# Patient Record
Sex: Female | Born: 1940 | Race: White | Hispanic: No | Marital: Married | Smoking: Former smoker
Health system: Southern US, Community
[De-identification: ages and names within clinical notes are randomized; demographics above are authoritative.]

## PROBLEM LIST (undated history)

## (undated) DIAGNOSIS — A770 Spotted fever due to Rickettsia rickettsii: Secondary | ICD-10-CM

## (undated) DIAGNOSIS — R609 Edema, unspecified: Secondary | ICD-10-CM

## (undated) DIAGNOSIS — M8580 Other specified disorders of bone density and structure, unspecified site: Secondary | ICD-10-CM

## (undated) DIAGNOSIS — L570 Actinic keratosis: Secondary | ICD-10-CM

## (undated) DIAGNOSIS — F419 Anxiety disorder, unspecified: Secondary | ICD-10-CM

## (undated) DIAGNOSIS — M1990 Unspecified osteoarthritis, unspecified site: Secondary | ICD-10-CM

## (undated) DIAGNOSIS — R062 Wheezing: Secondary | ICD-10-CM

## (undated) DIAGNOSIS — Z972 Presence of dental prosthetic device (complete) (partial): Secondary | ICD-10-CM

## (undated) DIAGNOSIS — E559 Vitamin D deficiency, unspecified: Secondary | ICD-10-CM

## (undated) DIAGNOSIS — K219 Gastro-esophageal reflux disease without esophagitis: Secondary | ICD-10-CM

## (undated) DIAGNOSIS — C801 Malignant (primary) neoplasm, unspecified: Secondary | ICD-10-CM

## (undated) DIAGNOSIS — R011 Cardiac murmur, unspecified: Secondary | ICD-10-CM

## (undated) DIAGNOSIS — E876 Hypokalemia: Secondary | ICD-10-CM

## (undated) DIAGNOSIS — F32A Depression, unspecified: Secondary | ICD-10-CM

## (undated) DIAGNOSIS — T7840XA Allergy, unspecified, initial encounter: Secondary | ICD-10-CM

## (undated) DIAGNOSIS — N182 Chronic kidney disease, stage 2 (mild): Secondary | ICD-10-CM

## (undated) DIAGNOSIS — F329 Major depressive disorder, single episode, unspecified: Secondary | ICD-10-CM

## (undated) DIAGNOSIS — R809 Proteinuria, unspecified: Secondary | ICD-10-CM

## (undated) DIAGNOSIS — E785 Hyperlipidemia, unspecified: Secondary | ICD-10-CM

## (undated) DIAGNOSIS — I10 Essential (primary) hypertension: Secondary | ICD-10-CM

## (undated) DIAGNOSIS — R32 Unspecified urinary incontinence: Secondary | ICD-10-CM

## (undated) HISTORY — DX: Proteinuria, unspecified: R80.9

## (undated) HISTORY — DX: Anxiety disorder, unspecified: F41.9

## (undated) HISTORY — DX: Unspecified urinary incontinence: R32

## (undated) HISTORY — DX: Major depressive disorder, single episode, unspecified: F32.9

## (undated) HISTORY — DX: Unspecified osteoarthritis, unspecified site: M19.90

## (undated) HISTORY — DX: Spotted fever due to Rickettsia rickettsii: A77.0

## (undated) HISTORY — DX: Essential (primary) hypertension: I10

## (undated) HISTORY — PX: BLADDER SURGERY: SHX569

## (undated) HISTORY — DX: Vitamin D deficiency, unspecified: E55.9

## (undated) HISTORY — DX: Depression, unspecified: F32.A

## (undated) HISTORY — DX: Allergy, unspecified, initial encounter: T78.40XA

## (undated) HISTORY — DX: Chronic kidney disease, stage 2 (mild): N18.2

## (undated) HISTORY — DX: Hypokalemia: E87.6

## (undated) HISTORY — PX: COLONOSCOPY: SHX174

## (undated) HISTORY — DX: Actinic keratosis: L57.0

## (undated) HISTORY — DX: Hyperlipidemia, unspecified: E78.5

## (undated) HISTORY — DX: Other specified disorders of bone density and structure, unspecified site: M85.80

## (undated) HISTORY — PX: BUNIONECTOMY: SHX129

---

## 2002-08-22 HISTORY — PX: TOTAL ABDOMINAL HYSTERECTOMY: SHX209

## 2002-08-22 HISTORY — PX: ABDOMINAL HYSTERECTOMY: SHX81

## 2003-11-05 ENCOUNTER — Other Ambulatory Visit: Payer: Self-pay

## 2009-10-20 DIAGNOSIS — Z85828 Personal history of other malignant neoplasm of skin: Secondary | ICD-10-CM

## 2009-10-20 HISTORY — DX: Personal history of other malignant neoplasm of skin: Z85.828

## 2010-11-09 ENCOUNTER — Ambulatory Visit: Payer: Self-pay | Admitting: Family Medicine

## 2010-12-02 ENCOUNTER — Ambulatory Visit: Payer: Self-pay | Admitting: Nephrology

## 2011-10-06 DIAGNOSIS — F411 Generalized anxiety disorder: Secondary | ICD-10-CM | POA: Diagnosis not present

## 2011-10-06 DIAGNOSIS — F329 Major depressive disorder, single episode, unspecified: Secondary | ICD-10-CM | POA: Diagnosis not present

## 2011-10-06 DIAGNOSIS — I10 Essential (primary) hypertension: Secondary | ICD-10-CM | POA: Diagnosis not present

## 2011-12-13 DIAGNOSIS — E7800 Pure hypercholesterolemia, unspecified: Secondary | ICD-10-CM | POA: Diagnosis not present

## 2011-12-13 DIAGNOSIS — E119 Type 2 diabetes mellitus without complications: Secondary | ICD-10-CM | POA: Diagnosis not present

## 2011-12-13 DIAGNOSIS — I10 Essential (primary) hypertension: Secondary | ICD-10-CM | POA: Diagnosis not present

## 2011-12-13 DIAGNOSIS — F329 Major depressive disorder, single episode, unspecified: Secondary | ICD-10-CM | POA: Diagnosis not present

## 2011-12-13 DIAGNOSIS — Z0000 Encounter for general adult medical examination without abnormal findings: Secondary | ICD-10-CM | POA: Diagnosis not present

## 2011-12-13 DIAGNOSIS — J309 Allergic rhinitis, unspecified: Secondary | ICD-10-CM | POA: Diagnosis not present

## 2011-12-13 DIAGNOSIS — Z1331 Encounter for screening for depression: Secondary | ICD-10-CM | POA: Diagnosis not present

## 2011-12-16 DIAGNOSIS — M7750 Other enthesopathy of unspecified foot: Secondary | ICD-10-CM | POA: Diagnosis not present

## 2011-12-26 DIAGNOSIS — Z79899 Other long term (current) drug therapy: Secondary | ICD-10-CM | POA: Diagnosis not present

## 2011-12-26 DIAGNOSIS — Z1231 Encounter for screening mammogram for malignant neoplasm of breast: Secondary | ICD-10-CM | POA: Diagnosis not present

## 2011-12-27 DIAGNOSIS — I10 Essential (primary) hypertension: Secondary | ICD-10-CM | POA: Diagnosis not present

## 2012-01-30 DIAGNOSIS — E785 Hyperlipidemia, unspecified: Secondary | ICD-10-CM | POA: Diagnosis not present

## 2012-01-30 DIAGNOSIS — I10 Essential (primary) hypertension: Secondary | ICD-10-CM | POA: Diagnosis not present

## 2012-02-13 DIAGNOSIS — M899 Disorder of bone, unspecified: Secondary | ICD-10-CM | POA: Diagnosis not present

## 2012-02-13 DIAGNOSIS — M999 Biomechanical lesion, unspecified: Secondary | ICD-10-CM | POA: Diagnosis not present

## 2012-02-13 DIAGNOSIS — M949 Disorder of cartilage, unspecified: Secondary | ICD-10-CM | POA: Diagnosis not present

## 2012-02-20 DIAGNOSIS — L259 Unspecified contact dermatitis, unspecified cause: Secondary | ICD-10-CM | POA: Diagnosis not present

## 2012-02-20 DIAGNOSIS — E876 Hypokalemia: Secondary | ICD-10-CM | POA: Diagnosis not present

## 2012-02-21 DIAGNOSIS — H2510 Age-related nuclear cataract, unspecified eye: Secondary | ICD-10-CM | POA: Diagnosis not present

## 2012-04-13 DIAGNOSIS — M7750 Other enthesopathy of unspecified foot: Secondary | ICD-10-CM | POA: Diagnosis not present

## 2012-06-26 DIAGNOSIS — M7750 Other enthesopathy of unspecified foot: Secondary | ICD-10-CM | POA: Diagnosis not present

## 2012-06-28 DIAGNOSIS — R011 Cardiac murmur, unspecified: Secondary | ICD-10-CM | POA: Diagnosis not present

## 2012-07-04 DIAGNOSIS — J029 Acute pharyngitis, unspecified: Secondary | ICD-10-CM | POA: Diagnosis not present

## 2012-07-11 DIAGNOSIS — Z23 Encounter for immunization: Secondary | ICD-10-CM | POA: Diagnosis not present

## 2012-08-31 DIAGNOSIS — M7750 Other enthesopathy of unspecified foot: Secondary | ICD-10-CM | POA: Diagnosis not present

## 2012-10-15 DIAGNOSIS — E785 Hyperlipidemia, unspecified: Secondary | ICD-10-CM | POA: Diagnosis not present

## 2012-10-16 ENCOUNTER — Ambulatory Visit: Payer: Self-pay | Admitting: Family Medicine

## 2012-10-16 DIAGNOSIS — M23302 Other meniscus derangements, unspecified lateral meniscus, unspecified knee: Secondary | ICD-10-CM | POA: Diagnosis not present

## 2012-10-16 DIAGNOSIS — M25569 Pain in unspecified knee: Secondary | ICD-10-CM | POA: Diagnosis not present

## 2012-10-16 DIAGNOSIS — M25669 Stiffness of unspecified knee, not elsewhere classified: Secondary | ICD-10-CM | POA: Diagnosis not present

## 2012-10-16 DIAGNOSIS — M1710 Unilateral primary osteoarthritis, unspecified knee: Secondary | ICD-10-CM | POA: Diagnosis not present

## 2012-10-16 DIAGNOSIS — IMO0002 Reserved for concepts with insufficient information to code with codable children: Secondary | ICD-10-CM | POA: Diagnosis not present

## 2012-11-08 DIAGNOSIS — M1710 Unilateral primary osteoarthritis, unspecified knee: Secondary | ICD-10-CM | POA: Diagnosis not present

## 2012-12-11 DIAGNOSIS — B370 Candidal stomatitis: Secondary | ICD-10-CM | POA: Diagnosis not present

## 2012-12-11 DIAGNOSIS — K140 Glossitis: Secondary | ICD-10-CM | POA: Diagnosis not present

## 2012-12-21 DIAGNOSIS — M779 Enthesopathy, unspecified: Secondary | ICD-10-CM | POA: Diagnosis not present

## 2012-12-21 DIAGNOSIS — M2040 Other hammer toe(s) (acquired), unspecified foot: Secondary | ICD-10-CM | POA: Diagnosis not present

## 2013-01-15 DIAGNOSIS — E785 Hyperlipidemia, unspecified: Secondary | ICD-10-CM | POA: Diagnosis not present

## 2013-01-15 DIAGNOSIS — R5381 Other malaise: Secondary | ICD-10-CM | POA: Diagnosis not present

## 2013-01-15 DIAGNOSIS — Z0000 Encounter for general adult medical examination without abnormal findings: Secondary | ICD-10-CM | POA: Diagnosis not present

## 2013-01-15 DIAGNOSIS — S60949A Unspecified superficial injury of unspecified finger, initial encounter: Secondary | ICD-10-CM | POA: Diagnosis not present

## 2013-01-15 DIAGNOSIS — R5383 Other fatigue: Secondary | ICD-10-CM | POA: Diagnosis not present

## 2013-01-15 DIAGNOSIS — I10 Essential (primary) hypertension: Secondary | ICD-10-CM | POA: Diagnosis not present

## 2013-02-05 DIAGNOSIS — M659 Synovitis and tenosynovitis, unspecified: Secondary | ICD-10-CM | POA: Diagnosis not present

## 2013-02-05 DIAGNOSIS — R609 Edema, unspecified: Secondary | ICD-10-CM | POA: Diagnosis not present

## 2013-02-05 DIAGNOSIS — M79609 Pain in unspecified limb: Secondary | ICD-10-CM | POA: Diagnosis not present

## 2013-04-02 DIAGNOSIS — M1710 Unilateral primary osteoarthritis, unspecified knee: Secondary | ICD-10-CM | POA: Diagnosis not present

## 2013-04-02 DIAGNOSIS — IMO0002 Reserved for concepts with insufficient information to code with codable children: Secondary | ICD-10-CM | POA: Diagnosis not present

## 2013-04-02 DIAGNOSIS — M79609 Pain in unspecified limb: Secondary | ICD-10-CM | POA: Diagnosis not present

## 2013-05-24 DIAGNOSIS — IMO0002 Reserved for concepts with insufficient information to code with codable children: Secondary | ICD-10-CM | POA: Diagnosis not present

## 2013-05-24 DIAGNOSIS — M79609 Pain in unspecified limb: Secondary | ICD-10-CM | POA: Diagnosis not present

## 2013-05-24 DIAGNOSIS — M1710 Unilateral primary osteoarthritis, unspecified knee: Secondary | ICD-10-CM | POA: Diagnosis not present

## 2013-08-12 DIAGNOSIS — J111 Influenza due to unidentified influenza virus with other respiratory manifestations: Secondary | ICD-10-CM | POA: Diagnosis not present

## 2013-08-12 DIAGNOSIS — J40 Bronchitis, not specified as acute or chronic: Secondary | ICD-10-CM | POA: Diagnosis not present

## 2013-08-12 DIAGNOSIS — J209 Acute bronchitis, unspecified: Secondary | ICD-10-CM | POA: Diagnosis not present

## 2013-09-03 DIAGNOSIS — J209 Acute bronchitis, unspecified: Secondary | ICD-10-CM | POA: Diagnosis not present

## 2013-09-03 DIAGNOSIS — J019 Acute sinusitis, unspecified: Secondary | ICD-10-CM | POA: Diagnosis not present

## 2013-09-10 DIAGNOSIS — N8111 Cystocele, midline: Secondary | ICD-10-CM | POA: Diagnosis not present

## 2013-09-10 DIAGNOSIS — R300 Dysuria: Secondary | ICD-10-CM | POA: Diagnosis not present

## 2013-09-10 DIAGNOSIS — R32 Unspecified urinary incontinence: Secondary | ICD-10-CM | POA: Diagnosis not present

## 2013-09-10 DIAGNOSIS — R634 Abnormal weight loss: Secondary | ICD-10-CM | POA: Diagnosis not present

## 2013-09-13 DIAGNOSIS — R634 Abnormal weight loss: Secondary | ICD-10-CM | POA: Diagnosis not present

## 2013-09-13 DIAGNOSIS — K644 Residual hemorrhoidal skin tags: Secondary | ICD-10-CM | POA: Diagnosis not present

## 2013-09-13 DIAGNOSIS — K5909 Other constipation: Secondary | ICD-10-CM | POA: Diagnosis not present

## 2013-09-17 ENCOUNTER — Ambulatory Visit: Payer: Self-pay | Admitting: Family Medicine

## 2013-09-17 DIAGNOSIS — IMO0002 Reserved for concepts with insufficient information to code with codable children: Secondary | ICD-10-CM | POA: Diagnosis not present

## 2013-09-17 DIAGNOSIS — M1710 Unilateral primary osteoarthritis, unspecified knee: Secondary | ICD-10-CM | POA: Diagnosis not present

## 2013-09-17 DIAGNOSIS — R932 Abnormal findings on diagnostic imaging of liver and biliary tract: Secondary | ICD-10-CM | POA: Diagnosis not present

## 2013-09-17 DIAGNOSIS — R5383 Other fatigue: Secondary | ICD-10-CM | POA: Diagnosis not present

## 2013-09-17 DIAGNOSIS — Z20828 Contact with and (suspected) exposure to other viral communicable diseases: Secondary | ICD-10-CM | POA: Diagnosis not present

## 2013-09-17 DIAGNOSIS — R634 Abnormal weight loss: Secondary | ICD-10-CM | POA: Diagnosis not present

## 2013-09-17 DIAGNOSIS — R5381 Other malaise: Secondary | ICD-10-CM | POA: Diagnosis not present

## 2013-09-18 DIAGNOSIS — N393 Stress incontinence (female) (male): Secondary | ICD-10-CM | POA: Diagnosis not present

## 2013-09-18 DIAGNOSIS — N8111 Cystocele, midline: Secondary | ICD-10-CM | POA: Diagnosis not present

## 2013-09-19 ENCOUNTER — Ambulatory Visit: Payer: Self-pay | Admitting: Family Medicine

## 2013-09-19 DIAGNOSIS — K7689 Other specified diseases of liver: Secondary | ICD-10-CM | POA: Diagnosis not present

## 2013-09-19 DIAGNOSIS — R634 Abnormal weight loss: Secondary | ICD-10-CM | POA: Diagnosis not present

## 2013-09-26 DIAGNOSIS — N816 Rectocele: Secondary | ICD-10-CM | POA: Diagnosis not present

## 2013-09-26 DIAGNOSIS — M79609 Pain in unspecified limb: Secondary | ICD-10-CM | POA: Diagnosis not present

## 2013-09-26 DIAGNOSIS — M659 Synovitis and tenosynovitis, unspecified: Secondary | ICD-10-CM | POA: Diagnosis not present

## 2013-09-26 DIAGNOSIS — N8110 Cystocele, unspecified: Secondary | ICD-10-CM | POA: Diagnosis not present

## 2013-11-12 DIAGNOSIS — E785 Hyperlipidemia, unspecified: Secondary | ICD-10-CM | POA: Diagnosis not present

## 2013-11-12 DIAGNOSIS — R634 Abnormal weight loss: Secondary | ICD-10-CM | POA: Diagnosis not present

## 2013-11-12 DIAGNOSIS — I10 Essential (primary) hypertension: Secondary | ICD-10-CM | POA: Diagnosis not present

## 2013-11-28 DIAGNOSIS — H2510 Age-related nuclear cataract, unspecified eye: Secondary | ICD-10-CM | POA: Diagnosis not present

## 2013-12-18 DIAGNOSIS — I10 Essential (primary) hypertension: Secondary | ICD-10-CM | POA: Diagnosis not present

## 2013-12-18 DIAGNOSIS — E785 Hyperlipidemia, unspecified: Secondary | ICD-10-CM | POA: Diagnosis not present

## 2013-12-26 DIAGNOSIS — M2010 Hallux valgus (acquired), unspecified foot: Secondary | ICD-10-CM | POA: Diagnosis not present

## 2013-12-26 DIAGNOSIS — M21619 Bunion of unspecified foot: Secondary | ICD-10-CM | POA: Diagnosis not present

## 2014-01-21 DIAGNOSIS — M21619 Bunion of unspecified foot: Secondary | ICD-10-CM | POA: Diagnosis not present

## 2014-01-21 DIAGNOSIS — M2010 Hallux valgus (acquired), unspecified foot: Secondary | ICD-10-CM | POA: Diagnosis not present

## 2014-01-27 DIAGNOSIS — N182 Chronic kidney disease, stage 2 (mild): Secondary | ICD-10-CM | POA: Diagnosis not present

## 2014-01-27 DIAGNOSIS — Z823 Family history of stroke: Secondary | ICD-10-CM | POA: Diagnosis not present

## 2014-01-27 DIAGNOSIS — M2010 Hallux valgus (acquired), unspecified foot: Secondary | ICD-10-CM | POA: Diagnosis not present

## 2014-01-27 DIAGNOSIS — T148XXA Other injury of unspecified body region, initial encounter: Secondary | ICD-10-CM | POA: Diagnosis not present

## 2014-01-29 ENCOUNTER — Ambulatory Visit: Payer: Self-pay | Admitting: Podiatry

## 2014-01-29 DIAGNOSIS — M2010 Hallux valgus (acquired), unspecified foot: Secondary | ICD-10-CM | POA: Diagnosis not present

## 2014-01-29 DIAGNOSIS — M21619 Bunion of unspecified foot: Secondary | ICD-10-CM | POA: Diagnosis not present

## 2014-01-29 DIAGNOSIS — G8918 Other acute postprocedural pain: Secondary | ICD-10-CM | POA: Diagnosis not present

## 2014-01-29 DIAGNOSIS — Q666 Other congenital valgus deformities of feet: Secondary | ICD-10-CM | POA: Diagnosis not present

## 2014-01-29 DIAGNOSIS — M1990 Unspecified osteoarthritis, unspecified site: Secondary | ICD-10-CM | POA: Diagnosis not present

## 2014-01-29 DIAGNOSIS — I10 Essential (primary) hypertension: Secondary | ICD-10-CM | POA: Diagnosis not present

## 2014-01-29 DIAGNOSIS — Z79899 Other long term (current) drug therapy: Secondary | ICD-10-CM | POA: Diagnosis not present

## 2014-01-29 DIAGNOSIS — Z791 Long term (current) use of non-steroidal anti-inflammatories (NSAID): Secondary | ICD-10-CM | POA: Diagnosis not present

## 2014-02-04 DIAGNOSIS — M79609 Pain in unspecified limb: Secondary | ICD-10-CM | POA: Diagnosis not present

## 2014-02-20 DIAGNOSIS — M79609 Pain in unspecified limb: Secondary | ICD-10-CM | POA: Diagnosis not present

## 2014-03-13 DIAGNOSIS — R609 Edema, unspecified: Secondary | ICD-10-CM | POA: Diagnosis not present

## 2014-03-13 DIAGNOSIS — L259 Unspecified contact dermatitis, unspecified cause: Secondary | ICD-10-CM | POA: Diagnosis not present

## 2014-03-13 DIAGNOSIS — M79609 Pain in unspecified limb: Secondary | ICD-10-CM | POA: Diagnosis not present

## 2014-03-13 DIAGNOSIS — S92309A Fracture of unspecified metatarsal bone(s), unspecified foot, initial encounter for closed fracture: Secondary | ICD-10-CM | POA: Diagnosis not present

## 2014-04-22 ENCOUNTER — Ambulatory Visit: Payer: Self-pay | Admitting: Podiatry

## 2014-04-24 DIAGNOSIS — M899 Disorder of bone, unspecified: Secondary | ICD-10-CM | POA: Diagnosis not present

## 2014-04-24 DIAGNOSIS — N951 Menopausal and female climacteric states: Secondary | ICD-10-CM | POA: Diagnosis not present

## 2014-04-24 DIAGNOSIS — Z13820 Encounter for screening for osteoporosis: Secondary | ICD-10-CM | POA: Diagnosis not present

## 2014-04-24 DIAGNOSIS — M949 Disorder of cartilage, unspecified: Secondary | ICD-10-CM | POA: Diagnosis not present

## 2014-04-24 DIAGNOSIS — E2839 Other primary ovarian failure: Secondary | ICD-10-CM | POA: Diagnosis not present

## 2014-04-25 ENCOUNTER — Ambulatory Visit (INDEPENDENT_AMBULATORY_CARE_PROVIDER_SITE_OTHER): Payer: Medicare Other

## 2014-04-25 ENCOUNTER — Other Ambulatory Visit: Payer: Self-pay | Admitting: *Deleted

## 2014-04-25 ENCOUNTER — Encounter: Payer: Self-pay | Admitting: Podiatry

## 2014-04-25 ENCOUNTER — Ambulatory Visit (INDEPENDENT_AMBULATORY_CARE_PROVIDER_SITE_OTHER): Payer: Medicare Other | Admitting: Podiatry

## 2014-04-25 VITALS — BP 133/79 | HR 79 | Resp 16 | Ht 61.0 in | Wt 140.0 lb

## 2014-04-25 DIAGNOSIS — M21619 Bunion of unspecified foot: Secondary | ICD-10-CM

## 2014-04-25 DIAGNOSIS — R609 Edema, unspecified: Secondary | ICD-10-CM | POA: Diagnosis not present

## 2014-04-25 DIAGNOSIS — S92309A Fracture of unspecified metatarsal bone(s), unspecified foot, initial encounter for closed fracture: Secondary | ICD-10-CM

## 2014-04-25 DIAGNOSIS — M21612 Bunion of left foot: Secondary | ICD-10-CM

## 2014-04-25 NOTE — Progress Notes (Signed)
   Subjective:    Patient ID: Cynthia Vaughan, female    DOB: October 23, 1940, 73 y.o.   MRN: [id number]  HPI Comments: Had bunion surgery about 3 months ago with Dr Vickki Muff and it dont look so well now   Foot Pain      Review of Systems     Objective:   Physical Exam        Assessment & Plan:

## 2014-04-28 NOTE — Progress Notes (Signed)
Subjective:     Patient ID: Cynthia Vaughan, female   DOB: February 16, 1941, 73 y.o.   MRN: [id number]  HPI patient presents with pain in the left foot that has been chronic in nature and has been going on since she had foot surgery 3 months ago by another physician. States that it makes it very hard for her to walk and she feels like she's walking on a broken down   Review of Systems     Objective:   Physical Exam Neurovascular status is unchanged with quite a bit of forefoot edema +2 pitting in nature of the left forefoot and ankle with severe discomfort in the lateral foot and indications of surgery on the first and fifth metatarsal    Assessment:     Probable fracture of the fourth metatarsal left with healing still occurring on the lateral foot    Plan:     H&P and x-rays reviewed. At this time due to the severity of the fracture fourth metatarsal immobilization is necessary and air fracture walker was dispensed. Due to the significant pitting edema I did apply an Haematologist Ace wrap with instructions on wearing it for 4 days and less it bothers her and to reappoint for me to recheck again in 3 weeks earlier if any issues should occur

## 2014-05-16 DIAGNOSIS — J069 Acute upper respiratory infection, unspecified: Secondary | ICD-10-CM | POA: Diagnosis not present

## 2014-05-23 ENCOUNTER — Ambulatory Visit (INDEPENDENT_AMBULATORY_CARE_PROVIDER_SITE_OTHER): Payer: Medicare Other

## 2014-05-23 ENCOUNTER — Encounter: Payer: Self-pay | Admitting: Podiatry

## 2014-05-23 ENCOUNTER — Ambulatory Visit (INDEPENDENT_AMBULATORY_CARE_PROVIDER_SITE_OTHER): Payer: Medicare Other | Admitting: Podiatry

## 2014-05-23 DIAGNOSIS — S92302D Fracture of unspecified metatarsal bone(s), left foot, subsequent encounter for fracture with routine healing: Secondary | ICD-10-CM | POA: Diagnosis not present

## 2014-05-25 NOTE — Progress Notes (Signed)
Subjective:     Patient ID: Cynthia Vaughan, female   DOB: 04-26-1941, 73 y.o.   MRN: [id number]  HPI patient states that my pain has reduced quite nicely on my left and I have still been wearing my boot most of the time   Review of Systems     Objective:   Physical Exam Neurovascular status intact with continued discomfort left fourth metatarsal shaft but improved from previous visit    Assessment:     Improved stress fracture condition left fourth metatarsal    Plan:     Discussed that this was a severe fracture and will take probably another 8-12 weeks to completely heal but I want her to use partial immobilization and don't do any activities that put excessive force on her forefoot

## 2014-06-08 ENCOUNTER — Emergency Department: Payer: Self-pay | Admitting: Emergency Medicine

## 2014-06-08 DIAGNOSIS — F419 Anxiety disorder, unspecified: Secondary | ICD-10-CM | POA: Diagnosis not present

## 2014-06-08 DIAGNOSIS — R209 Unspecified disturbances of skin sensation: Secondary | ICD-10-CM | POA: Diagnosis not present

## 2014-06-08 DIAGNOSIS — R52 Pain, unspecified: Secondary | ICD-10-CM | POA: Diagnosis not present

## 2014-06-08 DIAGNOSIS — R9431 Abnormal electrocardiogram [ECG] [EKG]: Secondary | ICD-10-CM | POA: Diagnosis not present

## 2014-06-08 DIAGNOSIS — R51 Headache: Secondary | ICD-10-CM | POA: Diagnosis not present

## 2014-06-08 LAB — URINALYSIS, COMPLETE
Bacteria: NONE SEEN
Bilirubin,UR: NEGATIVE
Blood: NEGATIVE
Glucose,UR: NEGATIVE mg/dL (ref 0–75)
KETONE: NEGATIVE
Leukocyte Esterase: NEGATIVE
NITRITE: NEGATIVE
PH: 7 (ref 4.5–8.0)
Protein: NEGATIVE
Specific Gravity: 1.005 (ref 1.003–1.030)

## 2014-06-08 LAB — COMPREHENSIVE METABOLIC PANEL
ALBUMIN: 3.2 g/dL — AB (ref 3.4–5.0)
ALK PHOS: 75 U/L
Anion Gap: 9 (ref 7–16)
BUN: 10 mg/dL (ref 7–18)
Bilirubin,Total: 0.3 mg/dL (ref 0.2–1.0)
CALCIUM: 8.2 mg/dL — AB (ref 8.5–10.1)
CO2: 26 mmol/L (ref 21–32)
Chloride: 107 mmol/L (ref 98–107)
Creatinine: 0.73 mg/dL (ref 0.60–1.30)
EGFR (Non-African Amer.): >60
Glucose: 84 mg/dL (ref 65–99)
Osmolality: 281 (ref 275–301)
POTASSIUM: 3.6 mmol/L (ref 3.5–5.1)
SGOT(AST): 31 U/L (ref 15–37)
SGPT (ALT): 38 U/L
SODIUM: 142 mmol/L (ref 136–145)
TOTAL PROTEIN: 6.8 g/dL (ref 6.4–8.2)

## 2014-06-08 LAB — CBC
HCT: 41 % (ref 35.0–47.0)
HGB: 13.8 g/dL (ref 12.0–16.0)
MCH: 32 pg (ref 26.0–34.0)
MCHC: 33.7 g/dL (ref 32.0–36.0)
MCV: 95 fL (ref 80–100)
Platelet: 193 10*3/uL (ref 150–440)
RBC: 4.33 10*6/uL (ref 3.80–5.20)
RDW: 12.9 % (ref 11.5–14.5)
WBC: 7.3 10*3/uL (ref 3.6–11.0)

## 2014-06-08 LAB — TROPONIN I

## 2014-06-11 DIAGNOSIS — M79672 Pain in left foot: Secondary | ICD-10-CM | POA: Diagnosis not present

## 2014-06-11 DIAGNOSIS — Z23 Encounter for immunization: Secondary | ICD-10-CM | POA: Diagnosis not present

## 2014-06-11 DIAGNOSIS — F411 Generalized anxiety disorder: Secondary | ICD-10-CM | POA: Diagnosis not present

## 2014-09-29 DIAGNOSIS — E785 Hyperlipidemia, unspecified: Secondary | ICD-10-CM | POA: Diagnosis not present

## 2014-09-29 DIAGNOSIS — N182 Chronic kidney disease, stage 2 (mild): Secondary | ICD-10-CM | POA: Diagnosis not present

## 2014-09-29 DIAGNOSIS — E559 Vitamin D deficiency, unspecified: Secondary | ICD-10-CM | POA: Diagnosis not present

## 2014-09-29 DIAGNOSIS — I129 Hypertensive chronic kidney disease with stage 1 through stage 4 chronic kidney disease, or unspecified chronic kidney disease: Secondary | ICD-10-CM | POA: Diagnosis not present

## 2014-09-29 DIAGNOSIS — G629 Polyneuropathy, unspecified: Secondary | ICD-10-CM | POA: Diagnosis not present

## 2014-09-29 DIAGNOSIS — M1990 Unspecified osteoarthritis, unspecified site: Secondary | ICD-10-CM | POA: Diagnosis not present

## 2014-09-29 DIAGNOSIS — E538 Deficiency of other specified B group vitamins: Secondary | ICD-10-CM | POA: Diagnosis not present

## 2014-10-21 DIAGNOSIS — R202 Paresthesia of skin: Secondary | ICD-10-CM | POA: Insufficient documentation

## 2014-11-13 DIAGNOSIS — J029 Acute pharyngitis, unspecified: Secondary | ICD-10-CM | POA: Diagnosis not present

## 2014-11-13 DIAGNOSIS — J069 Acute upper respiratory infection, unspecified: Secondary | ICD-10-CM | POA: Diagnosis not present

## 2014-12-29 DIAGNOSIS — I10 Essential (primary) hypertension: Secondary | ICD-10-CM | POA: Diagnosis not present

## 2014-12-29 DIAGNOSIS — E559 Vitamin D deficiency, unspecified: Secondary | ICD-10-CM | POA: Diagnosis not present

## 2014-12-29 DIAGNOSIS — E041 Nontoxic single thyroid nodule: Secondary | ICD-10-CM | POA: Diagnosis not present

## 2014-12-29 DIAGNOSIS — J020 Streptococcal pharyngitis: Secondary | ICD-10-CM | POA: Diagnosis not present

## 2014-12-29 DIAGNOSIS — N182 Chronic kidney disease, stage 2 (mild): Secondary | ICD-10-CM | POA: Diagnosis not present

## 2014-12-29 DIAGNOSIS — R801 Persistent proteinuria, unspecified: Secondary | ICD-10-CM | POA: Diagnosis not present

## 2014-12-29 DIAGNOSIS — R946 Abnormal results of thyroid function studies: Secondary | ICD-10-CM | POA: Diagnosis not present

## 2014-12-30 ENCOUNTER — Other Ambulatory Visit: Payer: Self-pay | Admitting: Family Medicine

## 2014-12-30 DIAGNOSIS — R7989 Other specified abnormal findings of blood chemistry: Secondary | ICD-10-CM

## 2014-12-30 DIAGNOSIS — R1310 Dysphagia, unspecified: Secondary | ICD-10-CM

## 2015-01-01 ENCOUNTER — Ambulatory Visit: Payer: PRIVATE HEALTH INSURANCE

## 2015-01-02 ENCOUNTER — Other Ambulatory Visit: Payer: Self-pay | Admitting: Family Medicine

## 2015-01-02 ENCOUNTER — Ambulatory Visit
Admission: RE | Admit: 2015-01-02 | Discharge: 2015-01-02 | Disposition: A | Discharge status: Home / Self Care | Payer: Medicare Other | Source: Ambulatory Visit | Attending: Family Medicine | Admitting: Family Medicine

## 2015-01-02 ENCOUNTER — Ambulatory Visit: Payer: Medicare Other

## 2015-01-02 DIAGNOSIS — R600 Localized edema: Secondary | ICD-10-CM | POA: Diagnosis not present

## 2015-01-02 DIAGNOSIS — I509 Heart failure, unspecified: Secondary | ICD-10-CM | POA: Diagnosis not present

## 2015-01-02 DIAGNOSIS — R0602 Shortness of breath: Secondary | ICD-10-CM

## 2015-01-06 ENCOUNTER — Other Ambulatory Visit: Payer: Self-pay | Admitting: Family Medicine

## 2015-01-06 DIAGNOSIS — R1310 Dysphagia, unspecified: Secondary | ICD-10-CM

## 2015-01-06 DIAGNOSIS — R7989 Other specified abnormal findings of blood chemistry: Secondary | ICD-10-CM

## 2015-01-06 DIAGNOSIS — R221 Localized swelling, mass and lump, neck: Secondary | ICD-10-CM

## 2015-01-07 ENCOUNTER — Inpatient Hospital Stay: Admission: RE | Admit: 2015-01-07 | Payer: PRIVATE HEALTH INSURANCE | Source: Ambulatory Visit

## 2015-01-07 ENCOUNTER — Ambulatory Visit: Admission: RE | Admit: 2015-01-07 | Payer: PRIVATE HEALTH INSURANCE | Source: Ambulatory Visit

## 2015-01-20 DIAGNOSIS — E785 Hyperlipidemia, unspecified: Secondary | ICD-10-CM | POA: Diagnosis not present

## 2015-01-20 DIAGNOSIS — M7989 Other specified soft tissue disorders: Secondary | ICD-10-CM | POA: Diagnosis not present

## 2015-01-20 DIAGNOSIS — I10 Essential (primary) hypertension: Secondary | ICD-10-CM | POA: Diagnosis not present

## 2015-01-20 DIAGNOSIS — M79609 Pain in unspecified limb: Secondary | ICD-10-CM | POA: Diagnosis not present

## 2015-01-22 ENCOUNTER — Other Ambulatory Visit: Payer: Self-pay

## 2015-01-22 MED ORDER — ATORVASTATIN CALCIUM 40 MG PO TABS: 40.0000 mg | ORAL_TABLET | Freq: Every day | ORAL | Status: DC

## 2015-01-22 NOTE — Telephone Encounter (Signed)
Last SGPT was 19 on Sep 29, 2014

## 2015-02-19 ENCOUNTER — Other Ambulatory Visit: Payer: Self-pay | Admitting: Family Medicine

## 2015-02-19 MED ORDER — LOSARTAN POTASSIUM 50 MG PO TABS: 50.0000 mg | ORAL_TABLET | Freq: Every day | ORAL | Status: DC

## 2015-02-19 NOTE — Telephone Encounter (Signed)
E-Fax came through for refill: Rx: losartan (COZAAR) 25 MG tablet Rx copy on basket

## 2015-02-19 NOTE — Telephone Encounter (Signed)
Routing to provider  

## 2015-03-03 DIAGNOSIS — F329 Major depressive disorder, single episode, unspecified: Secondary | ICD-10-CM | POA: Insufficient documentation

## 2015-03-03 DIAGNOSIS — E559 Vitamin D deficiency, unspecified: Secondary | ICD-10-CM | POA: Insufficient documentation

## 2015-03-03 DIAGNOSIS — I129 Hypertensive chronic kidney disease with stage 1 through stage 4 chronic kidney disease, or unspecified chronic kidney disease: Secondary | ICD-10-CM | POA: Insufficient documentation

## 2015-03-03 DIAGNOSIS — E785 Hyperlipidemia, unspecified: Secondary | ICD-10-CM | POA: Insufficient documentation

## 2015-03-03 DIAGNOSIS — N182 Chronic kidney disease, stage 2 (mild): Secondary | ICD-10-CM | POA: Insufficient documentation

## 2015-03-03 DIAGNOSIS — R32 Unspecified urinary incontinence: Secondary | ICD-10-CM | POA: Insufficient documentation

## 2015-03-03 DIAGNOSIS — F32A Depression, unspecified: Secondary | ICD-10-CM | POA: Insufficient documentation

## 2015-03-03 DIAGNOSIS — F419 Anxiety disorder, unspecified: Secondary | ICD-10-CM | POA: Insufficient documentation

## 2015-03-03 DIAGNOSIS — M1990 Unspecified osteoarthritis, unspecified site: Secondary | ICD-10-CM | POA: Insufficient documentation

## 2015-03-03 DIAGNOSIS — M8580 Other specified disorders of bone density and structure, unspecified site: Secondary | ICD-10-CM | POA: Insufficient documentation

## 2015-03-03 DIAGNOSIS — R809 Proteinuria, unspecified: Secondary | ICD-10-CM | POA: Insufficient documentation

## 2015-03-03 DIAGNOSIS — T7840XA Allergy, unspecified, initial encounter: Secondary | ICD-10-CM | POA: Insufficient documentation

## 2015-03-03 DIAGNOSIS — E876 Hypokalemia: Secondary | ICD-10-CM | POA: Insufficient documentation

## 2015-03-04 IMAGING — CR DG CHEST 2V
1 series · 2 of 2 positions shown · non-contrast
Comparison: None.

CLINICAL DATA: Weight loss.

EXAM:
CHEST  2 VIEW

[Series 1: pa · 0.17mm/px · 2 of 2 slices shown]
[im 1/2]
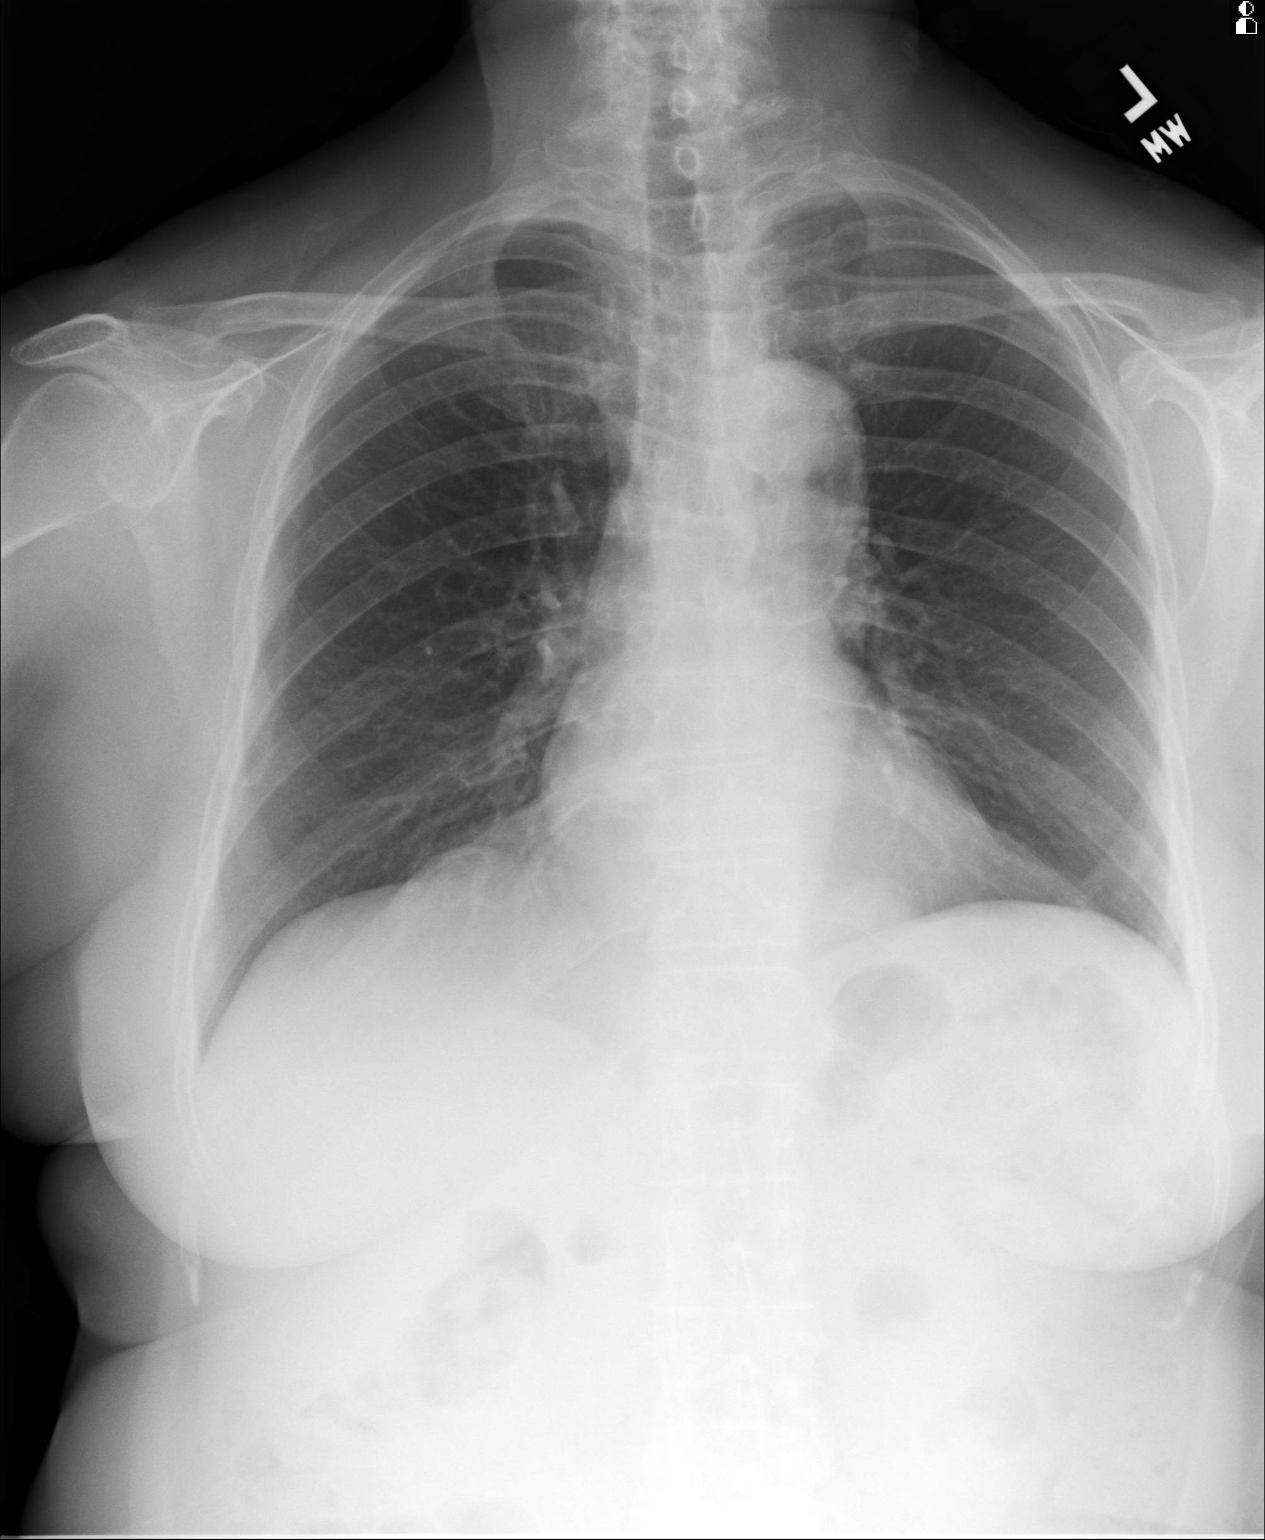
[im 2/2]
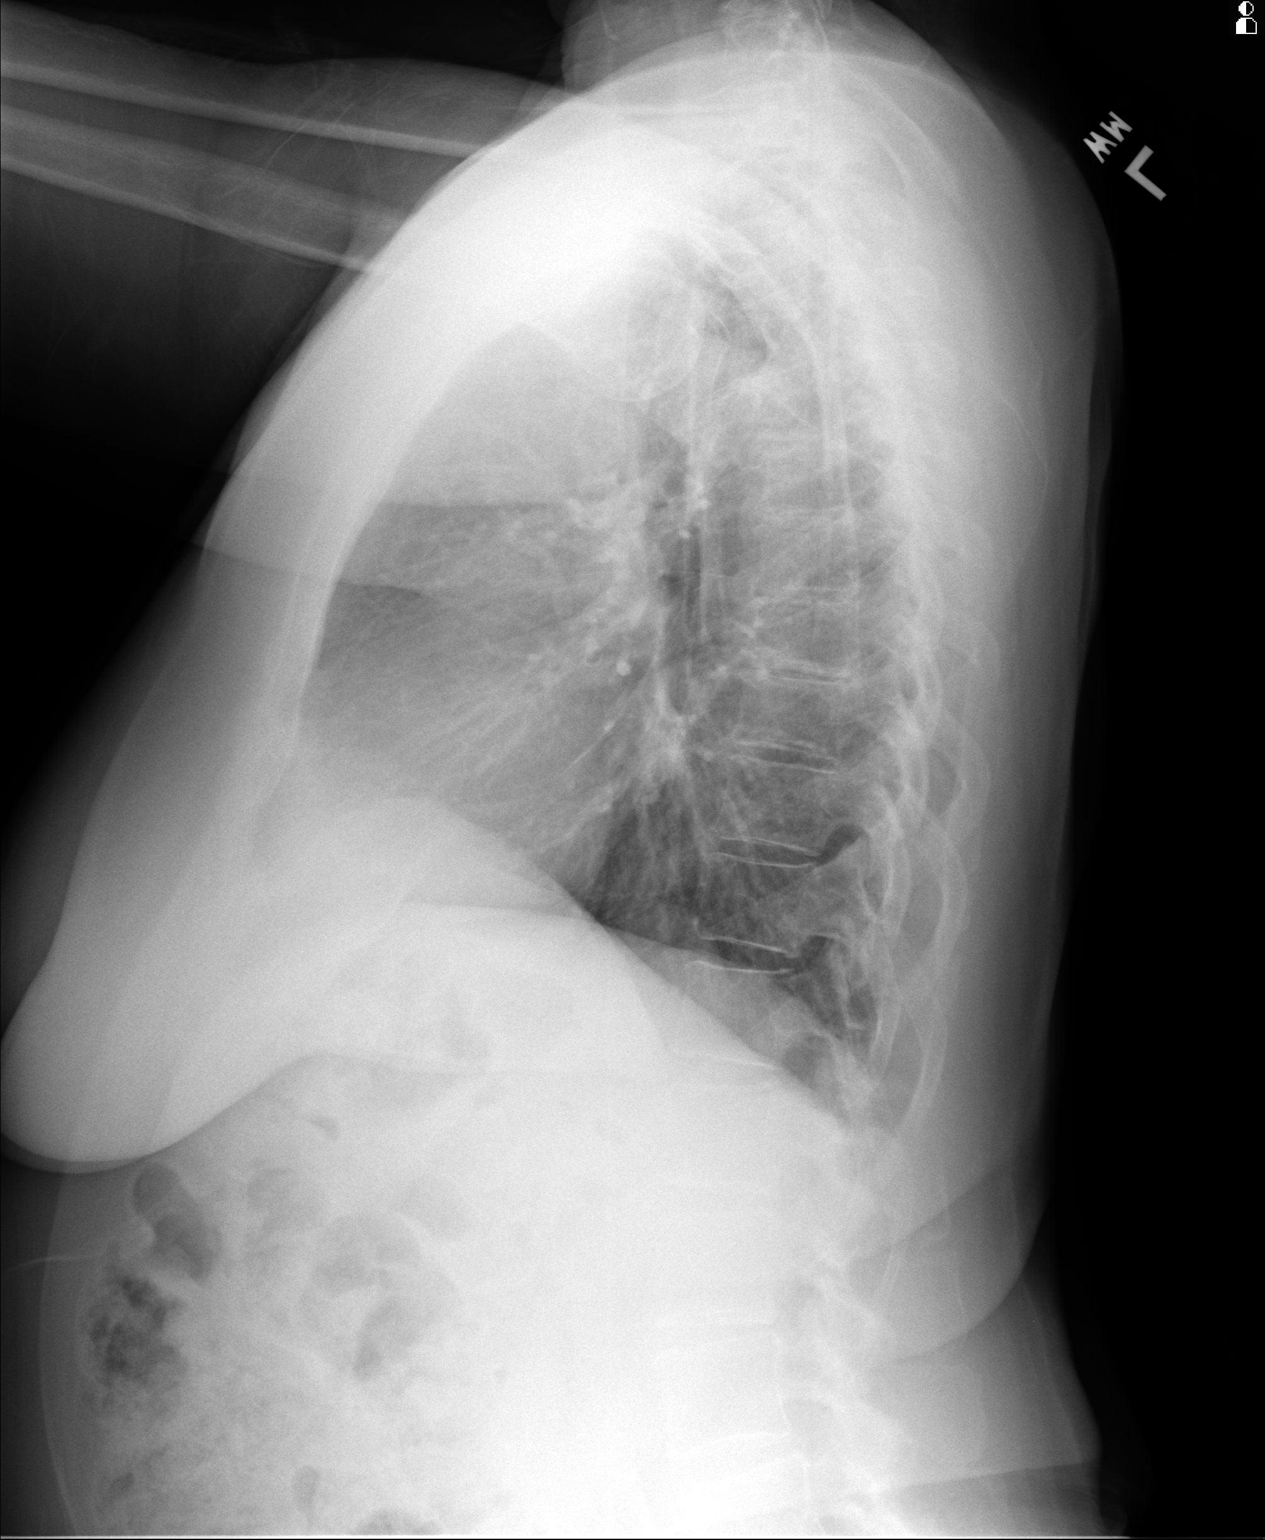

[2 of 2 positions shown; findings below may reference images not displayed]

FINDINGS: Mediastinum and hilar structures are normal. The lungs are clear. No
pleural effusion or pneumothorax. No acute osseous abnormality.
IMPRESSION: No active cardiopulmonary disease.

## 2015-03-05 ENCOUNTER — Ambulatory Visit: Payer: Self-pay | Admitting: Family Medicine

## 2015-04-12 ENCOUNTER — Other Ambulatory Visit: Payer: Self-pay | Admitting: Family Medicine

## 2015-04-23 ENCOUNTER — Ambulatory Visit (INDEPENDENT_AMBULATORY_CARE_PROVIDER_SITE_OTHER): Payer: Medicare Other | Admitting: Family Medicine

## 2015-04-23 ENCOUNTER — Encounter: Payer: Self-pay | Admitting: Family Medicine

## 2015-04-23 VITALS — BP 144/84 | HR 94 | Temp 97.9°F | Ht 59.0 in | Wt 151.0 lb

## 2015-04-23 DIAGNOSIS — Z5181 Encounter for therapeutic drug level monitoring: Secondary | ICD-10-CM | POA: Diagnosis not present

## 2015-04-23 DIAGNOSIS — R809 Proteinuria, unspecified: Secondary | ICD-10-CM

## 2015-04-23 DIAGNOSIS — E559 Vitamin D deficiency, unspecified: Secondary | ICD-10-CM | POA: Diagnosis not present

## 2015-04-23 DIAGNOSIS — J312 Chronic pharyngitis: Secondary | ICD-10-CM | POA: Insufficient documentation

## 2015-04-23 DIAGNOSIS — E785 Hyperlipidemia, unspecified: Secondary | ICD-10-CM | POA: Diagnosis not present

## 2015-04-23 DIAGNOSIS — F419 Anxiety disorder, unspecified: Secondary | ICD-10-CM | POA: Diagnosis not present

## 2015-04-23 DIAGNOSIS — R062 Wheezing: Secondary | ICD-10-CM | POA: Insufficient documentation

## 2015-04-23 DIAGNOSIS — M79672 Pain in left foot: Secondary | ICD-10-CM | POA: Insufficient documentation

## 2015-04-23 MED ORDER — BUSPIRONE HCL 15 MG PO TABS: 15.0000 mg | ORAL_TABLET | Freq: Two times a day (BID) | ORAL | Status: DC

## 2015-04-23 MED ORDER — MAGIC MOUTHWASH: 10.0000 mL | Freq: Four times a day (QID) | ORAL | Status: DC | PRN

## 2015-04-23 MED ORDER — OMEPRAZOLE 20 MG PO CPDR: 20.0000 mg | DELAYED_RELEASE_CAPSULE | Freq: Two times a day (BID) | ORAL | Status: DC

## 2015-04-23 NOTE — Assessment & Plan Note (Signed)
Recheck urine today  

## 2015-04-23 NOTE — Progress Notes (Signed)
BP 144/84 mmHg  Pulse 94  Temp(Src) 97.9 F (36.6 C)  Ht 4\' 11"  (1.499 m)  Wt 151 lb (68.493 kg)  BMI 30.48 kg/m2  SpO2 94%   Subjective:    Patient ID: Cynthia Vaughan, female    DOB: 01-26-1941, 74 y.o.   MRN: [id number]  HPI: Cynthia Vaughan is a 74 y.o. female  Chief Complaint  Patient presents with  . Sore Throat  . OTHER    "nerves"   She says the sore throat is still there, off and on, for a few weeks now; had strep throat not long ago; does not think it came back; some better; no runny nose; she can cough up a little bit of junk she says; at night, she is wheezing; wakes herself up at night sometimes; some labored breathing; does not get enough air sometimes she says; has an inhaler; she would like some cough syrup, it's tastes like orange; [employer] mouthwash; hurts to swallow; she used to smoke, quit about 25 or more years ago; smoked 2 packs a week for about 10 years; no swollen glands in the neck; she does have some reflux, taking omeprazole; no smokers in the house; her last dose of Advair was Monday;   She feels real nervous; she has stopped the hot flash pills; she went several months ago to the ER for anxiety; they gave her some medicine, but she decided to not take them; she does take something for her anxiety  She says her foot is "horrible"; all of them look broke and stiff she says; she is not going back to the doctor who did the surgery; she had a bunion; the middle toe on the left foot is what is painful; her 2nd and 3rd toes don't line up; the "nerves are all messed up"; she has tingling in all five toes, all the way across; "they don't move hardly"  Relevant past medical, surgical, family and social history reviewed and updated as indicated. Interim medical history since our last visit reviewed. Allergies and medications reviewed and updated.  Review of Systems  Per HPI unless specifically indicated above     Objective:    BP 144/84 mmHg  Pulse 94   Temp(Src) 97.9 F (36.6 C)  Ht 4\' 11"  (1.499 m)  Wt 151 lb (68.493 kg)  BMI 30.48 kg/m2  SpO2 94%  Wt Readings from Last 3 Encounters:  04/23/15 151 lb (68.493 kg)  12/29/14 149 lb (67.586 kg)  04/25/14 140 lb (63.504 kg)    Physical Exam  Constitutional: She appears well-developed and well-nourished. No distress.  HENT:  Head: Normocephalic and atraumatic.  Right Ear: Hearing, tympanic membrane and external ear normal.  Left Ear: Hearing, tympanic membrane and external ear normal.  Nose: No mucosal edema or rhinorrhea. No epistaxis.  Mouth/Throat: Mucous membranes are not pale, not dry and not cyanotic. No oral lesions. No dental abscesses or dental caries. Posterior oropharyngeal erythema (mild) present.  Eyes: EOM are normal. No scleral icterus.  Neck: No thyromegaly present.  Cardiovascular: Normal rate, regular rhythm and normal heart sounds.   No murmur heard. Pulmonary/Chest: Effort normal and breath sounds normal. No respiratory distress. She has no wheezes.  Abdominal: Soft. Bowel sounds are normal. She exhibits no distension.  Musculoskeletal: Normal range of motion. She exhibits no edema.       Left foot: There is deformity (lateral deviation of middle toe left foot). There is no swelling.  Neurological: She is alert. She exhibits normal  muscle tone.  Skin: Skin is warm and dry. She is not diaphoretic. No pallor.  Psychiatric: She has a normal mood and affect. Her behavior is normal. Judgment and thought content normal.      Assessment & Plan:   Problem List Items Addressed This Visit      Respiratory   Chronic pharyngitis - Primary    Refer to ENT; discussed DDX; increase omeprazole to BID; she denies using Advair so thrush less likely; will use Magic mouthwash to see if this gives some relief; check CBC to r/o elevated white count      Relevant Orders   CBC with Differential/Platelet (Completed)   Ambulatory referral to ENT     Other   Anxiety    I am not  going to prescribe a benzo for this patient; I will increase the buspirone; continue the SSRI; counseling would be helpful; return for f/u in 4 weeks      Relevant Medications   busPIRone (BUSPAR) 15 MG tablet   Hyperlipidemia    Check lipids today; not truly fasting (sliced ham with mayo with wheat bread); limit saturated fats, limit eggs to no more than 3 per week; whole grains and fiber will help lower cholesterol      Relevant Orders   Lipid Panel w/o Chol/HDL Ratio (Completed)   Vitamin D deficiency disease    Last check in May showed this was back to normal; continue 1000 iu vit D3 daily      Microalbuminuria    Recheck urine today      Relevant Orders   Microalbumin / creatinine urine ratio (Completed)   Left foot pain   Relevant Orders   Ambulatory referral to Podiatry   Wheezing symptom    No evidence today for COPD or asthma, but symptoms suggest some bronchospasm; increase PPI      Relevant Orders   Spirometry with graph (Completed)   Spirometry with graph    Other Visit Diagnoses    Medication monitoring encounter        Relevant Orders    Comprehensive metabolic panel (Completed)       Follow up plan: Return in about 4 weeks (around 05/21/2015) for anxiety and sore throat.  An after-visit summary was printed and given to the patient at [place].  Please see the patient instructions which may contain other information and recommendations beyond what is mentioned above in the assessment and plan.  Orders Placed This Encounter  Procedures  . Comprehensive metabolic panel  . Lipid Panel w/o Chol/HDL Ratio  . Microalbumin / creatinine urine ratio  . CBC with Differential/Platelet  . Ambulatory referral to Podiatry  . Ambulatory referral to ENT  . Spirometry with graph  . Spirometry with graph   Meds ordered this encounter  Medications  . gabapentin (NEURONTIN) 100 MG capsule    Sig: Take 100 mg by mouth daily.  . Cholecalciferol (VITAMIN D-3 PO)     Sig: Take by mouth daily.  . busPIRone (BUSPAR) 15 MG tablet    Sig: Take 1 tablet (15 mg total) by mouth 2 (two) times daily.    Dispense:  60 tablet    Refill:  5    Pharmacist -- increasing dose from 10 mg to 15 mg BID  . omeprazole (PRILOSEC) 20 MG capsule    Sig: Take 1 capsule (20 mg total) by mouth 2 (two) times daily before a meal.    Dispense:  60 capsule    Refill:  1  .  magic mouthwash SOLN    Sig: Take 10 mLs by mouth 4 (four) times daily as needed for mouth pain. Swish for 20 seconds and then spit out    Dispense:  240 mL    Refill:  0

## 2015-04-23 NOTE — Assessment & Plan Note (Signed)
Refer to ENT; discussed DDX; increase omeprazole to BID; she denies using Advair so thrush less likely; will use Magic mouthwash to see if this gives some relief; check CBC to r/o elevated white count

## 2015-04-23 NOTE — Assessment & Plan Note (Signed)
Last check in May showed this was back to normal; continue 1000 iu vit D3 daily

## 2015-04-23 NOTE — Patient Instructions (Signed)
Increase your omeprazole to TWICE a day Increase your buspirone from 10 mg twice a day to 15 mg twice a day; I already sent a new prescription to your pharmacy for this We'll have you start the mouthwash We'll refer you back to the ear nose throat doctor Return in 4 weeks for follow-up of anxiety and your throat Limit saturated fats

## 2015-04-23 NOTE — Assessment & Plan Note (Signed)
No evidence today for COPD or asthma, but symptoms suggest some bronchospasm; increase PPI

## 2015-04-23 NOTE — Assessment & Plan Note (Signed)
Check lipids today; not truly fasting (sliced ham with mayo with wheat bread); limit saturated fats, limit eggs to no more than 3 per week; whole grains and fiber will help lower cholesterol

## 2015-04-24 LAB — CBC WITH DIFFERENTIAL/PLATELET
BASOS ABS: 0 10*3/uL (ref 0.0–0.2)
Basos: 0 %
EOS (ABSOLUTE): 0.5 10*3/uL — AB (ref 0.0–0.4)
Eos: 6 %
Hematocrit: 41.7 % (ref 34.0–46.6)
Hemoglobin: 13.9 g/dL (ref 11.1–15.9)
IMMATURE GRANULOCYTES: 0 %
Immature Grans (Abs): 0 10*3/uL (ref 0.0–0.1)
LYMPHS ABS: 2.3 10*3/uL (ref 0.7–3.1)
Lymphs: 29 %
MCH: 30.6 pg (ref 26.6–33.0)
MCHC: 33.3 g/dL (ref 31.5–35.7)
MCV: 92 fL (ref 79–97)
MONOS ABS: 0.7 10*3/uL (ref 0.1–0.9)
Monocytes: 9 %
NEUTROS PCT: 56 %
Neutrophils Absolute: 4.3 10*3/uL (ref 1.4–7.0)
PLATELETS: 216 10*3/uL (ref 150–379)
RBC: 4.54 x10E6/uL (ref 3.77–5.28)
RDW: 13 % (ref 12.3–15.4)
WBC: 7.9 10*3/uL (ref 3.4–10.8)

## 2015-04-24 LAB — LIPID PANEL W/O CHOL/HDL RATIO
CHOLESTEROL TOTAL: 212 mg/dL — AB (ref 100–199)
HDL: 69 mg/dL (ref >39)
LDL CALC: 109 mg/dL — AB (ref 0–99)
TRIGLYCERIDES: 170 mg/dL — AB (ref 0–149)
VLDL CHOLESTEROL CAL: 34 mg/dL (ref 5–40)

## 2015-04-24 LAB — COMPREHENSIVE METABOLIC PANEL WITH GFR
ALT: 28 IU/L (ref 0–32)
AST: 21 IU/L (ref 0–40)
Albumin/Globulin Ratio: 1.9 (ref 1.1–2.5)
Albumin: 4.5 g/dL (ref 3.5–4.8)
Alkaline Phosphatase: 62 IU/L (ref 39–117)
BUN/Creatinine Ratio: 21 (ref 11–26)
BUN: 16 mg/dL (ref 8–27)
Bilirubin Total: 0.3 mg/dL (ref 0.0–1.2)
CO2: 28 mmol/L (ref 18–29)
Calcium: 9.6 mg/dL (ref 8.7–10.3)
Chloride: 100 mmol/L (ref 97–108)
Creatinine, Ser: 0.78 mg/dL (ref 0.57–1.00)
GFR calc Af Amer: 87 mL/min/1.73
GFR calc non Af Amer: 76 mL/min/1.73
Globulin, Total: 2.4 g/dL (ref 1.5–4.5)
Glucose: 102 mg/dL — ABNORMAL HIGH (ref 65–99)
Potassium: 4.2 mmol/L (ref 3.5–5.2)
Sodium: 140 mmol/L (ref 134–144)
Total Protein: 6.9 g/dL (ref 6.0–8.5)

## 2015-04-24 LAB — MICROALBUMIN / CREATININE URINE RATIO
Creatinine, Urine: 90 mg/dL
MICROALB/CREAT RATIO: 137.1 mg/g{creat} — ABNORMAL HIGH (ref 0.0–30.0)
Microalbumin, Urine: 123.4 ug/mL

## 2015-04-24 NOTE — Assessment & Plan Note (Signed)
I am not going to prescribe a benzo for this patient; I will increase the buspirone; continue the SSRI; counseling would be helpful; return for f/u in 4 weeks

## 2015-04-24 NOTE — Assessment & Plan Note (Deleted)
I am not going to prescribe a benzo for this patient; I will increase the buspirone; continue the SSRI; counseling would be helpful; return for f/u in 4 weeks

## 2015-04-28 ENCOUNTER — Encounter: Payer: Self-pay | Admitting: Family Medicine

## 2015-04-29 ENCOUNTER — Ambulatory Visit: Payer: Medicare Other | Admitting: Podiatry

## 2015-04-30 ENCOUNTER — Telehealth: Payer: Self-pay | Admitting: Family Medicine

## 2015-04-30 MED ORDER — MAGIC MOUTHWASH: 10.0000 mL | Freq: Four times a day (QID) | ORAL | Status: DC | PRN

## 2015-04-30 NOTE — Telephone Encounter (Signed)
Forward to provider

## 2015-04-30 NOTE — Telephone Encounter (Signed)
Pt called in to get refill on cough syrup/mouth wash called in to [facility]

## 2015-05-04 ENCOUNTER — Ambulatory Visit (INDEPENDENT_AMBULATORY_CARE_PROVIDER_SITE_OTHER): Payer: Medicare Other | Admitting: Podiatry

## 2015-05-04 ENCOUNTER — Encounter: Payer: Self-pay | Admitting: Podiatry

## 2015-05-04 ENCOUNTER — Ambulatory Visit (INDEPENDENT_AMBULATORY_CARE_PROVIDER_SITE_OTHER): Payer: Medicare Other

## 2015-05-04 ENCOUNTER — Other Ambulatory Visit: Payer: Self-pay | Admitting: Podiatry

## 2015-05-04 VITALS — BP 145/78 | HR 68 | Resp 16

## 2015-05-04 DIAGNOSIS — M778 Other enthesopathies, not elsewhere classified: Secondary | ICD-10-CM

## 2015-05-04 DIAGNOSIS — M6789 Other specified disorders of synovium and tendon, multiple sites: Secondary | ICD-10-CM

## 2015-05-04 DIAGNOSIS — M7752 Other enthesopathy of left foot: Secondary | ICD-10-CM

## 2015-05-04 DIAGNOSIS — M779 Enthesopathy, unspecified: Secondary | ICD-10-CM

## 2015-05-04 DIAGNOSIS — M2012 Hallux valgus (acquired), left foot: Secondary | ICD-10-CM | POA: Diagnosis not present

## 2015-05-04 DIAGNOSIS — M79676 Pain in unspecified toe(s): Secondary | ICD-10-CM

## 2015-05-04 DIAGNOSIS — M76829 Posterior tibial tendinitis, unspecified leg: Secondary | ICD-10-CM

## 2015-05-04 DIAGNOSIS — M79674 Pain in right toe(s): Secondary | ICD-10-CM

## 2015-05-04 DIAGNOSIS — M19079 Primary osteoarthritis, unspecified ankle and foot: Secondary | ICD-10-CM | POA: Diagnosis not present

## 2015-05-04 DIAGNOSIS — Q6652 Congenital pes planus, left foot: Secondary | ICD-10-CM

## 2015-05-04 NOTE — Progress Notes (Signed)
She presents today with chief complaint of painful E stiff toes to the second third digit of the left foot. She states that her left foot appears to be laying down more flat than it has been in the past she's also concerned about pain to the right foot as well.  Objective: Vital signs are stable she is alert and oriented 3. Pulses are strongly palpable. Mild hypertension today is noted. Neurologic sensorium is intact for Sims was C monofilament. Deep tendon reflexes are intact. Muscle strength +5 over 5 dorsiflexion plantar flexors and inverters everters on physical musculatures intact. Orthopedic evaluation demonstrates all joints distally for range of motion without crepitation. She has mild to moderate osteoarthritic changes to the lesser digits bilaterally. She has pain on range of motion and on palpation third metatarsophalangeal joint of the left foot. Cutaneous evaluation demonstrates supple well-hydrated cutis no erythema edema cellulitis drainage or odor. Radiographs taken today demonstrate osteoarthritic changes of the lesser digits. And of the third metatarsophalangeal joint of the left foot. She has moderate to severe pes planus with posterior tibial tendon dysfunction left.  Assessment: Posterior tibial tendon dysfunction left. Capsulitis third metatarsophalangeal joint left. 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  Plan: Discussed etiology pathology conservative versus surgical therapies injected around the third metatarsophalangeal joint of the left foot. I also had her scan for set of orthotics with a metatarsal bar to offload her forefoot.  Roselind Messier DPM

## 2015-05-06 DIAGNOSIS — J029 Acute pharyngitis, unspecified: Secondary | ICD-10-CM | POA: Diagnosis not present

## 2015-05-06 DIAGNOSIS — R070 Pain in throat: Secondary | ICD-10-CM | POA: Diagnosis not present

## 2015-05-06 DIAGNOSIS — J301 Allergic rhinitis due to pollen: Secondary | ICD-10-CM | POA: Diagnosis not present

## 2015-05-06 DIAGNOSIS — J387 Other diseases of larynx: Secondary | ICD-10-CM | POA: Diagnosis not present

## 2015-05-10 ENCOUNTER — Other Ambulatory Visit: Payer: Self-pay | Admitting: Family Medicine

## 2015-05-14 ENCOUNTER — Other Ambulatory Visit: Payer: Self-pay | Admitting: Family Medicine

## 2015-05-14 NOTE — Telephone Encounter (Signed)
Routing to provider  

## 2015-05-14 NOTE — Telephone Encounter (Signed)
Last labs reviewed

## 2015-05-21 ENCOUNTER — Ambulatory Visit (INDEPENDENT_AMBULATORY_CARE_PROVIDER_SITE_OTHER): Payer: Medicare Other | Admitting: Family Medicine

## 2015-05-21 ENCOUNTER — Encounter: Payer: Self-pay | Admitting: Family Medicine

## 2015-05-21 VITALS — BP 146/84 | HR 75 | Temp 97.8°F | Wt 152.0 lb

## 2015-05-21 DIAGNOSIS — F419 Anxiety disorder, unspecified: Secondary | ICD-10-CM | POA: Diagnosis not present

## 2015-05-21 DIAGNOSIS — N182 Chronic kidney disease, stage 2 (mild): Secondary | ICD-10-CM | POA: Diagnosis not present

## 2015-05-21 DIAGNOSIS — J312 Chronic pharyngitis: Secondary | ICD-10-CM

## 2015-05-21 DIAGNOSIS — Z23 Encounter for immunization: Secondary | ICD-10-CM

## 2015-05-21 DIAGNOSIS — R809 Proteinuria, unspecified: Secondary | ICD-10-CM

## 2015-05-21 MED ORDER — BUSPIRONE HCL 15 MG PO TABS: ORAL_TABLET | ORAL | Status: DC

## 2015-05-21 NOTE — Progress Notes (Signed)
BP 146/84 mmHg  Pulse 75  Temp(Src) 97.8 F (36.6 C)  Wt 152 lb (68.947 kg)  SpO2 95%   Subjective:    Patient ID: Cynthia Vaughan, female    DOB: 09/18/1940, 74 y.o.   MRN: [id number]  HPI: Cynthia Vaughan is a 74 y.o. female  Chief Complaint  Patient presents with  . Depression    following up on Buspirone increase   She is here for f/u of anxiety and sore throat  Her last visit with me was on Sept 1st and we continued her SSRI and increased her buspirone; however, she is taking sertraline but doesn't think she is taking the buspirone; she has been on the sertraline for a few months; not able to lay down and go to sleep easily; only if really tired; appetite goes up when anxious; weight has gone up a little We talked a little about counseling last time; she does not want to see a counselor  She has misplaced her gabapentin; not sure it was even helping  She has been bothered with sore throat and she saw the ENT specialist; he said it was okay, no cancer, postnasal drip and reflux; using Prilosec twice a day; no particular triggers  She also saw the podiatrist since our last visit, Sept 12th; she has arthritis in her feet, lots of places; he is going to make inserts for her shoes; he did not change her meds  She is getting a flu shot today  Reviewed labs from Sept 1st which showed no anemia, normal Cr, normal SGPT  Relevant past medical, surgical, family and social history reviewed and updated as indicated. Interim medical history since our last visit reviewed. Allergies and medications reviewed and updated. Quit smoking at age 66  Review of Systems  Per HPI unless specifically indicated above     Objective:    BP 146/84 mmHg  Pulse 75  Temp(Src) 97.8 F (36.6 C)  Wt 152 lb (68.947 kg)  SpO2 95%  Wt Readings from Last 3 Encounters:  05/21/15 152 lb (68.947 kg)  04/23/15 151 lb (68.493 kg)  12/29/14 149 lb (67.586 kg)    Physical Exam  Constitutional: She  appears well-developed and well-nourished.  Eyes: EOM are normal.  Cardiovascular: Normal rate and regular rhythm.   Pulmonary/Chest: Effort normal and breath sounds normal.  Lymphadenopathy:    She has no cervical adenopathy.  Neurological: She is alert.  Skin: Skin is warm.  Psychiatric: She has a normal mood and affect. Her behavior is normal. Judgment and thought content normal.   Results for orders placed or performed in visit on 04/23/15  Comprehensive metabolic panel  Result Value Ref Range   Glucose 102 (H) 65 - 99 mg/dL   BUN 16 8 - 27 mg/dL   Creatinine, Ser 0.78 0.57 - 1.00 mg/dL   GFR calc non Af Amer 76 >59 mL/min/1.73   GFR calc Af Amer 87 >59 mL/min/1.73   BUN/Creatinine Ratio 21 11 - 26   Sodium 140 134 - 144 mmol/L   Potassium 4.2 3.5 - 5.2 mmol/L   Chloride 100 97 - 108 mmol/L   CO2 28 18 - 29 mmol/L   Calcium 9.6 8.7 - 10.3 mg/dL   Total Protein 6.9 6.0 - 8.5 g/dL   Albumin 4.5 3.5 - 4.8 g/dL   Globulin, Total 2.4 1.5 - 4.5 g/dL   Albumin/Globulin Ratio 1.9 1.1 - 2.5   Bilirubin Total 0.3 0.0 - 1.2 mg/dL   Alkaline  Phosphatase 62 39 - 117 IU/L   AST 21 0 - 40 IU/L   ALT 28 0 - 32 IU/L  Lipid Panel w/o Chol/HDL Ratio  Result Value Ref Range   Cholesterol, Total 212 (H) 100 - 199 mg/dL   Triglycerides 170 (H) 0 - 149 mg/dL   HDL 69 >39 mg/dL   VLDL Cholesterol Cal 34 5 - 40 mg/dL   LDL Calculated 109 (H) 0 - 99 mg/dL  Microalbumin / creatinine urine ratio  Result Value Ref Range   Creatinine, Urine 90.0 Not Estab. mg/dL   Microalbum.,U,Random 123.4 Not Estab. ug/mL   MICROALB/CREAT RATIO 137.1 (H) 0.0 - 30.0 mg/g creat  CBC with Differential/Platelet  Result Value Ref Range   WBC 7.9 3.4 - 10.8 x10E3/uL   RBC 4.54 3.77 - 5.28 x10E6/uL   Hemoglobin 13.9 11.1 - 15.9 g/dL   Hematocrit 41.7 34.0 - 46.6 %   MCV 92 79 - 97 fL   MCH 30.6 26.6 - 33.0 pg   MCHC 33.3 31.5 - 35.7 g/dL   RDW 13.0 12.3 - 15.4 %   Platelets 216 150 - 379 x10E3/uL    Neutrophils 56 %   Lymphs 29 %   Monocytes 9 %   Eos 6 %   Basos 0 %   Neutrophils Absolute 4.3 1.4 - 7.0 x10E3/uL   Lymphocytes Absolute 2.3 0.7 - 3.1 x10E3/uL   Monocytes Absolute 0.7 0.1 - 0.9 x10E3/uL   EOS (ABSOLUTE) 0.5 (H) 0.0 - 0.4 x10E3/uL   Basophils Absolute 0.0 0.0 - 0.2 x10E3/uL   Immature Granulocytes 0 %   Immature Grans (Abs) 0.0 0.0 - 0.1 x10E3/uL      Assessment & Plan:   Problem List Items Addressed This Visit      Respiratory   Chronic pharyngitis    Evaluated by ENT (appreciate their contribution); 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; continue recs per ENT        Genitourinary   CKD (chronic kidney disease) stage 2, GFR 60-89 ml/min    GFR 76; avoid NSAIDs; continue ARB        Other   Anxiety - Primary    Patient has not actually started the buspirone; will have her start that and then return for f/u in 6 weeks; continue SSRI; I am not going to prescribe a benzo for this patient; counseling suggested, she is not interested      Relevant Medications   busPIRone (BUSPAR) 15 MG tablet   Microalbuminuria    Persistent on last urine check; continue ARB       Other Visit Diagnoses    Encounter for immunization        flu vaccine given today       Follow up plan: Return in about 6 weeks (around 07/02/2015) for follow-up if needed.  Medications Discontinued During This Encounter  Medication Reason  . magic mouthwash SOLN Completed Course  . gabapentin (NEURONTIN) 100 MG capsule Ineffective  . busPIRone (BUSPAR) 15 MG tablet Reorder   Meds ordered this encounter  Medications  . busPIRone (BUSPAR) 15 MG tablet    Sig: One-half of a pill by mouth twice a day for one week, then one whole pill twice a day; for anxiety    Dispense:  53 tablet    Refill:  0    Pharmacist -- increasing dose from 10 mg to 15 mg BID   An after-visit summary was printed and given to the patient at [place].  Please see the patient instructions which may  contain other information and recommendations beyond what is mentioned above in the assessment and plan.

## 2015-05-21 NOTE — Patient Instructions (Signed)
Try some meditation or calming behaviors for your stress Start back on the buspirone Continue the sertraline Return in 6 weeks if needed for medication adjutsment; if you are feeling great, then just cancel your appointment and let my staff know you are doing well  You received the flu shot today; it should protect you against the flu virus over the coming months; it will take about two weeks for antibodies to develop; do try to stay away from hospitals, nursing homes, and daycares during peak flu season; taking 1000 mg of vitamin C daily during flu season may help you avoid getting sick    Steps to Elicit the Relaxation Response The following is the technique reprinted with permission from Dr. Billie Ruddy book The Relaxation Response pages 162-163 1. Sit quietly in a comfortable position. 2. Close your eyes. 3. Deeply relax all your muscles,  beginning at your feet and progressing up to your face.  Keep them relaxed. 4. Breathe through your nose.  Become aware of your breathing.  As you breathe out, say the word, "one"*,  silently to yourself. For example,  breathe in ... out, "one",- in .. out, "one", etc.  Breathe easily and naturally. 5. Continue for 10 to 20 minutes.  You may open your eyes to check the time, but do not use an alarm.  When you finish, sit quietly for several minutes,  at first with your eyes closed and later with your eyes opened.  Do not stand up for a few minutes. 6. Do not worry about whether you are successful  in achieving a deep level of relaxation.  Maintain a passive attitude and permit relaxation to occur at its own pace.  When distracting thoughts occur,  try to ignore them by not dwelling upon them  and return to repeating "one."  With practice, the response should come with little effort.  Practice the technique once or twice daily,  but not within two hours after any meal,  since the digestive processes seem to interfere with  the  elicitation of the Relaxation Response. * It is better to use a soothing, mellifluous sound, preferably with no meaning. or association, to avoid stimulation of unnecessary thoughts - a mantra.

## 2015-05-25 NOTE — Assessment & Plan Note (Signed)
Patient has not actually started the buspirone; will have her start that and then return for f/u in 6 weeks; continue SSRI; I am not going to prescribe a benzo for this patient; counseling suggested, she is not interested

## 2015-05-25 NOTE — Assessment & Plan Note (Signed)
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

## 2015-05-25 NOTE — Assessment & Plan Note (Signed)
GFR 76; avoid NSAIDs; continue ARB

## 2015-05-25 NOTE — Assessment & Plan Note (Signed)
Persistent on last urine check; continue ARB

## 2015-06-17 ENCOUNTER — Other Ambulatory Visit: Payer: Self-pay | Admitting: Family Medicine

## 2015-06-17 NOTE — Telephone Encounter (Signed)
Routing to provider  

## 2015-06-30 ENCOUNTER — Other Ambulatory Visit: Payer: Self-pay | Admitting: Family Medicine

## 2015-06-30 DIAGNOSIS — L578 Other skin changes due to chronic exposure to nonionizing radiation: Secondary | ICD-10-CM | POA: Diagnosis not present

## 2015-06-30 DIAGNOSIS — L859 Epidermal thickening, unspecified: Secondary | ICD-10-CM | POA: Diagnosis not present

## 2015-06-30 DIAGNOSIS — C44319 Basal cell carcinoma of skin of other parts of face: Secondary | ICD-10-CM | POA: Diagnosis not present

## 2015-06-30 DIAGNOSIS — D485 Neoplasm of uncertain behavior of skin: Secondary | ICD-10-CM | POA: Diagnosis not present

## 2015-07-01 ENCOUNTER — Encounter: Payer: Self-pay | Admitting: Podiatry

## 2015-07-01 ENCOUNTER — Ambulatory Visit (INDEPENDENT_AMBULATORY_CARE_PROVIDER_SITE_OTHER): Payer: Medicare Other | Admitting: Podiatry

## 2015-07-01 DIAGNOSIS — M7752 Other enthesopathy of left foot: Secondary | ICD-10-CM | POA: Diagnosis not present

## 2015-07-01 DIAGNOSIS — M76829 Posterior tibial tendinitis, unspecified leg: Secondary | ICD-10-CM

## 2015-07-01 DIAGNOSIS — M6789 Other specified disorders of synovium and tendon, multiple sites: Secondary | ICD-10-CM

## 2015-07-01 DIAGNOSIS — M779 Enthesopathy, unspecified: Secondary | ICD-10-CM

## 2015-07-01 DIAGNOSIS — M778 Other enthesopathies, not elsewhere classified: Secondary | ICD-10-CM

## 2015-07-01 NOTE — Progress Notes (Signed)
She presents today to pick up her orthotics. She states that her foot is doing just fine at this point. Very little pain.  Objective: Vital signs are stable she is alert and oriented 3. Orthotics were dispensed and she was given both oral and home going instructions for the care and use of these.  Assessment: Capsulitis fasciitis.  Plan: Follow up with me in 6 weeks for evaluation of the orthotics.

## 2015-07-01 NOTE — Patient Instructions (Signed)

## 2015-07-07 ENCOUNTER — Telehealth: Payer: Self-pay | Admitting: Family Medicine

## 2015-07-07 ENCOUNTER — Ambulatory Visit (INDEPENDENT_AMBULATORY_CARE_PROVIDER_SITE_OTHER): Payer: Medicare Other | Admitting: Family Medicine

## 2015-07-07 ENCOUNTER — Encounter: Payer: Self-pay | Admitting: Family Medicine

## 2015-07-07 ENCOUNTER — Ambulatory Visit
Admission: RE | Admit: 2015-07-07 | Discharge: 2015-07-07 | Disposition: A | Discharge status: Home / Self Care | Payer: Medicare Other | Source: Ambulatory Visit | Attending: Family Medicine | Admitting: Family Medicine

## 2015-07-07 ENCOUNTER — Telehealth: Payer: Self-pay

## 2015-07-07 VITALS — BP 137/82 | HR 76 | Temp 97.2°F | Wt 150.0 lb

## 2015-07-07 DIAGNOSIS — M8580 Other specified disorders of bone density and structure, unspecified site: Secondary | ICD-10-CM | POA: Diagnosis not present

## 2015-07-07 DIAGNOSIS — M19011 Primary osteoarthritis, right shoulder: Secondary | ICD-10-CM | POA: Insufficient documentation

## 2015-07-07 DIAGNOSIS — M25511 Pain in right shoulder: Secondary | ICD-10-CM

## 2015-07-07 DIAGNOSIS — I10 Essential (primary) hypertension: Secondary | ICD-10-CM | POA: Diagnosis not present

## 2015-07-07 DIAGNOSIS — M1812 Unilateral primary osteoarthritis of first carpometacarpal joint, left hand: Secondary | ICD-10-CM | POA: Diagnosis not present

## 2015-07-07 DIAGNOSIS — M25532 Pain in left wrist: Secondary | ICD-10-CM | POA: Insufficient documentation

## 2015-07-07 DIAGNOSIS — Z23 Encounter for immunization: Secondary | ICD-10-CM

## 2015-07-07 DIAGNOSIS — Z791 Long term (current) use of non-steroidal anti-inflammatories (NSAID): Secondary | ICD-10-CM | POA: Diagnosis not present

## 2015-07-07 DIAGNOSIS — F419 Anxiety disorder, unspecified: Secondary | ICD-10-CM

## 2015-07-07 MED ORDER — MELOXICAM 15 MG PO TABS: 15.0000 mg | ORAL_TABLET | Freq: Every day | ORAL | Status: DC

## 2015-07-07 MED ORDER — BUSPIRONE HCL 15 MG PO TABS: 15.0000 mg | ORAL_TABLET | Freq: Two times a day (BID) | ORAL | Status: DC

## 2015-07-07 NOTE — Assessment & Plan Note (Signed)
Hard bony swelling; I don't think this is a ganglion cyst, at least not typical in my experience; will get xrays; I don't suspect RA; will start NSAID, use tylenol and turmeric and refer to ortho

## 2015-07-07 NOTE — Assessment & Plan Note (Signed)
Controlled today; she is not 100% sure that she is taking her ARB; I wrote down in the AVS for her to check at home; I'd like her on both meds, call pharmacist or here if any questions; she might have slight increase in BP with the addition of the RX NSAID, goal systolic less than [id number]

## 2015-07-07 NOTE — Assessment & Plan Note (Addendum)
Starting NSAID Rx for shoulder and wrist pain; explained cautions, may cause stomach upset, gastritis; make sure to take food; take an hour or more AFTER the aspirin (we want aspirin to hit the platelet first and knock out cyclo-oxygenase); continue to take PPI while on this medicine; warned of reasons to stop and seek medical attention, such as abdominal pain, dark stools, worsening heartburn or reflux; she agrees

## 2015-07-07 NOTE — Patient Instructions (Addendum)
You received the pneumonia vaccine called Prevnar (PCV-13) and you will not need another booster for the rest of your life per the current ACIP guidelines Please get xrays of the left wrist and the right shoulder at [facility] the new pain medicine, and take it one hour or more after your morning aspirin; take it with food Continue your medicine for stomach acid; if you develop abdominal pain or dark stools or worsening heartburn or acid reflux, stop it and get medical attention Do not take any OTHER non-steroidals like ibuprofen or motrin or Advil or Aleve Do please take tylenol (acetaminophen) in addition to the new prescription for extra pain relief Okay to continue any topicals for pain relief as well Try turmeric as a natural anti-inflammatory (for pain and arthritis). It comes in capsules where you buy aspirin and fish oil, but also as a spice where you buy pepper and garlic powder. We'll refer you to the bone and joint doctor You should be on amlodipine and losartan for blood pressure; if you get home and find out you are missing one or both bottles, call your pharmacist or call us Start taking your buspirone twice a day every day to help with your anxiety

## 2015-07-07 NOTE — Telephone Encounter (Signed)
Pt would like results of her xray asap.

## 2015-07-07 NOTE — Assessment & Plan Note (Signed)
She is not interested in working with a Social worker; I encouraged her to consider counseling, and will be glad to provide list of counselors if she changes her mind; will have her continue the SSRI; instructed her to start taking her buspirone twice a day every day

## 2015-07-07 NOTE — Telephone Encounter (Signed)
I spoke with [place] to see if they have a record of her colonoscopy. They had no record of it. Patient just thinks it was in 2007.

## 2015-07-07 NOTE — Progress Notes (Signed)
BP 137/82 mmHg  Pulse 76  Temp(Src) 97.2 F (36.2 C)  Wt 150 lb (68.04 kg)  SpO2 96%   Subjective:    Patient ID: Cynthia Vaughan, female    DOB: February 05, 1941, 74 y.o.   MRN: [id number]  HPI: Cynthia Vaughan is a 74 y.o. female  Chief Complaint  Patient presents with  . Arm Pain    right arm x for a month, but now it's every day. Wonders if it is arthritis or pulled something.    She has pain in her left wrist; noticed swelling there, getting progressively bigger over the last two months; never red or hot; hurts with movement, worse along radial aspect; hand does not tingle; hand does not turn cold or blue; no old injuries, but did cut little finger many years ago; she has tried a compression bandage and it does help; lots of arthritis in the family; wirst pain gets up to a 7 or 8 out of 10; worse at night  Her right shoulder hurts real bad; husband says it might her rotator cuff; pretty bad for the last month; no old or recent injuries; right-handed; no recent repetitive actions but has to reach overhead a lot with her height shrinking; if she pulls the right shoulder back, it pops a lot of turns; it just hurts; she uses the heating pad in the evening; she has been taking topical rub, just temporary relief; has tried NSAID which also gave just temporary relief; not stomach upset; shoulder pops when she pulls it back sometimes; pain goes up to a 7 o 8 out of 10 at its worst  Meds reviewed: Taking atorvastatin for cholesterol; taking amlodipine for BP; not sure if taking losartan or not, then says she does think she is taking it; uses inhaler when needed  She is on the anxiety medicine now which works okay; does get nervous quite often; not seeing counselor and not interested in seeing one; nervous about a lot of things that happen; taking sertraline every day and buspirone once or twice a day  Relevant past medical, surgical, family and social history reviewed and updated as indicated.  Interim medical history since our last visit reviewed. Allergies and medications reviewed and updated.  Review of Systems Per HPI unless specifically indicated above  Health maintenance reviewed: colonoscopy around 2007, thinks it was in [place]; last mammogram was 2013; she refuses mammogram; "if I get cancer, something's got to kill you and I'm just not going to worry about it"  She has not had the PCV-13 vaccine     Objective:    BP 137/82 mmHg  Pulse 76  Temp(Src) 97.2 F (36.2 C)  Wt 150 lb (68.04 kg)  SpO2 96%  Wt Readings from Last 3 Encounters:  07/07/15 150 lb (68.04 kg)  05/21/15 152 lb (68.947 kg)  04/23/15 151 lb (68.493 kg)    Physical Exam  Constitutional: She appears well-developed and well-nourished. No distress.  Eyes: EOM are normal.  Cardiovascular: Normal rate.   Pulses:      Radial pulses are 2+ on the right side, and 2+ on the left side.  Pulmonary/Chest: Effort normal.  Musculoskeletal:       Right shoulder: She exhibits decreased range of motion (some mild limitation with abduction 170 degrees), tenderness (acromioclavicular joint) and pain (anterior aspect, medial to humeral head). She exhibits no swelling, no effusion, normal pulse and normal strength.       Left wrist: She exhibits decreased range  of motion, tenderness, bony tenderness (hard knot radial aspect, less tender over ulnar aspect), swelling (visible hard knot over the radial aspect) and deformity. She exhibits no effusion and no crepitus.  Neurological:  Grip strength 5/5 B  Skin: No rash (specifically, no visible ras over the right shoulder) noted. No pallor.  Psychiatric: She has a normal mood and affect. Her speech is normal. Judgment and thought content normal.   Results for orders placed or performed in visit on 04/23/15  Comprehensive metabolic panel  Result Value Ref Range   Glucose 102 (H) 65 - 99 mg/dL   BUN 16 8 - 27 mg/dL   Creatinine, Ser 0.78 0.57 - 1.00 mg/dL   GFR calc  non Af Amer 76 >59 mL/min/1.73   GFR calc Af Amer 87 >59 mL/min/1.73   BUN/Creatinine Ratio 21 11 - 26   Sodium 140 134 - 144 mmol/L   Potassium 4.2 3.5 - 5.2 mmol/L   Chloride 100 97 - 108 mmol/L   CO2 28 18 - 29 mmol/L   Calcium 9.6 8.7 - 10.3 mg/dL   Total Protein 6.9 6.0 - 8.5 g/dL   Albumin 4.5 3.5 - 4.8 g/dL   Globulin, Total 2.4 1.5 - 4.5 g/dL   Albumin/Globulin Ratio 1.9 1.1 - 2.5   Bilirubin Total 0.3 0.0 - 1.2 mg/dL   Alkaline Phosphatase 62 39 - 117 IU/L   AST 21 0 - 40 IU/L   ALT 28 0 - 32 IU/L  Lipid Panel w/o Chol/HDL Ratio  Result Value Ref Range   Cholesterol, Total 212 (H) 100 - 199 mg/dL   Triglycerides 170 (H) 0 - 149 mg/dL   HDL 69 >39 mg/dL   VLDL Cholesterol Cal 34 5 - 40 mg/dL   LDL Calculated 109 (H) 0 - 99 mg/dL  Microalbumin / creatinine urine ratio  Result Value Ref Range   Creatinine, Urine 90.0 Not Estab. mg/dL   Microalbum.,U,Random 123.4 Not Estab. ug/mL   MICROALB/CREAT RATIO 137.1 (H) 0.0 - 30.0 mg/g creat  CBC with Differential/Platelet  Result Value Ref Range   WBC 7.9 3.4 - 10.8 x10E3/uL   RBC 4.54 3.77 - 5.28 x10E6/uL   Hemoglobin 13.9 11.1 - 15.9 g/dL   Hematocrit 41.7 34.0 - 46.6 %   MCV 92 79 - 97 fL   MCH 30.6 26.6 - 33.0 pg   MCHC 33.3 31.5 - 35.7 g/dL   RDW 13.0 12.3 - 15.4 %   Platelets 216 150 - 379 x10E3/uL   Neutrophils 56 %   Lymphs 29 %   Monocytes 9 %   Eos 6 %   Basos 0 %   Neutrophils Absolute 4.3 1.4 - 7.0 x10E3/uL   Lymphocytes Absolute 2.3 0.7 - 3.1 x10E3/uL   Monocytes Absolute 0.7 0.1 - 0.9 x10E3/uL   EOS (ABSOLUTE) 0.5 (H) 0.0 - 0.4 x10E3/uL   Basophils Absolute 0.0 0.0 - 0.2 x10E3/uL   Immature Granulocytes 0 %   Immature Grans (Abs) 0.0 0.0 - 0.1 x10E3/uL      Assessment & Plan:   Problem List Items Addressed This Visit      Cardiovascular and Mediastinum   Hypertension    Controlled today; she is not 100% sure that she is taking her ARB; I wrote down in the AVS for her to check at home; I'd like  her on both meds, call pharmacist or here if any questions; she might have slight increase in BP with the addition of the RX NSAID, goal systolic  less than 150        Musculoskeletal and Integument   Osteopenia    Last DEXA reviewed, Sept 2015; next due Sept 2017; I explained that her spine reading was actually normal range        Other   Anxiety    She is not interested in working with a Social worker; I encouraged her to consider counseling, and will be glad to provide list of counselors if she changes her mind; will have her continue the SSRI; instructed her to start taking her buspirone twice a day every day      Relevant Medications   busPIRone (BUSPAR) 15 MG tablet   Wrist pain, left - Primary    Hard bony swelling; I don't think this is a ganglion cyst, at least not typical in my experience; will get xrays; I don't suspect RA; will start NSAID, use tylenol and turmeric and refer to ortho      Relevant Orders   DG Wrist Complete Left   Ambulatory referral to Orthopedic Surgery   Shoulder pain, right    With report of popping in teh shoulder joint with some activities; will get plain xrays and refer to orthopaedist; she believes she has some muscle atrophy anteriorly, so there may be localized nerve impingement; distally, neurovascularly intact; will use Rx meloxicam; explained she should NOT use any OTC NSAIDs with this; I do want her to use tylenol per package directions; also, use topicals prn and turmeric; see AVS      Relevant Orders   DG Shoulder Right   Ambulatory referral to Orthopedic Surgery   NSAID long-term use    Starting NSAID Rx for shoulder and wrist pain; explained cautions, may cause stomach upset, gastritis; make sure to take food; take an hour or more AFTER the aspirin (we want aspirin to hit the platelet first and knock out cyclo-oxygenase); continue to take PPI while on this medicine; warned of reasons to stop and seek medical attention, such as abdominal pain,  dark stools, worsening heartburn or reflux; she agrees       Other Visit Diagnoses    Need for pneumococcal vaccination        PCV-13 given today; she will not need another booster of this per ACIP guidelines    Relevant Orders    Pneumococcal conjugate vaccine 13-valent IM (Completed)        Follow up plan: Return if symptoms worsen or fail to improve.  Meds ordered this encounter  Medications  . busPIRone (BUSPAR) 15 MG tablet    Sig: Take 1 tablet (15 mg total) by mouth 2 (two) times daily.    Dispense:  60 tablet    Refill:  5    Pharmacist -- increasing dose from 10 mg to 15 mg BID  . meloxicam (MOBIC) 15 MG tablet    Sig: Take 1 tablet (15 mg total) by mouth daily. Take with food; NO OTHER NSAID; take one hour AFTER aspirin    Dispense:  30 tablet    Refill:  0   An after-visit summary was printed and given to the patient at [place].  Please see the patient instructions which may contain other information and recommendations beyond what is mentioned above in the assessment and plan.  Orders Placed This Encounter  Procedures  . DG Wrist Complete Left  . DG Shoulder Right  . Pneumococcal conjugate vaccine 13-valent IM  . Ambulatory referral to Orthopedic Surgery   Face-to-face time with patient was more  than 25 minutes, >50% time spent counseling and coordination of care

## 2015-07-07 NOTE — Assessment & Plan Note (Addendum)
Last DEXA reviewed, Sept 2015; next due Sept 2017; I explained that her spine reading was actually normal range

## 2015-07-07 NOTE — Assessment & Plan Note (Signed)
With report of popping in teh shoulder joint with some activities; will get plain xrays and refer to orthopaedist; she believes she has some muscle atrophy anteriorly, so there may be localized nerve impingement; distally, neurovascularly intact; will use Rx meloxicam; explained she should NOT use any OTC NSAIDs with this; I do want her to use tylenol per package directions; also, use topicals prn and turmeric; see AVS

## 2015-07-08 NOTE — Telephone Encounter (Signed)
Routing to provider  

## 2015-07-08 NOTE — Telephone Encounter (Signed)
I sent them to [place] in results note; documented elsewhere

## 2015-07-14 ENCOUNTER — Other Ambulatory Visit: Payer: Self-pay | Admitting: Family Medicine

## 2015-07-14 NOTE — Telephone Encounter (Signed)
Your patient.  Thanks 

## 2015-07-21 DIAGNOSIS — M654 Radial styloid tenosynovitis [de Quervain]: Secondary | ICD-10-CM | POA: Insufficient documentation

## 2015-07-21 DIAGNOSIS — M7521 Bicipital tendinitis, right shoulder: Secondary | ICD-10-CM | POA: Diagnosis not present

## 2015-07-22 ENCOUNTER — Telehealth: Payer: Self-pay

## 2015-07-22 NOTE — Telephone Encounter (Signed)
She would like to speak with you. She thinks some of her meds are working against each other and she's having more hot flashes.

## 2015-07-24 NOTE — Telephone Encounter (Signed)
I returned her call She says she is fine; her body just needs to get used to the medicine; some hot flashes Call me back next week if needed

## 2015-08-02 ENCOUNTER — Other Ambulatory Visit: Payer: Self-pay | Admitting: Family Medicine

## 2015-08-02 NOTE — Telephone Encounter (Signed)
Dr. Sabra Heck is now taking care of the problem for which this Rx (meloxicam) was prescribed Please ask her to contact his office for refill or different therapy

## 2015-08-03 NOTE — Telephone Encounter (Signed)
Patient notified

## 2015-08-14 ENCOUNTER — Other Ambulatory Visit: Payer: Self-pay | Admitting: Family Medicine

## 2015-08-31 ENCOUNTER — Ambulatory Visit: Payer: Medicare Other | Admitting: Podiatry

## 2015-09-16 ENCOUNTER — Other Ambulatory Visit: Payer: Self-pay | Admitting: Family Medicine

## 2015-09-28 ENCOUNTER — Ambulatory Visit: Payer: Medicare Other | Admitting: Podiatry

## 2015-09-30 ENCOUNTER — Telehealth: Payer: Self-pay | Admitting: Family Medicine

## 2015-09-30 NOTE — Telephone Encounter (Signed)
Sept 1, 2016 labs reviewed; she'll be due for visit for high cholesterol and HTN, as well as fasting labs on or after October 21, 2015 please Rxs approved

## 2015-10-01 NOTE — Telephone Encounter (Signed)
Patient made appt for 10/21/15

## 2015-10-09 DIAGNOSIS — H547 Unspecified visual loss: Secondary | ICD-10-CM | POA: Diagnosis not present

## 2015-10-09 DIAGNOSIS — H2513 Age-related nuclear cataract, bilateral: Secondary | ICD-10-CM | POA: Diagnosis not present

## 2015-10-10 ENCOUNTER — Other Ambulatory Visit
Admission: RE | Admit: 2015-10-10 | Discharge: 2015-10-10 | Disposition: A | Discharge status: Home / Self Care | Payer: Medicare Other | Source: Ambulatory Visit | Attending: Ophthalmology | Admitting: Ophthalmology

## 2015-10-10 DIAGNOSIS — H2513 Age-related nuclear cataract, bilateral: Secondary | ICD-10-CM | POA: Diagnosis not present

## 2015-10-10 DIAGNOSIS — H547 Unspecified visual loss: Secondary | ICD-10-CM | POA: Diagnosis not present

## 2015-10-10 LAB — SEDIMENTATION RATE: Sed Rate: 10 mm/hr (ref 0–30)

## 2015-10-10 LAB — C-REACTIVE PROTEIN: CRP: <0.5 mg/dL (ref <1.0)

## 2015-10-14 ENCOUNTER — Other Ambulatory Visit: Payer: Self-pay | Admitting: Family Medicine

## 2015-10-14 MED ORDER — RANITIDINE HCL 300 MG PO CAPS: 300.0000 mg | ORAL_CAPSULE | Freq: Every evening | ORAL | Status: DC

## 2015-10-14 NOTE — Telephone Encounter (Signed)
I called pt, explained I'd like to decrease the PPI to just once a day and add H2 blocker; call with any problems

## 2015-10-16 ENCOUNTER — Ambulatory Visit: Payer: Medicare Other | Admitting: Family Medicine

## 2015-10-21 ENCOUNTER — Ambulatory Visit: Payer: Medicare Other | Admitting: Family Medicine

## 2015-10-21 ENCOUNTER — Encounter: Payer: Self-pay | Admitting: Family Medicine

## 2015-10-29 ENCOUNTER — Other Ambulatory Visit: Payer: Self-pay | Admitting: Family Medicine

## 2015-11-04 ENCOUNTER — Encounter: Payer: Self-pay | Admitting: Family Medicine

## 2015-11-04 ENCOUNTER — Ambulatory Visit (INDEPENDENT_AMBULATORY_CARE_PROVIDER_SITE_OTHER): Payer: Medicare Other | Admitting: Family Medicine

## 2015-11-04 VITALS — BP 128/84 | HR 75 | Temp 97.7°F | Wt 150.0 lb

## 2015-11-04 DIAGNOSIS — E785 Hyperlipidemia, unspecified: Secondary | ICD-10-CM | POA: Diagnosis not present

## 2015-11-04 DIAGNOSIS — Z791 Long term (current) use of non-steroidal anti-inflammatories (NSAID): Secondary | ICD-10-CM | POA: Diagnosis not present

## 2015-11-04 DIAGNOSIS — F329 Major depressive disorder, single episode, unspecified: Secondary | ICD-10-CM | POA: Diagnosis not present

## 2015-11-04 DIAGNOSIS — R300 Dysuria: Secondary | ICD-10-CM | POA: Insufficient documentation

## 2015-11-04 DIAGNOSIS — E559 Vitamin D deficiency, unspecified: Secondary | ICD-10-CM | POA: Diagnosis not present

## 2015-11-04 DIAGNOSIS — M150 Primary generalized (osteo)arthritis: Secondary | ICD-10-CM | POA: Diagnosis not present

## 2015-11-04 DIAGNOSIS — I10 Essential (primary) hypertension: Secondary | ICD-10-CM | POA: Diagnosis not present

## 2015-11-04 DIAGNOSIS — F32A Depression, unspecified: Secondary | ICD-10-CM

## 2015-11-04 DIAGNOSIS — M25511 Pain in right shoulder: Secondary | ICD-10-CM

## 2015-11-04 DIAGNOSIS — Z5181 Encounter for therapeutic drug level monitoring: Secondary | ICD-10-CM

## 2015-11-04 DIAGNOSIS — N182 Chronic kidney disease, stage 2 (mild): Secondary | ICD-10-CM | POA: Diagnosis not present

## 2015-11-04 DIAGNOSIS — R809 Proteinuria, unspecified: Secondary | ICD-10-CM | POA: Diagnosis not present

## 2015-11-04 DIAGNOSIS — M159 Polyosteoarthritis, unspecified: Secondary | ICD-10-CM

## 2015-11-04 LAB — UA/M W/RFLX CULTURE, ROUTINE
BILIRUBIN UA: NEGATIVE
Glucose, UA: NEGATIVE
Ketones, UA: NEGATIVE
Leukocytes, UA: NEGATIVE
Nitrite, UA: NEGATIVE
PH UA: 7 (ref 5.0–7.5)
Specific Gravity, UA: 1.015 (ref 1.005–1.030)
UUROB: 0.2 mg/dL (ref 0.2–1.0)

## 2015-11-04 LAB — MICROSCOPIC EXAMINATION

## 2015-11-04 MED ORDER — BUSPIRONE HCL 15 MG PO TABS: 15.0000 mg | ORAL_TABLET | Freq: Two times a day (BID) | ORAL | Status: DC

## 2015-11-04 MED ORDER — SERTRALINE HCL 100 MG PO TABS: 100.0000 mg | ORAL_TABLET | Freq: Every day | ORAL | Status: DC

## 2015-11-04 MED ORDER — TRAMADOL HCL 50 MG PO TABS: 50.0000 mg | ORAL_TABLET | Freq: Four times a day (QID) | ORAL | Status: DC | PRN

## 2015-11-04 NOTE — Progress Notes (Signed)
BP 128/84 mmHg  Pulse 75  Temp(Src) 97.7 F (36.5 C)  Wt 150 lb (68.04 kg)  SpO2 97%   Subjective:    Patient ID: Cynthia Vaughan, female    DOB: 03/14/1941, 75 y.o.   MRN: [id number]  HPI: Cynthia Vaughan is a 75 y.o. female  Chief Complaint  Patient presents with  . Hypertension    follow up and labs  . Hyperlipidemia    follow up and labs  . Medication Refill    she needs a refill on flonase  . Shoulder Pain    right shoulder and arm pain, hurts constantly   She has had right shoulder pain for months now; does not hurt to use the hand; feels like it has jerked; mostly hurts to pull shoulder / arm across her chest; hurts at night when laying down; does a lot during the day; heat gives some heat; tylenol helps a little bit; having numbness and weakness in the right arm; her pain is a 7 or 8 out of 10; tolerates tramadol  High cholesterol; not watching diet, "I eat what I want"; tolerating statin without muscle aches or abd pain  Allergic rhinitis; needs refill of flonase; works well; she is allergic to pollen  High blood pressure; controlled today; not checking much away from doctor; not much salt  GERD/heartburn; it's better she says; no real triggers, but tries to limit high acid foods like orange juice  Mood; doing pretty good on the medicines, sertraline and buspar; no thoughts of self-harm  Relevant past medical, surgical, family and social history reviewed and updated as indicated. Interim medical history since our last visit reviewed. Allergies and medications reviewed and updated.  Review of Systems Some bladder urgency Per HPI unless specifically indicated above     Objective:    BP 128/84 mmHg  Pulse 75  Temp(Src) 97.7 F (36.5 C)  Wt 150 lb (68.04 kg)  SpO2 97%  Wt Readings from Last 3 Encounters:  11/04/15 150 lb (68.04 kg)  07/07/15 150 lb (68.04 kg)  05/21/15 152 lb (68.947 kg)    Physical Exam  Constitutional: She appears well-developed  and well-nourished. No distress.  HENT:  Head: Atraumatic.  Eyes: EOM are normal. No scleral icterus.  Neck: No JVD present.  Cardiovascular: Normal rate and regular rhythm.   Pulses:      Posterior tibial pulses are 1+ on the left side.  Pulmonary/Chest: Effort normal and breath sounds normal.  Abdominal: Soft. Bowel sounds are normal. She exhibits no distension. There is no tenderness. There is no guarding.  Musculoskeletal: She exhibits no edema (no pitting edema; wearing compression stockings).       Right shoulder: She exhibits decreased range of motion, tenderness and pain.       Left foot: There is deformity (2nd and 3rd toes splayed like a peace sign; flat-footed).  Neurological: She is alert.  Grip 5/5; deltoids 5/5  Skin: Skin is warm. No pallor.  Psychiatric: She has a normal mood and affect.    Results for orders placed or performed during the hospital encounter of 10/10/15  Sedimentation rate  Result Value Ref Range   Sed Rate 10 0 - 30 mm/hr  C-reactive protein  Result Value Ref Range   CRP <0.5 <1.0 mg/dL      Assessment & Plan:   Problem List Items Addressed This Visit      Cardiovascular and Mediastinum   Hypertension    Controlled today; limit salt,  try DASH guidelines        Musculoskeletal and Integument   Osteoarthritis    Try turmeric and may continue meloxicam      Relevant Medications   traMADol (ULTRAM) 50 MG tablet     Genitourinary   CKD (chronic kidney disease) stage 2, GFR 60-89 ml/min    Check GFR today; may have to stop meloxicam, will see; if need to stop NSAIDs, then will address pain with tramadol and tylenol and turmeric        Other   Depression    Continue medicines and refills provided      Relevant Medications   busPIRone (BUSPAR) 15 MG tablet   sertraline (ZOLOFT) 100 MG tablet   Hyperlipidemia    Limit saturated fats; continue statin; check lipids today      Relevant Orders   Lipid Panel w/o Chol/HDL Ratio    Vitamin D deficiency disease    Check vit D today and supplement if needed      Relevant Orders   VITAMIN D 25 Hydroxy (Vit-D Deficiency, Fractures)   Microalbuminuria    Try to get more protein from plants; check level today      Relevant Orders   Microalbumin / creatinine urine ratio   Shoulder pain, right - Primary    Will have her see orthopaedist; tramadol PRN for pain      NSAID long-term use    Check GFR and stop meloxicam if declining      Relevant Orders   CBC with Differential/Platelet   Dysuria    Check urine today      Relevant Orders   UA/M w/rflx Culture, Routine   Medication monitoring encounter   Relevant Orders   Comprehensive metabolic panel      Follow up plan: Return in about 6 months (around 05/06/2016) for multiple issues.   Orders Placed This Encounter  Procedures  . Microalbumin / creatinine urine ratio  . UA/M w/rflx Culture, Routine  . CBC with Differential/Platelet  . Lipid Panel w/o Chol/HDL Ratio  . Comprehensive metabolic panel  . VITAMIN D 25 Hydroxy (Vit-D Deficiency, Fractures)   An after-visit summary was printed and given to the patient at [place].  Please see the patient instructions which may contain other information and recommendations beyond what is mentioned above in the assessment and plan.  ABN on file for vit D

## 2015-11-04 NOTE — Assessment & Plan Note (Signed)
Limit saturated fats; continue statin; check lipids today 

## 2015-11-04 NOTE — Patient Instructions (Addendum)
We'll have you see the orthopaedist about your shoulder We'll contact you about your lab results when they are resulted If you have not heard anything from my staff in a week about any orders/referrals/studies from today, please contact us here to follow-up [phone]  Consider trying cranberry pills or drinking cranberry juice every day Limit caffeinated coffee and try more decaf instead of regular to help decrease bladder irritation Try to limit saturated fats in your diet (bologna, hot dogs, barbeque, cheeseburgers, hamburgers, steak, bacon, sausage, cheese, etc.) and get more fresh fruits, vegetables, and whole grains Stay well-hydrated; try to drink 64 ounces of water a day Try turmeric as a natural anti-inflammatory (for pain and arthritis). It comes in capsules where you buy aspirin and fish oil, but also as a spice where you buy pepper and garlic powder.

## 2015-11-04 NOTE — Assessment & Plan Note (Signed)
Try turmeric and may continue meloxicam

## 2015-11-04 NOTE — Assessment & Plan Note (Signed)
Continue medicines and refills provided

## 2015-11-04 NOTE — Assessment & Plan Note (Signed)
Controlled today; limit salt, try DASH guidelines

## 2015-11-04 NOTE — Assessment & Plan Note (Signed)
Try to get more protein from plants; check level today

## 2015-11-04 NOTE — Assessment & Plan Note (Signed)
Check urine today 

## 2015-11-04 NOTE — Assessment & Plan Note (Signed)
Check vit D today and supplement if needed

## 2015-11-04 NOTE — Assessment & Plan Note (Signed)
Will have her see orthopaedist; tramadol PRN for pain

## 2015-11-04 NOTE — Assessment & Plan Note (Signed)
Check GFR and stop meloxicam if declining

## 2015-11-04 NOTE — Assessment & Plan Note (Signed)
Check GFR today; may have to stop meloxicam, will see; if need to stop NSAIDs, then will address pain with tramadol and tylenol and turmeric

## 2015-11-05 LAB — COMPREHENSIVE METABOLIC PANEL
ALBUMIN: 4.5 g/dL (ref 3.5–4.8)
ALK PHOS: 72 IU/L (ref 39–117)
ALT: 23 IU/L (ref 0–32)
AST: 20 IU/L (ref 0–40)
Albumin/Globulin Ratio: 1.9 (ref 1.2–2.2)
BILIRUBIN TOTAL: 0.3 mg/dL (ref 0.0–1.2)
BUN / CREAT RATIO: 18 (ref 11–26)
BUN: 16 mg/dL (ref 8–27)
CHLORIDE: 100 mmol/L (ref 96–106)
CO2: 23 mmol/L (ref 18–29)
Calcium: 9.5 mg/dL (ref 8.7–10.3)
Creatinine, Ser: 0.87 mg/dL (ref 0.57–1.00)
GFR calc Af Amer: 76 mL/min/{1.73_m2} (ref >59)
GFR calc non Af Amer: 66 mL/min/{1.73_m2} (ref >59)
GLUCOSE: 108 mg/dL — AB (ref 65–99)
Globulin, Total: 2.4 g/dL (ref 1.5–4.5)
Potassium: 4 mmol/L (ref 3.5–5.2)
SODIUM: 139 mmol/L (ref 134–144)
Total Protein: 6.9 g/dL (ref 6.0–8.5)

## 2015-11-05 LAB — CBC WITH DIFFERENTIAL/PLATELET
BASOS ABS: 0 10*3/uL (ref 0.0–0.2)
Basos: 0 %
EOS (ABSOLUTE): 0.4 10*3/uL (ref 0.0–0.4)
Eos: 4 %
Hematocrit: 41.9 % (ref 34.0–46.6)
Hemoglobin: 14.5 g/dL (ref 11.1–15.9)
Immature Grans (Abs): 0 10*3/uL (ref 0.0–0.1)
Immature Granulocytes: 0 %
LYMPHS ABS: 2.4 10*3/uL (ref 0.7–3.1)
Lymphs: 30 %
MCH: 32.6 pg (ref 26.6–33.0)
MCHC: 34.6 g/dL (ref 31.5–35.7)
MCV: 94 fL (ref 79–97)
MONOS ABS: 0.6 10*3/uL (ref 0.1–0.9)
Monocytes: 8 %
NEUTROS ABS: 4.7 10*3/uL (ref 1.4–7.0)
Neutrophils: 58 %
PLATELETS: 235 10*3/uL (ref 150–379)
RBC: 4.45 x10E6/uL (ref 3.77–5.28)
RDW: 12.9 % (ref 12.3–15.4)
WBC: 8.1 10*3/uL (ref 3.4–10.8)

## 2015-11-05 LAB — MICROALBUMIN / CREATININE URINE RATIO
Creatinine, Urine: 58.7 mg/dL
MICROALB/CREAT RATIO: 193.7 mg/g{creat} — AB (ref 0.0–30.0)
MICROALBUM., U, RANDOM: 113.7 ug/mL

## 2015-11-05 LAB — LIPID PANEL W/O CHOL/HDL RATIO
CHOLESTEROL TOTAL: 206 mg/dL — AB (ref 100–199)
HDL: 78 mg/dL (ref >39)
LDL CALC: 111 mg/dL — AB (ref 0–99)
Triglycerides: 83 mg/dL (ref 0–149)
VLDL Cholesterol Cal: 17 mg/dL (ref 5–40)

## 2015-11-05 LAB — VITAMIN D 25 HYDROXY (VIT D DEFICIENCY, FRACTURES): VIT D 25 HYDROXY: 23.6 ng/mL — AB (ref 30.0–100.0)

## 2015-11-06 ENCOUNTER — Telehealth: Payer: Self-pay | Admitting: Family Medicine

## 2015-11-06 MED ORDER — FLUTICASONE PROPIONATE 50 MCG/ACT NA SUSP: 2.0000 | Freq: Every day | NASAL | Status: DC

## 2015-11-06 NOTE — Telephone Encounter (Signed)
I talked with patient about labs She does take some vitamin D but not sure how much; she checked and the bottle says 250 (she verified it was D and not B12) 2000 vit D3 recommended daily for the next 2-3 months, then back to what she's taking Limit animal meat, stay hydrated; explained a little protein in kidneys; she allergic to ACE-I but is on ARB

## 2015-11-12 DIAGNOSIS — H2513 Age-related nuclear cataract, bilateral: Secondary | ICD-10-CM | POA: Diagnosis not present

## 2015-11-17 ENCOUNTER — Encounter: Payer: Self-pay | Admitting: *Deleted

## 2015-11-22 NOTE — H&P (Signed)
See scanned note.

## 2015-11-23 ENCOUNTER — Ambulatory Visit: Payer: Medicare Other | Admitting: Anesthesiology

## 2015-11-23 ENCOUNTER — Ambulatory Visit
Admission: RE | Admit: 2015-11-23 | Discharge: 2015-11-23 | Disposition: A | Discharge status: Home / Self Care | Payer: Medicare Other | Source: Ambulatory Visit | Attending: Ophthalmology | Admitting: Ophthalmology

## 2015-11-23 ENCOUNTER — Encounter: Payer: Self-pay | Admitting: *Deleted

## 2015-11-23 ENCOUNTER — Encounter
Admission: RE | Disposition: A | Discharge status: Home / Self Care | Payer: Self-pay | Source: Ambulatory Visit | Attending: Ophthalmology

## 2015-11-23 DIAGNOSIS — F329 Major depressive disorder, single episode, unspecified: Secondary | ICD-10-CM | POA: Diagnosis not present

## 2015-11-23 DIAGNOSIS — E7800 Pure hypercholesterolemia, unspecified: Secondary | ICD-10-CM | POA: Insufficient documentation

## 2015-11-23 DIAGNOSIS — R011 Cardiac murmur, unspecified: Secondary | ICD-10-CM | POA: Diagnosis not present

## 2015-11-23 DIAGNOSIS — J40 Bronchitis, not specified as acute or chronic: Secondary | ICD-10-CM | POA: Insufficient documentation

## 2015-11-23 DIAGNOSIS — R062 Wheezing: Secondary | ICD-10-CM | POA: Diagnosis not present

## 2015-11-23 DIAGNOSIS — M1990 Unspecified osteoarthritis, unspecified site: Secondary | ICD-10-CM | POA: Insufficient documentation

## 2015-11-23 DIAGNOSIS — Z90710 Acquired absence of both cervix and uterus: Secondary | ICD-10-CM | POA: Diagnosis not present

## 2015-11-23 DIAGNOSIS — H2511 Age-related nuclear cataract, right eye: Secondary | ICD-10-CM | POA: Insufficient documentation

## 2015-11-23 DIAGNOSIS — Z87891 Personal history of nicotine dependence: Secondary | ICD-10-CM | POA: Diagnosis not present

## 2015-11-23 DIAGNOSIS — H2513 Age-related nuclear cataract, bilateral: Secondary | ICD-10-CM | POA: Diagnosis not present

## 2015-11-23 DIAGNOSIS — K219 Gastro-esophageal reflux disease without esophagitis: Secondary | ICD-10-CM | POA: Diagnosis not present

## 2015-11-23 DIAGNOSIS — F419 Anxiety disorder, unspecified: Secondary | ICD-10-CM | POA: Insufficient documentation

## 2015-11-23 DIAGNOSIS — Z85828 Personal history of other malignant neoplasm of skin: Secondary | ICD-10-CM | POA: Insufficient documentation

## 2015-11-23 DIAGNOSIS — M7989 Other specified soft tissue disorders: Secondary | ICD-10-CM | POA: Insufficient documentation

## 2015-11-23 DIAGNOSIS — I10 Essential (primary) hypertension: Secondary | ICD-10-CM | POA: Diagnosis not present

## 2015-11-23 HISTORY — DX: Edema, unspecified: R60.9

## 2015-11-23 HISTORY — PX: CATARACT EXTRACTION W/PHACO: SHX586

## 2015-11-23 HISTORY — DX: Malignant (primary) neoplasm, unspecified: C80.1

## 2015-11-23 HISTORY — DX: Gastro-esophageal reflux disease without esophagitis: K21.9

## 2015-11-23 HISTORY — DX: Cardiac murmur, unspecified: R01.1

## 2015-11-23 HISTORY — DX: Wheezing: R06.2

## 2015-11-23 IMAGING — CT CT HEAD WITHOUT CONTRAST
1 series · 16 of 28 positions shown, 20 images · non-contrast
Comparison: None.

CLINICAL DATA: Headache head pain, last night, initial evaluation,
still complaining of left-sided headache and feeling of tightness

EXAM:
CT HEAD WITHOUT CONTRAST
TECHNIQUE: Contiguous axial images were obtained from the base of the skull
through the vertex without intravenous contrast.

[Series 2: soft tissue · axial · 0.38mm/px · z∈[-170,-44]mm · 16 of 28 slices shown, 20 images]
[im 2/28  brain]
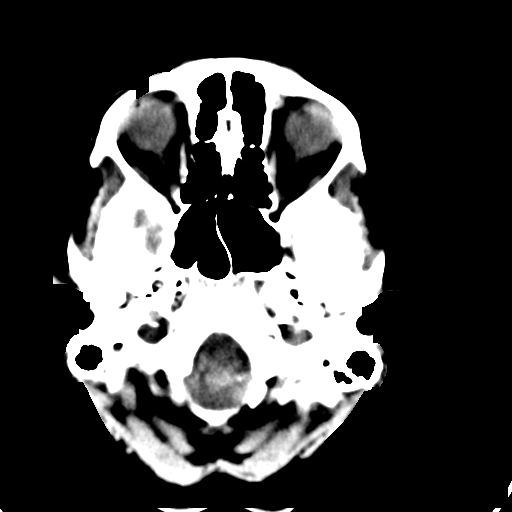
[im 2/28  bone]
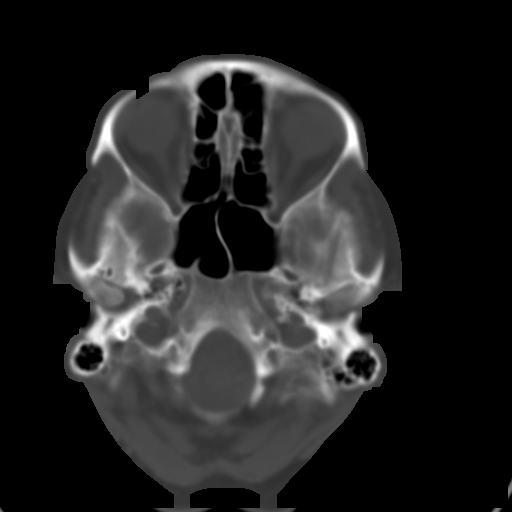
[im 4/28  brain]
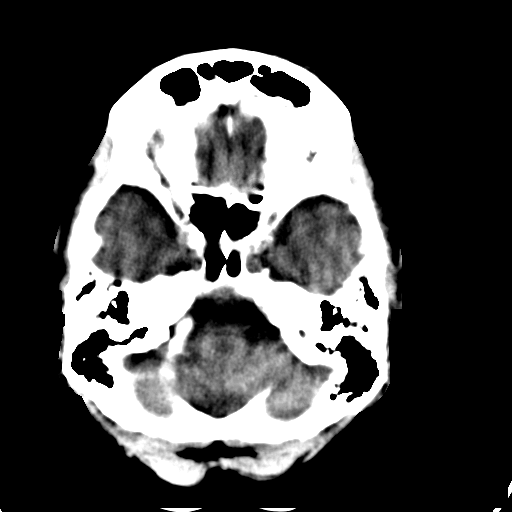
[im 6/28  brain]
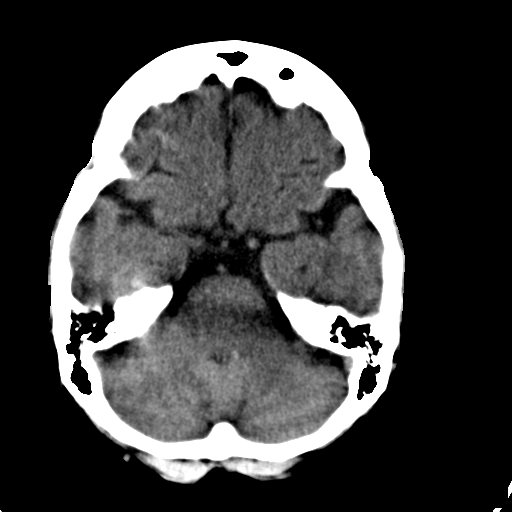
[im 7/28  brain]
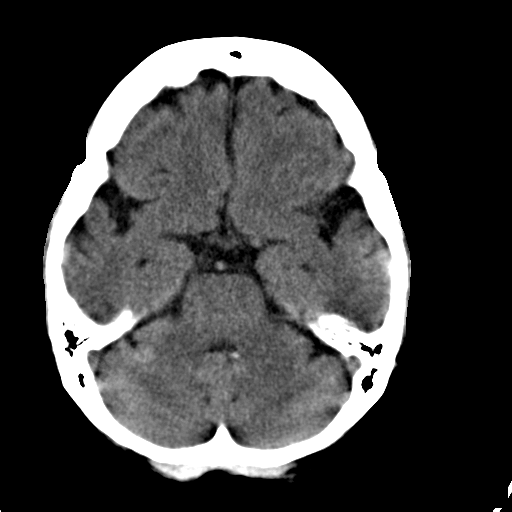
[im 9/28  brain]
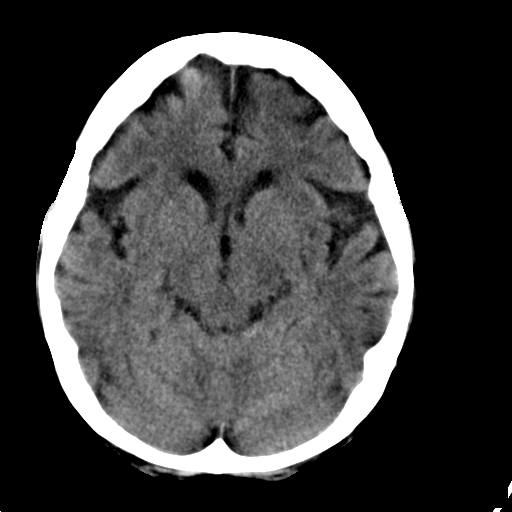
[im 9/28  bone]
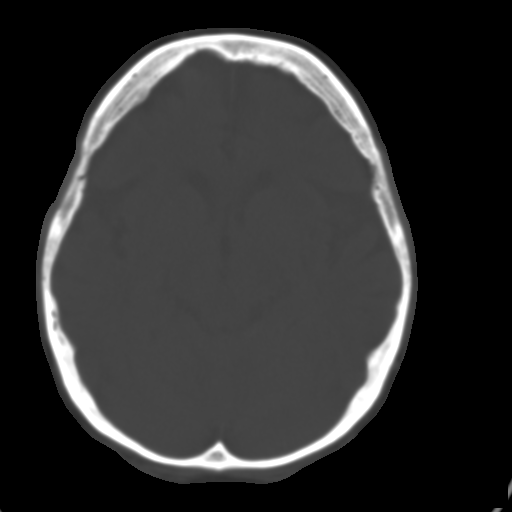
[im 10/28  brain]
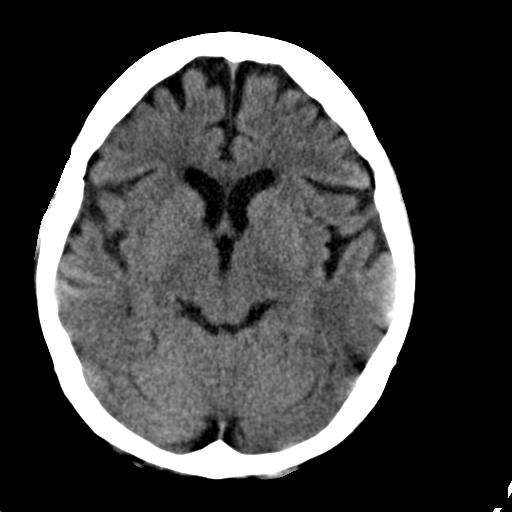
[im 12/28  brain]
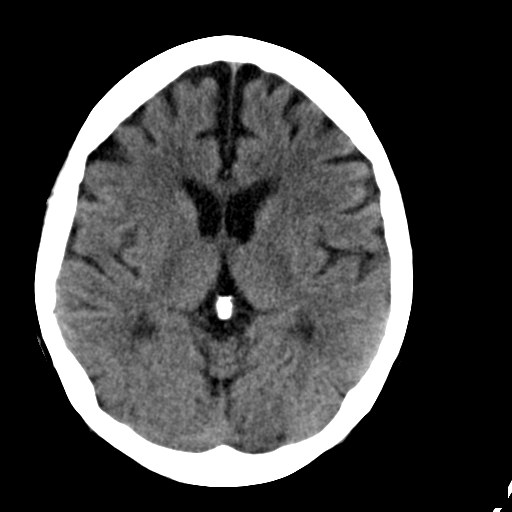
[im 14/28  brain]
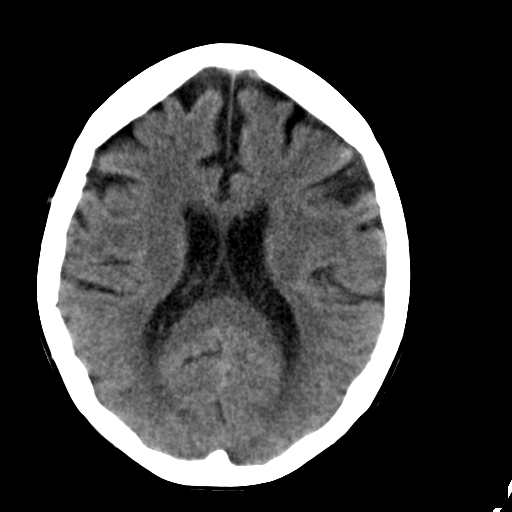
[im 15/28  brain]
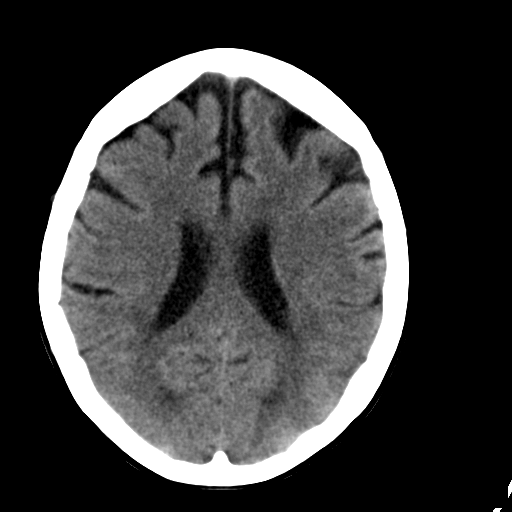
[im 15/28  bone]
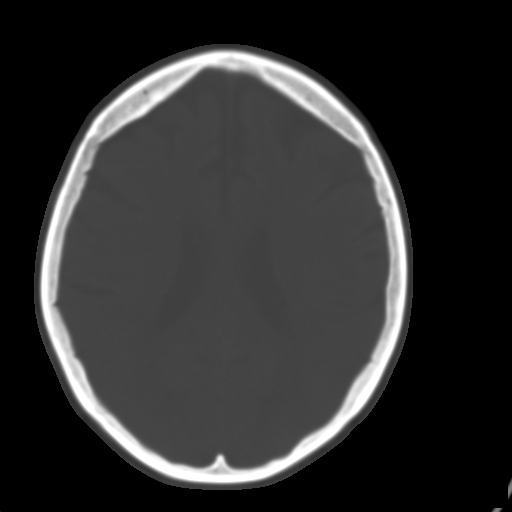
[im 17/28  brain]
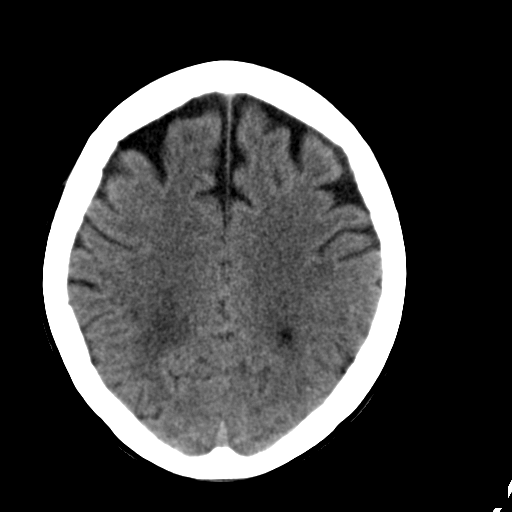
[im 19/28  brain]
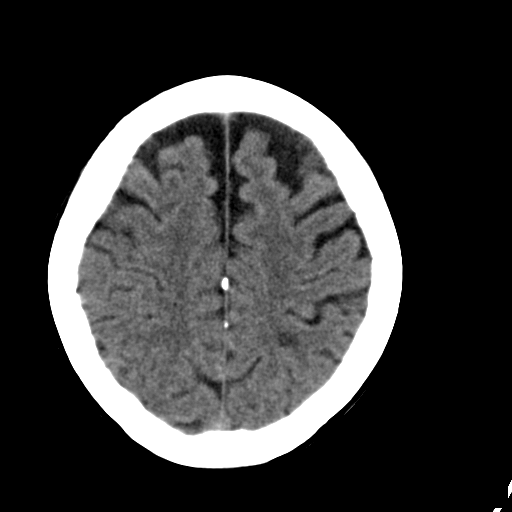
[im 20/28  brain]
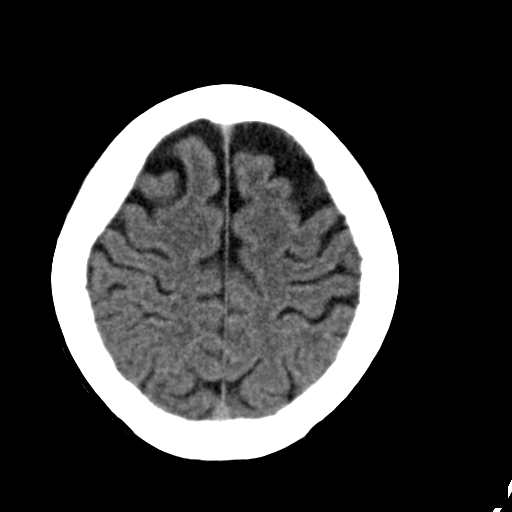
[im 22/28  brain]
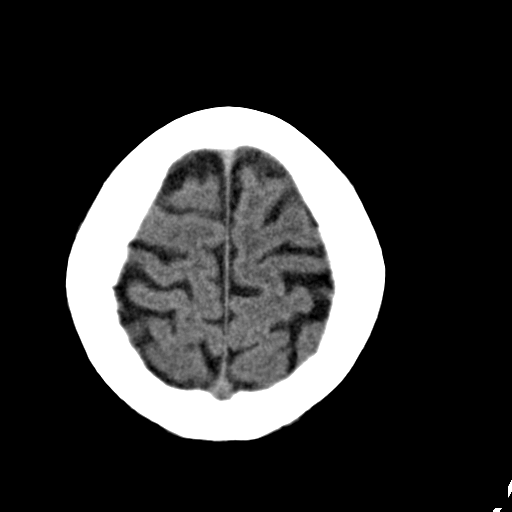
[im 22/28  bone]
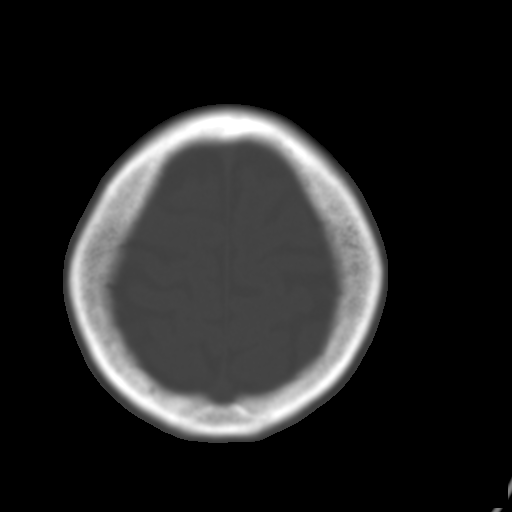
[im 23/28  brain]
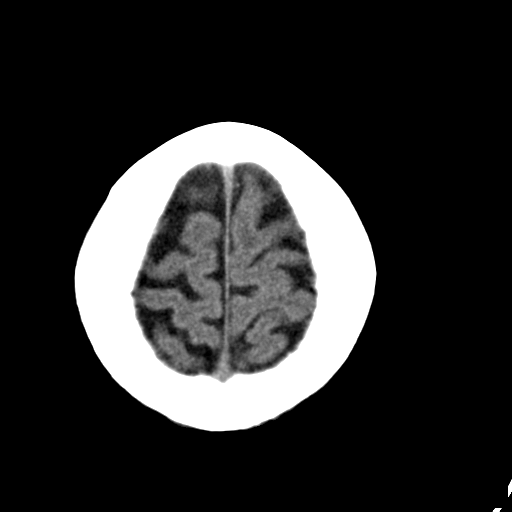
[im 25/28  brain]
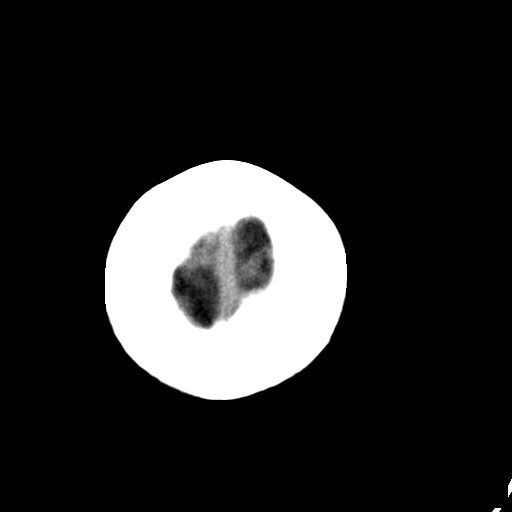
[im 27/28  brain]
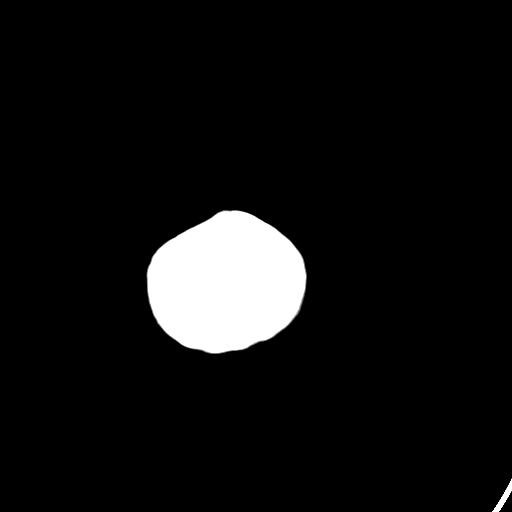

[16 of 28 positions shown; findings below may reference images not displayed]

FINDINGS: Moderate diffuse atrophy. Mild low attenuation in the deep white
matter. 1 cm oval focus of low attenuation equivalent to cerebral
spinal fluid density seen in the white matter of the posterior left
parietal lobe. No hemorrhage or extra-axial fluid. No hydrocephalus.
No vascular territory infarct. Calvarium is intact. No significant
inflammatory change in the visualized portions of the sinuses.
IMPRESSION: Age-related atrophy. 1 cm low-attenuation posterior parietal lobe on
the left possibly a chronic lacunar infarct. Based on attenuation in
well-defined appearance, it is likely not of acute significance.

## 2015-11-23 SURGERY — PHACOEMULSIFICATION, CATARACT, WITH IOL INSERTION
Anesthesia: Monitor Anesthesia Care | Laterality: Right

## 2015-11-23 MED ORDER — TETRACAINE HCL 0.5 % OP SOLN
OPHTHALMIC | Status: AC
  Filled 2015-11-23: qty 2

## 2015-11-23 MED ORDER — CEFUROXIME OPHTHALMIC INJECTION 1 MG/0.1 ML
INJECTION | OPHTHALMIC | Status: AC
  Filled 2015-11-23: qty 0.1

## 2015-11-23 MED ORDER — POVIDONE-IODINE 5 % OP SOLN
OPHTHALMIC | Status: AC
  Filled 2015-11-23: qty 30

## 2015-11-23 MED ORDER — MIDAZOLAM HCL 5 MG/5ML IJ SOLN
INTRAMUSCULAR | Status: DC | PRN
  Administered 2015-11-23: 1 mg via INTRAVENOUS

## 2015-11-23 MED ORDER — CARBACHOL 0.01 % IO SOLN
INTRAOCULAR | Status: DC | PRN
  Administered 2015-11-23: .5 mL via INTRAOCULAR

## 2015-11-23 MED ORDER — BUPIVACAINE HCL (PF) 0.75 % IJ SOLN
INTRAMUSCULAR | Status: AC
  Filled 2015-11-23: qty 10

## 2015-11-23 MED ORDER — LIDOCAINE HCL (PF) 4 % IJ SOLN
INTRAMUSCULAR | Status: AC
  Filled 2015-11-23: qty 5

## 2015-11-23 MED ORDER — TETRACAINE HCL 0.5 % OP SOLN
OPHTHALMIC | Status: DC | PRN
  Administered 2015-11-23: 2 [drp] via OPHTHALMIC

## 2015-11-23 MED ORDER — ALFENTANIL 500 MCG/ML IJ INJ
INJECTION | INTRAMUSCULAR | Status: DC | PRN
  Administered 2015-11-23: 500 ug via INTRAVENOUS

## 2015-11-23 MED ORDER — PHENYLEPHRINE HCL 10 % OP SOLN
1.0000 [drp] | OPHTHALMIC | Status: AC | PRN
  Administered 2015-11-23 (×4): 1 [drp] via OPHTHALMIC

## 2015-11-23 MED ORDER — NA CHONDROIT SULF-NA HYALURON 40-17 MG/ML IO SOLN
INTRAOCULAR | Status: DC | PRN
  Administered 2015-11-23: 1 mL via INTRAOCULAR

## 2015-11-23 MED ORDER — MOXIFLOXACIN HCL 0.5 % OP SOLN
OPHTHALMIC | Status: DC | PRN
  Administered 2015-11-23: 2 [drp] via OPHTHALMIC

## 2015-11-23 MED ORDER — HYALURONIDASE HUMAN 150 UNIT/ML IJ SOLN
INTRAMUSCULAR | Status: AC
  Filled 2015-11-23: qty 1

## 2015-11-23 MED ORDER — MOXIFLOXACIN HCL 0.5 % OP SOLN: 1.0000 [drp] | OPHTHALMIC | Status: AC | PRN

## 2015-11-23 MED ORDER — SODIUM CHLORIDE 0.9 % IV SOLN
INTRAVENOUS | Status: DC
Start: 2015-11-23 — End: 2015-11-23
  Administered 2015-11-23: 09:00:00 via INTRAVENOUS

## 2015-11-23 MED ORDER — EPINEPHRINE HCL 1 MG/ML IJ SOLN
INTRAMUSCULAR | Status: AC
  Filled 2015-11-23: qty 2

## 2015-11-23 MED ORDER — CYCLOPENTOLATE HCL 2 % OP SOLN
1.0000 [drp] | OPHTHALMIC | Status: AC | PRN
  Administered 2015-11-23 (×4): 1 [drp] via OPHTHALMIC

## 2015-11-23 MED ORDER — MOXIFLOXACIN HCL 0.5 % OP SOLN
1.0000 [drp] | OPHTHALMIC | Status: DC | PRN
  Administered 2015-11-23 (×3): 1 [drp] via OPHTHALMIC

## 2015-11-23 MED ORDER — LIDOCAINE HCL (PF) 4 % IJ SOLN
INTRAMUSCULAR | Status: DC | PRN
  Administered 2015-11-23: .5 mL via OPHTHALMIC

## 2015-11-23 MED ORDER — POVIDONE-IODINE 5 % OP SOLN
OPHTHALMIC | Status: DC | PRN
  Administered 2015-11-23: 1 via OPHTHALMIC

## 2015-11-23 MED ORDER — NA CHONDROIT SULF-NA HYALURON 40-17 MG/ML IO SOLN
INTRAOCULAR | Status: AC
  Filled 2015-11-23: qty 1

## 2015-11-23 MED ORDER — EPINEPHRINE HCL 1 MG/ML IJ SOLN
INTRAOCULAR | Status: DC | PRN
  Administered 2015-11-23: 250 mL via OPHTHALMIC

## 2015-11-23 MED ORDER — CEFUROXIME OPHTHALMIC INJECTION 1 MG/0.1 ML
INJECTION | OPHTHALMIC | Status: DC | PRN
Start: 2015-11-23 — End: 2015-11-23
  Administered 2015-11-23: .1 mL via INTRACAMERAL

## 2015-11-23 SURGICAL SUPPLY — 30 items
CANNULA ANT/CHMB 27GA (MISCELLANEOUS) ×3 IMPLANT
CORD BIP STRL DISP 12FT (MISCELLANEOUS) ×3 IMPLANT
CUP MEDICINE 2OZ PLAST GRAD ST (MISCELLANEOUS) ×3 IMPLANT
DRAPE XRAY CASSETTE 23X24 (DRAPES) ×3 IMPLANT
ERASER HMR WETFIELD 18G (MISCELLANEOUS) ×3 IMPLANT
GLOVE BIO SURGEON STRL SZ8 (GLOVE) ×3 IMPLANT
GLOVE SURG LX 6.5 MICRO (GLOVE) ×2
GLOVE SURG LX 8.0 MICRO (GLOVE) ×2
GLOVE SURG LX STRL 6.5 MICRO (GLOVE) ×1 IMPLANT
GLOVE SURG LX STRL 8.0 MICRO (GLOVE) ×1 IMPLANT
GOWN STRL REUS W/ TWL LRG LVL3 (GOWN DISPOSABLE) ×1 IMPLANT
GOWN STRL REUS W/ TWL XL LVL3 (GOWN DISPOSABLE) ×1 IMPLANT
GOWN STRL REUS W/TWL LRG LVL3 (GOWN DISPOSABLE) ×2
GOWN STRL REUS W/TWL XL LVL3 (GOWN DISPOSABLE) ×2
LENS IOL ACRSF IQ ULTRA 23.5 (Intraocular Lens) ×1 IMPLANT
LENS IOL ACRYSOF IQ 23.5 (Intraocular Lens) ×3 IMPLANT
PACK CATARACT (MISCELLANEOUS) ×3 IMPLANT
PACK CATARACT DINGLEDEIN LX (MISCELLANEOUS) ×3 IMPLANT
PACK EYE AFTER SURG (MISCELLANEOUS) ×3 IMPLANT
SHLD EYE VISITEC  UNIV (MISCELLANEOUS) ×3 IMPLANT
SOL BSS BAG (MISCELLANEOUS) ×3
SOL PREP PVP 2OZ (MISCELLANEOUS)
SOLUTION BSS BAG (MISCELLANEOUS) ×1 IMPLANT
SOLUTION PREP PVP 2OZ (MISCELLANEOUS) IMPLANT
SUT SILK 5-0 (SUTURE) ×3 IMPLANT
SYR 3ML LL SCALE MARK (SYRINGE) ×3 IMPLANT
SYR 5ML LL (SYRINGE) ×3 IMPLANT
SYR TB 1ML 27GX1/2 LL (SYRINGE) ×3 IMPLANT
WATER STERILE IRR 1000ML POUR (IV SOLUTION) ×3 IMPLANT
WIPE NON LINTING 3.25X3.25 (MISCELLANEOUS) ×3 IMPLANT

## 2015-11-23 NOTE — Discharge Instructions (Signed)
AMBULATORY SURGERY  DISCHARGE INSTRUCTIONS   1) The drugs that you were given will stay in your system until tomorrow so for the next 24 hours you should not:  A) Drive an automobile B) Make any legal decisions C) Drink any alcoholic beverage   2) You may resume regular meals tomorrow.  Today it is better to start with liquids and gradually work up to solid foods.  You may eat anything you prefer, but it is better to start with liquids, then soup and crackers, and gradually work up to solid foods.   3) Please notify your doctor immediately if you have any unusual bleeding, trouble breathing, redness and pain at the surgery site, drainage, fever, or pain not relieved by medication.    4) Additional Instructions:     Eye Surgery Discharge Instructions  Expect mild scratchy sensation or mild soreness. DO NOT RUB YOUR EYE!  The day of surgery:  Minimal physical activity, but bed rest is not required  No reading, computer work, or close hand work  No bending, lifting, or straining.  May watch TV  For 24 hours:  No driving, legal decisions, or alcoholic beverages  Safety precautions  Eat anything you prefer: It is better to start with liquids, then soup then solid foods.  _____ Eye patch should be worn until postoperative exam tomorrow.  ____ Solar shield eyeglasses should be worn for comfort in the sunlight/patch while sleeping  Resume all regular medications including aspirin or Coumadin if these were discontinued prior to surgery. You may shower, bathe, shave, or wash your hair. Tylenol may be taken for mild discomfort.  Call your doctor if you experience significant pain, nausea, or vomiting, fever > 101 or other signs of infection. [phone] or [phone] Specific instructions:  Follow-up Information    Follow up with DINGELDEIN,STEVEN, MD In 1 day.   Specialty:  Ophthalmology   Why:  April 4 at 11:00am   Contact information:   [address]   [place] [address] [phone]        Please contact your physician with any problems or Same Day Surgery at [phone], Monday through Friday 6 am to 4 pm, or  at [facility] number at [phone].

## 2015-11-23 NOTE — Interval H&P Note (Signed)
History and Physical Interval Note:  11/23/2015 10:26 AM  Cynthia Vaughan  has presented today for surgery, with the diagnosis of CATARACT  The various methods of treatment have been discussed with the patient and family. After consideration of risks, benefits and other options for treatment, the patient has consented to  Procedure(s): CATARACT EXTRACTION PHACO AND INTRAOCULAR LENS PLACEMENT ([place]) (Right) as a surgical intervention .  The patient's history has been reviewed, patient examined, no change in status, stable for surgery.  I have reviewed the patient's chart and labs.  Questions were answered to the patient's satisfaction.     [name]

## 2015-11-23 NOTE — Op Note (Signed)
Date of Surgery: 11/23/2015 Date of Dictation: 11/23/2015 11:08 AM Pre-operative Diagnosis:  Nuclear Sclerotic Cataract right Eye Post-operative Diagnosis: same Procedure performed: Extra-capsular Cataract Extraction (ECCE) with placement of a posterior chamber intraocular lens (IOL) right Eye IOL:  Implant Name Type Inv. Item Serial No. Manufacturer Lot No. LRB No. Used  LENS IOL ACRYSOF IQ 23.5 - [id number] Intraocular Lens LENS IOL ACRYSOF IQ 23.5 [id number] ALCON [id number] Right 1   Anesthesia: 2% Lidocaine and 4% Marcaine in a 50/50 mixture with 10 unites/ml of Hylenex given as a peribulbar Anesthesiologist: Anesthesiologist: Gunnar Bulla, MD CRNA: Silvana Newness, CRNA Complications: none Estimated Blood Loss: less than 1 ml  Description of procedure:  The patient was given anesthesia and sedation via intravenous access. The patient was then prepped and draped in the usual fashion. A 25-gauge needle was bent for initiating the capsulorhexis. A 5-0 silk suture was placed through the conjunctiva superior and inferiorly to serve as bridle sutures. Hemostasis was obtained at the superior limbus using an eraser cautery. A partial thickness groove was made at the anterior surgical limbus with a 64 Beaver blade and this was dissected anteriorly with an [employer]. The anterior chamber was entered at 10 o'clock with a 1.0 mm paracentesis knife and through the lamellar dissection with a 2.6 mm Alcon keratome. Epi-Shugarcaine 0.5 CC [9 cc BSS Plus (Alcon), 3 cc 4% preservative-free lidocaine (Hospira) and 4 cc 1:1000 preservative-free, bisulfite-free epinephrine] was injected into the anterior chamber via the paracentesis tract. Epi-Shugarcaine 0.5 CC [9 cc BSS Plus (Alcon), 3 cc 4% preservative-free lidocaine (Hospira) and 4 cc 1:1000 preservative-free, bisulfite-free epinephrine] was injected into the anterior chamber via the paracentesis tract. DiscoVisc was injected to replace the aqueous  and a continuous tear curvilinear capsulorhexis was performed using a bent 25-gauge needle.  Balance salt on a syringe was used to perform hydro-dissection and phacoemulsification was carried out using a divide and conquer technique. Procedure(s) with comments: CATARACT EXTRACTION PHACO AND INTRAOCULAR LENS PLACEMENT ([facility]) (Right) - [place]    1:30.9 AP%  26.9 CDE   42.29 flud casette lot # [id number]  exp05/31/2018. Irrigation/aspiration was used to remove the residual cortex and the capsular bag was inflated with DiscoVisc. The intraocular lens was inserted into the capsular bag using a pre-loaded UltraSert Delivery System. Irrigation/aspiration was used to remove the residual DiscoVisc. The wound was inflated with balanced salt and checked for leaks. None were found. Miostat was injected via the paracentesis track and 0.1 ml of cefuroxime containing 1 mg of drug  was injected via the paracentesis track. The wound was checked for leaks again and none were found.   The bridal sutures were removed and two drops of Vigamox were placed on the eye. An eye shield was placed to protect the eye and the patient was discharged to the recovery area in good condition.   Wilhelmine Krogstad MD

## 2015-11-23 NOTE — Anesthesia Postprocedure Evaluation (Signed)
Anesthesia Post Note  Patient: Cynthia Vaughan  Procedure(s) Performed: Procedure(s) (LRB): CATARACT EXTRACTION PHACO AND INTRAOCULAR LENS PLACEMENT ([facility]) (Right)  Patient location during evaluation: PACU Anesthesia Type: MAC Level of consciousness: awake and alert and oriented Pain management: satisfactory to patient Vital Signs Assessment: post-procedure vital signs reviewed and stable Respiratory status: spontaneous breathing and respiratory function stable Cardiovascular status: stable Anesthetic complications: no    Last Vitals:  Filed Vitals:   11/23/15 0906  BP: 144/89  Pulse: 65  Temp: 36.2 C    Last Pain:  Filed Vitals:   11/23/15 0907  PainSc: 4                  Tyshawna Alarid,  [place] [employer] [facility]

## 2015-11-23 NOTE — Transfer of Care (Signed)
Immediate Anesthesia Transfer of Care Note  Patient: Cynthia Vaughan  Procedure(s) Performed: Procedure(s) with comments: CATARACT EXTRACTION PHACO AND INTRAOCULAR LENS PLACEMENT ([facility]) (Right) - [place]    1:30.9 AP%  26.9 CDE   42.29 flud casette lot # [id number]  exp05/31/2018  Patient Location: PACU  Anesthesia Type:MAC  Level of Consciousness: awake, alert , oriented and patient cooperative  Airway & Oxygen Therapy: Patient Spontanous Breathing  Post-op Assessment: Report given to RN, Post -op Vital signs reviewed and stable and Patient moving all extremities X 4  Post vital signs: Reviewed and stable  Last Vitals:  Filed Vitals:   11/23/15 0906  BP: 144/89  Pulse: 65  Temp: 0000000 C    Complications: No apparent anesthesia complications

## 2015-11-23 NOTE — Anesthesia Preprocedure Evaluation (Signed)
Anesthesia Evaluation  Patient identified by MRN, date of birth, ID band Patient awake    Reviewed: Allergy & Precautions, NPO status , Patient's Chart, lab work & pertinent test results, reviewed documented beta blocker date and time   Airway Mallampati: II  TM Distance: >3 FB     Dental  (+) Chipped   Pulmonary former smoker,           Cardiovascular hypertension, Pt. on medications      Neuro/Psych PSYCHIATRIC DISORDERS Anxiety Depression    GI/Hepatic   Endo/Other    Renal/GU Renal InsufficiencyRenal disease     Musculoskeletal  (+) Arthritis ,   Abdominal   Peds  Hematology   Anesthesia Other Findings   Reproductive/Obstetrics                             Anesthesia Physical Anesthesia Plan  ASA: III  Anesthesia Plan: MAC   Post-op Pain Management:    Induction:   Airway Management Planned:   Additional Equipment:   Intra-op Plan:   Post-operative Plan:   Informed Consent: I have reviewed the patients History and Physical, chart, labs and discussed the procedure including the risks, benefits and alternatives for the proposed anesthesia with the patient or authorized representative who has indicated his/her understanding and acceptance.     Plan Discussed with: CRNA  Anesthesia Plan Comments:         Anesthesia Quick Evaluation

## 2015-11-28 ENCOUNTER — Other Ambulatory Visit: Payer: Self-pay | Admitting: Unknown Physician Specialty

## 2015-11-30 NOTE — Telephone Encounter (Signed)
Last Cr and K+ checked; Rx approved

## 2015-11-30 NOTE — Telephone Encounter (Signed)
Your patient.  Thanks 

## 2015-12-03 ENCOUNTER — Telehealth: Payer: Self-pay

## 2015-12-03 NOTE — Telephone Encounter (Signed)
Left message to call.

## 2015-12-03 NOTE — Telephone Encounter (Signed)
I see that Dr Sanda Klein referred her to Orthopedics?  I do need to see her.  Please let her know that for shoulders, x-rays rarely tell us any useful information.

## 2015-12-03 NOTE — Telephone Encounter (Signed)
She was seeing Dr. Sanda Klein for shoulder/arm pain. She is going to stay here and see you instead. She is still hurting a lot and  wants to know if you would be willing to order an xray or if you'd need to see her first.

## 2015-12-07 NOTE — Telephone Encounter (Signed)
Patient scheduled an appointment with Malachy Mood.

## 2015-12-08 ENCOUNTER — Ambulatory Visit (INDEPENDENT_AMBULATORY_CARE_PROVIDER_SITE_OTHER): Payer: Medicare Other | Admitting: Unknown Physician Specialty

## 2015-12-08 ENCOUNTER — Encounter: Payer: Self-pay | Admitting: Unknown Physician Specialty

## 2015-12-08 VITALS — BP 141/82 | HR 73 | Temp 97.9°F | Ht 59.0 in | Wt 149.4 lb

## 2015-12-08 DIAGNOSIS — M75101 Unspecified rotator cuff tear or rupture of right shoulder, not specified as traumatic: Secondary | ICD-10-CM | POA: Diagnosis not present

## 2015-12-08 DIAGNOSIS — M7541 Impingement syndrome of right shoulder: Secondary | ICD-10-CM

## 2015-12-08 NOTE — Progress Notes (Signed)
   BP 141/82 mmHg  Pulse 73  Temp(Src) 97.9 F (36.6 C)  Ht 4\' 11"  (1.499 m)  Wt 149 lb 6.4 oz (67.767 kg)  BMI 30.16 kg/m2  SpO2 97%  LMP  (LMP Unknown)   Subjective:    Patient ID: Cynthia Vaughan, female    DOB: [birth year], 75 y.o.   MRN: [id number]  HPI: Cynthia Vaughan is a 75 y.o. female  Chief Complaint  Patient presents with  . Arm Pain    pt states she has been having pain in her right arm for over a month now.   Right shoulder Right shoulder with increased pain noted for 6 months and worse in the last month.    Shoulder Pain  The pain is present in the right shoulder and right arm. There has been no history of extremity trauma. The problem occurs constantly. The problem has been gradually worsening. The quality of the pain is described as aching. The pain is severe. Associated symptoms include a limited range of motion. The symptoms are aggravated by activity and lying down. She has tried NSAIDS for the symptoms. The treatment provided no relief.    Relevant past medical, surgical, family and social history reviewed and updated as indicated. Interim medical history since our last visit reviewed. Allergies and medications reviewed and updated.  Review of Systems  Per HPI unless specifically indicated above     Objective:    BP 141/82 mmHg  Pulse 73  Temp(Src) 97.9 F (36.6 C)  Ht 4\' 11"  (1.499 m)  Wt 149 lb 6.4 oz (67.767 kg)  BMI 30.16 kg/m2  SpO2 97%  LMP  (LMP Unknown)  Wt Readings from Last 3 Encounters:  12/08/15 149 lb 6.4 oz (67.767 kg)  11/04/15 150 lb (68.04 kg)  07/07/15 150 lb (68.04 kg)    Physical Exam  Constitutional: She is oriented to person, place, and time. She appears well-developed and well-nourished. No distress.  HENT:  Head: Normocephalic and atraumatic.  Eyes: Conjunctivae and lids are normal. Right eye exhibits no discharge. Left eye exhibits no discharge. No scleral icterus.  Cardiovascular: Normal rate.   Pulmonary/Chest:  Effort normal.  Abdominal: Normal appearance. There is no splenomegaly or hepatomegaly.  Musculoskeletal:       Right shoulder: She exhibits decreased range of motion and tenderness. She exhibits no bony tenderness, no swelling, no effusion and no crepitus.  Positive impingment  Neurological: She is alert and oriented to person, place, and time.  Skin: Skin is intact. No rash noted. No pallor.  Psychiatric: She has a normal mood and affect. Her behavior is normal. Judgment and thought content normal.   STEROID INJECTION Procedure: Shoulder Intraarticular Steroid Injection Consent: DEL Risks, benefits, and alternative treatments discussed and all questions were answered.  Patient elected to proceed and verbal consent obtained.  Description: Area prepped and draped using   semi-sterile technique. Using a posterolateral approach a mixture of 4cc 0.5% marcaine & 1 cc Kenalog 40 injected in shoulder joint.  A bandage was then placed over the injection site. 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  Refer to PT.  Consider MRI          Follow up plan: Return for needs f/u for medications management.

## 2015-12-08 NOTE — Assessment & Plan Note (Signed)
Shoulder injection tolerated well.  Refer to PT.  Consider MRI

## 2015-12-21 ENCOUNTER — Encounter (INDEPENDENT_AMBULATORY_CARE_PROVIDER_SITE_OTHER): Payer: Medicare Other | Admitting: Podiatry

## 2015-12-21 NOTE — Progress Notes (Signed)
This encounter was created in error - please disregard.

## 2015-12-22 ENCOUNTER — Ambulatory Visit: Payer: Medicare Other | Admitting: Family Medicine

## 2015-12-29 ENCOUNTER — Ambulatory Visit: Payer: Medicare Other | Admitting: Family Medicine

## 2016-01-07 ENCOUNTER — Ambulatory Visit: Payer: Medicare Other | Admitting: Family Medicine

## 2016-01-07 DIAGNOSIS — M7550 Bursitis of unspecified shoulder: Secondary | ICD-10-CM | POA: Diagnosis not present

## 2016-01-15 ENCOUNTER — Ambulatory Visit (INDEPENDENT_AMBULATORY_CARE_PROVIDER_SITE_OTHER): Payer: Medicare Other | Admitting: Family Medicine

## 2016-01-15 ENCOUNTER — Encounter: Payer: Self-pay | Admitting: Family Medicine

## 2016-01-15 VITALS — BP 148/86 | HR 67 | Temp 98.5°F | Wt 146.0 lb

## 2016-01-15 DIAGNOSIS — M25511 Pain in right shoulder: Secondary | ICD-10-CM

## 2016-01-15 MED ORDER — NAPROXEN 500 MG PO TABS: 500.0000 mg | ORAL_TABLET | Freq: Two times a day (BID) | ORAL | Status: DC

## 2016-01-15 NOTE — Patient Instructions (Signed)
Try lidocaine 4% patches OTC Stop your ibuprofen and meloxicam and try the naproxen. You can take tylenol with it, but don't take any NSAIDs (advil, aleve, ibuprofen, motrin)  Impingement Syndrome, Rotator Cuff, Bursitis With Rehab Impingement syndrome is a condition that involves inflammation of the tendons of the rotator cuff and the subacromial bursa, that causes pain in the shoulder. The rotator cuff consists of four tendons and muscles that control much of the shoulder and upper arm function. The subacromial bursa is a fluid filled sac that helps reduce friction between the rotator cuff and one of the bones of the shoulder (acromion). Impingement syndrome is usually an overuse injury that causes swelling of the bursa (bursitis), swelling of the tendon (tendonitis), and/or a tear of the tendon (strain). Strains are classified into three categories. Grade 1 strains cause pain, but the tendon is not lengthened. Grade 2 strains include a lengthened ligament, due to the ligament being stretched or partially ruptured. With grade 2 strains there is still function, although the function may be decreased. Grade 3 strains include a complete tear of the tendon or muscle, and function is usually impaired. SYMPTOMS   Pain around the shoulder, often at the outer portion of the upper arm.  Pain that gets worse with shoulder function, especially when reaching overhead or lifting.  Sometimes, aching when not using the arm.  Pain that wakes you up at night.  Sometimes, tenderness, swelling, warmth, or redness over the affected area.  Loss of strength.  Limited motion of the shoulder, especially reaching behind the back (to the back pocket or to unhook bra) or across your body.  Crackling sound (crepitation) when moving the arm.  Biceps tendon pain and inflammation (in the front of the shoulder). Worse when bending the elbow or lifting. CAUSES  Impingement syndrome is often an overuse injury, in which  chronic (repetitive) motions cause the tendons or bursa to become inflamed. A strain occurs when a force is paced on the tendon or muscle that is greater than it can withstand. Common mechanisms of injury include: Stress from sudden increase in duration, frequency, or intensity of training.  Direct hit (trauma) to the shoulder.  Aging, erosion of the tendon with normal use.  Bony bump on shoulder (acromial spur). RISK INCREASES WITH:  Contact sports (football, wrestling, boxing).  Throwing sports (baseball, tennis, volleyball).  Weightlifting and bodybuilding.  Heavy labor.  Previous injury to the rotator cuff, including impingement.  Poor shoulder strength and flexibility.  Failure to warm up properly before activity.  Inadequate protective equipment.  Old age.  Bony bump on shoulder (acromial spur). PREVENTION   Warm up and stretch properly before activity.  Allow for adequate recovery between workouts.  Maintain physical fitness:  Strength, flexibility, and endurance.  Cardiovascular fitness.  Learn and use proper exercise technique. PROGNOSIS  If treated properly, impingement syndrome usually goes away within 6 weeks. Sometimes surgery is required.  RELATED COMPLICATIONS   Longer healing time if not properly treated, or if not given enough time to heal.  Recurring symptoms, that result in a chronic condition.  Shoulder stiffness, frozen shoulder, or loss of motion.  Rotator cuff tendon tear.  Recurring symptoms, especially if activity is resumed too soon, with overuse, with a direct blow, or when using poor technique. TREATMENT  Treatment first involves the use of ice and medicine, to reduce pain and inflammation. The use of strengthening and stretching exercises may help reduce pain with activity. These exercises may be performed at  home or with a therapist. If non-surgical treatment is unsuccessful after more than 6 months, surgery may be advised. After  surgery and rehabilitation, activity is usually possible in 3 months.  MEDICATION  If pain medicine is needed, nonsteroidal anti-inflammatory medicines (aspirin and ibuprofen), or other minor pain relievers (acetaminophen), are often advised.  Do not take pain medicine for 7 days before surgery.  Prescription pain relievers may be given, if your caregiver thinks they are needed. Use only as directed and only as much as you need.  Corticosteroid injections may be given by your caregiver. These injections should be reserved for the most serious cases, because they may only be given a certain number of times. HEAT AND COLD  Cold treatment (icing) should be applied for 10 to 15 minutes every 2 to 3 hours for inflammation and pain, and immediately after activity that aggravates your symptoms. Use ice packs or an ice massage.  Heat treatment may be used before performing stretching and strengthening activities prescribed by your caregiver, physical therapist, or athletic trainer. Use a heat pack or a warm water soak. SEEK MEDICAL CARE IF:   Symptoms get worse or do not improve in 4 to 6 weeks, despite treatment.  New, unexplained symptoms develop. (Drugs used in treatment may produce side effects.) EXERCISES  RANGE OF MOTION (ROM) AND STRETCHING EXERCISES - Impingement Syndrome (Rotator Cuff  Tendinitis, Bursitis) These exercises may help you when beginning to rehabilitate your injury. Your symptoms may go away with or without further involvement from your physician, physical therapist or athletic trainer. While completing these exercises, remember:   Restoring tissue flexibility helps normal motion to return to the joints. This allows healthier, less painful movement and activity.  An effective stretch should be held for at least 30 seconds.  A stretch should never be painful. You should only feel a gentle lengthening or release in the stretched tissue. STRETCH - Flexion, Standing  Stand  with good posture. With an underhand grip on your right / left hand, and an overhand grip on the opposite hand, grasp a broomstick or cane so that your hands are a little more than shoulder width apart.  Keeping your right / left elbow straight and shoulder muscles relaxed, push the stick with your opposite hand, to raise your right / left arm in front of your body and then overhead. Raise your arm until you feel a stretch in your right / left shoulder, but before you have increased shoulder pain.  Try to avoid shrugging your right / left shoulder as your arm rises, by keeping your shoulder blade tucked down and toward your mid-back spine. Hold for __________ seconds.  Slowly return to the starting position. Repeat __________ times. Complete this exercise __________ times per day. STRETCH - Abduction, Supine  Lie on your back. With an underhand grip on your right / left hand and an overhand grip on the opposite hand, grasp a broomstick or cane so that your hands are a little more than shoulder width apart.  Keeping your right / left elbow straight and your shoulder muscles relaxed, push the stick with your opposite hand, to raise your right / left arm out to the side of your body and then overhead. Raise your arm until you feel a stretch in your right / left shoulder, but before you have increased shoulder pain.  Try to avoid shrugging your right / left shoulder as your arm rises, by keeping your shoulder blade tucked down and toward your mid-back  spine. Hold for __________ seconds.  Slowly return to the starting position. Repeat __________ times. Complete this exercise __________ times per day. ROM - Flexion, Active-Assisted  Lie on your back. You may bend your knees for comfort.  Grasp a broomstick or cane so your hands are about shoulder width apart. Your right / left hand should grip the end of the stick, so that your hand is positioned "thumbs-up," as if you were about to shake  hands.  Using your healthy arm to lead, raise your right / left arm overhead, until you feel a gentle stretch in your shoulder. Hold for __________ seconds.  Use the stick to assist in returning your right / left arm to its starting position. Repeat __________ times. Complete this exercise __________ times per day.  ROM - Internal Rotation, Supine   Lie on your back on a firm surface. Place your right / left elbow about 60 degrees away from your side. Elevate your elbow with a folded towel, so that the elbow and shoulder are the same height.  Using a broomstick or cane and your strong arm, pull your right / left hand toward your body until you feel a gentle stretch, but no increase in your shoulder pain. Keep your shoulder and elbow in place throughout the exercise.  Hold for __________ seconds. Slowly return to the starting position. Repeat __________ times. Complete this exercise __________ times per day. STRETCH - Internal Rotation  Place your right / left hand behind your back, palm up.  Throw a towel or belt over your opposite shoulder. Grasp the towel with your right / left hand.  While keeping an upright posture, gently pull up on the towel, until you feel a stretch in the front of your right / left shoulder.  Avoid shrugging your right / left shoulder as your arm rises, by keeping your shoulder blade tucked down and toward your mid-back spine.  Hold for __________ seconds. Release the stretch, by lowering your healthy hand. Repeat __________ times. Complete this exercise __________ times per day. ROM - Internal Rotation   Using an underhand grip, grasp a stick behind your back with both hands.  While standing upright with good posture, slide the stick up your back until you feel a mild stretch in the front of your shoulder.  Hold for __________ seconds. Slowly return to your starting position. Repeat __________ times. Complete this exercise __________ times per day.  STRETCH  - Posterior Shoulder Capsule   Stand or sit with good posture. Grasp your right / left elbow and draw it across your chest, keeping it at the same height as your shoulder.  Pull your elbow, so your upper arm comes in closer to your chest. Pull until you feel a gentle stretch in the back of your shoulder.  Hold for __________ seconds. Repeat __________ times. Complete this exercise __________ times per day. STRENGTHENING EXERCISES - Impingement Syndrome (Rotator Cuff Tendinitis, Bursitis) These exercises may help you when beginning to rehabilitate your injury. They may resolve your symptoms with or without further involvement from your physician, physical therapist or athletic trainer. While completing these exercises, remember:  Muscles can gain both the endurance and the strength needed for everyday activities through controlled exercises.  Complete these exercises as instructed by your physician, physical therapist or athletic trainer. Increase the resistance and repetitions only as guided.  You may experience muscle soreness or fatigue, but the pain or discomfort you are trying to eliminate should never worsen during these exercises.  If this pain does get worse, stop and make sure you are following the directions exactly. If the pain is still present after adjustments, discontinue the exercise until you can discuss the trouble with your clinician.  During your recovery, avoid activity or exercises which involve actions that place your injured hand or elbow above your head or behind your back or head. These positions stress the tissues which you are trying to heal. STRENGTH - Scapular Depression and Adduction   With good posture, sit on a firm chair. Support your arms in front of you, with pillows, arm rests, or on a table top. Have your elbows in line with the sides of your body.  Gently draw your shoulder blades down and toward your mid-back spine. Gradually increase the tension, without  tensing the muscles along the top of your shoulders and the back of your neck.  Hold for __________ seconds. Slowly release the tension and relax your muscles completely before starting the next repetition.  After you have practiced this exercise, remove the arm support and complete the exercise in standing as well as sitting position. Repeat __________ times. Complete this exercise __________ times per day.  STRENGTH - Shoulder Abductors, Isometric  With good posture, stand or sit about 4-6 inches from a wall, with your right / left side facing the wall.  Bend your right / left elbow. Gently press your right / left elbow into the wall. Increase the pressure gradually, until you are pressing as hard as you can, without shrugging your shoulder or increasing any shoulder discomfort.  Hold for __________ seconds.  Release the tension slowly. Relax your shoulder muscles completely before you begin the next repetition. Repeat __________ times. Complete this exercise __________ times per day.  STRENGTH - External Rotators, Isometric  Keep your right / left elbow at your side and bend it 90 degrees.  Step into a door frame so that the outside of your right / left wrist can press against the door frame without your upper arm leaving your side.  Gently press your right / left wrist into the door frame, as if you were trying to swing the back of your hand away from your stomach. Gradually increase the tension, until you are pressing as hard as you can, without shrugging your shoulder or increasing any shoulder discomfort.  Hold for __________ seconds.  Release the tension slowly. Relax your shoulder muscles completely before you begin the next repetition. Repeat __________ times. Complete this exercise __________ times per day.  STRENGTH - Supraspinatus   Stand or sit with good posture. Grasp a __________ weight, or an exercise band or tubing, so that your hand is "thumbs-up," like you are  shaking hands.  Slowly lift your right / left arm in a "V" away from your thigh, diagonally into the space between your side and straight ahead. Lift your hand to shoulder height or as far as you can, without increasing any shoulder pain. At first, many people do not lift their hands above shoulder height.  Avoid shrugging your right / left shoulder as your arm rises, by keeping your shoulder blade tucked down and toward your mid-back spine.  Hold for __________ seconds. Control the descent of your hand, as you slowly return to your starting position. Repeat __________ times. Complete this exercise __________ times per day.  STRENGTH - External Rotators  Secure a rubber exercise band or tubing to a fixed object (table, pole) so that it is at the same height as your  right / left elbow when you are standing or sitting on a firm surface.  Stand or sit so that the secured exercise band is at your uninjured side.  Bend your right / left elbow 90 degrees. Place a folded towel or small pillow under your right / left arm, so that your elbow is a few inches away from your side.  Keeping the tension on the exercise band, pull it away from your body, as if pivoting on your elbow. Be sure to keep your body steady, so that the movement is coming only from your rotating shoulder.  Hold for __________ seconds. Release the tension in a controlled manner, as you return to the starting position. Repeat __________ times. Complete this exercise __________ times per day.  STRENGTH - Internal Rotators   Secure a rubber exercise band or tubing to a fixed object (table, pole) so that it is at the same height as your right / left elbow when you are standing or sitting on a firm surface.  Stand or sit so that the secured exercise band is at your right / left side.  Bend your elbow 90 degrees. Place a folded towel or small pillow under your right / left arm so that your elbow is a few inches away from your  side.  Keeping the tension on the exercise band, pull it across your body, toward your stomach. Be sure to keep your body steady, so that the movement is coming only from your rotating shoulder.  Hold for __________ seconds. Release the tension in a controlled manner, as you return to the starting position. Repeat __________ times. Complete this exercise __________ times per day.  STRENGTH - Scapular Protractors, Standing   Stand arms length away from a wall. Place your hands on the wall, keeping your elbows straight.  Begin by dropping your shoulder blades down and toward your mid-back spine.  To strengthen your protractors, keep your shoulder blades down, but slide them forward on your rib cage. It will feel as if you are lifting the back of your rib cage away from the wall. This is a subtle motion and can be challenging to complete. Ask your caregiver for further instruction, if you are not sure you are doing the exercise correctly.  Hold for __________ seconds. Slowly return to the starting position, resting the muscles completely before starting the next repetition. Repeat __________ times. Complete this exercise __________ times per day. STRENGTH - Scapular Protractors, Supine  Lie on your back on a firm surface. Extend your right / left arm straight into the air while holding a __________ weight in your hand.  Keeping your head and back in place, lift your shoulder off the floor.  Hold for __________ seconds. Slowly return to the starting position, and allow your muscles to relax completely before starting the next repetition. Repeat __________ times. Complete this exercise __________ times per day. STRENGTH - Scapular Protractors, Quadruped  Get onto your hands and knees, with your shoulders directly over your hands (or as close as you can be, comfortably).  Keeping your elbows locked, lift the back of your rib cage up into your shoulder blades, so your mid-back rounds out. Keep  your neck muscles relaxed.  Hold this position for __________ seconds. Slowly return to the starting position and allow your muscles to relax completely before starting the next repetition. Repeat __________ times. Complete this exercise __________ times per day.  STRENGTH - Scapular Retractors  Secure a rubber exercise band or tubing  to a fixed object (table, pole), so that it is at the height of your shoulders when you are either standing, or sitting on a firm armless chair.  With a palm down grip, grasp an end of the band in each hand. Straighten your elbows and lift your hands straight in front of you, at shoulder height. Step back, away from the secured end of the band, until it becomes tense.  Squeezing your shoulder blades together, draw your elbows back toward your sides, as you bend them. Keep your upper arms lifted away from your body throughout the exercise.  Hold for __________ seconds. Slowly ease the tension on the band, as you reverse the directions and return to the starting position. Repeat __________ times. Complete this exercise __________ times per day. STRENGTH - Shoulder Extensors   Secure a rubber exercise band or tubing to a fixed object (table, pole) so that it is at the height of your shoulders when you are either standing, or sitting on a firm armless chair.  With a thumbs-up grip, grasp an end of the band in each hand. Straighten your elbows and lift your hands straight in front of you, at shoulder height. Step back, away from the secured end of the band, until it becomes tense.  Squeezing your shoulder blades together, pull your hands down to the sides of your thighs. Do not allow your hands to go behind you.  Hold for __________ seconds. Slowly ease the tension on the band, as you reverse the directions and return to the starting position. Repeat __________ times. Complete this exercise __________ times per day.  STRENGTH - Scapular Retractors and External  Rotators   Secure a rubber exercise band or tubing to a fixed object (table, pole) so that it is at the height as your shoulders, when you are either standing, or sitting on a firm armless chair.  With a palm down grip, grasp an end of the band in each hand. Bend your elbows 90 degrees and lift your elbows to shoulder height, at your sides. Step back, away from the secured end of the band, until it becomes tense.  Squeezing your shoulder blades together, rotate your shoulders so that your upper arms and elbows remain stationary, but your fists travel upward to head height.  Hold for _____30_____ seconds. Slowly ease the tension on the band, as you reverse the directions and return to the starting position. Repeat _____1-2_____ times. Complete this exercise ______2____ times per day.  STRENGTH - Scapular Retractors and External Rotators, Rowing   Secure a rubber exercise band or tubing to a fixed object (table, pole) so that it is at the height of your shoulders, when you are either standing, or sitting on a firm armless chair.  With a palm down grip, grasp an end of the band in each hand. Straighten your elbows and lift your hands straight in front of you, at shoulder height. Step back, away from the secured end of the band, until it becomes tense.  Step 1: Squeeze your shoulder blades together. Bending your elbows, draw your hands to your chest, as if you are rowing a boat. At the end of this motion, your hands and elbow should be at shoulder height and your elbows should be out to your sides.  Step 2: Rotate your shoulders, to raise your hands above your head. Your forearms should be vertical and your upper arms should be horizontal.  Hold for __________ seconds. Slowly ease the tension on the band,  as you reverse the directions and return to the starting position. Repeat __________ times. Complete this exercise __________ times per day.  STRENGTH - Scapular Depressors  Find a sturdy chair  without wheels, such as a dining room chair.  Keeping your feet on the floor, and your hands on the chair arms, lift your bottom up from the seat, and lock your elbows.  Keeping your elbows straight, allow gravity to pull your body weight down. Your shoulders will rise toward your ears.  Raise your body against gravity by drawing your shoulder blades down your back, shortening the distance between your shoulders and ears. Although your feet should always maintain contact with the floor, your feet should progressively support less body weight, as you get stronger.  Hold for __________ seconds. In a controlled and slow manner, lower your body weight to begin the next repetition. Repeat __________ times. Complete this exercise __________ times per day.    This information is not intended to replace advice given to you by your health care provider. Make sure you discuss any questions you have with your health care provider.   Document Released: 08/08/2005 Document Revised: 08/29/2014 Document Reviewed: 11/20/2008 Elsevier Interactive Patient Education [employer].

## 2016-01-15 NOTE — Assessment & Plan Note (Signed)
Known arthritis. It looks like she has some impingement going on as well. Does not want to see surgery. Has been having steroid injections too often. Had been taking ibuprofen on top of meloxicam. Stop meloxicam. Stop ibuprofen. Start naproxen. Exercises for her shoulder given today. Never to the point of pain, only to the point of stretch. If not significantly better or worse when she gets back from her trip we will get her into PT and consider MRI based on her symptoms. Consider lidocaine patches. Continue salanpas patches. Call with any concerns.

## 2016-01-15 NOTE — Progress Notes (Signed)
BP 148/86 mmHg  Pulse 67  Temp(Src) 98.5 F (36.9 C)  Wt 146 lb (66.225 kg)  SpO2 67%  LMP  (LMP Unknown)   Subjective:    Patient ID: Cynthia Vaughan, female    DOB: 1940/12/02, 75 y.o.   MRN: [id number]  HPI: Cynthia Vaughan is a 75 y.o. female  Chief Complaint  Patient presents with  . Arm Pain    right arm pain, patient went to urgent last week and was given an injection and a x-ray. The injections help for a while, then it stops.    SHOULDER PAIN Duration: 6+ months. Saw other provider about a month ago who injected her shoulder. Had another shot last week at an urgent care. Unclear what type of shot she had last week. Does not want to have any surgeries.  Involved shoulder: right Mechanism of injury: unknown Location: posterior Onset:gradual Severity: severe  Quality:  "pain" Frequency: constant Radiation: yes into her neck and down into her hand Aggravating factors: lifting groceries   Alleviating factors: ibuprofen helps, shots for small amount of time, salanpas patches   Status: stable Treatments attempted: meloxicam, rest, ice, heat, APAP, ibuprofen and aleve, exercises- not doing them now done them years ago  Relief with NSAIDs?:  mild Weakness: yes Numbness: yes Decreased grip strength: yes Redness: no Swelling: no Bruising: no Fevers: no  Relevant past medical, surgical, family and social history reviewed and updated as indicated. Interim medical history since our last visit reviewed. Allergies and medications reviewed and updated.  Review of Systems  Constitutional: Negative.   Respiratory: Negative.   Cardiovascular: Negative.   Musculoskeletal: Positive for arthralgias. Negative for myalgias, back pain, joint swelling, gait problem, neck pain and neck stiffness.  Psychiatric/Behavioral: Negative.     Per HPI unless specifically indicated above     Objective:    BP 148/86 mmHg  Pulse 67  Temp(Src) 98.5 F (36.9 C)  Wt 146 lb (66.225 kg)   SpO2 67%  LMP  (LMP Unknown)  Wt Readings from Last 3 Encounters:  01/15/16 146 lb (66.225 kg)  12/08/15 149 lb 6.4 oz (67.767 kg)  11/04/15 150 lb (68.04 kg)    Physical Exam  Constitutional: She is oriented to person, place, and time. She appears well-developed and well-nourished. No distress.  HENT:  Head: Normocephalic and atraumatic.  Right Ear: Hearing normal.  Left Ear: Hearing normal.  Nose: Nose normal.  Eyes: Conjunctivae and lids are normal. Right eye exhibits no discharge. Left eye exhibits no discharge. No scleral icterus.  Cardiovascular: Normal rate, regular rhythm, normal heart sounds and intact distal pulses.  Exam reveals no gallop and no friction rub.   No murmur heard. Pulmonary/Chest: Effort normal and breath sounds normal. No respiratory distress. She has no wheezes. She has no rales. She exhibits no tenderness.  Neurological: She is alert and oriented to person, place, and time.  Skin: Skin is warm, dry and intact. No rash noted. She is not diaphoretic. No erythema. No pallor.  Psychiatric: She has a normal mood and affect. Her speech is normal and behavior is normal. Judgment and thought content normal. Cognition and memory are normal.  Nursing note and vitals reviewed.    Shoulder: right    Inspection: mild atrophy to R shoulder on the trap, R shoulder pulling forward, pec major spasm     Tenderness to Palpation:    Acromion: yes    AC joint:no    Clavicle: no    Bicipital groove:  yes    Scapular spine: yes    Coracoid process: yes    Humeral head: yes    Supraspinatus tendon: yes     Range of Motion:     Abduction:Decreased    Adduction: Normal    Flexion: Decreased    Extension: Decreased    Internal rotation: Decreased    External rotation: Decreased    Painful arc: yes     Muscle Strength: 5/5 bilaterally     Neuro: Upper extremity reflexes WNL. Abnormal sensation to light touch of C5-6 and T1     Special Tests:     Neer sign: Positive     Hawkins sign: Positive    Cross arm adduction: Negative    Yergason sign: Negative    O'brien sign: Positive     Speed sign: Negative  Results for orders placed or performed in visit on 11/04/15  Microscopic Examination  Result Value Ref Range   WBC, UA 0-5 0 -  5 /hpf   RBC, UA 0-2 0 -  2 /hpf   Epithelial Cells (non renal) 0-10 0 - 10 /hpf   Mucus, UA Present Not Estab.   Bacteria, UA Few None seen/Few  Microalbumin / creatinine urine ratio  Result Value Ref Range   Creatinine, Urine 58.7 Not Estab. mg/dL   Microalbum.,U,Random 113.7 Not Estab. ug/mL   MICROALB/CREAT RATIO 193.7 (H) 0.0 - 30.0 mg/g creat  UA/M w/rflx Culture, Routine  Result Value Ref Range   Specific Gravity, UA 1.015 1.005 - 1.030   pH, UA 7.0 5.0 - 7.5   Color, UA Yellow Yellow   Appearance Ur Clear Clear   Leukocytes, UA Negative Negative   Protein, UA Trace Negative/Trace   Glucose, UA Negative Negative   Ketones, UA Negative Negative   RBC, UA Trace (A) Negative   Bilirubin, UA Negative Negative   Urobilinogen, Ur 0.2 0.2 - 1.0 mg/dL   Nitrite, UA Negative Negative   Microscopic Examination See below:   CBC with Differential/Platelet  Result Value Ref Range   WBC 8.1 3.4 - 10.8 x10E3/uL   RBC 4.45 3.77 - 5.28 x10E6/uL   Hemoglobin 14.5 11.1 - 15.9 g/dL   Hematocrit 41.9 34.0 - 46.6 %   MCV 94 79 - 97 fL   MCH 32.6 26.6 - 33.0 pg   MCHC 34.6 31.5 - 35.7 g/dL   RDW 12.9 12.3 - 15.4 %   Platelets 235 150 - 379 x10E3/uL   Neutrophils 58 %   Lymphs 30 %   Monocytes 8 %   Eos 4 %   Basos 0 %   Neutrophils Absolute 4.7 1.4 - 7.0 x10E3/uL   Lymphocytes Absolute 2.4 0.7 - 3.1 x10E3/uL   Monocytes Absolute 0.6 0.1 - 0.9 x10E3/uL   EOS (ABSOLUTE) 0.4 0.0 - 0.4 x10E3/uL   Basophils Absolute 0.0 0.0 - 0.2 x10E3/uL   Immature Granulocytes 0 %   Immature Grans (Abs) 0.0 0.0 - 0.1 x10E3/uL  Lipid Panel w/o Chol/HDL Ratio  Result Value Ref Range   Cholesterol, Total 206 (H) 100 - 199 mg/dL    Triglycerides 83 0 - 149 mg/dL   HDL 78 >39 mg/dL   VLDL Cholesterol Cal 17 5 - 40 mg/dL   LDL Calculated 111 (H) 0 - 99 mg/dL  Comprehensive metabolic panel  Result Value Ref Range   Glucose 108 (H) 65 - 99 mg/dL   BUN 16 8 - 27 mg/dL   Creatinine, Ser 0.87 0.57 - 1.00 mg/dL  GFR calc non Af Amer 66 >59 mL/min/1.73   GFR calc Af Amer 76 >59 mL/min/1.73   BUN/Creatinine Ratio 18 11 - 26   Sodium 139 134 - 144 mmol/L   Potassium 4.0 3.5 - 5.2 mmol/L   Chloride 100 96 - 106 mmol/L   CO2 23 18 - 29 mmol/L   Calcium 9.5 8.7 - 10.3 mg/dL   Total Protein 6.9 6.0 - 8.5 g/dL   Albumin 4.5 3.5 - 4.8 g/dL   Globulin, Total 2.4 1.5 - 4.5 g/dL   Albumin/Globulin Ratio 1.9 1.2 - 2.2   Bilirubin Total 0.3 0.0 - 1.2 mg/dL   Alkaline Phosphatase 72 39 - 117 IU/L   AST 20 0 - 40 IU/L   ALT 23 0 - 32 IU/L  VITAMIN D 25 Hydroxy (Vit-D Deficiency, Fractures)  Result Value Ref Range   Vit D, 25-Hydroxy 23.6 (L) 30.0 - 100.0 ng/mL      Assessment & Plan:   Problem List Items Addressed This Visit      Other   Shoulder pain, right - Primary    Known arthritis. It looks like she has some impingement going on as well. Does not want to see surgery. Has been having steroid injections too often. Had been taking ibuprofen on top of meloxicam. Stop meloxicam. Stop ibuprofen. Start naproxen. Exercises for her shoulder given today. Never to the point of pain, only to the point of stretch. If not significantly better or worse when she gets back from her trip we will get her into PT and consider MRI based on her symptoms. Consider lidocaine patches. Continue salanpas patches. Call with any concerns.           Follow up plan: Return When she's back from her trip for follow up shoulder, for September for HTN follow up.

## 2016-02-18 ENCOUNTER — Emergency Department
Admission: EM | Admit: 2016-02-18 | Discharge: 2016-02-18 | Disposition: A | Discharge status: Home / Self Care | Payer: Medicare Other | Attending: Emergency Medicine | Admitting: Emergency Medicine

## 2016-02-18 DIAGNOSIS — E785 Hyperlipidemia, unspecified: Secondary | ICD-10-CM | POA: Diagnosis not present

## 2016-02-18 DIAGNOSIS — Z7951 Long term (current) use of inhaled steroids: Secondary | ICD-10-CM | POA: Diagnosis not present

## 2016-02-18 DIAGNOSIS — N182 Chronic kidney disease, stage 2 (mild): Secondary | ICD-10-CM | POA: Insufficient documentation

## 2016-02-18 DIAGNOSIS — I129 Hypertensive chronic kidney disease with stage 1 through stage 4 chronic kidney disease, or unspecified chronic kidney disease: Secondary | ICD-10-CM | POA: Diagnosis not present

## 2016-02-18 DIAGNOSIS — Z87891 Personal history of nicotine dependence: Secondary | ICD-10-CM | POA: Insufficient documentation

## 2016-02-18 DIAGNOSIS — M25511 Pain in right shoulder: Secondary | ICD-10-CM

## 2016-02-18 DIAGNOSIS — F329 Major depressive disorder, single episode, unspecified: Secondary | ICD-10-CM | POA: Insufficient documentation

## 2016-02-18 DIAGNOSIS — Z7982 Long term (current) use of aspirin: Secondary | ICD-10-CM | POA: Diagnosis not present

## 2016-02-18 DIAGNOSIS — M1990 Unspecified osteoarthritis, unspecified site: Secondary | ICD-10-CM | POA: Diagnosis not present

## 2016-02-18 NOTE — ED Provider Notes (Addendum)
[facility] Emergency Department Provider Note   ____________________________________________  Time seen: Approximately [id number] PM  I have reviewed the triage vital signs and the nursing notes.   HISTORY  Chief Complaint Shoulder Pain and Hypertension   HPI Cynthia Vaughan is a 75 y.o. female with a history of acromioclavicular arthritis was treated in with right shoulder pain. She said that she has had multiple steroid shots to the right shoulder but stopped it several months ago due to "reaching the limit of steroid shots." She says that her right shoulder hurts especially with movement. Denies any numbness. Says that the pain is moderate to severe and radiates up into her neck. She has been tried on multiple anti-inflammatories at home and is currently on meloxicam as well as wearing a salon pas patch.  She says that she also uses a heating pad. Denies any numbness distally. Says that she has a family history of arthritis and her brother who is been handicapped secondary to arthritis. Denies any chest pain or shortness of breath. Was seen earlier today at the [facility] clinic and sent over to the emergency department for further evaluation and pain control. Note from earlier today reveals that the patient did not want to see the surgeon.   Past Medical History  Diagnosis Date  . Allergy   . Anxiety   . Depression   . Hyperlipidemia   . Hypertension   . Hypopotassemia   . Osteoarthritis   . Urinary incontinence   . Osteopenia   . Vitamin D deficiency disease   . Microalbuminuria   . CKD (chronic kidney disease) stage 2, GFR 60-89 ml/min   . Heart murmur   . GERD (gastroesophageal reflux disease)   . Cancer ([place])     SKIN  . Edema     LEGS/FEET  . Wheezing     Patient Active Problem List   Diagnosis Date Noted  . Rotator cuff impingement syndrome of right shoulder 12/08/2015  . Dysuria 11/04/2015  . Medication monitoring encounter 11/04/2015  .  Wrist pain, left 07/07/2015  . Shoulder pain, right 07/07/2015  . NSAID long-term use 07/07/2015  . Left foot pain 04/23/2015  . Anxiety   . Depression   . Hyperlipidemia   . Hypertension   . Osteoarthritis   . Urinary incontinence   . Osteopenia   . Vitamin D deficiency disease   . Microalbuminuria   . CKD (chronic kidney disease) stage 2, GFR 60-89 ml/min     Past Surgical History  Procedure Laterality Date  . Abdominal hysterectomy  2004    Total (due to bladder surgery)  . Bladder surgery      x 2  . Bunionectomy    . Colonoscopy    . Cataract extraction w/phaco Right 11/23/2015    Procedure: CATARACT EXTRACTION PHACO AND INTRAOCULAR LENS PLACEMENT (IOC);  Surgeon: Estill Cotta, MD;  Location: [facility] ORS;  Service: Ophthalmology;  Laterality: Right;  [place]    1:30.9 AP%  26.9 CDE   42.29 flud casette lot # [id number]  exp05/31/2018    Current Outpatient Rx  Name  Route  Sig  Dispense  Refill  . amLODipine (NORVASC) 10 MG tablet      TAKE 1 TABLET BY MOUTH EVERY DAY   90 tablet   3   . aspirin EC 81 MG tablet   Oral   Take 81 mg by mouth daily.         [name] atorvastatin (LIPITOR) 40 MG  tablet      TAKE 1 TABLET BY MOUTH EVERY NIGHT AT BEDTIME   30 tablet   0   . busPIRone (BUSPAR) 15 MG tablet   Oral   Take 15 mg by mouth daily.      5   . Cholecalciferol (VITAMIN D-3 PO)   Oral   Take by mouth daily.         . fluticasone (FLONASE) 50 MCG/ACT nasal spray   Each Nare   Place 2 sprays into both nostrils daily.   16 g   11   . losartan (COZAAR) 50 MG tablet      TAKE 1 TABLET BY MOUTH EVERY DAY   30 tablet   4   . naproxen (NAPROSYN) 500 MG tablet   Oral   Take 1 tablet (500 mg total) by mouth 2 (two) times daily with a meal.   180 tablet   1   . ranitidine (ZANTAC) 150 MG tablet      TK 2 TS PO QPM.      5   . sertraline (ZOLOFT) 100 MG tablet   Oral   Take 1 tablet (100 mg total) by mouth daily.   30 tablet   5      Allergies Codeine and Lisinopril  Family History  Problem Relation Age of Onset  . AAA (abdominal aortic aneurysm) Mother   . Arthritis Brother   . Heart disease Brother   . Stroke Daughter   . Diabetes Son   . Stroke Son   . Seizures Son   . Diabetes Brother   . Heart disease Brother     7 total heart attacks  . Hyperlipidemia Brother   . Hypertension Brother   . Dementia Brother   . AAA (abdominal aortic aneurysm) Brother   . Stroke Son   . Dementia Sister   . Heart disease Brother     Social History Social History  Substance Use Topics  . Smoking status: Former Research scientist (life sciences)  . Smokeless tobacco: Never Used     Comment: quit many years ago   . Alcohol Use: Yes     Comment: OCCAS    Review of Systems Constitutional: No fever/chills Eyes: No visual changes. ENT: No sore throat. Cardiovascular: Denies chest pain. Respiratory: Denies shortness of breath. Gastrointestinal: No abdominal pain.  No nausea, no vomiting.  No diarrhea.  No constipation. Genitourinary: Negative for dysuria. Musculoskeletal: Negative for back pain. Skin: Negative for rash. Neurological: Negative for headaches, focal weakness or numbness.  10-point ROS otherwise negative.  ____________________________________________   PHYSICAL EXAM:  VITAL SIGNS: ED Triage Vitals  Enc Vitals Group     BP 02/18/16 1355 156/82 mmHg     Pulse Rate 02/18/16 1355 95     Resp 02/18/16 1355 18     Temp 02/18/16 1355 98.3 F (36.8 C)     Temp Source 02/18/16 1355 Oral     SpO2 02/18/16 1355 95 %     Weight 02/18/16 1355 147 lb (66.679 kg)     Height 02/18/16 1355 5' (1.524 m)     Head Cir --      Peak Flow --      Pain Score 02/18/16 1355 10     Pain Loc --      Pain Edu? --      Excl. in [place]? --     Constitutional: Alert and oriented. Well appearing and in no acute distress. Eyes: Conjunctivae are normal. PERRL. EOMI.  Head: Atraumatic. Nose: No congestion/rhinnorhea. Mouth/Throat: Mucous  membranes are moist.   Neck: No stridor.  The tenderness palpation in midline or laterally bilaterally. No step-off or deformity. Patient ranges the neck freely without any signs for stricture pain. Cardiovascular: Normal rate, regular rhythm. Grossly normal heart sounds.  Good peripheral circulation With equal and bilateral radial pulses. Respiratory: Normal respiratory effort.  No retractions. Lungs CTAB. Gastrointestinal: Soft and nontender. No distention. Musculoskeletal: No lower extremity tenderness nor edema.  No joint effusions. Right shoulder with tenderness over the before meals joint. No deformity. Patient with 5 out of 5 strength bilateral upper extremities. Neurologic:  Normal speech and language. No gross focal neurologic deficits are appreciated.  Skin:  Skin is warm, dry and intact. No rash noted. Psychiatric: Mood and affect are normal. Speech and behavior are normal.  ____________________________________________   LABS (all labs ordered are listed, but only abnormal results are displayed)  Labs Reviewed - No data to display ____________________________________________  EKG   ____________________________________________  RADIOLOGY   ____________________________________________   PROCEDURES   ____________________________________________   INITIAL IMPRESSION / ASSESSMENT AND PLAN / ED COURSE  Pertinent labs & imaging results that were available during my care of the patient were reviewed by me and considered in my medical decision making (see chart for details).  Patient says that she would like referral to a surgeon. She does not appear in any distress at this time. Her blood pressure is not Spiriva change her meds either. I'm not sure why her earlier note said that she did not want to speak to a surgeon but I feel that she is to the point where she is failed multiple treatments and would likely require evaluation by a tick surgeon. She says she does not have  an orthopedic surgeon.  She says that she is also following up with her primary care doctor this coming Monday. Will be discharged home. Says that she also had an x-ray several weeks ago at her primary care doctor's office. I'm unable to find the record. However, we discussed re-x-raying the shoulder today the patient does not want this because of her recent x-ray. I believe that given the chronic nature of the symptoms that repeating an x-ray would have little yield at this time. The patient denied any new injury to the right shoulder. ____________________________________________   FINAL CLINICAL IMPRESSION(S) / ED DIAGNOSES  acute on chronic pain likely secondary to arthritis to the right shoulder. Hypertension.    NEW MEDICATIONS STARTED DURING THIS VISIT:  New Prescriptions   No medications on file     Note:  This document was prepared using Dragon voice recognition software and may include unintentional dictation errors.    Orbie Pyo, MD 02/18/16 1610  ED ECG REPORT I, Doran Stabler, the attending physician, personally viewed and interpreted this ECG.   Date: 02/18/2016  EKG Time: 1359  Rate: 92  Rhythm: normal sinus rhythm  Axis: Normal  Intervals:none  ST&T Change: No ST segment elevation or depression. No abnormal T-wave inversion.   Orbie Pyo, MD 02/18/16 [phone]

## 2016-02-18 NOTE — Discharge Instructions (Signed)

## 2016-02-18 NOTE — ED Notes (Signed)
Pt arrives to ER via POV c/o right sided shoulder pain intermittently X 4-5 months. Pt states that she usually receives injections by Dr. Marzetta Board for shoulder pain. States that this last episode of pain to right shoulder has been occuring for approx 3-4 days. Denies CP or SOB. Pt alert and oriented X4, active, cooperative, pt in NAD. RR even and unlabored, color WNL.

## 2016-02-22 ENCOUNTER — Ambulatory Visit (INDEPENDENT_AMBULATORY_CARE_PROVIDER_SITE_OTHER): Payer: Medicare Other | Admitting: Family Medicine

## 2016-02-22 ENCOUNTER — Encounter: Payer: Self-pay | Admitting: Family Medicine

## 2016-02-22 VITALS — BP 125/79 | HR 82 | Temp 98.1°F | Ht 59.0 in | Wt 150.0 lb

## 2016-02-22 DIAGNOSIS — M75101 Unspecified rotator cuff tear or rupture of right shoulder, not specified as traumatic: Secondary | ICD-10-CM | POA: Diagnosis not present

## 2016-02-22 DIAGNOSIS — M7541 Impingement syndrome of right shoulder: Secondary | ICD-10-CM

## 2016-02-22 NOTE — Progress Notes (Signed)
BP 125/79 mmHg  Pulse 82  Temp(Src) 98.1 F (36.7 C)  Ht 4\' 11"  (1.499 m)  Wt 150 lb (68.04 kg)  BMI 30.28 kg/m2  SpO2 98%  LMP  (LMP Unknown)   Subjective:    Patient ID: Cynthia Vaughan, female    DOB: 12-10-1940, 75 y.o.   MRN: [id number]  HPI: Cynthia Vaughan is a 75 y.o. female  Chief Complaint  Patient presents with  . Shoulder Pain    right   SHOULDER PAIN- shoulder has been doing worse. Still having to pick up her daughter's wheel chair and having issues with that. Thinking that she might need to put her somewhere, but doesn't know how to move forward with that.  Duration: chronic Involved shoulder: right Mechanism of injury: unknown Location: posterior Onset:gradual Severity: severe  Quality:  "pain" Frequency: constant Radiation: yes, into her neck and down into her hand  Aggravating factors:lifting groceries   Alleviating factors: ibuprofen helps, shots for a small amount of time, salanpas patches   Status: worse Treatments attempted: meloxicam, rest, ice, heat, APAP, ibuprofen and aleve, exercises- not doing them now done them years ago  Relief with NSAIDs?:  mild Weakness: yes Numbness: yes Decreased grip strength: yes Redness: no Swelling: no Bruising: no Fevers: no  Relevant past medical, surgical, family and social history reviewed and updated as indicated. Interim medical history since our last visit reviewed. Allergies and medications reviewed and updated.  Review of Systems  Respiratory: Negative.   Cardiovascular: Negative.   Musculoskeletal: Positive for myalgias, joint swelling, arthralgias, neck pain and neck stiffness. Negative for back pain and gait problem.  Neurological: Positive for weakness and numbness. Negative for dizziness, tremors, seizures, syncope, facial asymmetry, speech difficulty, light-headedness and headaches.  Psychiatric/Behavioral: Negative.     Per HPI unless specifically indicated above     Objective:    BP  125/79 mmHg  Pulse 82  Temp(Src) 98.1 F (36.7 C)  Ht 4\' 11"  (1.499 m)  Wt 150 lb (68.04 kg)  BMI 30.28 kg/m2  SpO2 98%  LMP  (LMP Unknown)  Wt Readings from Last 3 Encounters:  02/22/16 150 lb (68.04 kg)  02/18/16 147 lb (66.679 kg)  01/15/16 146 lb (66.225 kg)    Physical Exam  Constitutional: She is oriented to person, place, and time. She appears well-developed and well-nourished. No distress.  HENT:  Head: Normocephalic and atraumatic.  Right Ear: Hearing normal.  Left Ear: Hearing normal.  Nose: Nose normal.  Eyes: Conjunctivae and lids are normal. Right eye exhibits no discharge. Left eye exhibits no discharge. No scleral icterus.  Pulmonary/Chest: Effort normal. No respiratory distress.  Neurological: She is alert and oriented to person, place, and time.  Skin: Skin is warm, dry and intact. No rash noted. She is not diaphoretic. No erythema. No pallor.  Psychiatric: She has a normal mood and affect. Her speech is normal and behavior is normal. Judgment and thought content normal. Cognition and memory are normal.  Nursing note and vitals reviewed.     Shoulder: right    Inspection:  no swelling, ecchymosis, erythema or step off deformity. slight atrophy medial to the scapula     Tenderness to Palpation:    Acromion: no    AC joint:no    Clavicle: no    Bicipital groove: yes    Scapular spine: yes    Coracoid process: no    Humeral head: yes    Supraspinatus tendon: yes     Range of  Motion:     Abduction:Decreased    Adduction: Decreased    Flexion: Decreased    Extension: Decreased    Internal rotation: Decreased    External rotation: Decreased    Painful arc: yes     Muscle Strength: 5/5 bilaterally      Neuro: Sensation WNL. and Upper extremity reflexes WNL.     Special Tests:     Neer sign: Equivocal    Hawkins sign: Positive    Cross arm adduction: Positive    Yergason sign: Negative    O'brien sign: Positive    Results for orders placed or  performed in visit on 11/04/15  Microscopic Examination  Result Value Ref Range   WBC, UA 0-5 0 -  5 /hpf   RBC, UA 0-2 0 -  2 /hpf   Epithelial Cells (non renal) 0-10 0 - 10 /hpf   Mucus, UA Present Not Estab.   Bacteria, UA Few None seen/Few  Microalbumin / creatinine urine ratio  Result Value Ref Range   Creatinine, Urine 58.7 Not Estab. mg/dL   Microalbum.,U,Random 113.7 Not Estab. ug/mL   MICROALB/CREAT RATIO 193.7 (H) 0.0 - 30.0 mg/g creat  UA/M w/rflx Culture, Routine  Result Value Ref Range   Specific Gravity, UA 1.015 1.005 - 1.030   pH, UA 7.0 5.0 - 7.5   Color, UA Yellow Yellow   Appearance Ur Clear Clear   Leukocytes, UA Negative Negative   Protein, UA Trace Negative/Trace   Glucose, UA Negative Negative   Ketones, UA Negative Negative   RBC, UA Trace (A) Negative   Bilirubin, UA Negative Negative   Urobilinogen, Ur 0.2 0.2 - 1.0 mg/dL   Nitrite, UA Negative Negative   Microscopic Examination See below:   CBC with Differential/Platelet  Result Value Ref Range   WBC 8.1 3.4 - 10.8 x10E3/uL   RBC 4.45 3.77 - 5.28 x10E6/uL   Hemoglobin 14.5 11.1 - 15.9 g/dL   Hematocrit 41.9 34.0 - 46.6 %   MCV 94 79 - 97 fL   MCH 32.6 26.6 - 33.0 pg   MCHC 34.6 31.5 - 35.7 g/dL   RDW 12.9 12.3 - 15.4 %   Platelets 235 150 - 379 x10E3/uL   Neutrophils 58 %   Lymphs 30 %   Monocytes 8 %   Eos 4 %   Basos 0 %   Neutrophils Absolute 4.7 1.4 - 7.0 x10E3/uL   Lymphocytes Absolute 2.4 0.7 - 3.1 x10E3/uL   Monocytes Absolute 0.6 0.1 - 0.9 x10E3/uL   EOS (ABSOLUTE) 0.4 0.0 - 0.4 x10E3/uL   Basophils Absolute 0.0 0.0 - 0.2 x10E3/uL   Immature Granulocytes 0 %   Immature Grans (Abs) 0.0 0.0 - 0.1 x10E3/uL  Lipid Panel w/o Chol/HDL Ratio  Result Value Ref Range   Cholesterol, Total 206 (H) 100 - 199 mg/dL   Triglycerides 83 0 - 149 mg/dL   HDL 78 >39 mg/dL   VLDL Cholesterol Cal 17 5 - 40 mg/dL   LDL Calculated 111 (H) 0 - 99 mg/dL  Comprehensive metabolic panel  Result Value  Ref Range   Glucose 108 (H) 65 - 99 mg/dL   BUN 16 8 - 27 mg/dL   Creatinine, Ser 0.87 0.57 - 1.00 mg/dL   GFR calc non Af Amer 66 >59 mL/min/1.73   GFR calc Af Amer 76 >59 mL/min/1.73   BUN/Creatinine Ratio 18 11 - 26   Sodium 139 134 - 144 mmol/L   Potassium 4.0 3.5 - 5.2  mmol/L   Chloride 100 96 - 106 mmol/L   CO2 23 18 - 29 mmol/L   Calcium 9.5 8.7 - 10.3 mg/dL   Total Protein 6.9 6.0 - 8.5 g/dL   Albumin 4.5 3.5 - 4.8 g/dL   Globulin, Total 2.4 1.5 - 4.5 g/dL   Albumin/Globulin Ratio 1.9 1.2 - 2.2   Bilirubin Total 0.3 0.0 - 1.2 mg/dL   Alkaline Phosphatase 72 39 - 117 IU/L   AST 20 0 - 40 IU/L   ALT 23 0 - 32 IU/L  VITAMIN D 25 Hydroxy (Vit-D Deficiency, Fractures)  Result Value Ref Range   Vit D, 25-Hydroxy 23.6 (L) 30.0 - 100.0 ng/mL      Assessment & Plan:   Problem List Items Addressed This Visit      Musculoskeletal and Integument   Rotator cuff impingement syndrome of right shoulder - Primary    Willing to see orthopedics. Referral made today. May need MRI or PT, but will wait for ortho on that. Call with any concerns. Stretches given to patient today.      Relevant Orders   Ambulatory referral to Orthopedic Surgery       Follow up plan: Return if symptoms worsen or fail to improve.

## 2016-02-22 NOTE — Assessment & Plan Note (Signed)
Willing to see orthopedics. Referral made today. May need MRI or PT, but will wait for ortho on that. Call with any concerns. Stretches given to patient today.

## 2016-02-22 NOTE — Patient Instructions (Signed)

## 2016-03-02 DIAGNOSIS — M7521 Bicipital tendinitis, right shoulder: Secondary | ICD-10-CM | POA: Diagnosis not present

## 2016-03-25 ENCOUNTER — Ambulatory Visit: Payer: Medicare Other | Admitting: Family Medicine

## 2016-04-11 ENCOUNTER — Other Ambulatory Visit: Payer: Self-pay | Admitting: Family Medicine

## 2016-04-24 ENCOUNTER — Other Ambulatory Visit: Payer: Self-pay | Admitting: Family Medicine

## 2016-04-28 ENCOUNTER — Other Ambulatory Visit: Payer: Self-pay | Admitting: Family Medicine

## 2016-05-05 ENCOUNTER — Encounter: Payer: Self-pay | Admitting: Family Medicine

## 2016-05-05 ENCOUNTER — Ambulatory Visit (INDEPENDENT_AMBULATORY_CARE_PROVIDER_SITE_OTHER): Payer: Medicare Other | Admitting: Family Medicine

## 2016-05-05 ENCOUNTER — Ambulatory Visit
Admission: RE | Admit: 2016-05-05 | Discharge: 2016-05-05 | Disposition: A | Discharge status: Home / Self Care | Payer: Medicare Other | Source: Ambulatory Visit | Attending: Family Medicine | Admitting: Family Medicine

## 2016-05-05 VITALS — BP 128/83 | HR 68 | Temp 97.8°F | Wt 147.0 lb

## 2016-05-05 DIAGNOSIS — M25551 Pain in right hip: Secondary | ICD-10-CM

## 2016-05-05 DIAGNOSIS — M2550 Pain in unspecified joint: Secondary | ICD-10-CM

## 2016-05-05 DIAGNOSIS — M47896 Other spondylosis, lumbar region: Secondary | ICD-10-CM | POA: Insufficient documentation

## 2016-05-05 NOTE — Progress Notes (Signed)
BP 128/83 (BP Location: Left Arm, Patient Position: Sitting, Cuff Size: Normal)   Pulse 68   Temp 97.8 F (36.6 C)   Wt 147 lb (66.7 kg)   LMP  (LMP Unknown)   SpO2 95%   BMI 29.69 kg/m    Subjective:    Patient ID: Cynthia Vaughan, female    DOB: [birth year], 75 y.o.   MRN: [id number]  HPI: Cynthia Vaughan is a 75 y.o. female  Chief Complaint  Patient presents with  . Hip Pain    right   HIP PAIN Duration: several years, however her ankles have been acting up for the past couple of weeks as well and has been feeling a little dizzy and not like herself Involved hip: right  Mechanism of injury: unknown Location: lateral Onset: gradual  Severity: severe  Quality: aching Frequency: constant Radiation: yes, down her leg Aggravating factors: squatting, standing on her tip toes, stress    Alleviating factors: patches   Status: fluctuating Treatments attempted: meloxicam, naproxen, ice, heat    Relief with NSAIDs?: mild Weakness with weight bearing: yes Weakness with walking: yes Paresthesias / decreased sensation: yes- in her feet Swelling: yes- in her feet Redness:no Fevers: no  Relevant past medical, surgical, family and social history reviewed and updated as indicated. Interim medical history since our last visit reviewed. Allergies and medications reviewed and updated.  Review of Systems  Constitutional: Positive for chills and fatigue. Negative for activity change, appetite change, diaphoresis, fever and unexpected weight change.  Respiratory: Negative.   Cardiovascular: Negative.   Musculoskeletal: Positive for arthralgias, gait problem, joint swelling and myalgias. Negative for back pain, neck pain and neck stiffness.  Psychiatric/Behavioral: Negative.     Per HPI unless specifically indicated above     Objective:    BP 128/83 (BP Location: Left Arm, Patient Position: Sitting, Cuff Size: Normal)   Pulse 68   Temp 97.8 F (36.6 C)   Wt 147 lb (66.7  kg)   LMP  (LMP Unknown)   SpO2 95%   BMI 29.69 kg/m   Wt Readings from Last 3 Encounters:  05/05/16 147 lb (66.7 kg)  02/22/16 150 lb (68 kg)  02/18/16 147 lb (66.7 kg)    Physical Exam  Constitutional: She is oriented to person, place, and time. She appears well-developed and well-nourished. No distress.  HENT:  Head: Normocephalic and atraumatic.  Right Ear: Hearing normal.  Left Ear: Hearing normal.  Nose: Nose normal.  Eyes: Conjunctivae and lids are normal. Right eye exhibits no discharge. Left eye exhibits no discharge. No scleral icterus.  Cardiovascular: Normal rate, regular rhythm, normal heart sounds and intact distal pulses.  Exam reveals no gallop and no friction rub.   No murmur heard. Pulmonary/Chest: Effort normal and breath sounds normal. No respiratory distress. She has no wheezes. She has no rales. She exhibits no tenderness.  Neurological: She is alert and oriented to person, place, and time.  Skin: Skin is warm, dry and intact. No rash noted. No erythema. No pallor.  Psychiatric: She has a normal mood and affect. Her speech is normal and behavior is normal. Judgment and thought content normal. Cognition and memory are normal.  Nursing note and vitals reviewed.   Results for orders placed or performed in visit on 11/04/15  Microscopic Examination  Result Value Ref Range   WBC, UA 0-5 0 - 5 /hpf   RBC, UA 0-2 0 - 2 /hpf   Epithelial Cells (non renal) 0-10 0 -  10 /hpf   Mucus, UA Present Not Estab.   Bacteria, UA Few None seen/Few  Microalbumin / creatinine urine ratio  Result Value Ref Range   Creatinine, Urine 58.7 Not Estab. mg/dL   Microalbum.,U,Random 113.7 Not Estab. ug/mL   MICROALB/CREAT RATIO 193.7 (H) 0.0 - 30.0 mg/g creat  UA/M w/rflx Culture, Routine  Result Value Ref Range   Specific Gravity, UA 1.015 1.005 - 1.030   pH, UA 7.0 5.0 - 7.5   Color, UA Yellow Yellow   Appearance Ur Clear Clear   Leukocytes, UA Negative Negative   Protein, UA  Trace Negative/Trace   Glucose, UA Negative Negative   Ketones, UA Negative Negative   RBC, UA Trace (A) Negative   Bilirubin, UA Negative Negative   Urobilinogen, Ur 0.2 0.2 - 1.0 mg/dL   Nitrite, UA Negative Negative   Microscopic Examination See below:   CBC with Differential/Platelet  Result Value Ref Range   WBC 8.1 3.4 - 10.8 x10E3/uL   RBC 4.45 3.77 - 5.28 x10E6/uL   Hemoglobin 14.5 11.1 - 15.9 g/dL   Hematocrit 41.9 34.0 - 46.6 %   MCV 94 79 - 97 fL   MCH 32.6 26.6 - 33.0 pg   MCHC 34.6 31.5 - 35.7 g/dL   RDW 12.9 12.3 - 15.4 %   Platelets 235 150 - 379 x10E3/uL   Neutrophils 58 %   Lymphs 30 %   Monocytes 8 %   Eos 4 %   Basos 0 %   Neutrophils Absolute 4.7 1.4 - 7.0 x10E3/uL   Lymphocytes Absolute 2.4 0.7 - 3.1 x10E3/uL   Monocytes Absolute 0.6 0.1 - 0.9 x10E3/uL   EOS (ABSOLUTE) 0.4 0.0 - 0.4 x10E3/uL   Basophils Absolute 0.0 0.0 - 0.2 x10E3/uL   Immature Granulocytes 0 %   Immature Grans (Abs) 0.0 0.0 - 0.1 x10E3/uL  Lipid Panel w/o Chol/HDL Ratio  Result Value Ref Range   Cholesterol, Total 206 (H) 100 - 199 mg/dL   Triglycerides 83 0 - 149 mg/dL   HDL 78 >39 mg/dL   VLDL Cholesterol Cal 17 5 - 40 mg/dL   LDL Calculated 111 (H) 0 - 99 mg/dL  Comprehensive metabolic panel  Result Value Ref Range   Glucose 108 (H) 65 - 99 mg/dL   BUN 16 8 - 27 mg/dL   Creatinine, Ser 0.87 0.57 - 1.00 mg/dL   GFR calc non Af Amer 66 >59 mL/min/1.73   GFR calc Af Amer 76 >59 mL/min/1.73   BUN/Creatinine Ratio 18 11 - 26   Sodium 139 134 - 144 mmol/L   Potassium 4.0 3.5 - 5.2 mmol/L   Chloride 100 96 - 106 mmol/L   CO2 23 18 - 29 mmol/L   Calcium 9.5 8.7 - 10.3 mg/dL   Total Protein 6.9 6.0 - 8.5 g/dL   Albumin 4.5 3.5 - 4.8 g/dL   Globulin, Total 2.4 1.5 - 4.5 g/dL   Albumin/Globulin Ratio 1.9 1.2 - 2.2   Bilirubin Total 0.3 0.0 - 1.2 mg/dL   Alkaline Phosphatase 72 39 - 117 IU/L   AST 20 0 - 40 IU/L   ALT 23 0 - 32 IU/L  VITAMIN D 25 Hydroxy (Vit-D Deficiency,  Fractures)  Result Value Ref Range   Vit D, 25-Hydroxy 23.6 (L) 30.0 - 100.0 ng/mL      Assessment & Plan:   Problem List Items Addressed This Visit    None    Visit Diagnoses    Arthralgia    -  Primary   With fatigue and new onset ankle issues. Will check for tick diseases. Await results.    Relevant Orders   Lyme Ab/Western Blot Reflex   Rocky mtn spotted fvr abs pnl(IgG+IgM)   Ehrlichia Antibody Panel   Babesia microti Antibody Panel   Right hip pain       Likely arthritis. Will obtain x-ray, await results. Continue current regimen. Continue to monitor.    Relevant Orders   DG HIP UNILAT WITH PELVIS 2-3 VIEWS RIGHT       Follow up plan: Return 2-3 weeks.

## 2016-05-06 ENCOUNTER — Telehealth: Payer: Self-pay

## 2016-05-06 NOTE — Telephone Encounter (Signed)
Please let her know that her hip was normal, but her back showed a lot of arthritis. I can send her to PT if she would like or I can send her to the spine doctor. Thanks.

## 2016-05-06 NOTE — Telephone Encounter (Signed)
Patient called to get the results of her xrays that she had done yesterday.

## 2016-05-06 NOTE — Telephone Encounter (Signed)
PT order written OK to send to PT

## 2016-05-06 NOTE — Telephone Encounter (Signed)
Patient would like to try PT. °

## 2016-05-09 LAB — EHRLICHIA ANTIBODY PANEL
E. CHAFFEENSIS (HME) IGM TITER: NEGATIVE
E. CHAFFEENSIS IGG AB: NEGATIVE
HGE IgG Titer: NEGATIVE
HGE IgM Titer: NEGATIVE

## 2016-05-09 LAB — BABESIA MICROTI ANTIBODY PANEL: Babesia microti IgG: <1:10 {titer}

## 2016-05-09 LAB — LYME AB/WESTERN BLOT REFLEX

## 2016-05-09 LAB — ROCKY MTN SPOTTED FVR ABS PNL(IGG+IGM)
RMSF IGM: 0.27 {index} (ref 0.00–0.89)
RMSF IgG: POSITIVE — AB

## 2016-05-09 LAB — RMSF, IGG, IFA

## 2016-05-10 ENCOUNTER — Telehealth: Payer: Self-pay | Admitting: Family Medicine

## 2016-05-10 DIAGNOSIS — A770 Spotted fever due to Rickettsia rickettsii: Secondary | ICD-10-CM

## 2016-05-10 HISTORY — DX: Spotted fever due to Rickettsia rickettsii: A77.0

## 2016-05-10 MED ORDER — DOXYCYCLINE HYCLATE 100 MG PO TABS: 100.0000 mg | ORAL_TABLET | Freq: Two times a day (BID) | ORAL | 0 refills | Status: DC

## 2016-05-10 NOTE — Telephone Encounter (Signed)
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 Form for the state filled out and faxed.

## 2016-05-19 ENCOUNTER — Encounter: Payer: Self-pay | Admitting: Family Medicine

## 2016-05-19 ENCOUNTER — Ambulatory Visit (INDEPENDENT_AMBULATORY_CARE_PROVIDER_SITE_OTHER): Payer: Medicare Other | Admitting: Family Medicine

## 2016-05-19 VITALS — BP 133/82 | HR 79 | Temp 99.4°F | Wt 146.0 lb

## 2016-05-19 DIAGNOSIS — A770 Spotted fever due to Rickettsia rickettsii: Secondary | ICD-10-CM

## 2016-05-19 DIAGNOSIS — R110 Nausea: Secondary | ICD-10-CM | POA: Diagnosis not present

## 2016-05-19 DIAGNOSIS — J40 Bronchitis, not specified as acute or chronic: Secondary | ICD-10-CM | POA: Diagnosis not present

## 2016-05-19 MED ORDER — ALBUTEROL SULFATE HFA 108 (90 BASE) MCG/ACT IN AERS: 2.0000 | INHALATION_SPRAY | Freq: Four times a day (QID) | RESPIRATORY_TRACT | 0 refills | Status: DC | PRN

## 2016-05-19 MED ORDER — ONDANSETRON 4 MG PO TBDP: 4.0000 mg | ORAL_TABLET | Freq: Two times a day (BID) | ORAL | 0 refills | Status: DC

## 2016-05-19 NOTE — Progress Notes (Signed)
BP 133/82 (BP Location: Left Arm, Patient Position: Sitting, Cuff Size: Normal)   Pulse 79   Temp 99.4 F (37.4 C)   Wt 146 lb (66.2 kg)   LMP  (LMP Unknown)   SpO2 96%   BMI 29.49 kg/m    Subjective:    Patient ID: Cynthia Vaughan, female    DOB: 1941-01-11, 75 y.o.   MRN: [id number]  HPI: Cynthia Vaughan is a 75 y.o. female  Chief Complaint  Patient presents with  . Joint Pain   Feeling much better with her joints. Not hurting as much any more. Tolerating her medication OK, pretty nauseous on it. Taking it with food. Has not thrown up, just decreased appetite.   Has also noticed that her chest feels tight. Has not had any fevers. No cough. No SOB. Otherwise feeling well.   Relevant past medical, surgical, family and social history reviewed and updated as indicated. Interim medical history since our last visit reviewed. Allergies and medications reviewed and updated.  Review of Systems  Constitutional: Negative.   Respiratory: Negative.   Cardiovascular: Negative.   Psychiatric/Behavioral: Negative.     Per HPI unless specifically indicated above     Objective:    BP 133/82 (BP Location: Left Arm, Patient Position: Sitting, Cuff Size: Normal)   Pulse 79   Temp 99.4 F (37.4 C)   Wt 146 lb (66.2 kg)   LMP  (LMP Unknown)   SpO2 96%   BMI 29.49 kg/m   Wt Readings from Last 3 Encounters:  05/19/16 146 lb (66.2 kg)  05/05/16 147 lb (66.7 kg)  02/22/16 150 lb (68 kg)    Physical Exam  Constitutional: She is oriented to person, place, and time. She appears well-developed and well-nourished. No distress.  HENT:  Head: Normocephalic and atraumatic.  Right Ear: Hearing normal.  Left Ear: Hearing normal.  Nose: Nose normal.  Eyes: Conjunctivae and lids are normal. Right eye exhibits no discharge. Left eye exhibits no discharge. No scleral icterus.  Cardiovascular: Normal rate, regular rhythm, normal heart sounds and intact distal pulses.  Exam reveals no gallop  and no friction rub.   No murmur heard. Pulmonary/Chest: Effort normal. No respiratory distress. She has decreased breath sounds in the right upper field, the right middle field, the right lower field, the left upper field, the left middle field and the left lower field. She has no wheezes. She has no rales. She exhibits no tenderness.  Musculoskeletal: Normal range of motion.  Neurological: She is alert and oriented to person, place, and time.  Skin: Skin is warm, dry and intact. No rash noted. No erythema. No pallor.  Psychiatric: She has a normal mood and affect. Her speech is normal and behavior is normal. Judgment and thought content normal. Cognition and memory are normal.  Nursing note and vitals reviewed.   Results for orders placed or performed in visit on 05/05/16  Lyme Ab/Western Blot Reflex  Result Value Ref Range   Lyme IgG/IgM Ab <0.91 0.00 - 0.90 ISR   LYME DISEASE AB, QUANT, IGM <0.80 0.00 - 0.79 index  Rocky mtn spotted fvr abs pnl(IgG+IgM)  Result Value Ref Range   RMSF IgG Positive (A) Negative   RMSF IgM 0.27 0.00 - 123456 index  Ehrlichia Antibody Panel  Result Value Ref Range   E.Chaffeensis (HME) IgG Negative Neg:<1:64   E. 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 On doxy already. Will start albuterol inhaler. Recheck lungs 3-4 weeks. Call with any concerns.    Nausea       Due to doxy. Will start zofran to help her tolerate it. Call with any concerns.        Follow up plan: Return in about 4  weeks (around 06/16/2016).

## 2016-05-24 ENCOUNTER — Other Ambulatory Visit: Payer: Self-pay | Admitting: Family Medicine

## 2016-05-29 ENCOUNTER — Other Ambulatory Visit: Payer: Self-pay | Admitting: Family Medicine

## 2016-06-01 ENCOUNTER — Other Ambulatory Visit: Payer: Self-pay | Admitting: Family Medicine

## 2016-06-10 ENCOUNTER — Other Ambulatory Visit: Payer: Self-pay | Admitting: Family Medicine

## 2016-06-10 ENCOUNTER — Ambulatory Visit: Payer: Medicare Other | Admitting: Family Medicine

## 2016-06-14 ENCOUNTER — Ambulatory Visit: Payer: Medicare Other | Admitting: Family Medicine

## 2016-06-21 ENCOUNTER — Ambulatory Visit (INDEPENDENT_AMBULATORY_CARE_PROVIDER_SITE_OTHER): Payer: Medicare Other | Admitting: Family Medicine

## 2016-06-21 ENCOUNTER — Encounter: Payer: Self-pay | Admitting: Family Medicine

## 2016-06-21 VITALS — BP 138/84 | HR 76 | Temp 98.1°F | Wt 146.5 lb

## 2016-06-21 DIAGNOSIS — A770 Spotted fever due to Rickettsia rickettsii: Secondary | ICD-10-CM

## 2016-06-21 DIAGNOSIS — F419 Anxiety disorder, unspecified: Secondary | ICD-10-CM

## 2016-06-21 DIAGNOSIS — J40 Bronchitis, not specified as acute or chronic: Secondary | ICD-10-CM

## 2016-06-21 MED ORDER — BUSPIRONE HCL 15 MG PO TABS: 15.0000 mg | ORAL_TABLET | Freq: Every day | ORAL | 3 refills | Status: DC

## 2016-06-21 NOTE — Progress Notes (Signed)
BP 138/84   Pulse 76   Temp 98.1 F (36.7 C)   Wt 146 lb 8 oz (66.5 kg)   LMP  (LMP Unknown)   SpO2 98%   BMI 29.59 kg/m    Subjective:    Patient ID: Cynthia Vaughan, female    DOB: [birth year], 75 y.o.   MRN: [id number]  HPI: Cynthia Vaughan is a 75 y.o. female  Chief Complaint  Patient presents with  . Lyme Disease  . Anxiety    Patient needs a refill on her buspirone   Feeling much better since finishing her meds for RMSF. Back to normal. Breathing better. Lungs clear. Bronchitis is gone. Otherwise feeling well.   ANXIETY/STRESS Duration:stable Anxious mood: yes  Excessive worrying: yes Irritability: no  Sweating: no Nausea: no Palpitations:no Hyperventilation: no Panic attacks: no Agoraphobia: no  Obscessions/compulsions: no Depressed mood: no Depression screen [facility] 2/9 06/21/2016 02/22/2016  Decreased Interest 1 0  Down, Depressed, Hopeless 1 1  PHQ - 2 Score 2 1   GAD 7 : Generalized Anxiety Score 06/21/2016  Nervous, Anxious, on Edge 2  Control/stop worrying 2  Worry too much - different things 2  Trouble relaxing 1  Restless 2  Easily annoyed or irritable 1  Afraid - awful might happen 1  Total GAD 7 Score 11  Anxiety Difficulty Somewhat difficult   Anhedonia: no Weight changes: no Insomnia: no   Hypersomnia: no Fatigue/loss of energy: yes Feelings of worthlessness: no Feelings of guilt: no Impaired concentration/indecisiveness: no Suicidal ideations: no  Crying spells: no Recent Stressors/Life Changes: yes   Relationship problems: no   Family stress: yes     Financial stress: yes    Job stress: no    Recent death/loss: no  Relevant past medical, surgical, family and social history reviewed and updated as indicated. Interim medical history since our last visit reviewed. Allergies and medications reviewed and updated.  Review of Systems  Constitutional: Negative.   Respiratory: Negative.   Cardiovascular: Negative.     Musculoskeletal: Negative.   Psychiatric/Behavioral: Negative for agitation, behavioral problems, confusion, decreased concentration, dysphoric mood, hallucinations, self-injury, sleep disturbance and suicidal ideas. The patient is nervous/anxious. The patient is not hyperactive.     Per HPI unless specifically indicated above     Objective:    BP 138/84   Pulse 76   Temp 98.1 F (36.7 C)   Wt 146 lb 8 oz (66.5 kg)   LMP  (LMP Unknown)   SpO2 98%   BMI 29.59 kg/m   Wt Readings from Last 3 Encounters:  06/21/16 146 lb 8 oz (66.5 kg)  05/19/16 146 lb (66.2 kg)  05/05/16 147 lb (66.7 kg)    Physical Exam  Constitutional: She is oriented to person, place, and time. She appears well-developed and well-nourished. No distress.  HENT:  Head: Normocephalic and atraumatic.  Right Ear: Hearing normal.  Left Ear: Hearing normal.  Nose: Nose normal.  Eyes: Conjunctivae and lids are normal. Right eye exhibits no discharge. Left eye exhibits no discharge. No scleral icterus.  Cardiovascular: Normal rate, regular rhythm, normal heart sounds and intact distal pulses.  Exam reveals no gallop and no friction rub.   No murmur heard. Pulmonary/Chest: Effort normal and breath sounds normal. No respiratory distress. She has no wheezes. She has no rales. She exhibits no tenderness.  Musculoskeletal: Normal range of motion.  Neurological: She is alert and oriented to person, place, and time.  Skin: Skin is warm, dry and  intact. No rash noted. No erythema. No pallor.  Psychiatric: She has a normal mood and affect. Her speech is normal and behavior is normal. Judgment and thought content normal. Cognition and memory are normal.  Nursing note and vitals reviewed.   Results for orders placed or performed in visit on 05/05/16  Lyme Ab/Western Blot Reflex  Result Value Ref Range   Lyme IgG/IgM Ab <0.91 0.00 - 0.90 ISR   LYME DISEASE AB, QUANT, IGM <0.80 0.00 - 0.79 index  Rocky mtn spotted fvr abs  pnl(IgG+IgM)  Result Value Ref Range   RMSF IgG Positive (A) Negative   RMSF IgM 0.27 0.00 - 123456 index  Ehrlichia Antibody Panel  Result Value Ref Range   E.Chaffeensis (HME) IgG Negative Neg:<1:64   E. Chaffeensis (HME) IgM Titer Negative Neg:<1:20   HGE IgG Titer Negative Neg:<1:64   HGE IgM Titer Negative Neg:<1:20  Babesia microti Antibody Panel  Result Value Ref Range   Babesia microti IgM <1:10 Neg:<1:10   Babesia microti IgG <1:10 Neg:<1:10  RMSF, IgG, IFA  Result Value Ref Range   RMSF, IGG, IFA 1:64 (H) Neg <1:64      Assessment & Plan:   Problem List Items Addressed This Visit      Other   Anxiety    Stable. Will refill her buspar. Recheck at follow up.      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 Call with any concerns.        Other Visit Diagnoses    Bronchitis       Resolved. Call with any concerns.        Follow up plan: Return in about 3 months (around 09/21/2016) for BP/Cholesterol follow up.

## 2016-06-21 NOTE — Assessment & Plan Note (Signed)
Feeling much better now that she has finished her abx. Call with any concerns.

## 2016-06-21 NOTE — Assessment & Plan Note (Signed)
Stable. Will refill her buspar. Recheck at follow up.

## 2016-06-25 ENCOUNTER — Other Ambulatory Visit: Payer: Self-pay | Admitting: Family Medicine

## 2016-06-28 ENCOUNTER — Telehealth: Payer: Self-pay | Admitting: Family Medicine

## 2016-06-29 ENCOUNTER — Other Ambulatory Visit: Payer: Self-pay | Admitting: Family Medicine

## 2016-06-29 NOTE — Telephone Encounter (Signed)
Verbal from Dr.Johnson, ok for patient to get her flu vaccine.

## 2016-07-04 DIAGNOSIS — Z23 Encounter for immunization: Secondary | ICD-10-CM | POA: Diagnosis not present

## 2016-07-05 ENCOUNTER — Other Ambulatory Visit: Payer: Self-pay | Admitting: Family Medicine

## 2016-07-10 ENCOUNTER — Other Ambulatory Visit: Payer: Self-pay | Admitting: Family Medicine

## 2016-07-17 ENCOUNTER — Other Ambulatory Visit: Payer: Self-pay | Admitting: Family Medicine

## 2016-07-27 ENCOUNTER — Ambulatory Visit (INDEPENDENT_AMBULATORY_CARE_PROVIDER_SITE_OTHER): Payer: Medicare Other | Admitting: Family Medicine

## 2016-07-27 ENCOUNTER — Encounter: Payer: Self-pay | Admitting: Family Medicine

## 2016-07-27 VITALS — BP 147/94 | HR 73 | Temp 97.5°F | Wt 146.0 lb

## 2016-07-27 DIAGNOSIS — K120 Recurrent oral aphthae: Secondary | ICD-10-CM

## 2016-07-27 MED ORDER — LIDOCAINE VISCOUS 2 % MT SOLN
5.0000 mL | OROMUCOSAL | 0 refills | Status: DC | PRN
Start: 2016-07-27 — End: 2016-09-20

## 2016-07-27 NOTE — Patient Instructions (Signed)
Follow up as needed

## 2016-07-27 NOTE — Progress Notes (Signed)
   BP (!) 147/94   Pulse 73   Temp 97.5 F (36.4 C)   Wt 146 lb (66.2 kg)   LMP  (LMP Unknown)   SpO2 96%   BMI 29.49 kg/m    Subjective:    Patient ID: Cynthia Vaughan, female    DOB: 05/26/1941, 75 y.o.   MRN: [id number]  HPI: Cynthia Vaughan is a 75 y.o. female  Chief Complaint  Patient presents with  . Oral Pain    x 3 days, ulcer like area under her tongue   Patient presents with 3 day history of painful mouth ulcer, left lower. Using mouthwash and chlorasceptic spray with some relief. States she has gotten these ulcers intermittently for a long time. Denies bleeding or drainage from ulcer. Attends regular 6 month dental checks. Cleans denture sets regularly.   Relevant past medical, surgical, family and social history reviewed and updated as indicated. Interim medical history since our last visit reviewed. Allergies and medications reviewed and updated.  Review of Systems  Constitutional: Negative.   HENT: Positive for mouth sores.   Respiratory: Negative.   Cardiovascular: Negative.   Gastrointestinal: Negative.   Genitourinary: Negative.   Musculoskeletal: Negative.   Neurological: Negative.   Psychiatric/Behavioral: Negative.     Per HPI unless specifically indicated above     Objective:    BP (!) 147/94   Pulse 73   Temp 97.5 F (36.4 C)   Wt 146 lb (66.2 kg)   LMP  (LMP Unknown)   SpO2 96%   BMI 29.49 kg/m   Wt Readings from Last 3 Encounters:  07/27/16 146 lb (66.2 kg)  06/21/16 146 lb 8 oz (66.5 kg)  05/19/16 146 lb (66.2 kg)    Physical Exam  Constitutional: She is oriented to person, place, and time. She appears well-developed and well-nourished.  HENT:  Head: Atraumatic.  Erythematous ulcer left lower area  Eyes: Conjunctivae are normal. Pupils are equal, round, and reactive to light. No scleral icterus.  Neck: Normal range of motion. Neck supple.  Cardiovascular: Normal rate.   Pulmonary/Chest: Effort normal. No respiratory distress.    Musculoskeletal: Normal range of motion.  Neurological: She is alert and oriented to person, place, and time.  Skin: Skin is warm and dry.  Psychiatric: She has a normal mood and affect. Her behavior is normal.  Nursing note and vitals reviewed.     Assessment & Plan:   Problem List Items Addressed This Visit    None    Visit Diagnoses    Aphthous ulcer of mouth    -  Primary   Viscous lidocaine sent. Can continue mouthwash rinses or salt water gargles. Tylenol for pain relief. Follow up for regular dental checks with dentist.        Follow up plan: Return if symptoms worsen or fail to improve.

## 2016-08-02 ENCOUNTER — Other Ambulatory Visit: Payer: Self-pay | Admitting: Family Medicine

## 2016-08-10 ENCOUNTER — Other Ambulatory Visit: Payer: Self-pay | Admitting: Family Medicine

## 2016-08-12 ENCOUNTER — Ambulatory Visit: Payer: Medicare Other | Admitting: Family Medicine

## 2016-08-17 ENCOUNTER — Other Ambulatory Visit: Payer: Self-pay

## 2016-08-17 ENCOUNTER — Telehealth: Payer: Self-pay | Admitting: Family Medicine

## 2016-08-17 MED ORDER — RANITIDINE HCL 150 MG PO TABS: ORAL_TABLET | ORAL | 5 refills | Status: DC

## 2016-08-17 NOTE — Telephone Encounter (Signed)
Routing to provider  

## 2016-08-17 NOTE — Telephone Encounter (Signed)
yours

## 2016-08-17 NOTE — Telephone Encounter (Signed)
Pt would like to know if she could have something for her shoulder pain sent to [facility].

## 2016-08-18 ENCOUNTER — Ambulatory Visit: Payer: Medicare Other | Admitting: Family Medicine

## 2016-08-19 MED ORDER — DICLOFENAC SODIUM 1 % TD GEL: 4.0000 g | Freq: Four times a day (QID) | TRANSDERMAL | 6 refills | Status: DC

## 2016-08-19 NOTE — Telephone Encounter (Signed)
Rx sent to her pharmacy 

## 2016-08-25 ENCOUNTER — Encounter: Payer: Self-pay | Admitting: Family Medicine

## 2016-08-25 ENCOUNTER — Ambulatory Visit (INDEPENDENT_AMBULATORY_CARE_PROVIDER_SITE_OTHER): Payer: Medicare Other | Admitting: Family Medicine

## 2016-08-25 VITALS — BP 144/84 | HR 78 | Temp 98.0°F | Wt 145.2 lb

## 2016-08-25 DIAGNOSIS — M7061 Trochanteric bursitis, right hip: Secondary | ICD-10-CM

## 2016-08-25 DIAGNOSIS — M25561 Pain in right knee: Secondary | ICD-10-CM | POA: Diagnosis not present

## 2016-08-25 DIAGNOSIS — M7541 Impingement syndrome of right shoulder: Secondary | ICD-10-CM

## 2016-08-25 NOTE — Assessment & Plan Note (Signed)
Still no better. Will get back into orthopedics. Appointment made today. Call with any concerns.

## 2016-08-25 NOTE — Progress Notes (Signed)
BP (!) 144/84 (BP Location: Right Arm, Patient Position: Sitting, Cuff Size: Normal)   Pulse 78   Temp 98 F (36.7 C)   Wt 145 lb 3.2 oz (65.9 kg)   LMP  (LMP Unknown)   SpO2 95%   BMI 29.33 kg/m    Subjective:    Patient ID: Cynthia Vaughan, female    DOB: [birth year], 76 y.o.   MRN: [id number]  HPI: Cynthia Vaughan is a 76 y.o. female  Chief Complaint  Patient presents with  . Shoulder Pain    Right  . Hip Pain    Right  . Knee Pain    Right   SHOULDER PAIN- saw the orthopedist in the summer and he made her shoulder feel better with a shot, hasn't been back to see him Duration: chronic Involved shoulder: right Mechanism of injury: unknown Location: posterior Onset:gradual Severity: severe  Quality:  sharp Frequency: putting on a coat, laying on it, picking something up Radiation: yes- down to her fingers Aggravating factors: sleep  Alleviating factors: nothing  Status: worse Treatments attempted: salonpas, rest, ice, heat, sling, APAP, ibuprofen and aleve  Relief with NSAIDs?:  mild Weakness: yes Numbness: yes Decreased grip strength: yes Redness: no Swelling: no Bruising: no Fevers: no  HIP PAIN- has been staying about the same  KNEE PAIN Duration: couple of months Involved knee: right Mechanism of injury: unknown Location:diffuse Onset: gradual Severity: severe  Quality:  dull and aching Frequency: intermittent Radiation: yes- hip Aggravating factors: moving suddenly   Alleviating factors: nothing  Status: stable Treatments attempted: rest, ice, heat, APAP, ibuprofen and aleve  Relief with NSAIDs?:  mild Weakness with weight bearing or walking: yes Sensation of giving way: no Locking: no Popping: yes Bruising: no Swelling: no Redness: no Paresthesias/decreased sensation: no Fevers: no  Relevant past medical, surgical, family and social history reviewed and updated as indicated. Interim medical history since our last visit  reviewed. Allergies and medications reviewed and updated.  Review of Systems  Constitutional: Negative.   Respiratory: Negative.   Cardiovascular: Negative.   Musculoskeletal: Positive for arthralgias and joint swelling. Negative for back pain, gait problem, myalgias, neck pain and neck stiffness.  Neurological: Negative.   Psychiatric/Behavioral: Negative.     Per HPI unless specifically indicated above     Objective:    BP (!) 144/84 (BP Location: Right Arm, Patient Position: Sitting, Cuff Size: Normal)   Pulse 78   Temp 98 F (36.7 C)   Wt 145 lb 3.2 oz (65.9 kg)   LMP  (LMP Unknown)   SpO2 95%   BMI 29.33 kg/m   Wt Readings from Last 3 Encounters:  08/25/16 145 lb 3.2 oz (65.9 kg)  07/27/16 146 lb (66.2 kg)  06/21/16 146 lb 8 oz (66.5 kg)    Physical Exam  Constitutional: She is oriented to person, place, and time. She appears well-developed and well-nourished. No distress.  HENT:  Head: Normocephalic and atraumatic.  Right Ear: Hearing normal.  Left Ear: Hearing normal.  Nose: Nose normal.  Eyes: Conjunctivae and lids are normal. Right eye exhibits no discharge. Left eye exhibits no discharge. No scleral icterus.  Pulmonary/Chest: Effort normal. No respiratory distress.  Musculoskeletal:  No tenderness along joint line of R knee, mild effusion, Negative Lachman's negative, mcmurrays, negative appleys distraction and compression  Neurological: She is alert and oriented to person, place, and time.  Skin: Skin is warm, dry and intact. No rash noted. She is not diaphoretic. No erythema.  No pallor.  Psychiatric: She has a normal mood and affect. Her speech is normal and behavior is normal. Judgment and thought content normal. Cognition and memory are normal.  Nursing note and vitals reviewed.     Shoulder: right    Inspection:  no swelling, ecchymosis, erythema or step off deformity.     Tenderness to Palpation:    Acromion: no    AC joint:no    Clavicle: no     Bicipital groove: no    Scapular spine: yes    Coracoid process: no    Humeral head: yes    Supraspinatus tendon: yes     Range of Motion:     Abduction:Decreased    Adduction: Decreased    Flexion: Decreased    Extension: Decreased    Internal rotation: Decreased    External rotation: Decreased    Painful arc: yes     Muscle Strength: 5/5 bilaterally     Neuro: Sensation WNL. and Upper extremity reflexes WNL.     Special Tests:     Luan Pulling sign: Positive    Cross arm adduction: Positive    Yergason sign: Negative    O'brien sign: Positive     Speed sign: Positive  Results for orders placed or performed in visit on 05/05/16  Lyme Ab/Western Blot Reflex  Result Value Ref Range   Lyme IgG/IgM Ab <0.91 0.00 - 0.90 ISR   LYME DISEASE AB, QUANT, IGM <0.80 0.00 - 0.79 index  Rocky mtn spotted fvr abs pnl(IgG+IgM)  Result Value Ref Range   RMSF IgG Positive (A) Negative   RMSF IgM 0.27 0.00 - 123456 index  Ehrlichia Antibody Panel  Result Value Ref Range   E.Chaffeensis (HME) IgG Negative Neg:<1:64   E. Chaffeensis (HME) IgM Titer Negative Neg:<1:20   HGE IgG Titer Negative Neg:<1:64   HGE IgM Titer Negative Neg:<1:20  Babesia microti Antibody Panel  Result Value Ref Range   Babesia microti IgM <1:10 Neg:<1:10   Babesia microti IgG <1:10 Neg:<1:10  RMSF, IgG, IFA  Result Value Ref Range   RMSF, IGG, IFA 1:64 (H) Neg <1:64      Assessment & Plan:   Problem List Items Addressed This Visit      Musculoskeletal and Integument   Rotator cuff impingement syndrome of right shoulder - Primary    Still no better. Will get back into orthopedics. Appointment made today. Call with any concerns.        Other Visit Diagnoses    Trochanteric bursitis of right hip       Injection given today. Call with any concerns or if not getting better.    Recurrent pain of right knee       Will see how her knee does after her trochanteric bursa injection. Call with any concerns. Recheck 2  weeks.       Procedure: Right Trochanteric Bursitis Steroid Injection        Diagnosis:   ICD-9-CM ICD-10-CM   1. Rotator cuff impingement syndrome of right shoulder 726.10 M75.41   2. Trochanteric bursitis of right hip 726.5 M70.61    Injection given today. Call with any concerns or if not getting better.   3. Recurrent pain of right knee 719.46 M25.561    Will see how her knee does after her trochanteric bursa injection. Call with any concerns. Recheck 2 weeks.     Physician: MJ Consent:  Risks, benefits, and alternative treatments discussed and all questions were answered.  Patient elected to  proceed and verbal consent obtained.  Description: Area prepped and draped using  semi-sterile technique. A mixture of 40mg  of Kenalog and 4cc of lidocaine 1% was injected into the area of maximal tenderness using a lateral approach.  Complications: none Post Procedure Instructions: Wound care instructions discussed and patient was instructed to keep area clean and dry.  Signs and symptoms of infection discussed, patient agrees to contact the office ASAP should they occur.  Follow Up: Return in about 2 weeks (around 09/08/2016) for follow up knee and hip pain.   Follow up plan: Return in about 2 weeks (around 09/08/2016) for follow up knee and hip pain.

## 2016-08-29 ENCOUNTER — Other Ambulatory Visit: Payer: Self-pay | Admitting: Family Medicine

## 2016-09-12 DIAGNOSIS — M7541 Impingement syndrome of right shoulder: Secondary | ICD-10-CM | POA: Diagnosis not present

## 2016-09-20 ENCOUNTER — Encounter: Payer: Self-pay | Admitting: Family Medicine

## 2016-09-20 ENCOUNTER — Ambulatory Visit (INDEPENDENT_AMBULATORY_CARE_PROVIDER_SITE_OTHER): Payer: Medicare Other | Admitting: Family Medicine

## 2016-09-20 VITALS — BP 124/78 | HR 67 | Temp 98.0°F | Ht 59.0 in | Wt 143.0 lb

## 2016-09-20 DIAGNOSIS — F419 Anxiety disorder, unspecified: Secondary | ICD-10-CM | POA: Diagnosis not present

## 2016-09-20 DIAGNOSIS — E782 Mixed hyperlipidemia: Secondary | ICD-10-CM | POA: Diagnosis not present

## 2016-09-20 DIAGNOSIS — M25561 Pain in right knee: Secondary | ICD-10-CM | POA: Diagnosis not present

## 2016-09-20 DIAGNOSIS — I10 Essential (primary) hypertension: Secondary | ICD-10-CM | POA: Diagnosis not present

## 2016-09-20 MED ORDER — ATORVASTATIN CALCIUM 40 MG PO TABS: 40.0000 mg | ORAL_TABLET | Freq: Every day | ORAL | 3 refills | Status: DC

## 2016-09-20 MED ORDER — AMLODIPINE BESYLATE 10 MG PO TABS: 10.0000 mg | ORAL_TABLET | Freq: Every day | ORAL | 3 refills | Status: DC

## 2016-09-20 MED ORDER — BUSPIRONE HCL 15 MG PO TABS: 15.0000 mg | ORAL_TABLET | Freq: Two times a day (BID) | ORAL | 3 refills | Status: DC

## 2016-09-20 MED ORDER — LOSARTAN POTASSIUM 50 MG PO TABS: 50.0000 mg | ORAL_TABLET | Freq: Every day | ORAL | 3 refills | Status: DC

## 2016-09-20 MED ORDER — SERTRALINE HCL 100 MG PO TABS: 150.0000 mg | ORAL_TABLET | Freq: Every day | ORAL | 1 refills | Status: DC

## 2016-09-20 NOTE — Assessment & Plan Note (Signed)
Not under good control. Will increase zoloft to 150mg  and recheck 1 month. Increase buspar to BID PRN. Call with any concerns.

## 2016-09-20 NOTE — Progress Notes (Signed)
BP 124/78 (BP Location: Left Arm, Patient Position: Sitting, Cuff Size: Normal)   Pulse 67   Temp 98 F (36.7 C)   Ht 4\' 11"  (1.499 m)   Wt 143 lb (64.9 kg)   LMP  (LMP Unknown)   SpO2 96%   BMI 28.88 kg/m    Subjective:    Patient ID: Cynthia Vaughan, female    DOB: [birth year], 76 y.o.   MRN: [id number]  HPI: KRYSTALIN MONCION is a 76 y.o. female  Chief Complaint  Patient presents with  . Hypertension  . Hyperlipidemia   HYPERTENSION / HYPERLIPIDEMIA Satisfied with current treatment? yes Duration of hypertension: chronic BP monitoring frequency: rarely BP range:  BP medication side effects: no Past BP meds: losartan, amlodipine Duration of hyperlipidemia: chronic Cholesterol medication side effects: no Cholesterol supplements: none Past cholesterol medications: atorvastain (lipitor) Medication compliance: excellent compliance Aspirin: yes Recent stressors: yes Recurrent headaches: no Visual changes: no Palpitations: no Dyspnea: no Chest pain: no Lower extremity edema: no Dizzy/lightheaded: no  KNEE PAIN- resolved  ANXIETY/STRESS Duration:exacerbated Anxious mood: yes  Excessive worrying: yes Irritability: no  Sweating: no Nausea: no Palpitations:no Hyperventilation: no Panic attacks: no Agoraphobia: no  Obscessions/compulsions: yes Depressed mood: yes Depression screen [facility] 2/9 06/21/2016 02/22/2016  Decreased Interest 1 0  Down, Depressed, Hopeless 1 1  PHQ - 2 Score 2 1   Anhedonia: no Weight changes: no Insomnia: no   Hypersomnia: no Fatigue/loss of energy: yes Feelings of worthlessness: no Feelings of guilt: no Impaired concentration/indecisiveness: no Suicidal ideations: no  Crying spells: no Recent Stressors/Life Changes: yes   Relationship problems: no   Family stress: yes     Financial stress: yes    Job stress: no    Recent death/loss: no  Relevant past medical, surgical, family and social history reviewed and updated as  indicated. Interim medical history since our last visit reviewed. Allergies and medications reviewed and updated.  Review of Systems  Constitutional: Negative.   Respiratory: Negative.   Cardiovascular: Negative.   Psychiatric/Behavioral: Positive for dysphoric mood. Negative for agitation, behavioral problems, confusion, decreased concentration, hallucinations, self-injury and suicidal ideas. The patient is nervous/anxious. The patient is not hyperactive.     Per HPI unless specifically indicated above     Objective:    BP 124/78 (BP Location: Left Arm, Patient Position: Sitting, Cuff Size: Normal)   Pulse 67   Temp 98 F (36.7 C)   Ht 4\' 11"  (1.499 m)   Wt 143 lb (64.9 kg)   LMP  (LMP Unknown)   SpO2 96%   BMI 28.88 kg/m   Wt Readings from Last 3 Encounters:  09/20/16 143 lb (64.9 kg)  08/25/16 145 lb 3.2 oz (65.9 kg)  07/27/16 146 lb (66.2 kg)    Physical Exam  Constitutional: She is oriented to person, place, and time. She appears well-developed and well-nourished. No distress.  HENT:  Head: Normocephalic and atraumatic.  Right Ear: Hearing normal.  Left Ear: Hearing normal.  Nose: Nose normal.  Eyes: Conjunctivae and lids are normal. Right eye exhibits no discharge. Left eye exhibits no discharge. No scleral icterus.  Cardiovascular: Normal rate, regular rhythm, normal heart sounds and intact distal pulses.  Exam reveals no gallop and no friction rub.   No murmur heard. Pulmonary/Chest: Effort normal and breath sounds normal. No respiratory distress. She has no wheezes. She has no rales. She exhibits no tenderness.  Musculoskeletal: Normal range of motion.  Neurological: She is alert and oriented to  person, place, and time.  Skin: Skin is warm, dry and intact. No rash noted. She is not diaphoretic. No erythema. No pallor.  Psychiatric: She has a normal mood and affect. Her speech is normal and behavior is normal. Judgment and thought content normal. Cognition and  memory are normal.  Nursing note and vitals reviewed.   Results for orders placed or performed in visit on 05/05/16  Lyme Ab/Western Blot Reflex  Result Value Ref Range   Lyme IgG/IgM Ab <0.91 0.00 - 0.90 ISR   LYME DISEASE AB, QUANT, IGM <0.80 0.00 - 0.79 index  Rocky mtn spotted fvr abs pnl(IgG+IgM)  Result Value Ref Range   RMSF IgG Positive (A) Negative   RMSF IgM 0.27 0.00 - 123456 index  Ehrlichia Antibody Panel  Result Value Ref Range   E.Chaffeensis (HME) IgG Negative Neg:<1:64   E. Chaffeensis (HME) IgM Titer Negative Neg:<1:20   HGE IgG Titer Negative Neg:<1:64   HGE IgM Titer Negative Neg:<1:20  Babesia microti Antibody Panel  Result Value Ref Range   Babesia microti IgM <1:10 Neg:<1:10   Babesia microti IgG <1:10 Neg:<1:10  RMSF, IgG, IFA  Result Value Ref Range   RMSF, IGG, IFA 1:64 (H) Neg <1:64      Assessment & Plan:   Problem List Items Addressed This Visit      Cardiovascular and Mediastinum   Hypertension - Primary    Under good control. Continue current regimen. Continue to monitor.      Relevant Medications   atorvastatin (LIPITOR) 40 MG tablet   amLODipine (NORVASC) 10 MG tablet   losartan (COZAAR) 50 MG tablet   Other Relevant Orders   Comprehensive metabolic panel     Other   Anxiety    Not under good control. Will increase zoloft to 150mg  and recheck 1 month. Increase buspar to BID PRN. Call with any concerns.       Relevant Medications   busPIRone (BUSPAR) 15 MG tablet   sertraline (ZOLOFT) 100 MG tablet   Hyperlipidemia    Will check labs today and will continue to monitor. Refills given.       Relevant Medications   atorvastatin (LIPITOR) 40 MG tablet   amLODipine (NORVASC) 10 MG tablet   losartan (COZAAR) 50 MG tablet   Other Relevant Orders   Comprehensive metabolic panel   Lipid Panel w/o Chol/HDL Ratio    Other Visit Diagnoses    Recurrent pain of right knee       Resolved with trochanteric steroid injection.          Follow up plan: Return in about 4 weeks (around 10/18/2016) for Follow up anxiety.

## 2016-09-20 NOTE — Assessment & Plan Note (Signed)
Will check labs today and will continue to monitor. Refills given.

## 2016-09-20 NOTE — Assessment & Plan Note (Signed)
Under good control. Continue current regimen. Continue to monitor.  

## 2016-09-21 ENCOUNTER — Encounter: Payer: Self-pay | Admitting: Family Medicine

## 2016-09-21 LAB — COMPREHENSIVE METABOLIC PANEL
A/G RATIO: 1.6 (ref 1.2–2.2)
ALK PHOS: 77 IU/L (ref 39–117)
ALT: 15 IU/L (ref 0–32)
AST: 14 IU/L (ref 0–40)
Albumin: 4.2 g/dL (ref 3.5–4.8)
BILIRUBIN TOTAL: 0.3 mg/dL (ref 0.0–1.2)
BUN/Creatinine Ratio: 24 (ref 12–28)
BUN: 22 mg/dL (ref 8–27)
CHLORIDE: 102 mmol/L (ref 96–106)
CO2: 24 mmol/L (ref 18–29)
Calcium: 9.4 mg/dL (ref 8.7–10.3)
Creatinine, Ser: 0.93 mg/dL (ref 0.57–1.00)
GFR calc Af Amer: 70 mL/min/{1.73_m2} (ref >59)
GFR calc non Af Amer: 61 mL/min/{1.73_m2} (ref >59)
Globulin, Total: 2.6 g/dL (ref 1.5–4.5)
Glucose: 96 mg/dL (ref 65–99)
POTASSIUM: 4.3 mmol/L (ref 3.5–5.2)
Sodium: 141 mmol/L (ref 134–144)
Total Protein: 6.8 g/dL (ref 6.0–8.5)

## 2016-09-21 LAB — LIPID PANEL W/O CHOL/HDL RATIO
Cholesterol, Total: 251 mg/dL — ABNORMAL HIGH (ref 100–199)
HDL: 70 mg/dL (ref >39)
LDL Calculated: 158 mg/dL — ABNORMAL HIGH (ref 0–99)
TRIGLYCERIDES: 115 mg/dL (ref 0–149)
VLDL Cholesterol Cal: 23 mg/dL (ref 5–40)

## 2016-09-25 ENCOUNTER — Other Ambulatory Visit: Payer: Self-pay | Admitting: Family Medicine

## 2016-09-26 NOTE — Telephone Encounter (Signed)
Last (acute) OV: 08/25/16 Last routine OV: 09/20/16 Next OV: 10/18/16  Medication is on pt's med list, does not appear that it has even been written by someone in this office before. FYI only.

## 2016-10-07 ENCOUNTER — Other Ambulatory Visit: Payer: Self-pay | Admitting: Family Medicine

## 2016-10-18 ENCOUNTER — Ambulatory Visit (INDEPENDENT_AMBULATORY_CARE_PROVIDER_SITE_OTHER): Payer: Medicare Other | Admitting: Family Medicine

## 2016-10-18 ENCOUNTER — Encounter: Payer: Self-pay | Admitting: Family Medicine

## 2016-10-18 VITALS — BP 132/80 | HR 79 | Temp 98.5°F | Resp 17 | Ht 59.0 in | Wt 146.0 lb

## 2016-10-18 DIAGNOSIS — F419 Anxiety disorder, unspecified: Secondary | ICD-10-CM

## 2016-10-18 DIAGNOSIS — K0889 Other specified disorders of teeth and supporting structures: Secondary | ICD-10-CM

## 2016-10-18 MED ORDER — FIRST-DUKES MOUTHWASH MT SUSP: 5.0000 mL | Freq: Three times a day (TID) | OROMUCOSAL | 3 refills | Status: DC | PRN

## 2016-10-18 NOTE — Assessment & Plan Note (Signed)
Better on current regimen. Continue to monitor. Call with any concerns.

## 2016-10-18 NOTE — Progress Notes (Signed)
BP 132/80   Pulse 79   Temp 98.5 F (36.9 C) (Oral)   Resp 17   Ht 4\' 11"  (1.499 m)   Wt 146 lb (66.2 kg)   LMP  (LMP Unknown)   SpO2 95%   BMI 29.49 kg/m    Subjective:    Patient ID: Cynthia Vaughan, female    DOB: Oct 03, 1940, 76 y.o.   MRN: [id number]  HPI: Cynthia Vaughan is a 76 y.o. female  Chief Complaint  Patient presents with  . Anxiety   ANXIETY/STRESS Duration:better Anxious mood: yes  Excessive worrying: yes Irritability: no  Sweating: no Nausea: no Palpitations:no Hyperventilation: no Panic attacks: no Agoraphobia: no  Obscessions/compulsions: no Depressed mood: yes Depression screen [facility] 2/9 06/21/2016 02/22/2016  Decreased Interest 1 0  Down, Depressed, Hopeless 1 1  PHQ - 2 Score 2 1   Anhedonia: no Weight changes: no Insomnia: no   Hypersomnia: no Fatigue/loss of energy: yes Feelings of worthlessness: no Feelings of guilt: no Impaired concentration/indecisiveness: no Suicidal ideations: no  Crying spells: yes Recent Stressors/Life Changes: no   Relationship problems: no   Family stress: yes     Financial stress: no    Job stress: no    Recent death/loss: no  Relevant past medical, surgical, family and social history reviewed and updated as indicated. Interim medical history since our last visit reviewed. Allergies and medications reviewed and updated.  Review of Systems  Constitutional: Negative.   HENT: Positive for dental problem.   Respiratory: Negative.   Cardiovascular: Negative.   Psychiatric/Behavioral: Negative.     Per HPI unless specifically indicated above     Objective:    BP 132/80   Pulse 79   Temp 98.5 F (36.9 C) (Oral)   Resp 17   Ht 4\' 11"  (1.499 m)   Wt 146 lb (66.2 kg)   LMP  (LMP Unknown)   SpO2 95%   BMI 29.49 kg/m   Wt Readings from Last 3 Encounters:  10/18/16 146 lb (66.2 kg)  09/20/16 143 lb (64.9 kg)  08/25/16 145 lb 3.2 oz (65.9 kg)    Physical Exam  Constitutional: She is oriented  to person, place, and time. She appears well-developed and well-nourished. No distress.  HENT:  Head: Normocephalic and atraumatic.  Right Ear: Hearing and external ear normal.  Left Ear: Hearing and external ear normal.  Nose: Nose normal.  Mouth/Throat: Oropharynx is clear and moist. No oropharyngeal exudate.  Gum irritation from partial plate on L anterior portion of her gums  Eyes: Conjunctivae and lids are normal. Right eye exhibits no discharge. Left eye exhibits no discharge. No scleral icterus.  Cardiovascular: Normal rate, regular rhythm, normal heart sounds and intact distal pulses.  Exam reveals no gallop and no friction rub.   No murmur heard. Pulmonary/Chest: Effort normal and breath sounds normal. No respiratory distress. She has no wheezes. She has no rales. She exhibits no tenderness.  Musculoskeletal: Normal range of motion.  Neurological: She is alert and oriented to person, place, and time.  Skin: Skin is warm, dry and intact. No rash noted. She is not diaphoretic. No erythema. No pallor.  Psychiatric: She has a normal mood and affect. Her speech is normal and behavior is normal. Judgment and thought content normal. Cognition and memory are normal.  Nursing note and vitals reviewed.   Results for orders placed or performed in visit on 09/20/16  Comprehensive metabolic panel  Result Value Ref Range   Glucose 96  65 - 99 mg/dL   BUN 22 8 - 27 mg/dL   Creatinine, Ser 0.93 0.57 - 1.00 mg/dL   GFR calc non Af Amer 61 >59 mL/min/1.73   GFR calc Af Amer 70 >59 mL/min/1.73   BUN/Creatinine Ratio 24 12 - 28   Sodium 141 134 - 144 mmol/L   Potassium 4.3 3.5 - 5.2 mmol/L   Chloride 102 96 - 106 mmol/L   CO2 24 18 - 29 mmol/L   Calcium 9.4 8.7 - 10.3 mg/dL   Total Protein 6.8 6.0 - 8.5 g/dL   Albumin 4.2 3.5 - 4.8 g/dL   Globulin, Total 2.6 1.5 - 4.5 g/dL   Albumin/Globulin Ratio 1.6 1.2 - 2.2   Bilirubin Total 0.3 0.0 - 1.2 mg/dL   Alkaline Phosphatase 77 39 - 117 IU/L    AST 14 0 - 40 IU/L   ALT 15 0 - 32 IU/L  Lipid Panel w/o Chol/HDL Ratio  Result Value Ref Range   Cholesterol, Total 251 (H) 100 - 199 mg/dL   Triglycerides 115 0 - 149 mg/dL   HDL 70 >39 mg/dL   VLDL Cholesterol Cal 23 5 - 40 mg/dL   LDL Calculated 158 (H) 0 - 99 mg/dL      Assessment & Plan:   Problem List Items Addressed This Visit      Other   Anxiety - Primary    Better on current regimen. Continue to monitor. Call with any concerns.        Other Visit Diagnoses    Pain, dental       Continue to follow with dentist. Will start her on dukes magic mouthwash for comfort. Call with any concerns.        Follow up plan: Return in about 5 months (around 03/17/2017) for Follow up.

## 2016-11-22 ENCOUNTER — Telehealth: Payer: Self-pay | Admitting: Family Medicine

## 2016-11-22 MED ORDER — BUSPIRONE HCL 15 MG PO TABS: 15.0000 mg | ORAL_TABLET | Freq: Three times a day (TID) | ORAL | 3 refills | Status: DC

## 2016-11-22 MED ORDER — LORATADINE 10 MG PO TABS
10.0000 mg | ORAL_TABLET | Freq: Every day | ORAL | 11 refills | Status: DC
Start: 2016-11-22 — End: 2017-10-12

## 2016-11-22 NOTE — Telephone Encounter (Signed)
Claritin sent to her pharmacy. OK to take the buspar TID. Rx sent to her pharmacy

## 2016-11-22 NOTE — Telephone Encounter (Signed)
Patient would like for you to send a script in for Claritin to [facility]   She also states the buspirone she thought you were going to increase this med but it has not been increased.  Thanks

## 2016-11-23 NOTE — Telephone Encounter (Signed)
Patient informed about prescriptions.

## 2016-11-28 ENCOUNTER — Encounter: Payer: Self-pay | Admitting: Family Medicine

## 2016-11-28 ENCOUNTER — Ambulatory Visit (INDEPENDENT_AMBULATORY_CARE_PROVIDER_SITE_OTHER): Payer: Medicare Other | Admitting: Family Medicine

## 2016-11-28 VITALS — BP 130/83 | HR 82 | Temp 98.5°F | Resp 17 | Ht 59.0 in | Wt 147.0 lb

## 2016-11-28 DIAGNOSIS — J209 Acute bronchitis, unspecified: Secondary | ICD-10-CM | POA: Diagnosis not present

## 2016-11-28 DIAGNOSIS — J440 Chronic obstructive pulmonary disease with acute lower respiratory infection: Secondary | ICD-10-CM | POA: Diagnosis not present

## 2016-11-28 MED ORDER — AZITHROMYCIN 250 MG PO TABS: ORAL_TABLET | ORAL | 0 refills | Status: DC

## 2016-11-28 MED ORDER — ALBUTEROL SULFATE (2.5 MG/3ML) 0.083% IN NEBU
2.5000 mg | INHALATION_SOLUTION | Freq: Once | RESPIRATORY_TRACT | Status: AC
  Administered 2016-11-28: 2.5 mg via RESPIRATORY_TRACT

## 2016-11-28 MED ORDER — PREDNISONE 10 MG PO TABS: ORAL_TABLET | ORAL | 0 refills | Status: DC

## 2016-11-28 MED ORDER — DOXYCYCLINE HYCLATE 100 MG PO TABS
100.0000 mg | ORAL_TABLET | Freq: Two times a day (BID) | ORAL | 0 refills | Status: DC
Start: 2016-11-28 — End: 2016-12-12

## 2016-11-28 NOTE — Progress Notes (Signed)
BP 130/83 (BP Location: Left Arm, Patient Position: Sitting, Cuff Size: Normal)   Pulse 82   Temp 98.5 F (36.9 C) (Oral)   Resp 17   Ht 4\' 11"  (1.499 m)   Wt 147 lb (66.7 kg)   LMP  (LMP Unknown)   SpO2 96%   BMI 29.69 kg/m    Subjective:    Patient ID: Cynthia Vaughan, female    DOB: [birth year], 76 y.o.   MRN: [id number]  HPI: Cynthia Vaughan is a 76 y.o. female  Chief Complaint  Patient presents with  . Cough    onset 2 weeks  . Sore Throat   UPPER RESPIRATORY TRACT INFECTION Duration: 2-3 weeks  Worst symptom: sore throat, coughing Fever: yes Cough: yes Shortness of breath: yes Wheezing: yes Chest pain: no Chest tightness: yes Chest congestion: yes Nasal congestion: yes Runny nose: yes Post nasal drip: yes Sneezing: no Sore throat: yes Swollen glands: no Sinus pressure: no Headache: yes Face pain: yes Toothache: yes Ear pain: no  Ear pressure: no  Eyes red/itching:yes Eye drainage/crusting: no  Vomiting: no Rash: no Fatigue: yes Sick contacts: yes Strep contacts: no  Context: worse Recurrent sinusitis: no Relief with OTC cold/cough medications: no  Treatments attempted: anti-histamine   Relevant past medical, surgical, family and social history reviewed and updated as indicated. Interim medical history since our last visit reviewed. Allergies and medications reviewed and updated.  Review of Systems  Constitutional: Positive for fatigue and fever. Negative for activity change, appetite change, chills, diaphoresis and unexpected weight change.  HENT: Positive for congestion, postnasal drip, rhinorrhea and sore throat. Negative for dental problem, drooling, ear discharge, ear pain, facial swelling, hearing loss, mouth sores, nosebleeds, sinus pain, sinus pressure, sneezing, tinnitus, trouble swallowing and voice change.   Respiratory: Positive for cough, chest tightness, shortness of breath and wheezing. Negative for apnea, choking and stridor.     Cardiovascular: Negative for chest pain, palpitations and leg swelling.  Psychiatric/Behavioral: Negative.     Per HPI unless specifically indicated above     Objective:    BP 130/83 (BP Location: Left Arm, Patient Position: Sitting, Cuff Size: Normal)   Pulse 82   Temp 98.5 F (36.9 C) (Oral)   Resp 17   Ht 4\' 11"  (1.499 m)   Wt 147 lb (66.7 kg)   LMP  (LMP Unknown)   SpO2 96%   BMI 29.69 kg/m   Wt Readings from Last 3 Encounters:  11/28/16 147 lb (66.7 kg)  10/18/16 146 lb (66.2 kg)  09/20/16 143 lb (64.9 kg)    Physical Exam  Constitutional: She is oriented to person, place, and time. She appears well-developed and well-nourished. No distress.  HENT:  Head: Normocephalic and atraumatic.  Right Ear: Hearing and external ear normal.  Left Ear: Hearing and external ear normal.  Nose: Nose normal.  Mouth/Throat: Oropharynx is clear and moist. No oropharyngeal exudate.  Eyes: Conjunctivae, EOM and lids are normal. Pupils are equal, round, and reactive to light. Right eye exhibits no discharge. Left eye exhibits no discharge. No scleral icterus.  Neck: Normal range of motion. Neck supple. No JVD present. No tracheal deviation present. No thyromegaly present.  Cardiovascular: Normal rate, regular rhythm, normal heart sounds and intact distal pulses.  Exam reveals no gallop and no friction rub.   No murmur heard. Pulmonary/Chest: Effort normal. No stridor. No respiratory distress. She has no wheezes. She has rhonchi in the right lower field and the left lower field.  She has no rales. She exhibits no tenderness.  Musculoskeletal: Normal range of motion.  Lymphadenopathy:    She has no cervical adenopathy.  Neurological: She is alert and oriented to person, place, and time.  Skin: Skin is warm, dry and intact. No rash noted. She is not diaphoretic. No erythema. No pallor.  Psychiatric: She has a normal mood and affect. Her speech is normal and behavior is normal. Judgment and  thought content normal. Cognition and memory are normal.  Nursing note and vitals reviewed.   Results for orders placed or performed in visit on 09/20/16  Comprehensive metabolic panel  Result Value Ref Range   Glucose 96 65 - 99 mg/dL   BUN 22 8 - 27 mg/dL   Creatinine, Ser 0.93 0.57 - 1.00 mg/dL   GFR calc non Af Amer 61 >59 mL/min/1.73   GFR calc Af Amer 70 >59 mL/min/1.73   BUN/Creatinine Ratio 24 12 - 28   Sodium 141 134 - 144 mmol/L   Potassium 4.3 3.5 - 5.2 mmol/L   Chloride 102 96 - 106 mmol/L   CO2 24 18 - 29 mmol/L   Calcium 9.4 8.7 - 10.3 mg/dL   Total Protein 6.8 6.0 - 8.5 g/dL   Albumin 4.2 3.5 - 4.8 g/dL   Globulin, Total 2.6 1.5 - 4.5 g/dL   Albumin/Globulin Ratio 1.6 1.2 - 2.2   Bilirubin Total 0.3 0.0 - 1.2 mg/dL   Alkaline Phosphatase 77 39 - 117 IU/L   AST 14 0 - 40 IU/L   ALT 15 0 - 32 IU/L  Lipid Panel w/o Chol/HDL Ratio  Result Value Ref Range   Cholesterol, Total 251 (H) 100 - 199 mg/dL   Triglycerides 115 0 - 149 mg/dL   HDL 70 >39 mg/dL   VLDL Cholesterol Cal 23 5 - 40 mg/dL   LDL Calculated 158 (H) 0 - 99 mg/dL      Assessment & Plan:   Problem List Items Addressed This Visit    None    Visit Diagnoses    Acute bronchitis with COPD ([place])    -  Primary   Better after neb, but persistent rhonchi on the RLL. Will start her on prednisone and z-pack. Recheck in 2 weeks.    Relevant Medications   albuterol (PROVENTIL) (2.5 MG/3ML) 0.083% nebulizer solution 2.5 mg (Completed)   predniSONE (DELTASONE) 10 MG tablet       Follow up plan: Return in about 2 weeks (around 12/12/2016) for lung recheck.

## 2016-11-29 ENCOUNTER — Telehealth: Payer: Self-pay | Admitting: *Deleted

## 2016-11-29 NOTE — Telephone Encounter (Signed)
Rx for Prednisone written to take 6 first day, 5 second day and decrease by one every other day. Rx should say decrease by one every day. Phoned pharmacy and corrected this error. Verified by Dr. Wynetta Emery.

## 2016-12-12 ENCOUNTER — Ambulatory Visit (INDEPENDENT_AMBULATORY_CARE_PROVIDER_SITE_OTHER): Payer: Medicare Other | Admitting: Family Medicine

## 2016-12-12 ENCOUNTER — Encounter: Payer: Self-pay | Admitting: Family Medicine

## 2016-12-12 VITALS — BP 156/91 | HR 87 | Temp 98.3°F | Wt 146.0 lb

## 2016-12-12 DIAGNOSIS — J440 Chronic obstructive pulmonary disease with acute lower respiratory infection: Secondary | ICD-10-CM

## 2016-12-12 DIAGNOSIS — B370 Candidal stomatitis: Secondary | ICD-10-CM

## 2016-12-12 DIAGNOSIS — J209 Acute bronchitis, unspecified: Secondary | ICD-10-CM

## 2016-12-12 MED ORDER — FIRST-DUKES MOUTHWASH MT SUSP: 5.0000 mL | Freq: Three times a day (TID) | OROMUCOSAL | 1 refills | Status: DC | PRN

## 2016-12-12 NOTE — Progress Notes (Signed)
BP (!) 156/91 (BP Location: Left Arm, Patient Position: Sitting, Cuff Size: Normal)   Pulse 87   Temp 98.3 F (36.8 C)   Wt 146 lb (66.2 kg)   LMP  (LMP Unknown)   SpO2 95%   BMI 29.49 kg/m    Subjective:    Patient ID: Cynthia Vaughan, female    DOB: [birth year], 76 y.o.   MRN: [id number]  HPI: Cynthia Vaughan is a 76 y.o. female  Chief Complaint  Patient presents with  . lung recheck   Feeling better. But she notes that she's only 60% better. She's feeling a little nervous and her tongue and throat are feeling funny and feels like it has a fuzz on it. She notes that her breathing is better. She notes that she is otherwise doing well with no other concerns or complaints today.  Relevant past medical, surgical, family and social history reviewed and updated as indicated. Interim medical history since our last visit reviewed. Allergies and medications reviewed and updated.  Review of Systems  Constitutional: Negative.   HENT: Positive for sore throat. Negative for congestion, dental problem, drooling, ear discharge, ear pain, facial swelling, hearing loss, mouth sores, nosebleeds, postnasal drip, rhinorrhea, sinus pain, sinus pressure, sneezing, tinnitus, trouble swallowing and voice change.   Respiratory: Negative.   Cardiovascular: Negative.   Psychiatric/Behavioral: Negative.     Per HPI unless specifically indicated above     Objective:    BP (!) 156/91 (BP Location: Left Arm, Patient Position: Sitting, Cuff Size: Normal)   Pulse 87   Temp 98.3 F (36.8 C)   Wt 146 lb (66.2 kg)   LMP  (LMP Unknown)   SpO2 95%   BMI 29.49 kg/m   Wt Readings from Last 3 Encounters:  12/12/16 146 lb (66.2 kg)  11/28/16 147 lb (66.7 kg)  10/18/16 146 lb (66.2 kg)    Physical Exam  Constitutional: She is oriented to person, place, and time. She appears well-developed and well-nourished. No distress.  HENT:  Head: Normocephalic and atraumatic.  Right Ear: Hearing and external  ear normal.  Left Ear: Hearing and external ear normal.  Nose: Nose normal.  Mouth/Throat: Uvula is midline, oropharynx is clear and moist and mucous membranes are normal. No oropharyngeal exudate.  Thrush on tongue  Eyes: Conjunctivae, EOM and lids are normal. Pupils are equal, round, and reactive to light. Right eye exhibits no discharge. Left eye exhibits no discharge. No scleral icterus.  Neck: Normal range of motion. Neck supple. No JVD present. No tracheal deviation present. No thyromegaly present.  Cardiovascular: Normal rate, regular rhythm, normal heart sounds and intact distal pulses.  Exam reveals no gallop and no friction rub.   No murmur heard. Pulmonary/Chest: Effort normal and breath sounds normal. No stridor. No respiratory distress. She has no wheezes. She has no rales. She exhibits no tenderness.  Musculoskeletal: Normal range of motion.  Lymphadenopathy:    She has no cervical adenopathy.  Neurological: She is alert and oriented to person, place, and time.  Skin: Skin is intact. No rash noted.  Psychiatric: She has a normal mood and affect. Her speech is normal and behavior is normal. Judgment and thought content normal. Cognition and memory are normal.  Nursing note and vitals reviewed.   Results for orders placed or performed in visit on 09/20/16  Comprehensive metabolic panel  Result Value Ref Range   Glucose 96 65 - 99 mg/dL   BUN 22 8 - 27 mg/dL  Creatinine, Ser 0.93 0.57 - 1.00 mg/dL   GFR calc non Af Amer 61 >59 mL/min/1.73   GFR calc Af Amer 70 >59 mL/min/1.73   BUN/Creatinine Ratio 24 12 - 28   Sodium 141 134 - 144 mmol/L   Potassium 4.3 3.5 - 5.2 mmol/L   Chloride 102 96 - 106 mmol/L   CO2 24 18 - 29 mmol/L   Calcium 9.4 8.7 - 10.3 mg/dL   Total Protein 6.8 6.0 - 8.5 g/dL   Albumin 4.2 3.5 - 4.8 g/dL   Globulin, Total 2.6 1.5 - 4.5 g/dL   Albumin/Globulin Ratio 1.6 1.2 - 2.2   Bilirubin Total 0.3 0.0 - 1.2 mg/dL   Alkaline Phosphatase 77 39 - 117  IU/L   AST 14 0 - 40 IU/L   ALT 15 0 - 32 IU/L  Lipid Panel w/o Chol/HDL Ratio  Result Value Ref Range   Cholesterol, Total 251 (H) 100 - 199 mg/dL   Triglycerides 115 0 - 149 mg/dL   HDL 70 >39 mg/dL   VLDL Cholesterol Cal 23 5 - 40 mg/dL   LDL Calculated 158 (H) 0 - 99 mg/dL      Assessment & Plan:   Problem List Items Addressed This Visit    None    Visit Diagnoses    Thrush, oral    -  Primary   Will treat with magic mouth wash. Rx sent to her pharmacy. Call with any concerns or if not getting better.    Relevant Medications   Diphenhyd-Hydrocort-Nystatin (FIRST-DUKES MOUTHWASH) SUSP   Acute bronchitis with COPD ([place])       Resolved. Lungs sound clear.        Follow up plan: Return End of July.

## 2017-01-08 ENCOUNTER — Other Ambulatory Visit: Payer: Self-pay | Admitting: Family Medicine

## 2017-02-19 ENCOUNTER — Other Ambulatory Visit: Payer: Self-pay | Admitting: Family Medicine

## 2017-03-11 DIAGNOSIS — H00015 Hordeolum externum left lower eyelid: Secondary | ICD-10-CM | POA: Diagnosis not present

## 2017-03-15 DIAGNOSIS — Z85828 Personal history of other malignant neoplasm of skin: Secondary | ICD-10-CM | POA: Diagnosis not present

## 2017-03-15 DIAGNOSIS — H00015 Hordeolum externum left lower eyelid: Secondary | ICD-10-CM | POA: Diagnosis not present

## 2017-03-15 DIAGNOSIS — D2239 Melanocytic nevi of other parts of face: Secondary | ICD-10-CM | POA: Diagnosis not present

## 2017-03-15 DIAGNOSIS — L820 Inflamed seborrheic keratosis: Secondary | ICD-10-CM | POA: Diagnosis not present

## 2017-03-15 DIAGNOSIS — L821 Other seborrheic keratosis: Secondary | ICD-10-CM | POA: Diagnosis not present

## 2017-03-15 DIAGNOSIS — D485 Neoplasm of uncertain behavior of skin: Secondary | ICD-10-CM | POA: Diagnosis not present

## 2017-03-15 DIAGNOSIS — Z1283 Encounter for screening for malignant neoplasm of skin: Secondary | ICD-10-CM | POA: Diagnosis not present

## 2017-03-15 DIAGNOSIS — D1800 Hemangioma unspecified site: Secondary | ICD-10-CM | POA: Diagnosis not present

## 2017-03-18 ENCOUNTER — Other Ambulatory Visit: Payer: Self-pay | Admitting: Family Medicine

## 2017-03-22 ENCOUNTER — Other Ambulatory Visit: Payer: Self-pay | Admitting: Family Medicine

## 2017-03-22 NOTE — Telephone Encounter (Signed)
rx

## 2017-03-24 ENCOUNTER — Other Ambulatory Visit: Payer: Self-pay | Admitting: Family Medicine

## 2017-04-20 ENCOUNTER — Ambulatory Visit (INDEPENDENT_AMBULATORY_CARE_PROVIDER_SITE_OTHER): Payer: Medicare Other

## 2017-04-20 ENCOUNTER — Ambulatory Visit: Payer: Medicare Other

## 2017-04-20 VITALS — BP 140/86 | HR 75 | Temp 98.1°F | Resp 16 | Ht 59.0 in | Wt 146.2 lb

## 2017-04-20 DIAGNOSIS — Z0000 Encounter for general adult medical examination without abnormal findings: Secondary | ICD-10-CM | POA: Diagnosis not present

## 2017-04-20 NOTE — Patient Instructions (Signed)
Cynthia Vaughan , Thank you for taking time to come for your Medicare Wellness Visit. I appreciate your ongoing commitment to your health goals. Please review the following plan we discussed and let me know if I can assist you in the future.   Screening recommendations/referrals: Colonoscopy: Completed 08/22/2005, no longer required Mammogram: no longer required Bone Density: completed 04/25/2014 Recommended yearly ophthalmology/optometry visit for glaucoma screening and checkup Recommended yearly dental visit for hygiene and checkup  Vaccinations: Influenza vaccine: up to date, due 04/2017 Pneumococcal vaccine: up to date  Tdap vaccine: due now, check with your insurance company for coverage Shingles vaccine: due now, check with your insurance company for coverage   Advanced directives: Advance directive discussed with you today. You declined this today   Conditions/risks identified: Recommend drinking at least 3-4 glasses of water a day   Next appointment: follow up on 05/08/2017 at 1:00pm with Dr.Johnson. Follow up in one year for your annual wellness exam.    Preventive Care 65 Years and Older, Female Preventive care refers to lifestyle choices and visits with your health care provider that can promote health and wellness. What does preventive care include?  A yearly physical exam. This is also called an annual well check.  Dental exams once or twice a year.  Routine eye exams. Ask your health care provider how often you should have your eyes checked.  Personal lifestyle choices, including:  Daily care of your teeth and gums.  Regular physical activity.  Eating a healthy diet.  Avoiding tobacco and drug use.  Limiting alcohol use.  Practicing safe sex.  Taking low-dose aspirin every day.  Taking vitamin and mineral supplements as recommended by your health care provider. What happens during an annual well check? The services and screenings done by your health care  provider during your annual well check will depend on your age, overall health, lifestyle risk factors, and family history of disease. Counseling  Your health care provider may ask you questions about your:  Alcohol use.  Tobacco use.  Drug use.  Emotional well-being.  Home and relationship well-being.  Sexual activity.  Eating habits.  History of falls.  Memory and ability to understand (cognition).  Work and work Statistician.  Reproductive health. Screening  You may have the following tests or measurements:  Height, weight, and BMI.  Blood pressure.  Lipid and cholesterol levels. These may be checked every 5 years, or more frequently if you are over 75 years old.  Skin check.  Lung cancer screening. You may have this screening every year starting at age 43 if you have a 30-pack-year history of smoking and currently smoke or have quit within the past 15 years.  Fecal occult blood test (FOBT) of the stool. You may have this test every year starting at age 75.  Flexible sigmoidoscopy or colonoscopy. You may have a sigmoidoscopy every 5 years or a colonoscopy every 10 years starting at age 51.  Hepatitis C blood test.  Hepatitis B blood test.  Sexually transmitted disease (STD) testing.  Diabetes screening. This is done by checking your blood sugar (glucose) after you have not eaten for a while (fasting). You may have this done every 1-3 years.  Bone density scan. This is done to screen for osteoporosis. You may have this done starting at age 83.  Mammogram. This may be done every 1-2 years. Talk to your health care provider about how often you should have regular mammograms. Talk with your health care provider about your  test results, treatment options, and if necessary, the need for more tests. Vaccines  Your health care provider may recommend certain vaccines, such as:  Influenza vaccine. This is recommended every year.  Tetanus, diphtheria, and acellular  pertussis (Tdap, Td) vaccine. You may need a Td booster every 10 years.  Zoster vaccine. You may need this after age 27.  Pneumococcal 13-valent conjugate (PCV13) vaccine. One dose is recommended after age 37.  Pneumococcal polysaccharide (PPSV23) vaccine. One dose is recommended after age 23. Talk to your health care provider about which screenings and vaccines you need and how often you need them. This information is not intended to replace advice given to you by your health care provider. Make sure you discuss any questions you have with your health care provider. Document Released: 09/04/2015 Document Revised: 04/27/2016 Document Reviewed: 06/09/2015 Elsevier Interactive Patient Education  2017 [place] Prevention in the Home Falls can cause injuries. They can happen to people of all ages. There are many things you can do to make your home safe and to help prevent falls. What can I do on the outside of my home?  Regularly fix the edges of walkways and driveways and fix any cracks.  Remove anything that might make you trip as you walk through a door, such as a raised step or threshold.  Trim any bushes or trees on the path to your home.  Use bright outdoor lighting.  Clear any walking paths of anything that might make someone trip, such as rocks or tools.  Regularly check to see if handrails are loose or broken. Make sure that both sides of any steps have handrails.  Any raised decks and porches should have guardrails on the edges.  Have any leaves, snow, or ice cleared regularly.  Use sand or salt on walking paths during winter.  Clean up any spills in your garage right away. This includes oil or grease spills. What can I do in the bathroom?  Use night lights.  Install grab bars by the toilet and in the tub and shower. Do not use towel bars as grab bars.  Use non-skid mats or decals in the tub or shower.  If you need to sit down in the shower, use a plastic,  non-slip stool.  Keep the floor dry. Clean up any water that spills on the floor as soon as it happens.  Remove soap buildup in the tub or shower regularly.  Attach bath mats securely with double-sided non-slip rug tape.  Do not have throw rugs and other things on the floor that can make you trip. What can I do in the bedroom?  Use night lights.  Make sure that you have a light by your bed that is easy to reach.  Do not use any sheets or blankets that are too big for your bed. They should not hang down onto the floor.  Have a firm chair that has side arms. You can use this for support while you get dressed.  Do not have throw rugs and other things on the floor that can make you trip. What can I do in the kitchen?  Clean up any spills right away.  Avoid walking on wet floors.  Keep items that you use a lot in easy-to-reach places.  If you need to reach something above you, use a strong step stool that has a grab bar.  Keep electrical cords out of the way.  Do not use floor polish or wax that  makes floors slippery. If you must use wax, use non-skid floor wax.  Do not have throw rugs and other things on the floor that can make you trip. What can I do with my stairs?  Do not leave any items on the stairs.  Make sure that there are handrails on both sides of the stairs and use them. Fix handrails that are broken or loose. Make sure that handrails are as long as the stairways.  Check any carpeting to make sure that it is firmly attached to the stairs. Fix any carpet that is loose or worn.  Avoid having throw rugs at the top or bottom of the stairs. If you do have throw rugs, attach them to the floor with carpet tape.  Make sure that you have a light switch at the top of the stairs and the bottom of the stairs. If you do not have them, ask someone to add them for you. What else can I do to help prevent falls?  Wear shoes that:  Do not have high heels.  Have rubber  bottoms.  Are comfortable and fit you well.  Are closed at the toe. Do not wear sandals.  If you use a stepladder:  Make sure that it is fully opened. Do not climb a closed stepladder.  Make sure that both sides of the stepladder are locked into place.  Ask someone to hold it for you, if possible.  Clearly mark and make sure that you can see:  Any grab bars or handrails.  First and last steps.  Where the edge of each step is.  Use tools that help you move around (mobility aids) if they are needed. These include:  Canes.  Walkers.  Scooters.  Crutches.  Turn on the lights when you go into a dark area. Replace any light bulbs as soon as they burn out.  Set up your furniture so you have a clear path. Avoid moving your furniture around.  If any of your floors are uneven, fix them.  If there are any pets around you, be aware of where they are.  Review your medicines with your doctor. Some medicines can make you feel dizzy. This can increase your chance of falling. Ask your doctor what other things that you can do to help prevent falls. This information is not intended to replace advice given to you by your health care provider. Make sure you discuss any questions you have with your health care provider. Document Released: 06/04/2009 Document Revised: 01/14/2016 Document Reviewed: 09/12/2014 Elsevier Interactive Patient Education  2017 [employer].

## 2017-04-20 NOTE — Progress Notes (Addendum)
Subjective:   Cynthia Vaughan is a 76 y.o. female who presents for Medicare Annual (Subsequent) preventive examination.  Review of Systems:  Cardiac Risk Factors include: advanced age (>80men, >63 women);dyslipidemia;hypertension     Objective:     Vitals: BP 140/86 (BP Location: Left Arm, Patient Position: Sitting)   Pulse 75   Temp 98.1 F (36.7 C)   Resp 16   Ht 4\' 11"  (1.499 m)   Wt 146 lb 3.2 oz (66.3 kg)   LMP  (LMP Unknown)   BMI 29.53 kg/m   Body mass index is 29.53 kg/m.   Tobacco History  Smoking Status  . Former Smoker  Smokeless Tobacco  . Never Used    Comment: quit 20 years ago      Counseling given: Not Answered   Past Medical History:  Diagnosis Date  . Allergy   . Anxiety   . Cancer ([place])    SKIN  . CKD (chronic kidney disease) stage 2, GFR 60-89 ml/min   . Depression   . Edema    LEGS/FEET  . GERD (gastroesophageal reflux disease)   . Heart murmur   . Hyperlipidemia   . Hypertension   . Hypopotassemia   . Microalbuminuria   . Osteoarthritis   . Osteopenia   . [facility] spotted fever) 05/10/2016  . Urinary incontinence   . Vitamin D deficiency disease   . Wheezing    Past Surgical History:  Procedure Laterality Date  . ABDOMINAL HYSTERECTOMY  2004   Total (due to bladder surgery)  . BLADDER SURGERY     x 2  . BUNIONECTOMY    . CATARACT EXTRACTION W/PHACO Right 11/23/2015   Procedure: CATARACT EXTRACTION PHACO AND INTRAOCULAR LENS PLACEMENT (IOC);  Surgeon: Estill Cotta, MD;  Location: [facility] ORS;  Service: Ophthalmology;  Laterality: Right;  [place]    1:30.9 AP%  26.9 CDE   42.29 flud casette lot # [id number]  exp05/31/2018  . COLONOSCOPY     Family History  Problem Relation Age of Onset  . AAA (abdominal aortic aneurysm) Mother   . Arthritis Brother   . Heart disease Brother   . Stroke Daughter   . Diabetes Son   . Stroke Son   . Seizures Son   . Diabetes Brother   . Heart disease Brother        7 total  heart attacks  . Hyperlipidemia Brother   . Hypertension Brother   . Dementia Brother   . AAA (abdominal aortic aneurysm) Brother   . Stroke Son   . Heart disease Brother   . Dementia Sister    History  Sexual Activity  . Sexual activity: Not on file    Outpatient Encounter Prescriptions as of 04/20/2017  Medication Sig  . amLODipine (NORVASC) 10 MG tablet Take 1 tablet (10 mg total) by mouth daily.  [name] aspirin EC 81 MG tablet Take 81 mg by mouth daily.  [name] atorvastatin (LIPITOR) 40 MG tablet Take 1 tablet (40 mg total) by mouth at bedtime.  . busPIRone (BUSPAR) 15 MG tablet Take 1 tablet (15 mg total) by mouth 3 (three) times daily.  . Cholecalciferol (VITAMIN D-3 PO) Take by mouth daily.  . fluticasone (FLONASE) 50 MCG/ACT nasal spray Place 2 sprays into both nostrils daily.  [name] loratadine (CLARITIN) 10 MG tablet Take 1 tablet (10 mg total) by mouth daily.  [name] losartan (COZAAR) 50 MG tablet Take 1 tablet (50 mg total) by mouth daily.  . meloxicam (  MOBIC) 15 MG tablet TK 1 T PO D WITH MEALS.  . naproxen (NAPROSYN) 500 MG tablet TAKE 1 TABLET(500 MG) BY MOUTH TWICE DAILY WITH A MEAL  . omeprazole (PRILOSEC) 20 MG capsule TAKE 1 CAPSULE BY MOUTH DAILY  . ranitidine (ZANTAC) 150 MG tablet TAKE 2 TABLETS BY MOUTH EVERY EVENING  . sertraline (ZOLOFT) 100 MG tablet TAKE 1 AND 1/2 TABLETS(150 MG) BY MOUTH DAILY  . [DISCONTINUED] omeprazole (PRILOSEC) 20 MG capsule TK ONE C PO D  . PROAIR HFA 108 (90 Base) MCG/ACT inhaler INHALE 2 PUFFS INTO THE LUNGS EVERY 6 HOURS AS NEEDED FOR WHEEZING OR SHORTNESS OF BREATH (Patient not taking: Reported on 04/20/2017)  . [DISCONTINUED] Diphenhyd-Hydrocort-Nystatin (FIRST-DUKES MOUTHWASH) SUSP Use as directed 5 mLs in the mouth or throat 3 (three) times daily as needed. (Patient not taking: Reported on 04/20/2017)   No facility-administered encounter medications on file as of 04/20/2017.     Activities of Daily Living In your present state of health, do you  have any difficulty performing the following activities: 04/20/2017  Hearing? N  Vision? N  Difficulty concentrating or making decisions? Y  Walking or climbing stairs? Y  Dressing or bathing? N  Doing errands, shopping? N  Preparing Food and eating ? N  Using the Toilet? N  In the past six months, have you accidently leaked urine? N  Comment wears pad at night   Do you have problems with loss of bowel control? N  Managing your Medications? N  Managing your Finances? N  Housekeeping or managing your Housekeeping? N  Some recent data might be hidden    Patient Care Team: [name], DO as PCP - General (Family Medicine)    Assessment:     Exercise Activities and Dietary recommendations Current Exercise Habits: The patient does not participate in regular exercise at present, Exercise limited by: None identified  Goals    . Increase water intake           Recommend drinking at least 3-4 glasses of water a day       Fall Risk Fall Risk  04/20/2017 06/21/2016 02/22/2016 11/04/2015 07/07/2015  Falls in the past year? No No No No No   Depression Screen PHQ 2/9 Scores 04/20/2017 06/21/2016 02/22/2016  PHQ - 2 Score 0 2 1     Cognitive Function     6CIT Screen 04/20/2017  What Year? 0 points  What month? 0 points  What time? 0 points  Count back from 20 0 points  Months in reverse 0 points  Repeat phrase 0 points  Total Score 0    Immunization History  Administered Date(s) Administered  . Influenza,inj,Quad PF,6+ Mos 05/21/2015  . Influenza-Unspecified 06/11/2014, 05/21/2015, 07/04/2016  . Pneumococcal Conjugate-13 07/07/2015  . Pneumococcal Polysaccharide-23 02/15/2006, 11/04/2010  . Td 02/15/2006  . Zoster 02/05/2008   Screening Tests Health Maintenance  Topic Date Due  . INFLUENZA VACCINE  03/22/2017  . COLONOSCOPY  04/22/2018 (Originally 08/23/2015)  . TETANUS/TDAP  04/22/2018 (Originally 02/16/2016)  . DEXA SCAN  Addressed  . PNA vac Low Risk Adult   Completed      Plan:     I have personally reviewed and addressed the Medicare Annual Wellness questionnaire and have noted the following in the patient's chart:  A. Medical and social history B. Use of alcohol, tobacco or illicit drugs  C. Current medications and supplements D. Functional ability and status E.  Nutritional status F.  Physical activity G. Advance directives  H. List of other physicians I.  Hospitalizations, surgeries, and ER visits in previous 12 months J.  [place] such as hearing and vision if needed, cognitive and depression L. Referrals and appointments - none  In addition, I have reviewed and discussed with patient certain preventive protocols, quality metrics, and best practice recommendations. A written personalized care plan for preventive services as well as general preventive health recommendations were provided to patient.   Signed,  Tyler Aas, LPN Nurse Health Advisor   MD Recommendations:none

## 2017-05-08 ENCOUNTER — Ambulatory Visit (INDEPENDENT_AMBULATORY_CARE_PROVIDER_SITE_OTHER): Payer: Medicare Other | Admitting: Family Medicine

## 2017-05-08 ENCOUNTER — Encounter: Payer: Self-pay | Admitting: Family Medicine

## 2017-05-08 VITALS — BP 124/80 | HR 85 | Wt 146.4 lb

## 2017-05-08 DIAGNOSIS — F331 Major depressive disorder, recurrent, moderate: Secondary | ICD-10-CM

## 2017-05-08 DIAGNOSIS — E559 Vitamin D deficiency, unspecified: Secondary | ICD-10-CM | POA: Diagnosis not present

## 2017-05-08 DIAGNOSIS — F419 Anxiety disorder, unspecified: Secondary | ICD-10-CM | POA: Diagnosis not present

## 2017-05-08 DIAGNOSIS — R809 Proteinuria, unspecified: Secondary | ICD-10-CM | POA: Diagnosis not present

## 2017-05-08 DIAGNOSIS — I129 Hypertensive chronic kidney disease with stage 1 through stage 4 chronic kidney disease, or unspecified chronic kidney disease: Secondary | ICD-10-CM | POA: Diagnosis not present

## 2017-05-08 DIAGNOSIS — E782 Mixed hyperlipidemia: Secondary | ICD-10-CM | POA: Diagnosis not present

## 2017-05-08 DIAGNOSIS — N182 Chronic kidney disease, stage 2 (mild): Secondary | ICD-10-CM

## 2017-05-08 DIAGNOSIS — R32 Unspecified urinary incontinence: Secondary | ICD-10-CM | POA: Diagnosis not present

## 2017-05-08 LAB — UA/M W/RFLX CULTURE, ROUTINE
BILIRUBIN UA: NEGATIVE
Glucose, UA: NEGATIVE
Ketones, UA: NEGATIVE
NITRITE UA: NEGATIVE
PH UA: 6 (ref 5.0–7.5)
Specific Gravity, UA: 1.01 (ref 1.005–1.030)
Urobilinogen, Ur: 0.2 mg/dL (ref 0.2–1.0)

## 2017-05-08 LAB — MICROALBUMIN, URINE WAIVED
Creatinine, Urine Waived: 50 mg/dL (ref 10–300)
MICROALB, UR WAIVED: 80 mg/L — AB (ref 0–19)

## 2017-05-08 LAB — MICROSCOPIC EXAMINATION: Epithelial Cells (non renal): >10 /hpf — AB (ref 0–10)

## 2017-05-08 MED ORDER — NAPROXEN 500 MG PO TABS: ORAL_TABLET | ORAL | 1 refills | Status: DC

## 2017-05-08 MED ORDER — SERTRALINE HCL 100 MG PO TABS: 200.0000 mg | ORAL_TABLET | Freq: Every day | ORAL | 1 refills | Status: DC

## 2017-05-08 MED ORDER — OMEPRAZOLE 20 MG PO CPDR: 20.0000 mg | DELAYED_RELEASE_CAPSULE | Freq: Every day | ORAL | 1 refills | Status: DC

## 2017-05-08 NOTE — Assessment & Plan Note (Signed)
Still not under great control. Will increase her sertaline to 200mg  daily and recheck in 1 month.

## 2017-05-08 NOTE — Assessment & Plan Note (Signed)
Rechecking urine today. Await results. Continue to monitor.

## 2017-05-08 NOTE — Assessment & Plan Note (Signed)
Stable. Continue current regimen. Continue to monitor. Call with any concerns.  

## 2017-05-08 NOTE — Assessment & Plan Note (Signed)
Better on recheck. Continue current regimen. Continue to monitor. Call with any concerns.  

## 2017-05-08 NOTE — Progress Notes (Signed)
BP 124/80   Pulse 85   Wt 146 lb 6 oz (66.4 kg)   LMP  (LMP Unknown)   SpO2 95%   BMI 29.56 kg/m    Subjective:    Patient ID: Cynthia Vaughan, female    DOB: August 17, 1941, 76 y.o.   MRN: [id number]  HPI: Cynthia Vaughan is a 76 y.o. female  Chief Complaint  Patient presents with  . Hypertension  . Hyperlipidemia  . Depression   HYPERTENSION / HYPERLIPIDEMIA Satisfied with current treatment? yes Duration of hypertension: chronic BP monitoring frequency: not checking BP range:  BP medication side effects: no Past BP meds:  Duration of hyperlipidemia: chronic Cholesterol medication side effects: no Cholesterol supplements: none Past cholesterol medications: atrovastatin Medication compliance: excellent compliance Aspirin: yes Recent stressors: yes Recurrent headaches: no Visual changes: no Palpitations: no Dyspnea: no Chest pain: no Lower extremity edema: no Dizzy/lightheaded: no  ANXIETY/DEPRESSION Duration:stable Anxious mood: yes  Excessive worrying: yes Irritability: no  Sweating: no Nausea: no Palpitations:no Hyperventilation: no Panic attacks: no Agoraphobia: no  Obscessions/compulsions: no Depressed mood: yes Depression screen [name] 2/9 05/08/2017 04/20/2017 06/21/2016 02/22/2016  Decreased Interest 2 0 1 0  Down, Depressed, Hopeless 1 0 1 1  PHQ - 2 Score 3 0 2 1  Altered sleeping 1 - - -  Tired, decreased energy 1 - - -  Change in appetite 1 - - -  Feeling bad or failure about yourself  0 - - -  Trouble concentrating 0 - - -  Moving slowly or fidgety/restless 0 - - -  Suicidal thoughts 0 - - -  PHQ-9 Score 6 - - -   GAD 7 : Generalized Anxiety Score 10/18/2016 06/21/2016  Nervous, Anxious, on Edge 3 2  Control/stop worrying 3 2  Worry too much - different things 3 2  Trouble relaxing 2 1  Restless 0 2  Easily annoyed or irritable 1 1  Afraid - awful might happen 0 1  Total GAD 7 Score 12 11  Anxiety Difficulty Somewhat difficult Somewhat  difficult   Anhedonia: no Weight changes: no Insomnia: no   Hypersomnia: no Fatigue/loss of energy: yes Feelings of worthlessness: yes Feelings of guilt: no Impaired concentration/indecisiveness: no Suicidal ideations: no  Crying spells: no Recent Stressors/Life Changes: yes   Relationship problems: no   Family stress: yes     Financial stress: no    Job stress: no    Recent death/loss: no   Relevant past medical, surgical, family and social history reviewed and updated as indicated. Interim medical history since our last visit reviewed. Allergies and medications reviewed and updated.  Review of Systems  Constitutional: Negative.   Respiratory: Negative.   Cardiovascular: Negative.   Gastrointestinal: Negative.   Genitourinary: Negative.   Psychiatric/Behavioral: Negative.     Per HPI unless specifically indicated above     Objective:    BP 124/80   Pulse 85   Wt 146 lb 6 oz (66.4 kg)   LMP  (LMP Unknown)   SpO2 95%   BMI 29.56 kg/m   Wt Readings from Last 3 Encounters:  05/08/17 146 lb 6 oz (66.4 kg)  04/20/17 146 lb 3.2 oz (66.3 kg)  12/12/16 146 lb (66.2 kg)    Physical Exam  Constitutional: She is oriented to person, place, and time. She appears well-developed and well-nourished. No distress.  HENT:  Head: Normocephalic and atraumatic.  Right Ear: Hearing normal.  Left Ear: Hearing normal.  Nose: Nose normal.  Eyes: Conjunctivae and lids are normal. Right eye exhibits no discharge. Left eye exhibits no discharge. No scleral icterus.  Cardiovascular: Normal rate, regular rhythm, normal heart sounds and intact distal pulses.  Exam reveals no gallop and no friction rub.   No murmur heard. Pulmonary/Chest: Effort normal and breath sounds normal. No respiratory distress. She has no wheezes. She has no rales. She exhibits no tenderness.  Musculoskeletal: Normal range of motion.  Neurological: She is alert and oriented to person, place, and time.  Skin: Skin  is warm, dry and intact. No rash noted. She is not diaphoretic. No erythema. No pallor.  Psychiatric: She has a normal mood and affect. Her speech is normal and behavior is normal. Judgment and thought content normal. Cognition and memory are normal.  Nursing note and vitals reviewed.   Results for orders placed or performed in visit on 09/20/16  Comprehensive metabolic panel  Result Value Ref Range   Glucose 96 65 - 99 mg/dL   BUN 22 8 - 27 mg/dL   Creatinine, Ser 0.93 0.57 - 1.00 mg/dL   GFR calc non Af Amer 61 >59 mL/min/1.73   GFR calc Af Amer 70 >59 mL/min/1.73   BUN/Creatinine Ratio 24 12 - 28   Sodium 141 134 - 144 mmol/L   Potassium 4.3 3.5 - 5.2 mmol/L   Chloride 102 96 - 106 mmol/L   CO2 24 18 - 29 mmol/L   Calcium 9.4 8.7 - 10.3 mg/dL   Total Protein 6.8 6.0 - 8.5 g/dL   Albumin 4.2 3.5 - 4.8 g/dL   Globulin, Total 2.6 1.5 - 4.5 g/dL   Albumin/Globulin Ratio 1.6 1.2 - 2.2   Bilirubin Total 0.3 0.0 - 1.2 mg/dL   Alkaline Phosphatase 77 39 - 117 IU/L   AST 14 0 - 40 IU/L   ALT 15 0 - 32 IU/L  Lipid Panel w/o Chol/HDL Ratio  Result Value Ref Range   Cholesterol, Total 251 (H) 100 - 199 mg/dL   Triglycerides 115 0 - 149 mg/dL   HDL 70 >39 mg/dL   VLDL Cholesterol Cal 23 5 - 40 mg/dL   LDL Calculated 158 (H) 0 - 99 mg/dL      Assessment & Plan:   Problem List Items Addressed This Visit      Genitourinary   Benign hypertensive renal disease - Primary    Better on recheck. Continue current regimen. Continue to monitor. Call with any concerns.      Relevant Orders   CBC with Differential/Platelet   Comprehensive metabolic panel   Microalbumin, Urine Waived   TSH   CKD (chronic kidney disease) stage 2, GFR 60-89 ml/min    Stable. Continue current regimen. Continue to monitor. Call with any concerns.       Relevant Orders   CBC with Differential/Platelet   Comprehensive metabolic panel   Microalbumin, Urine Waived   TSH     Other   Anxiety    Still not  under great control. Will increase her sertaline to 200mg  daily and recheck in 1 month.       Relevant Medications   sertraline (ZOLOFT) 100 MG tablet   Other Relevant Orders   Comprehensive metabolic panel   TSH   Depression    Still not under great control. Will increase her sertaline to 200mg  daily and recheck in 1 month.       Relevant Medications   sertraline (ZOLOFT) 100 MG tablet   Other Relevant Orders   CBC  with Differential/Platelet   Comprehensive metabolic panel   TSH   Hyperlipidemia    Stable. Continue current regimen. Continue to monitor. Call with any concerns.       Relevant Orders   Comprehensive metabolic panel   Lipid Panel w/o Chol/HDL Ratio   TSH   Urinary incontinence    Rechecking urine today. Await results. Continue to monitor.       Relevant Orders   Comprehensive metabolic panel   TSH   UA/M w/rflx Culture, Routine   Microalbuminuria    Stable. Continue current regimen. Continue to monitor. Call with any concerns.       Relevant Orders   Comprehensive metabolic panel   Microalbumin, Urine Waived   TSH       Follow up plan: Return in about 4 weeks (around 06/05/2017) for Follow up mood.

## 2017-05-09 ENCOUNTER — Telehealth: Payer: Self-pay | Admitting: Family Medicine

## 2017-05-09 ENCOUNTER — Encounter: Payer: Self-pay | Admitting: Family Medicine

## 2017-05-09 LAB — CBC WITH DIFFERENTIAL/PLATELET
BASOS ABS: 0 10*3/uL (ref 0.0–0.2)
Basos: 0 %
EOS (ABSOLUTE): 0.2 10*3/uL (ref 0.0–0.4)
Eos: 3 %
HEMATOCRIT: 43.4 % (ref 34.0–46.6)
HEMOGLOBIN: 14.4 g/dL (ref 11.1–15.9)
Immature Grans (Abs): 0 10*3/uL (ref 0.0–0.1)
Immature Granulocytes: 0 %
LYMPHS ABS: 2.2 10*3/uL (ref 0.7–3.1)
Lymphs: 31 %
MCH: 30.6 pg (ref 26.6–33.0)
MCHC: 33.2 g/dL (ref 31.5–35.7)
MCV: 92 fL (ref 79–97)
MONOCYTES: 9 %
Monocytes Absolute: 0.6 10*3/uL (ref 0.1–0.9)
NEUTROS ABS: 3.9 10*3/uL (ref 1.4–7.0)
Neutrophils: 57 %
Platelets: 205 10*3/uL (ref 150–379)
RBC: 4.7 x10E6/uL (ref 3.77–5.28)
RDW: 13.7 % (ref 12.3–15.4)
WBC: 6.9 10*3/uL (ref 3.4–10.8)

## 2017-05-09 LAB — COMPREHENSIVE METABOLIC PANEL
A/G RATIO: 1.6 (ref 1.2–2.2)
ALBUMIN: 4.4 g/dL (ref 3.5–4.8)
ALK PHOS: 83 IU/L (ref 39–117)
ALT: 16 IU/L (ref 0–32)
AST: 21 IU/L (ref 0–40)
BILIRUBIN TOTAL: 0.3 mg/dL (ref 0.0–1.2)
BUN / CREAT RATIO: 12 (ref 12–28)
BUN: 11 mg/dL (ref 8–27)
CHLORIDE: 103 mmol/L (ref 96–106)
CO2: 21 mmol/L (ref 20–29)
Calcium: 9.6 mg/dL (ref 8.7–10.3)
Creatinine, Ser: 0.89 mg/dL (ref 0.57–1.00)
GFR calc Af Amer: 73 mL/min/{1.73_m2} (ref >59)
GFR calc non Af Amer: 64 mL/min/{1.73_m2} (ref >59)
GLOBULIN, TOTAL: 2.8 g/dL (ref 1.5–4.5)
GLUCOSE: 117 mg/dL — AB (ref 65–99)
POTASSIUM: 3.8 mmol/L (ref 3.5–5.2)
SODIUM: 141 mmol/L (ref 134–144)
Total Protein: 7.2 g/dL (ref 6.0–8.5)

## 2017-05-09 LAB — LIPID PANEL W/O CHOL/HDL RATIO
CHOLESTEROL TOTAL: 253 mg/dL — AB (ref 100–199)
HDL: 60 mg/dL (ref >39)
LDL Calculated: 152 mg/dL — ABNORMAL HIGH (ref 0–99)
Triglycerides: 206 mg/dL — ABNORMAL HIGH (ref 0–149)
VLDL Cholesterol Cal: 41 mg/dL — ABNORMAL HIGH (ref 5–40)

## 2017-05-09 LAB — TSH: TSH: 3.5 u[IU]/mL (ref 0.450–4.500)

## 2017-05-09 NOTE — Telephone Encounter (Signed)
Called Patient to discuss Community Resource Referral. Left call back # [phone] Jill Alexanders

## 2017-05-11 ENCOUNTER — Telehealth: Payer: Self-pay | Admitting: Family Medicine

## 2017-05-11 NOTE — Telephone Encounter (Signed)
Called and spoke to [place]. She needs to speak to [facility] regarding respite care. Informed her that she was no longer in the office today. Will forward this message to her.

## 2017-05-11 NOTE — Telephone Encounter (Signed)
Patient called asking to speak with Tiffany the [place] but I informed her she was not in today but someone else could help her. She then requested to have Dr Wynetta Emery call her. I asked for information to give Dr Wynetta Emery but she did not give me anything.  Thank You  [phone]

## 2017-05-11 NOTE — Telephone Encounter (Signed)
Routing to provider  

## 2017-05-12 NOTE — Telephone Encounter (Signed)
Called patient back regarding respite care. Informed patient Cynthia Vaughan, Care Guide has tried contacting her in regards to to the referral I sent in for this. Phone number given to patient to call back regarding respite care for her duaghter.

## 2017-06-05 ENCOUNTER — Encounter: Payer: Self-pay | Admitting: Family Medicine

## 2017-06-05 ENCOUNTER — Ambulatory Visit (INDEPENDENT_AMBULATORY_CARE_PROVIDER_SITE_OTHER): Payer: Medicare Other | Admitting: Family Medicine

## 2017-06-05 VITALS — BP 119/83 | HR 73 | Temp 97.8°F

## 2017-06-05 DIAGNOSIS — J301 Allergic rhinitis due to pollen: Secondary | ICD-10-CM

## 2017-06-05 DIAGNOSIS — F331 Major depressive disorder, recurrent, moderate: Secondary | ICD-10-CM | POA: Diagnosis not present

## 2017-06-05 MED ORDER — ALBUTEROL SULFATE HFA 108 (90 BASE) MCG/ACT IN AERS: INHALATION_SPRAY | RESPIRATORY_TRACT | 0 refills | Status: DC

## 2017-06-05 MED ORDER — FLUTICASONE PROPIONATE 50 MCG/ACT NA SUSP: 2.0000 | Freq: Every day | NASAL | 11 refills | Status: DC

## 2017-06-05 NOTE — Assessment & Plan Note (Signed)
Slightly exacerbated due to current issues. She notes that she doesn't want to add medicine right now. Continue current regimen. Call with any concerns.

## 2017-06-05 NOTE — Progress Notes (Signed)
BP 119/83 (BP Location: Left Arm, Patient Position: Sitting, Cuff Size: Normal)   Pulse 73   Temp 97.8 F (36.6 C)   LMP  (LMP Unknown)   SpO2 94%    Subjective:    Patient ID: Cynthia Vaughan, female    DOB: 1941/03/31, 76 y.o.   MRN: [id number]  HPI: Cynthia Vaughan is a 76 y.o. female  Chief Complaint  Patient presents with  . Sore Throat  . Allergies    Refill on Flonase and inhaler  . Depression   DEPRESSION Mood status: thinks that she is feeling a little worse because she doesn't have any power right now Satisfied with current treatment?: yes Symptom severity: moderate  Duration of current treatment : chronic Side effects: no Medication compliance: excellent compliance Psychotherapy/counseling: no  Previous psychiatric medications: sertraline Depressed mood: yes Anxious mood: yes Anhedonia: no Significant weight loss or gain: no Insomnia: no  Fatigue: yes Feelings of worthlessness or guilt: no Impaired concentration/indecisiveness: no Suicidal ideations: no Hopelessness: no Crying spells: no Depression screen [facility] 2/9 06/05/2017 05/08/2017 04/20/2017 06/21/2016 02/22/2016  Decreased Interest 2 2 0 1 0  Down, Depressed, Hopeless 2 1 0 1 1  PHQ - 2 Score 4 3 0 2 1  Altered sleeping 0 1 - - -  Tired, decreased energy 3 1 - - -  Change in appetite 0 1 - - -  Feeling bad or failure about yourself  0 0 - - -  Trouble concentrating 3 0 - - -  Moving slowly or fidgety/restless 1 0 - - -  Suicidal thoughts 0 0 - - -  PHQ-9 Score 11 6 - - -   UPPER RESPIRATORY TRACT INFECTION Duration: has been having issues with watery eyes for about a month Worst symptom: sore throat, watery eyes Fever: no Cough: yes Shortness of breath: no Wheezing: yes Chest pain: no Chest tightness: no Chest congestion: no Nasal congestion: yes Runny nose: yes Post nasal drip: no Sneezing: yes Sore throat: yes Swollen glands: no Sinus pressure: no Headache: yes Face pain:  no Toothache: yes Ear pain: yes bilateral Ear pressure: yes bilateral Eyes red/itching:yes Eye drainage/crusting: yes  Vomiting: no Rash: no Fatigue: yes Sick contacts: no Strep contacts: no  Context: stable Recurrent sinusitis: no Relief with OTC cold/cough medications: no  Treatments attempted: anti-histamine   Relevant past medical, surgical, family and social history reviewed and updated as indicated. Interim medical history since our last visit reviewed. Allergies and medications reviewed and updated.  Review of Systems  Constitutional: Negative.   HENT: Positive for congestion. Negative for dental problem, drooling, ear discharge, ear pain, facial swelling, hearing loss, mouth sores, nosebleeds, postnasal drip, rhinorrhea, sinus pain, sinus pressure, sneezing, sore throat, tinnitus, trouble swallowing and voice change.   Respiratory: Negative.   Cardiovascular: Negative.   Psychiatric/Behavioral: Negative.     Per HPI unless specifically indicated above     Objective:    BP 119/83 (BP Location: Left Arm, Patient Position: Sitting, Cuff Size: Normal)   Pulse 73   Temp 97.8 F (36.6 C)   LMP  (LMP Unknown)   SpO2 94%   Wt Readings from Last 3 Encounters:  05/08/17 146 lb 6 oz (66.4 kg)  04/20/17 146 lb 3.2 oz (66.3 kg)  12/12/16 146 lb (66.2 kg)    Physical Exam  Constitutional: She is oriented to person, place, and time. She appears well-developed and well-nourished. No distress.  HENT:  Head: Normocephalic and atraumatic.  Right  Ear: Hearing normal.  Left Ear: Hearing normal.  Nose: Nose normal.  Eyes: Conjunctivae and lids are normal. Right eye exhibits no discharge. Left eye exhibits no discharge. No scleral icterus.  Cardiovascular: Normal rate, regular rhythm, normal heart sounds and intact distal pulses.  Exam reveals no gallop and no friction rub.   No murmur heard. Pulmonary/Chest: Effort normal and breath sounds normal. No respiratory distress. She  has no wheezes. She has no rales. She exhibits no tenderness.  Musculoskeletal: Normal range of motion.  Neurological: She is alert and oriented to person, place, and time.  Skin: Skin is warm, dry and intact. No rash noted. She is not diaphoretic. No erythema. No pallor.  Psychiatric: She has a normal mood and affect. Her speech is normal and behavior is normal. Judgment and thought content normal. Cognition and memory are normal.  Nursing note and vitals reviewed.   Results for orders placed or performed in visit on 05/08/17  Microscopic Examination  Result Value Ref Range   WBC, UA 0-5 0 - 5 /hpf   RBC, UA 0-2 0 - 2 /hpf   Epithelial Cells (non renal) >10 (A) 0 - 10 /hpf   Bacteria, UA Few None seen/Few   Yeast, UA Present (A) None seen  CBC with Differential/Platelet  Result Value Ref Range   WBC 6.9 3.4 - 10.8 x10E3/uL   RBC 4.70 3.77 - 5.28 x10E6/uL   Hemoglobin 14.4 11.1 - 15.9 g/dL   Hematocrit 43.4 34.0 - 46.6 %   MCV 92 79 - 97 fL   MCH 30.6 26.6 - 33.0 pg   MCHC 33.2 31.5 - 35.7 g/dL   RDW 13.7 12.3 - 15.4 %   Platelets 205 150 - 379 x10E3/uL   Neutrophils 57 Not Estab. %   Lymphs 31 Not Estab. %   Monocytes 9 Not Estab. %   Eos 3 Not Estab. %   Basos 0 Not Estab. %   Neutrophils Absolute 3.9 1.4 - 7.0 x10E3/uL   Lymphocytes Absolute 2.2 0.7 - 3.1 x10E3/uL   Monocytes Absolute 0.6 0.1 - 0.9 x10E3/uL   EOS (ABSOLUTE) 0.2 0.0 - 0.4 x10E3/uL   Basophils Absolute 0.0 0.0 - 0.2 x10E3/uL   Immature Granulocytes 0 Not Estab. %   Immature Grans (Abs) 0.0 0.0 - 0.1 x10E3/uL  Comprehensive metabolic panel  Result Value Ref Range   Glucose 117 (H) 65 - 99 mg/dL   BUN 11 8 - 27 mg/dL   Creatinine, Ser 0.89 0.57 - 1.00 mg/dL   GFR calc non Af Amer 64 >59 mL/min/1.73   GFR calc Af Amer 73 >59 mL/min/1.73   BUN/Creatinine Ratio 12 12 - 28   Sodium 141 134 - 144 mmol/L   Potassium 3.8 3.5 - 5.2 mmol/L   Chloride 103 96 - 106 mmol/L   CO2 21 20 - 29 mmol/L   Calcium 9.6  8.7 - 10.3 mg/dL   Total Protein 7.2 6.0 - 8.5 g/dL   Albumin 4.4 3.5 - 4.8 g/dL   Globulin, Total 2.8 1.5 - 4.5 g/dL   Albumin/Globulin Ratio 1.6 1.2 - 2.2   Bilirubin Total 0.3 0.0 - 1.2 mg/dL   Alkaline Phosphatase 83 39 - 117 IU/L   AST 21 0 - 40 IU/L   ALT 16 0 - 32 IU/L  Lipid Panel w/o Chol/HDL Ratio  Result Value Ref Range   Cholesterol, Total 253 (H) 100 - 199 mg/dL   Triglycerides 206 (H) 0 - 149 mg/dL   HDL  60 >39 mg/dL   VLDL Cholesterol Cal 41 (H) 5 - 40 mg/dL   LDL Calculated 152 (H) 0 - 99 mg/dL  Microalbumin, Urine Waived  Result Value Ref Range   Microalb, Ur Waived 80 (H) 0 - 19 mg/L   Creatinine, Urine Waived 50 10 - 300 mg/dL   Microalb/Creat Ratio 30-300 (H) <30 mg/g  TSH  Result Value Ref Range   TSH 3.500 0.450 - 4.500 uIU/mL  UA/M w/rflx Culture, Routine  Result Value Ref Range   Specific Gravity, UA 1.010 1.005 - 1.030   pH, UA 6.0 5.0 - 7.5   Color, UA Yellow Yellow   Appearance Ur Cloudy (A) Clear   Leukocytes, UA Trace (A) Negative   Protein, UA 1+ (A) Negative/Trace   Glucose, UA Negative Negative   Ketones, UA Negative Negative   RBC, UA Trace (A) Negative   Bilirubin, UA Negative Negative   Urobilinogen, Ur 0.2 0.2 - 1.0 mg/dL   Nitrite, UA Negative Negative   Microscopic Examination See below:       Assessment & Plan:   Problem List Items Addressed This Visit      Other   Depression - Primary    Slightly exacerbated due to current issues. She notes that she doesn't want to add medicine right now. Continue current regimen. Call with any concerns.        Other Visit Diagnoses    Seasonal allergic rhinitis due to pollen       Will restart her flonase. Call if not improving or worsening.       Follow up plan: Return January , for 6 month follow up.

## 2017-06-06 ENCOUNTER — Ambulatory Visit: Payer: Medicare Other | Admitting: Family Medicine

## 2017-06-21 ENCOUNTER — Encounter: Payer: Self-pay | Admitting: Family Medicine

## 2017-06-21 ENCOUNTER — Ambulatory Visit (INDEPENDENT_AMBULATORY_CARE_PROVIDER_SITE_OTHER): Payer: Medicare Other | Admitting: Family Medicine

## 2017-06-21 VITALS — BP 138/88 | HR 75 | Temp 97.8°F | Wt 146.0 lb

## 2017-06-21 DIAGNOSIS — Z23 Encounter for immunization: Secondary | ICD-10-CM | POA: Diagnosis not present

## 2017-06-21 DIAGNOSIS — M25551 Pain in right hip: Secondary | ICD-10-CM | POA: Diagnosis not present

## 2017-06-21 NOTE — Progress Notes (Signed)
   BP 138/88   Pulse 75   Temp 97.8 F (36.6 C)   Wt 146 lb (66.2 kg)   LMP  (LMP Unknown)   SpO2 95%   BMI 29.49 kg/m    Subjective:    Patient ID: Cynthia Vaughan, female    DOB: 1941/07/06, 76 y.o.   MRN: [id number]  HPI: Cynthia Vaughan is a 76 y.o. female  Chief Complaint  Patient presents with  . Hip Pain    right for over a month.No known injury.   Patient here with over a month of right lateral hip aching. No known injury. Denies joint locking up, numbness or tingling radiating down leg, back problems. Had this issue many years ago and had an injection which seemed to help quite a bit. When going down stairs, it's painful in the knee as well. Has not been trying anything OTC for symptoms. Does have a hx of arthritis. Very interested in getting another injection today.   Relevant past medical, surgical, family and social history reviewed and updated as indicated. Interim medical history since our last visit reviewed. Allergies and medications reviewed and updated.  Review of Systems  Constitutional: Negative.   HENT: Negative.   Respiratory: Negative.   Cardiovascular: Negative.   Gastrointestinal: Negative.   Genitourinary: Negative.   Musculoskeletal: Positive for arthralgias.  Neurological: Negative.   Psychiatric/Behavioral: Negative.     Per HPI unless specifically indicated above     Objective:    BP 138/88   Pulse 75   Temp 97.8 F (36.6 C)   Wt 146 lb (66.2 kg)   LMP  (LMP Unknown)   SpO2 95%   BMI 29.49 kg/m   Wt Readings from Last 3 Encounters:  06/21/17 146 lb (66.2 kg)  05/08/17 146 lb 6 oz (66.4 kg)  04/20/17 146 lb 3.2 oz (66.3 kg)    Physical Exam  Constitutional: She is oriented to person, place, and time. She appears well-developed and well-nourished. No distress.  HENT:  Head: Atraumatic.  Eyes: Pupils are equal, round, and reactive to light. Conjunctivae are normal. No scleral icterus.  Neck: Normal range of motion. Neck supple.   Cardiovascular: Normal rate and normal heart sounds.   Pulmonary/Chest: Effort normal and breath sounds normal. No respiratory distress.  Musculoskeletal: Normal range of motion. She exhibits tenderness (Mild medial right knee ttp and lateral hip ttp).  No subjective pain with PROM of b/l hips or knees  Neurological: She is alert and oriented to person, place, and time.  Skin: Skin is warm and dry.  Psychiatric: She has a normal mood and affect. Her behavior is normal.  Nursing note and vitals reviewed.     Assessment & Plan:   Problem List Items Addressed This Visit    None    Visit Diagnoses    Right hip pain    -  Primary   Likely arthritis flare, discussed OTC tx with tylenol and NSAIDs, lidocaine patches, muscle rubs. Pt insistent on injection. Referral to ortho for eval for this   Relevant Orders   AMB referral to orthopedics   Needs flu shot       Encounter for immunization       Relevant Orders   Flu vaccine HIGH DOSE PF (Completed)       Follow up plan: Return if symptoms worsen or fail to improve.

## 2017-06-21 NOTE — Patient Instructions (Signed)
Follow up as needed

## 2017-06-22 ENCOUNTER — Other Ambulatory Visit: Payer: Self-pay | Admitting: Family Medicine

## 2017-06-23 ENCOUNTER — Telehealth: Payer: Self-pay | Admitting: Family Medicine

## 2017-06-23 NOTE — Telephone Encounter (Signed)
Copied from [place] #[id number]. Topic: Inquiry >> Jun 22, 2017  3:35 PM Aurelio Brash B wrote: Reason for CRM: Pt is asking if she can get something prescribed for her arthritis.

## 2017-06-23 NOTE — Telephone Encounter (Signed)
Please see if she has a refill on her meloxicam. I can get her that if she's out. If she needs something more, she will need to be seen.

## 2017-06-23 NOTE — Telephone Encounter (Signed)
Patient was not aware that is what the medication was for. She will try it.

## 2017-07-18 DIAGNOSIS — H10503 Unspecified blepharoconjunctivitis, bilateral: Secondary | ICD-10-CM | POA: Diagnosis not present

## 2017-07-23 ENCOUNTER — Other Ambulatory Visit: Payer: Self-pay | Admitting: Family Medicine

## 2017-07-31 ENCOUNTER — Ambulatory Visit: Payer: Self-pay | Admitting: *Deleted

## 2017-07-31 NOTE — Telephone Encounter (Signed)
  Reason for Disposition . [1] MODERATE pain (e.g., interferes with normal activities, limping) AND [2] present > 3 days  Answer Assessment - Initial Assessment Questions 1. LOCATION and RADIATION: "Where is the pain located?"      Right sided hip pain goes down through knee and lower 2. QUALITY: "What does the pain feel like?"  (e.g., sharp, dull, aching, burning)     aching 3. SEVERITY: "How bad is the pain?" "What does it keep you from doing?"   (Scale 1-10; or mild, moderate, severe)   -  MILD (1-3): doesn't interfere with normal activities    -  MODERATE (4-7): interferes with normal activities (e.g., work or school) or awakens from sleep, limping    -  SEVERE (8-10): excruciating pain, unable to do any normal activities, unable to walk     Hurts with every step 4. ONSET: "When did the pain start?" "Does it come and go, or is it there all the time?"     today 5. WORK OR EXERCISE: "Has there been any recent work or exercise that involved this part of the body?"     no 6. CAUSE: "What do you think is causing the hip pain?"      arthritis 7. AGGRAVATING FACTORS: "What makes the hip pain worse?" (e.g., walking, climbing stairs, running)     walking 8. OTHER SYMPTOMS: "Do you have any other symptoms?" (e.g., back pain, pain shooting down leg,  fever, rash)   no  Protocols used: HIP PAIN-A-AH

## 2017-08-01 ENCOUNTER — Ambulatory Visit (INDEPENDENT_AMBULATORY_CARE_PROVIDER_SITE_OTHER): Payer: Medicare Other | Admitting: Family Medicine

## 2017-08-01 ENCOUNTER — Ambulatory Visit: Payer: Self-pay | Admitting: Family Medicine

## 2017-08-01 ENCOUNTER — Encounter: Payer: Self-pay | Admitting: Family Medicine

## 2017-08-01 VITALS — BP 159/86 | HR 83 | Wt 147.4 lb

## 2017-08-01 DIAGNOSIS — M25551 Pain in right hip: Secondary | ICD-10-CM | POA: Diagnosis not present

## 2017-08-01 MED ORDER — PREDNISONE 20 MG PO TABS: 20.0000 mg | ORAL_TABLET | Freq: Every day | ORAL | 0 refills | Status: DC

## 2017-08-01 NOTE — Progress Notes (Signed)
BP (!) 159/86 (BP Location: Left Arm, Patient Position: Sitting, Cuff Size: Normal)   Pulse 83   Wt 147 lb 6 oz (66.8 kg)   LMP  (LMP Unknown)   SpO2 95%   BMI 29.77 kg/m    Subjective:    Patient ID: Cynthia Vaughan, female    DOB: 1941-05-22, 76 y.o.   MRN: [id number]  HPI: Cynthia Vaughan is a 76 y.o. female  Chief Complaint  Patient presents with  . Knee Pain    right  . Hip Pain   HIP PAIN Duration: chronic- hasn't gotten any better since Halloween Involved hip: right  Mechanism of injury: unknown Location: lateral Onset: gradual  Severity: severe  Quality: aching Frequency: constant Radiation: yes- into her knee Aggravating factors: bending over    Alleviating factors: heat   Status: worse Treatments attempted: rest, ice, heat, APAP and ibuprofen   Relief with NSAIDs?: moderate Weakness with weight bearing: yes Weakness with walking: yes Paresthesias / decreased sensation: yes- in the hip itself Swelling: yes Redness:no Fevers: no  Relevant past medical, surgical, family and social history reviewed and updated as indicated. Interim medical history since our last visit reviewed. Allergies and medications reviewed and updated.  Review of Systems  Constitutional: Negative.   Respiratory: Negative.   Cardiovascular: Negative.   Musculoskeletal: Positive for arthralgias, back pain and gait problem. Negative for joint swelling, myalgias, neck pain and neck stiffness.  Neurological: Negative for dizziness, tremors, seizures, syncope, facial asymmetry, speech difficulty, weakness, light-headedness, numbness and headaches.  Psychiatric/Behavioral: Negative.    Per HPI unless specifically indicated above     Objective:    BP (!) 159/86 (BP Location: Left Arm, Patient Position: Sitting, Cuff Size: Normal)   Pulse 83   Wt 147 lb 6 oz (66.8 kg)   LMP  (LMP Unknown)   SpO2 95%   BMI 29.77 kg/m   Wt Readings from Last 3 Encounters:  08/01/17 147 lb 6 oz  (66.8 kg)  06/21/17 146 lb (66.2 kg)  05/08/17 146 lb 6 oz (66.4 kg)    Physical Exam  Constitutional: She is oriented to person, place, and time. She appears well-developed and well-nourished. No distress.  HENT:  Head: Normocephalic and atraumatic.  Right Ear: Hearing normal.  Left Ear: Hearing normal.  Nose: Nose normal.  Eyes: Conjunctivae and lids are normal. Right eye exhibits no discharge. Left eye exhibits no discharge. No scleral icterus.  Cardiovascular: Normal rate, regular rhythm, normal heart sounds and intact distal pulses. Exam reveals no gallop and no friction rub.  No murmur heard. Pulmonary/Chest: Effort normal and breath sounds normal. No respiratory distress. She has no wheezes. She has no rales. She exhibits no tenderness.  Musculoskeletal: She exhibits tenderness. She exhibits no edema or deformity.  Tenderness along joint line, negative anterior and posterior drawer test, negative appley's compression and distraction, Negative McMurrays  Neurological: She is alert and oriented to person, place, and time.  Skin: Skin is warm, dry and intact. No rash noted. She is not diaphoretic. No erythema. No pallor.  Psychiatric: She has a normal mood and affect. Her speech is normal and behavior is normal. Judgment and thought content normal. Cognition and memory are normal.  Hip Exam: Right     Tenderness to palpation:      Greater trochanter: no      Anterior superior iliac spine: yes     Anterior hip: yes     Iliac crest: yes  Iliac tubercle: no     Pubic tubercle: no     SI joint: yes      Range of Motion:     Flexion: Decreased    Extension: Decreased    Abduction: Decreased    Adduction: Decreased    Internal rotation: Decreased    External rotation: Decreased     Muscle Strength:  5/5 bilaterally     Special Tests:    Ober test: negative    FABER test:positive- anteriorly    Results for orders placed or performed in visit on 05/08/17  Microscopic  Examination  Result Value Ref Range   WBC, UA 0-5 0 - 5 /hpf   RBC, UA 0-2 0 - 2 /hpf   Epithelial Cells (non renal) >10 (A) 0 - 10 /hpf   Bacteria, UA Few None seen/Few   Yeast, UA Present (A) None seen  CBC with Differential/Platelet  Result Value Ref Range   WBC 6.9 3.4 - 10.8 x10E3/uL   RBC 4.70 3.77 - 5.28 x10E6/uL   Hemoglobin 14.4 11.1 - 15.9 g/dL   Hematocrit 43.4 34.0 - 46.6 %   MCV 92 79 - 97 fL   MCH 30.6 26.6 - 33.0 pg   MCHC 33.2 31.5 - 35.7 g/dL   RDW 13.7 12.3 - 15.4 %   Platelets 205 150 - 379 x10E3/uL   Neutrophils 57 Not Estab. %   Lymphs 31 Not Estab. %   Monocytes 9 Not Estab. %   Eos 3 Not Estab. %   Basos 0 Not Estab. %   Neutrophils Absolute 3.9 1.4 - 7.0 x10E3/uL   Lymphocytes Absolute 2.2 0.7 - 3.1 x10E3/uL   Monocytes Absolute 0.6 0.1 - 0.9 x10E3/uL   EOS (ABSOLUTE) 0.2 0.0 - 0.4 x10E3/uL   Basophils Absolute 0.0 0.0 - 0.2 x10E3/uL   Immature Granulocytes 0 Not Estab. %   Immature Grans (Abs) 0.0 0.0 - 0.1 x10E3/uL  Comprehensive metabolic panel  Result Value Ref Range   Glucose 117 (H) 65 - 99 mg/dL   BUN 11 8 - 27 mg/dL   Creatinine, Ser 0.89 0.57 - 1.00 mg/dL   GFR calc non Af Amer 64 >59 mL/min/1.73   GFR calc Af Amer 73 >59 mL/min/1.73   BUN/Creatinine Ratio 12 12 - 28   Sodium 141 134 - 144 mmol/L   Potassium 3.8 3.5 - 5.2 mmol/L   Chloride 103 96 - 106 mmol/L   CO2 21 20 - 29 mmol/L   Calcium 9.6 8.7 - 10.3 mg/dL   Total Protein 7.2 6.0 - 8.5 g/dL   Albumin 4.4 3.5 - 4.8 g/dL   Globulin, Total 2.8 1.5 - 4.5 g/dL   Albumin/Globulin Ratio 1.6 1.2 - 2.2   Bilirubin Total 0.3 0.0 - 1.2 mg/dL   Alkaline Phosphatase 83 39 - 117 IU/L   AST 21 0 - 40 IU/L   ALT 16 0 - 32 IU/L  Lipid Panel w/o Chol/HDL Ratio  Result Value Ref Range   Cholesterol, Total 253 (H) 100 - 199 mg/dL   Triglycerides 206 (H) 0 - 149 mg/dL   HDL 60 >39 mg/dL   VLDL Cholesterol Cal 41 (H) 5 - 40 mg/dL   LDL Calculated 152 (H) 0 - 99 mg/dL  Microalbumin, Urine  Waived  Result Value Ref Range   Microalb, Ur Waived 80 (H) 0 - 19 mg/L   Creatinine, Urine Waived 50 10 - 300 mg/dL   Microalb/Creat Ratio 30-300 (H) <30 mg/g  TSH  Result Value Ref Range   TSH 3.500 0.450 - 4.500 uIU/mL  UA/M w/rflx Culture, Routine  Result Value Ref Range   Specific Gravity, UA 1.010 1.005 - 1.030   pH, UA 6.0 5.0 - 7.5   Color, UA Yellow Yellow   Appearance Ur Cloudy (A) Clear   Leukocytes, UA Trace (A) Negative   Protein, UA 1+ (A) Negative/Trace   Glucose, UA Negative Negative   Ketones, UA Negative Negative   RBC, UA Trace (A) Negative   Bilirubin, UA Negative Negative   Urobilinogen, Ur 0.2 0.2 - 1.0 mg/dL   Nitrite, UA Negative Negative   Microscopic Examination See below:       Assessment & Plan:   Problem List Items Addressed This Visit    None    Visit Diagnoses    Right hip pain    -  Primary   Has not seen ortho. Will treat with burst of prednisone. Will get into ortho- appointment scheduled today. Call if not getting better or getting worse.        Follow up plan: Return As scheduled.

## 2017-08-03 DIAGNOSIS — M7071 Other bursitis of hip, right hip: Secondary | ICD-10-CM | POA: Diagnosis not present

## 2017-08-30 ENCOUNTER — Other Ambulatory Visit: Payer: Self-pay

## 2017-08-30 ENCOUNTER — Encounter: Payer: Self-pay | Admitting: Emergency Medicine

## 2017-08-30 ENCOUNTER — Emergency Department
Admission: EM | Admit: 2017-08-30 | Discharge: 2017-08-30 | Disposition: A | Discharge status: Home / Self Care | Payer: Medicare Other | Attending: Emergency Medicine | Admitting: Emergency Medicine

## 2017-08-30 DIAGNOSIS — F329 Major depressive disorder, single episode, unspecified: Secondary | ICD-10-CM | POA: Insufficient documentation

## 2017-08-30 DIAGNOSIS — Z87891 Personal history of nicotine dependence: Secondary | ICD-10-CM | POA: Insufficient documentation

## 2017-08-30 DIAGNOSIS — M79641 Pain in right hand: Secondary | ICD-10-CM | POA: Diagnosis present

## 2017-08-30 DIAGNOSIS — N182 Chronic kidney disease, stage 2 (mild): Secondary | ICD-10-CM | POA: Insufficient documentation

## 2017-08-30 DIAGNOSIS — Z85828 Personal history of other malignant neoplasm of skin: Secondary | ICD-10-CM | POA: Diagnosis not present

## 2017-08-30 DIAGNOSIS — F419 Anxiety disorder, unspecified: Secondary | ICD-10-CM | POA: Diagnosis not present

## 2017-08-30 DIAGNOSIS — I129 Hypertensive chronic kidney disease with stage 1 through stage 4 chronic kidney disease, or unspecified chronic kidney disease: Secondary | ICD-10-CM | POA: Diagnosis not present

## 2017-08-30 LAB — CBC WITH DIFFERENTIAL/PLATELET
BASOS ABS: 0 10*3/uL (ref 0–0.1)
BASOS PCT: 1 %
EOS ABS: 0.2 10*3/uL (ref 0–0.7)
EOS PCT: 3 %
HCT: 41.8 % (ref 35.0–47.0)
HEMOGLOBIN: 13.9 g/dL (ref 12.0–16.0)
LYMPHS ABS: 1.6 10*3/uL (ref 1.0–3.6)
Lymphocytes Relative: 24 %
MCH: 31.6 pg (ref 26.0–34.0)
MCHC: 33.2 g/dL (ref 32.0–36.0)
MCV: 95.1 fL (ref 80.0–100.0)
Monocytes Absolute: 0.6 10*3/uL (ref 0.2–0.9)
Monocytes Relative: 8 %
NEUTROS PCT: 64 %
Neutro Abs: 4.4 10*3/uL (ref 1.4–6.5)
PLATELETS: 223 10*3/uL (ref 150–440)
RBC: 4.4 MIL/uL (ref 3.80–5.20)
RDW: 13.2 % (ref 11.5–14.5)
WBC: 6.8 10*3/uL (ref 3.6–11.0)

## 2017-08-30 LAB — COMPREHENSIVE METABOLIC PANEL
ALK PHOS: 70 U/L (ref 38–126)
ALT: 21 U/L (ref 14–54)
AST: 24 U/L (ref 15–41)
Albumin: 4.2 g/dL (ref 3.5–5.0)
Anion gap: 10 (ref 5–15)
BUN: 16 mg/dL (ref 6–20)
CALCIUM: 9.2 mg/dL (ref 8.9–10.3)
CHLORIDE: 105 mmol/L (ref 101–111)
CO2: 24 mmol/L (ref 22–32)
CREATININE: 0.9 mg/dL (ref 0.44–1.00)
GFR calc non Af Amer: >60 mL/min (ref >60)
GLUCOSE: 102 mg/dL — AB (ref 65–99)
Potassium: 3.4 mmol/L — ABNORMAL LOW (ref 3.5–5.1)
SODIUM: 139 mmol/L (ref 135–145)
Total Bilirubin: 0.8 mg/dL (ref 0.3–1.2)
Total Protein: 7.5 g/dL (ref 6.5–8.1)

## 2017-08-30 LAB — TROPONIN I: Troponin I: <0.03 ng/mL (ref <0.03)

## 2017-08-30 LAB — TSH: TSH: 1.217 u[IU]/mL (ref 0.350–4.500)

## 2017-08-30 MED ORDER — LORAZEPAM 0.5 MG PO TABS
0.5000 mg | ORAL_TABLET | Freq: Once | ORAL | Status: AC
  Administered 2017-08-30: 0.5 mg via ORAL
  Filled 2017-08-30: qty 1

## 2017-08-30 MED ORDER — LORAZEPAM 1 MG PO TABS: 1.0000 mg | ORAL_TABLET | Freq: Two times a day (BID) | ORAL | 0 refills | Status: DC

## 2017-08-30 NOTE — ED Notes (Signed)
Pt presents with anxiety; states "it has happened a lot lately." Pt states she takes buspar 2x per day, record shows 3x per day prescribed. Pt states she has been taking her medications as directed.

## 2017-08-30 NOTE — ED Notes (Signed)
Pt discharged home after verbalizing understanding of discharge instructions; nad noted. 

## 2017-08-30 NOTE — ED Triage Notes (Addendum)
Pt to ed with c/o right hand pain that radiates to head and down to right leg that happened last night.  States pain is better today.    Pt states " I just have a nervous feeling today"  States hx of same.  Reports this feels similar to before when she was told she had anxiety.  Pt states she takes multiple meds and does not know the names of any of them.  Pt denies chest pain, denies headache today. Denies si, denies hi.

## 2017-08-30 NOTE — ED Provider Notes (Signed)
[facility] Emergency Department Provider Note       Time seen: ----------------------------------------- 10:56 AM on 08/30/2017 -----------------------------------------   I have reviewed the triage vital signs and the nursing notes.  HISTORY   Chief Complaint Anxiety    HPI Cynthia Vaughan is a 77 y.o. female with a history of hypertension, arthritis, anxiety, allergies, depression who presents to the ED for "her nerves".  Patient planes of right hand pain that radiates to her head and down her right leg that happened last night.  She reports the pain is better today.  She reports she has nervous feeling today, she reports she feels similar to when she was told she had anxiety.  She takes multiple medications but does not know the names of them.  She does not take any medicines that she no other for anxiety.  She states she is only sleeping 1-2 hours a night and then she is cleaning her house.  She does report drinking coffee frequently.  Past Medical History:  Diagnosis Date  . Allergy   . Anxiety   . Cancer ([place])    SKIN  . CKD (chronic kidney disease) stage 2, GFR 60-89 ml/min   . Depression   . Edema    LEGS/FEET  . GERD (gastroesophageal reflux disease)   . Heart murmur   . Hyperlipidemia   . Hypertension   . Hypopotassemia   . Microalbuminuria   . Osteoarthritis   . Osteopenia   . [facility] spotted fever) 05/10/2016  . Urinary incontinence   . Vitamin D deficiency disease   . Wheezing     Patient Active Problem List   Diagnosis Date Noted  . Rotator cuff impingement syndrome of right shoulder 12/08/2015  . Wrist pain, left 07/07/2015  . NSAID long-term use 07/07/2015  . Anxiety   . Depression   . Hyperlipidemia   . Benign hypertensive renal disease   . Osteoarthritis   . Urinary incontinence   . Osteopenia   . Vitamin D deficiency disease   . Microalbuminuria   . CKD (chronic kidney disease) stage 2, GFR 60-89  ml/min     Past Surgical History:  Procedure Laterality Date  . ABDOMINAL HYSTERECTOMY  2004   Total (due to bladder surgery)  . BLADDER SURGERY     x 2  . BUNIONECTOMY    . CATARACT EXTRACTION W/PHACO Right 11/23/2015   Procedure: CATARACT EXTRACTION PHACO AND INTRAOCULAR LENS PLACEMENT (IOC);  Surgeon: Estill Cotta, MD;  Location: [facility] ORS;  Service: Ophthalmology;  Laterality: Right;  [place]    1:30.9 AP%  26.9 CDE   42.29 flud casette lot # [id number]  exp05/31/2018  . COLONOSCOPY      Allergies Codeine and Lisinopril  Social History Social History   Tobacco Use  . Smoking status: Former Research scientist (life sciences)  . Smokeless tobacco: Never Used  . Tobacco comment: quit 20 years ago   Substance Use Topics  . Alcohol use: Yes    Comment: OCCAS  . Drug use: No    Review of Systems Constitutional: Negative for fever. Eyes: Negative for vision changes ENT:  Negative for congestion, sore throat Cardiovascular: Negative for chest pain. Respiratory: Negative for shortness of breath. Gastrointestinal: Negative for abdominal pain, vomiting and diarrhea. Genitourinary: Negative for dysuria. Musculoskeletal: Negative for back pain. Skin: Negative for rash. Neurological: Negative for headaches, focal weakness or numbness. Psychiatric: Positive for anxiety  All systems negative/normal/unremarkable except as stated in the HPI  ____________________________________________  PHYSICAL EXAM:  VITAL SIGNS: ED Triage Vitals  Enc Vitals Group     BP 08/30/17 0944 133/74     Pulse Rate 08/30/17 0944 92     Resp 08/30/17 0944 15     Temp 08/30/17 0944 98.5 F (36.9 C)     Temp Source 08/30/17 0944 Oral     SpO2 08/30/17 0944 94 %     Weight 08/30/17 0944 150 lb (68 kg)     Height 08/30/17 0944 5' (1.524 m)     Head Circumference --      Peak Flow --      Pain Score 08/30/17 0949 0     Pain Loc --      Pain Edu? --      Excl. in [place]? --     Constitutional: Alert and oriented. Well  appearing and in no distress. Eyes: Conjunctivae are normal. Normal extraocular movements. ENT   Head: Normocephalic and atraumatic.   Nose: No congestion/rhinnorhea.   Mouth/Throat: Mucous membranes are moist.   Neck: No stridor. Cardiovascular: Normal rate, regular rhythm. No murmurs, rubs, or gallops. Respiratory: Normal respiratory effort without tachypnea nor retractions. Breath sounds are clear and equal bilaterally. No wheezes/rales/rhonchi. Gastrointestinal: Soft and nontender. Normal bowel sounds Musculoskeletal: Nontender with normal range of motion in extremities. No lower extremity tenderness nor edema. Neurologic:  Normal speech and language. No gross focal neurologic deficits are appreciated.  Skin:  Skin is warm, dry and intact. No rash noted. Psychiatric: Mood and affect are normal. Speech and behavior are normal.  ___________________________________________  ED COURSE:  As part of my medical decision making, I reviewed the following data within the [place] History obtained from family if available, nursing notes, old chart and ekg, as well as notes from prior ED visits. Patient presented for symptoms of anxiety, we will assess with labs and imaging as indicated at this time.   Procedures ____________________________________________   LABS (pertinent positives/negatives)  Labs Reviewed  COMPREHENSIVE METABOLIC PANEL - Abnormal; Notable for the following components:      Result Value   Potassium 3.4 (*)    Glucose, Bld 102 (*)    All other components within normal limits  CBC WITH DIFFERENTIAL/PLATELET  TROPONIN I  TSH  URINALYSIS, COMPLETE (UACMP) WITH MICROSCOPIC  ____________________________________________  DIFFERENTIAL DIAGNOSIS   Anxiety, depression, arrhythmia, anemia, electrolyte abnormality, hyperthyroidism  FINAL ASSESSMENT AND PLAN  Anxiety   Plan: Patient had presented for anxious symptoms. Patient's labs are  reassuring.  I will prescribe Ativan that she can take as needed for anxiety.  She is supposed to be taking BuSpar.  Overall she appears very medically stable for outpatient follow-up.   Earleen Newport, MD   Note: This note was generated in part or whole with voice recognition software. Voice recognition is usually quite accurate but there are transcription errors that can and very often do occur. I apologize for any typographical errors that were not detected and corrected.     Earleen Newport, MD 08/30/17 1302

## 2017-09-12 ENCOUNTER — Encounter: Payer: Self-pay | Admitting: Family Medicine

## 2017-09-12 ENCOUNTER — Ambulatory Visit (INDEPENDENT_AMBULATORY_CARE_PROVIDER_SITE_OTHER): Payer: Medicare Other | Admitting: Family Medicine

## 2017-09-12 VITALS — BP 106/74 | HR 92 | Temp 97.9°F | Wt 146.6 lb

## 2017-09-12 DIAGNOSIS — E559 Vitamin D deficiency, unspecified: Secondary | ICD-10-CM

## 2017-09-12 DIAGNOSIS — M150 Primary generalized (osteo)arthritis: Secondary | ICD-10-CM | POA: Diagnosis not present

## 2017-09-12 DIAGNOSIS — F419 Anxiety disorder, unspecified: Secondary | ICD-10-CM

## 2017-09-12 DIAGNOSIS — R32 Unspecified urinary incontinence: Secondary | ICD-10-CM | POA: Diagnosis not present

## 2017-09-12 DIAGNOSIS — N182 Chronic kidney disease, stage 2 (mild): Secondary | ICD-10-CM | POA: Diagnosis not present

## 2017-09-12 DIAGNOSIS — E782 Mixed hyperlipidemia: Secondary | ICD-10-CM | POA: Diagnosis not present

## 2017-09-12 DIAGNOSIS — F331 Major depressive disorder, recurrent, moderate: Secondary | ICD-10-CM

## 2017-09-12 DIAGNOSIS — M159 Polyosteoarthritis, unspecified: Secondary | ICD-10-CM

## 2017-09-12 DIAGNOSIS — I129 Hypertensive chronic kidney disease with stage 1 through stage 4 chronic kidney disease, or unspecified chronic kidney disease: Secondary | ICD-10-CM

## 2017-09-12 MED ORDER — ARIPIPRAZOLE 5 MG PO TABS: 5.0000 mg | ORAL_TABLET | Freq: Every day | ORAL | 1 refills | Status: DC

## 2017-09-12 NOTE — Progress Notes (Signed)
BP 106/74 (BP Location: Left Arm, Cuff Size: Normal)   Pulse 92   Temp 97.9 F (36.6 C) (Oral)   Wt 146 lb 9.6 oz (66.5 kg)   LMP  (LMP Unknown)   SpO2 99%   BMI 28.63 kg/m    Subjective:    Patient ID: Cynthia Vaughan, female    DOB: [birth year], 77 y.o.   MRN: [id number]  HPI: Cynthia Vaughan is a 77 y.o. female  Chief Complaint  Patient presents with  . Depression  . Hyperlipidemia  . Hypertension   HYPERTENSION / [place] Satisfied with current treatment? yes Duration of hypertension: chronic BP monitoring frequency: not checking BP medication side effects: no Past BP meds: amlodipine, losartan Duration of hyperlipidemia: chronic Cholesterol medication side effects: no Cholesterol supplements: none Past cholesterol medications: atorvastain (lipitor) Medication compliance: good compliance Aspirin: yes Recent stressors: yes Recurrent headaches: no Visual changes: no Palpitations: no Dyspnea: no Chest pain: no Lower extremity edema: no Dizzy/lightheaded: no  DEPRESSION/ANXIETY Mood status: uncontrolled Satisfied with current treatment?: no Symptom severity: moderate  Duration of current treatment : chronic Side effects: no Medication compliance: good compliance Psychotherapy/counseling: no  Previous psychiatric medications: zoloft and buspar Depressed mood: no Anxious mood: yes Anhedonia: no Significant weight loss or gain: no Insomnia: no  Fatigue: yes Feelings of worthlessness or guilt: yes Impaired concentration/indecisiveness: yes Suicidal ideations: no Hopelessness: no Crying spells: yes Depression screen [facility] 2/9 09/12/2017 06/05/2017 05/08/2017 04/20/2017 06/21/2016  Decreased Interest 0 2 2 0 1  Down, Depressed, Hopeless 0 2 1 0 1  PHQ - 2 Score 0 4 3 0 2  Altered sleeping 1 0 1 - -  Tired, decreased energy 1 3 1  - -  Change in appetite 0 0 1 - -  Feeling bad or failure about yourself  0 0 0 - -  Trouble concentrating 0 3 0 - -    Moving slowly or fidgety/restless 0 1 0 - -  Suicidal thoughts 0 0 0 - -  PHQ-9 Score 2 11 6  - -    Relevant past medical, surgical, family and social history reviewed and updated as indicated. Interim medical history since our last visit reviewed. Allergies and medications reviewed and updated.  Review of Systems  Constitutional: Negative.   Respiratory: Negative.   Cardiovascular: Negative.   Psychiatric/Behavioral: Positive for agitation. Negative for behavioral problems, confusion, decreased concentration, dysphoric mood, hallucinations, self-injury, sleep disturbance and suicidal ideas. The patient is nervous/anxious. The patient is not hyperactive.     Per HPI unless specifically indicated above     Objective:    BP 106/74 (BP Location: Left Arm, Cuff Size: Normal)   Pulse 92   Temp 97.9 F (36.6 C) (Oral)   Wt 146 lb 9.6 oz (66.5 kg)   LMP  (LMP Unknown)   SpO2 99%   BMI 28.63 kg/m   Wt Readings from Last 3 Encounters:  09/12/17 146 lb 9.6 oz (66.5 kg)  08/30/17 150 lb (68 kg)  08/01/17 147 lb 6 oz (66.8 kg)    Physical Exam  Constitutional: She is oriented to person, place, and time. She appears well-developed and well-nourished. No distress.  HENT:  Head: Normocephalic and atraumatic.  Right Ear: Hearing normal.  Left Ear: Hearing normal.  Nose: Nose normal.  Eyes: Conjunctivae and lids are normal. Right eye exhibits no discharge. Left eye exhibits no discharge. No scleral icterus.  Cardiovascular: Normal rate, regular rhythm, normal heart sounds and intact distal pulses. Exam reveals  no gallop and no friction rub.  No murmur heard. Pulmonary/Chest: Effort normal and breath sounds normal. No respiratory distress. She has no wheezes. She has no rales. She exhibits no tenderness.  Musculoskeletal: Normal range of motion.  Neurological: She is alert and oriented to person, place, and time.  Skin: Skin is warm, dry and intact. No rash noted. She is not  diaphoretic. No erythema. No pallor.  Psychiatric: She has a normal mood and affect. Her speech is normal and behavior is normal. Judgment and thought content normal. Cognition and memory are normal.    Results for orders placed or performed during the hospital encounter of 08/30/17  CBC with Differential/Platelet  Result Value Ref Range   WBC 6.8 3.6 - 11.0 K/uL   RBC 4.40 3.80 - 5.20 MIL/uL   Hemoglobin 13.9 12.0 - 16.0 g/dL   HCT 41.8 35.0 - 47.0 %   MCV 95.1 80.0 - 100.0 fL   MCH 31.6 26.0 - 34.0 pg   MCHC 33.2 32.0 - 36.0 g/dL   RDW 13.2 11.5 - 14.5 %   Platelets 223 150 - 440 K/uL   Neutrophils Relative % 64 %   Neutro Abs 4.4 1.4 - 6.5 K/uL   Lymphocytes Relative 24 %   Lymphs Abs 1.6 1.0 - 3.6 K/uL   Monocytes Relative 8 %   Monocytes Absolute 0.6 0.2 - 0.9 K/uL   Eosinophils Relative 3 %   Eosinophils Absolute 0.2 0 - 0.7 K/uL   Basophils Relative 1 %   Basophils Absolute 0.0 0 - 0.1 K/uL  Comprehensive metabolic panel  Result Value Ref Range   Sodium 139 135 - 145 mmol/L   Potassium 3.4 (L) 3.5 - 5.1 mmol/L   Chloride 105 101 - 111 mmol/L   CO2 24 22 - 32 mmol/L   Glucose, Bld 102 (H) 65 - 99 mg/dL   BUN 16 6 - 20 mg/dL   Creatinine, Ser 0.90 0.44 - 1.00 mg/dL   Calcium 9.2 8.9 - 10.3 mg/dL   Total Protein 7.5 6.5 - 8.1 g/dL   Albumin 4.2 3.5 - 5.0 g/dL   AST 24 15 - 41 U/L   ALT 21 14 - 54 U/L   Alkaline Phosphatase 70 38 - 126 U/L   Total Bilirubin 0.8 0.3 - 1.2 mg/dL   GFR calc non Af Amer >60 >60 mL/min   GFR calc Af Amer >60 >60 mL/min   Anion gap 10 5 - 15  Troponin I  Result Value Ref Range   Troponin I <0.03 <0.03 ng/mL  TSH  Result Value Ref Range   TSH 1.217 0.350 - 4.500 uIU/mL      Assessment & Plan:   Problem List Items Addressed This Visit      Musculoskeletal and Integument   Osteoarthritis    Would like to see orthopedics for evaluation and shots. Referral generated today.      Relevant Orders   Ambulatory referral to Orthopedic  Surgery     Genitourinary   Benign hypertensive renal disease    Under good control on recheck. Continue current regimen. Continue to monitor. Call with any concerns.       Relevant Orders   CBC with Differential/Platelet   Comprehensive metabolic panel   Microalbumin, Urine Waived   TSH   CKD (chronic kidney disease) stage 2, GFR 60-89 ml/min    Rechecking levels today. Await results. Call with any concerns.       Relevant Orders   CBC  with Differential/Platelet   Comprehensive metabolic panel   Microalbumin, Urine Waived   TSH     Other   Anxiety - Primary    Not under good control. Will add abilify and recheck 1 month. Call with any concerns.       Relevant Orders   CBC with Differential/Platelet   Comprehensive metabolic panel   TSH   Depression    Not under good control. Will add abilify and recheck 1 month. Call with any concerns.       Relevant Orders   CBC with Differential/Platelet   Comprehensive metabolic panel   TSH   Hyperlipidemia    Rechecking levels today. Await results. Call with any concerns. Refills given today.      Relevant Orders   CBC with Differential/Platelet   Comprehensive metabolic panel   Lipid Panel [place], Waived   TSH   Urinary incontinence    Checking urine today. Await results. Call with any concerns.       Relevant Orders   CBC with Differential/Platelet   Comprehensive metabolic panel   TSH   UA/M w/rflx Culture, Routine   Vitamin D deficiency disease    Rechecking levels today. Await results.       Relevant Orders   CBC with Differential/Platelet   Comprehensive metabolic panel   TSH   VITAMIN D 25 Hydroxy (Vit-D Deficiency, Fractures)       Follow up plan: Return in about 4 weeks (around 10/10/2017) for follow up mood.

## 2017-09-12 NOTE — Assessment & Plan Note (Signed)
Rechecking levels today. Await results. Call with any concerns. Refills given today.

## 2017-09-12 NOTE — Assessment & Plan Note (Signed)
Under good control on recheck. Continue current regimen. Continue to monitor. Call with any concerns.  

## 2017-09-12 NOTE — Assessment & Plan Note (Signed)
Not under good control. Will add abilify and recheck 1 month. Call with any concerns.

## 2017-09-12 NOTE — Assessment & Plan Note (Signed)
Would like to see orthopedics for evaluation and shots. Referral generated today.

## 2017-09-12 NOTE — Assessment & Plan Note (Signed)
Rechecking levels today. Await results. Call with any concerns.  

## 2017-09-12 NOTE — Assessment & Plan Note (Signed)
Rechecking levels today. Await results.  

## 2017-09-12 NOTE — Assessment & Plan Note (Signed)
Checking urine today. Await results. Call with any concerns.  

## 2017-09-13 ENCOUNTER — Other Ambulatory Visit: Payer: Self-pay | Admitting: Family Medicine

## 2017-09-13 LAB — MICROSCOPIC EXAMINATION

## 2017-09-13 LAB — UA/M W/RFLX CULTURE, ROUTINE
Bilirubin, UA: NEGATIVE
Glucose, UA: NEGATIVE
KETONES UA: NEGATIVE
LEUKOCYTES UA: NEGATIVE
Nitrite, UA: NEGATIVE
SPEC GRAV UA: 1.025 (ref 1.005–1.030)
Urobilinogen, Ur: 1 mg/dL (ref 0.2–1.0)
pH, UA: 5.5 (ref 5.0–7.5)

## 2017-09-13 LAB — MICROALBUMIN, URINE WAIVED
CREATININE, URINE WAIVED: 200 mg/dL (ref 10–300)
MICROALB, UR WAIVED: 80 mg/L — AB (ref 0–19)

## 2017-09-13 MED ORDER — VITAMIN D (ERGOCALCIFEROL) 1.25 MG (50000 UNIT) PO CAPS: 50000.0000 [IU] | ORAL_CAPSULE | ORAL | 0 refills | Status: DC

## 2017-09-15 LAB — CBC WITH DIFFERENTIAL/PLATELET
BASOS ABS: 0 10*3/uL (ref 0.0–0.2)
Basos: 0 %
EOS (ABSOLUTE): 0.3 10*3/uL (ref 0.0–0.4)
Eos: 3 %
Hematocrit: 41.8 % (ref 34.0–46.6)
Hemoglobin: 14 g/dL (ref 11.1–15.9)
IMMATURE GRANULOCYTES: 1 %
Immature Grans (Abs): 0.1 10*3/uL (ref 0.0–0.1)
LYMPHS ABS: 2.3 10*3/uL (ref 0.7–3.1)
Lymphs: 23 %
MCH: 31.6 pg (ref 26.6–33.0)
MCHC: 33.5 g/dL (ref 31.5–35.7)
MCV: 94 fL (ref 79–97)
MONOS ABS: 0.8 10*3/uL (ref 0.1–0.9)
Monocytes: 7 %
NEUTROS PCT: 66 %
Neutrophils Absolute: 6.8 10*3/uL (ref 1.4–7.0)
PLATELETS: 255 10*3/uL (ref 150–379)
RBC: 4.43 x10E6/uL (ref 3.77–5.28)
RDW: 13.3 % (ref 12.3–15.4)
WBC: 10.1 10*3/uL (ref 3.4–10.8)

## 2017-09-15 LAB — COMPREHENSIVE METABOLIC PANEL
ALK PHOS: 72 IU/L (ref 39–117)
ALT: 18 IU/L (ref 0–32)
AST: 17 IU/L (ref 0–40)
Albumin/Globulin Ratio: 1.9 (ref 1.2–2.2)
Albumin: 4.5 g/dL (ref 3.5–4.8)
BUN/Creatinine Ratio: 20 (ref 12–28)
BUN: 19 mg/dL (ref 8–27)
Bilirubin Total: 0.2 mg/dL (ref 0.0–1.2)
CO2: 23 mmol/L (ref 20–29)
CREATININE: 0.95 mg/dL (ref 0.57–1.00)
Calcium: 9.4 mg/dL (ref 8.7–10.3)
Chloride: 105 mmol/L (ref 96–106)
GFR calc Af Amer: 68 mL/min/{1.73_m2} (ref >59)
GFR calc non Af Amer: 59 mL/min/{1.73_m2} — ABNORMAL LOW (ref >59)
GLUCOSE: 107 mg/dL — AB (ref 65–99)
Globulin, Total: 2.4 g/dL (ref 1.5–4.5)
Potassium: 4.1 mmol/L (ref 3.5–5.2)
Sodium: 143 mmol/L (ref 134–144)
Total Protein: 6.9 g/dL (ref 6.0–8.5)

## 2017-09-15 LAB — VITAMIN D 25 HYDROXY (VIT D DEFICIENCY, FRACTURES): Vit D, 25-Hydroxy: 17.5 ng/mL — ABNORMAL LOW (ref 30.0–100.0)

## 2017-09-15 LAB — TSH: TSH: 1.67 u[IU]/mL (ref 0.450–4.500)

## 2017-09-22 ENCOUNTER — Other Ambulatory Visit: Payer: Self-pay | Admitting: Family Medicine

## 2017-10-12 ENCOUNTER — Encounter: Payer: Self-pay | Admitting: Family Medicine

## 2017-10-12 ENCOUNTER — Ambulatory Visit (INDEPENDENT_AMBULATORY_CARE_PROVIDER_SITE_OTHER): Payer: Medicare Other | Admitting: Family Medicine

## 2017-10-12 VITALS — BP 135/83 | HR 72 | Temp 97.7°F | Wt 144.2 lb

## 2017-10-12 DIAGNOSIS — F331 Major depressive disorder, recurrent, moderate: Secondary | ICD-10-CM

## 2017-10-12 DIAGNOSIS — F419 Anxiety disorder, unspecified: Secondary | ICD-10-CM

## 2017-10-12 DIAGNOSIS — M4726 Other spondylosis with radiculopathy, lumbar region: Secondary | ICD-10-CM

## 2017-10-12 DIAGNOSIS — M5136 Other intervertebral disc degeneration, lumbar region: Secondary | ICD-10-CM

## 2017-10-12 MED ORDER — LOSARTAN POTASSIUM 50 MG PO TABS: 50.0000 mg | ORAL_TABLET | Freq: Every day | ORAL | 3 refills | Status: DC

## 2017-10-12 MED ORDER — VITAMIN D (ERGOCALCIFEROL) 1.25 MG (50000 UNIT) PO CAPS: 50000.0000 [IU] | ORAL_CAPSULE | ORAL | 0 refills | Status: DC

## 2017-10-12 MED ORDER — ARIPIPRAZOLE 5 MG PO TABS: 5.0000 mg | ORAL_TABLET | Freq: Every day | ORAL | 1 refills | Status: DC

## 2017-10-12 MED ORDER — NAPROXEN 500 MG PO TABS: ORAL_TABLET | ORAL | 1 refills | Status: DC

## 2017-10-12 MED ORDER — RANITIDINE HCL 150 MG PO TABS: 300.0000 mg | ORAL_TABLET | Freq: Every evening | ORAL | 0 refills | Status: DC

## 2017-10-12 MED ORDER — ATORVASTATIN CALCIUM 40 MG PO TABS: 40.0000 mg | ORAL_TABLET | Freq: Every day | ORAL | 3 refills | Status: DC

## 2017-10-12 MED ORDER — AMLODIPINE BESYLATE 10 MG PO TABS: 10.0000 mg | ORAL_TABLET | Freq: Every day | ORAL | 3 refills | Status: DC

## 2017-10-12 MED ORDER — FLUTICASONE PROPIONATE 50 MCG/ACT NA SUSP: 2.0000 | Freq: Every day | NASAL | 11 refills | Status: DC

## 2017-10-12 MED ORDER — BUSPIRONE HCL 15 MG PO TABS: 15.0000 mg | ORAL_TABLET | Freq: Three times a day (TID) | ORAL | 3 refills | Status: DC

## 2017-10-12 MED ORDER — LORATADINE 10 MG PO TABS: 10.0000 mg | ORAL_TABLET | Freq: Every day | ORAL | 11 refills | Status: DC

## 2017-10-12 MED ORDER — OMEPRAZOLE 20 MG PO CPDR: 20.0000 mg | DELAYED_RELEASE_CAPSULE | Freq: Every day | ORAL | 0 refills | Status: DC

## 2017-10-12 MED ORDER — TRAMADOL HCL 50 MG PO TABS: 50.0000 mg | ORAL_TABLET | Freq: Three times a day (TID) | ORAL | 0 refills | Status: DC | PRN

## 2017-10-12 NOTE — Progress Notes (Signed)
BP 135/83 (BP Location: Left Arm, Patient Position: Sitting, Cuff Size: Normal)   Pulse 72   Temp 97.7 F (36.5 C) (Oral)   Wt 144 lb 3 oz (65.4 kg)   LMP  (LMP Unknown)   SpO2 96%   BMI 28.16 kg/m    Subjective:    Patient ID: Cynthia Vaughan, female    DOB: Feb 28, 1941, 77 y.o.   MRN: [id number]  HPI: Cynthia Vaughan is a 77 y.o. female  Chief Complaint  Patient presents with  . Anxiety  . R hip pain   Having trouble walking. In severe pain. Got a injection in her trochanteric bursa previously, but that only helped for about 2 weeks. Had x-ray done which showed no sign of any arthritis in her hip, but quite a bit of DJD. She notes that she is feeling tight and sore. Having some weakness. Trouble walking. No falls. No issues with numbness or tingling. Not feeling good at all.   ANXIETY/DEPRESSION Duration:better Anxious mood: yes  Excessive worrying: yes Irritability: yes  Sweating: no Nausea: no Palpitations:no Hyperventilation: no Panic attacks: no Agoraphobia: no  Obscessions/compulsions: no Depressed mood: yes Depression screen [facility] 2/9 10/12/2017 09/12/2017 06/05/2017 05/08/2017 04/20/2017  Decreased Interest 1 0 2 2 0  Down, Depressed, Hopeless 2 0 2 1 0  PHQ - 2 Score 3 0 4 3 0  Altered sleeping 1 1 0 1 -  Tired, decreased energy 1 1 3 1  -  Change in appetite 1 0 0 1 -  Feeling bad or failure about yourself  1 0 0 0 -  Trouble concentrating 1 0 3 0 -  Moving slowly or fidgety/restless 0 0 1 0 -  Suicidal thoughts 0 0 0 0 -  PHQ-9 Score 8 2 11 6  -  Difficult doing work/chores Somewhat difficult - - - -   GAD 7 : Generalized Anxiety Score 10/12/2017 10/18/2016 06/21/2016  Nervous, Anxious, on Edge 1 3 2   Control/stop worrying 1 3 2   Worry too much - different things 1 3 2   Trouble relaxing 2 2 1   Restless 0 0 2  Easily annoyed or irritable 1 1 1   Afraid - awful might happen 0 0 1  Total GAD 7 Score 6 12 11   Anxiety Difficulty Somewhat difficult Somewhat  difficult Somewhat difficult   Anhedonia: no Weight changes: no Insomnia: no   Hypersomnia: no Fatigue/loss of energy: yes Feelings of worthlessness: yes Feelings of guilt: yes Impaired concentration/indecisiveness: yes Suicidal ideations: no  Crying spells: yes Recent Stressors/Life Changes: no  Relevant past medical, surgical, family and social history reviewed and updated as indicated. Interim medical history since our last visit reviewed. Allergies and medications reviewed and updated.  Review of Systems  Respiratory: Negative.   Cardiovascular: Negative.   Musculoskeletal: Positive for arthralgias, back pain and myalgias. Negative for gait problem, joint swelling, neck pain and neck stiffness.  Skin: Negative.   Neurological: Positive for weakness and numbness. Negative for dizziness, tremors, seizures, syncope, facial asymmetry, speech difficulty, light-headedness and headaches.  Psychiatric/Behavioral: Negative.     Per HPI unless specifically indicated above     Objective:    BP 135/83 (BP Location: Left Arm, Patient Position: Sitting, Cuff Size: Normal)   Pulse 72   Temp 97.7 F (36.5 C) (Oral)   Wt 144 lb 3 oz (65.4 kg)   LMP  (LMP Unknown)   SpO2 96%   BMI 28.16 kg/m   Wt Readings from Last 3 Encounters:  10/12/17 144 lb 3 oz (65.4 kg)  09/12/17 146 lb 9.6 oz (66.5 kg)  08/30/17 150 lb (68 kg)    Physical Exam  Constitutional: She is oriented to person, place, and time. She appears well-developed and well-nourished. No distress.  HENT:  Head: Normocephalic and atraumatic.  Right Ear: Hearing normal.  Left Ear: Hearing normal.  Nose: Nose normal.  Eyes: Conjunctivae and lids are normal. Right eye exhibits no discharge. Left eye exhibits no discharge. No scleral icterus.  Cardiovascular: Normal rate, regular rhythm, normal heart sounds and intact distal pulses. Exam reveals no gallop and no friction rub.  No murmur heard. Pulmonary/Chest: Effort normal  and breath sounds normal. No respiratory distress. She has no wheezes. She has no rales. She exhibits no tenderness.  Neurological: She is alert and oriented to person, place, and time.  Skin: Skin is warm, dry and intact. No rash noted. She is not diaphoretic. No erythema. No pallor.  Psychiatric: She has a normal mood and affect. Her speech is normal and behavior is normal. Judgment and thought content normal. Cognition and memory are normal.  Nursing note and vitals reviewed. Back Exam:    Inspection:  Normal spinal curvature.  No deformity, ecchymosis, erythema, or lesions     Palpation:     Midline spinal tenderness: no      Paralumbar tenderness: yes Right     Parathoracic tenderness: no      Buttocks tenderness: yesRight     Range of Motion:      Flexion: Fingers to Knees     Extension:Decreased     Lateral bending:Decreased    Rotation:Decreased    Neuro Exam:decreased sensation R leg, 4/5 strength R leg, otherwise normal    Special Tests:      Straight leg raise:negative  Results for orders placed or performed in visit on 09/12/17  Microscopic Examination  Result Value Ref Range   WBC, UA 0-5 0 - 5 /hpf   RBC, UA 0-2 0 - 2 /hpf   Epithelial Cells (non renal) >10 (A) 0 - 10 /hpf   Renal Epithel, UA 0-10 (A) None seen /hpf   Bacteria, UA Few None seen/Few  CBC with Differential/Platelet  Result Value Ref Range   WBC 10.1 3.4 - 10.8 x10E3/uL   RBC 4.43 3.77 - 5.28 x10E6/uL   Hemoglobin 14.0 11.1 - 15.9 g/dL   Hematocrit 41.8 34.0 - 46.6 %   MCV 94 79 - 97 fL   MCH 31.6 26.6 - 33.0 pg   MCHC 33.5 31.5 - 35.7 g/dL   RDW 13.3 12.3 - 15.4 %   Platelets 255 150 - 379 x10E3/uL   Neutrophils 66 Not Estab. %   Lymphs 23 Not Estab. %   Monocytes 7 Not Estab. %   Eos 3 Not Estab. %   Basos 0 Not Estab. %   Neutrophils Absolute 6.8 1.4 - 7.0 x10E3/uL   Lymphocytes Absolute 2.3 0.7 - 3.1 x10E3/uL   Monocytes Absolute 0.8 0.1 - 0.9 x10E3/uL   EOS (ABSOLUTE) 0.3 0.0 - 0.4  x10E3/uL   Basophils Absolute 0.0 0.0 - 0.2 x10E3/uL   Immature Granulocytes 1 Not Estab. %   Immature Grans (Abs) 0.1 0.0 - 0.1 x10E3/uL  Comprehensive metabolic panel  Result Value Ref Range   Glucose 107 (H) 65 - 99 mg/dL   BUN 19 8 - 27 mg/dL   Creatinine, Ser 0.95 0.57 - 1.00 mg/dL   GFR calc non Af Amer 59 (L) >59 mL/min/1.73  GFR calc Af Amer 68 >59 mL/min/1.73   BUN/Creatinine Ratio 20 12 - 28   Sodium 143 134 - 144 mmol/L   Potassium 4.1 3.5 - 5.2 mmol/L   Chloride 105 96 - 106 mmol/L   CO2 23 20 - 29 mmol/L   Calcium 9.4 8.7 - 10.3 mg/dL   Total Protein 6.9 6.0 - 8.5 g/dL   Albumin 4.5 3.5 - 4.8 g/dL   Globulin, Total 2.4 1.5 - 4.5 g/dL   Albumin/Globulin Ratio 1.9 1.2 - 2.2   Bilirubin Total 0.2 0.0 - 1.2 mg/dL   Alkaline Phosphatase 72 39 - 117 IU/L   AST 17 0 - 40 IU/L   ALT 18 0 - 32 IU/L  Microalbumin, Urine Waived  Result Value Ref Range   Microalb, Ur Waived 80 (H) 0 - 19 mg/L   Creatinine, Urine Waived 200 10 - 300 mg/dL   Microalb/Creat Ratio 30-300 (H) <30 mg/g  TSH  Result Value Ref Range   TSH 1.670 0.450 - 4.500 uIU/mL  UA/M w/rflx Culture, Routine  Result Value Ref Range   Specific Gravity, UA 1.025 1.005 - 1.030   pH, UA 5.5 5.0 - 7.5   Color, UA Yellow Yellow   Appearance Ur Cloudy (A) Clear   Leukocytes, UA Negative Negative   Protein, UA Trace (A) Negative/Trace   Glucose, UA Negative Negative   Ketones, UA Negative Negative   RBC, UA Trace (A) Negative   Bilirubin, UA Negative Negative   Urobilinogen, Ur 1.0 0.2 - 1.0 mg/dL   Nitrite, UA Negative Negative   Microscopic Examination See below:   VITAMIN D 25 Hydroxy (Vit-D Deficiency, Fractures)  Result Value Ref Range   Vit D, 25-Hydroxy 17.5 (L) 30.0 - 100.0 ng/mL      Assessment & Plan:   Problem List Items Addressed This Visit      Musculoskeletal and Integument   Osteoarthritis - Primary   Relevant Medications   traMADol (ULTRAM) 50 MG tablet   naproxen (NAPROSYN) 500 MG  tablet   Other Relevant Orders   Ambulatory referral to Neurosurgery     Other   Anxiety    Doing well on current regimen. Would like to continue current regimen. Continue to monitor. Call with any concerns.       Relevant Medications   busPIRone (BUSPAR) 15 MG tablet   Depression    Doing well on current regimen. Would like to continue current regimen. Continue to monitor. Call with any concerns.       Relevant Medications   busPIRone (BUSPAR) 15 MG tablet    Other Visit Diagnoses    Other intervertebral disc degeneration, lumbar region       Relevant Medications   traMADol (ULTRAM) 50 MG tablet   naproxen (NAPROSYN) 500 MG tablet   Other Relevant Orders   MR Lumbar Spine Wo Contrast       Follow up plan: Return in about 4 weeks (around 11/09/2017) for If hasn't seen neurosurgery.

## 2017-10-12 NOTE — Assessment & Plan Note (Signed)
Doing well on current regimen. Would like to continue current regimen. Continue to monitor. Call with any concerns.

## 2017-10-16 ENCOUNTER — Telehealth: Payer: Self-pay | Admitting: Family Medicine

## 2017-10-16 NOTE — Telephone Encounter (Signed)
I will send this [facility].  This got entered as routine on 10/12/2017 and I was not here.  I have not worked on referrals today as too full [place]/d with Apolonio Schneiders and phone calls. I will work on referrals ASAP and make her my top priority. Please allow her at least to Wednesday. The referral will be sent by then and hopefully processed.

## 2017-10-16 NOTE — Telephone Encounter (Signed)
Patient would like a shot for the pain in her leg because the pills do not last longer than an hour.  [phone]  Please advise  Thanks

## 2017-10-16 NOTE — Telephone Encounter (Signed)
Keri: Who did you send this patient to?

## 2017-10-16 NOTE — Telephone Encounter (Signed)
Patient notified

## 2017-10-16 NOTE — Telephone Encounter (Signed)
We have put in a referral for her, please give her the number and she can call to expedite things.

## 2017-10-23 NOTE — Telephone Encounter (Signed)
Received fax from [facility] stating, "Called pt to set up appt, she states that she is seeing dr for injection this coming week. Does she still need to be seen? Referral is inactive and she is going to call next week because she is confused."

## 2017-10-23 NOTE — Telephone Encounter (Signed)
Patient notified

## 2017-10-23 NOTE — Telephone Encounter (Signed)
She has an appointment on Thursday for her MRI- not for an injection. I would like her to see neurosurgery please.

## 2017-10-26 ENCOUNTER — Ambulatory Visit
Admission: RE | Admit: 2017-10-26 | Discharge: 2017-10-26 | Disposition: A | Discharge status: Home / Self Care | Payer: Medicare Other | Source: Ambulatory Visit | Attending: Family Medicine | Admitting: Family Medicine

## 2017-10-26 DIAGNOSIS — M4316 Spondylolisthesis, lumbar region: Secondary | ICD-10-CM | POA: Diagnosis not present

## 2017-10-26 DIAGNOSIS — M5136 Other intervertebral disc degeneration, lumbar region: Secondary | ICD-10-CM | POA: Insufficient documentation

## 2017-10-26 DIAGNOSIS — M47816 Spondylosis without myelopathy or radiculopathy, lumbar region: Secondary | ICD-10-CM | POA: Insufficient documentation

## 2017-10-26 DIAGNOSIS — M48061 Spinal stenosis, lumbar region without neurogenic claudication: Secondary | ICD-10-CM | POA: Diagnosis not present

## 2017-10-27 ENCOUNTER — Other Ambulatory Visit: Payer: Self-pay | Admitting: Family Medicine

## 2017-10-27 ENCOUNTER — Telehealth: Payer: Self-pay | Admitting: Family Medicine

## 2017-10-27 DIAGNOSIS — M4726 Other spondylosis with radiculopathy, lumbar region: Secondary | ICD-10-CM

## 2017-10-27 DIAGNOSIS — M47816 Spondylosis without myelopathy or radiculopathy, lumbar region: Secondary | ICD-10-CM | POA: Insufficient documentation

## 2017-10-27 DIAGNOSIS — M48061 Spinal stenosis, lumbar region without neurogenic claudication: Secondary | ICD-10-CM

## 2017-10-27 NOTE — Telephone Encounter (Signed)
Pleas let her know that her MRI shows a lot of arthritis and some of it is pushing on a nerve. Please make sure she's seeing the neurosurgeon, because that's the next step. Thanks!

## 2017-10-31 ENCOUNTER — Telehealth: Payer: Self-pay | Admitting: Family Medicine

## 2017-10-31 NOTE — Telephone Encounter (Signed)
Routing to provider  

## 2017-10-31 NOTE — Telephone Encounter (Signed)
Copied from [place]. Topic: Quick Communication - See Telephone Encounter >> Oct 31, 2017  3:47 PM Ether Griffins B wrote: CRM for notification. See Telephone encounter for:  Pt wanting to know what is the next step for her pain now that MRI is done and she knows its arthritis.  10/31/17.

## 2017-10-31 NOTE — Telephone Encounter (Signed)
Needs to see [place] [facility]- should have an appointment.

## 2017-11-01 NOTE — Telephone Encounter (Signed)
Called and spoke to patient. Patient states that she has not heard anything from [facility].   [facility] and they state that the patient's referral is still being processed. She states that the doctor has to review the referral and then they will contact the patient to let her know this. Will call patient back and let her know.

## 2017-11-01 NOTE — Telephone Encounter (Signed)
Patient notified that referral is in process with [facility].

## 2017-11-08 ENCOUNTER — Other Ambulatory Visit: Payer: Self-pay | Admitting: Family Medicine

## 2017-11-09 ENCOUNTER — Other Ambulatory Visit: Payer: Self-pay | Admitting: Family Medicine

## 2017-11-09 ENCOUNTER — Ambulatory Visit (INDEPENDENT_AMBULATORY_CARE_PROVIDER_SITE_OTHER): Payer: Medicare Other | Admitting: Family Medicine

## 2017-11-09 ENCOUNTER — Telehealth: Payer: Self-pay

## 2017-11-09 ENCOUNTER — Encounter: Payer: Self-pay | Admitting: Family Medicine

## 2017-11-09 VITALS — BP 114/77 | HR 63 | Wt 141.3 lb

## 2017-11-09 DIAGNOSIS — M4726 Other spondylosis with radiculopathy, lumbar region: Secondary | ICD-10-CM | POA: Diagnosis not present

## 2017-11-09 DIAGNOSIS — F331 Major depressive disorder, recurrent, moderate: Secondary | ICD-10-CM | POA: Diagnosis not present

## 2017-11-09 MED ORDER — SERTRALINE HCL 100 MG PO TABS: 200.0000 mg | ORAL_TABLET | Freq: Every day | ORAL | 1 refills | Status: DC

## 2017-11-09 MED ORDER — TRAMADOL HCL 50 MG PO TABS: 50.0000 mg | ORAL_TABLET | Freq: Three times a day (TID) | ORAL | 0 refills | Status: DC | PRN

## 2017-11-09 NOTE — Progress Notes (Signed)
BP 114/77 (BP Location: Left Arm, Patient Position: Sitting, Cuff Size: Normal)   Pulse 63   Wt 141 lb 5 oz (64.1 kg)   LMP  (LMP Unknown)   SpO2 95%   BMI 27.60 kg/m    Subjective:    Patient ID: [name], female    DOB: 10-Mar-1941, 77 y.o.   MRN: [id number]  HPI: SHERESE Vaughan is a 77 y.o. female  Chief Complaint  Patient presents with  . Depression  . Back Pain   Is very upset today. She feels like no one cares about her. She feels like no one is listening to her. She is upset as she has not heard anything from the neurosurgeon. Thought she had an appointment with them, but apparently did not. Still trying to figure out her referral. Pain is about a 2/10 right now. Pain medicine is helping, but she doesn't want to keep taking it.   DEPRESSION Mood status: stable Satisfied with current treatment?: yes Symptom severity: mild  Duration of current treatment : chronic Side effects: no Medication compliance: excellent compliance Psychotherapy/counseling: no  Previous psychiatric medications: abilify, buspar, zoloft Depressed mood: yes Anxious mood: yes Anhedonia: no Significant weight loss or gain: no Insomnia: no  Fatigue: yes Feelings of worthlessness or guilt: no Impaired concentration/indecisiveness: no Suicidal ideations: no Hopelessness: yes Crying spells: yes Depression screen [facility] 2/9 11/09/2017 10/12/2017 09/12/2017 06/05/2017 05/08/2017  Decreased Interest 1 1 0 2 2  Down, Depressed, Hopeless 0 2 0 2 1  PHQ - 2 Score 1 3 0 4 3  Altered sleeping 0 1 1 0 1  Tired, decreased energy 1 1 1 3 1   Change in appetite 1 1 0 0 1  Feeling bad or failure about yourself  0 1 0 0 0  Trouble concentrating 0 1 0 3 0  Moving slowly or fidgety/restless 0 0 0 1 0  Suicidal thoughts 0 0 0 0 0  PHQ-9 Score 3 8 2 11 6   Difficult doing work/chores Somewhat difficult Somewhat difficult - - -   GAD 7 : Generalized Anxiety Score 11/09/2017 10/12/2017 10/18/2016 06/21/2016    Nervous, Anxious, on Edge 1 1 3 2   Control/stop worrying 1 1 3 2   Worry too much - different things 1 1 3 2   Trouble relaxing 1 2 2 1   Restless 0 0 0 2  Easily annoyed or irritable 0 1 1 1   Afraid - awful might happen 0 0 0 1  Total GAD 7 Score 4 6 12 11   Anxiety Difficulty Somewhat difficult Somewhat difficult Somewhat difficult Somewhat difficult    Relevant past medical, surgical, family and social history reviewed and updated as indicated. Interim medical history since our last visit reviewed. Allergies and medications reviewed and updated.  Review of Systems  Constitutional: Negative.   Respiratory: Negative.   Cardiovascular: Negative.   Musculoskeletal: Positive for arthralgias, back pain, gait problem and myalgias. Negative for joint swelling, neck pain and neck stiffness.  Skin: Negative.   Neurological: Negative for dizziness, tremors, seizures, syncope, facial asymmetry, speech difficulty, weakness, light-headedness, numbness and headaches.  Psychiatric/Behavioral: Negative.     Per HPI unless specifically indicated above     Objective:    BP 114/77 (BP Location: Left Arm, Patient Position: Sitting, Cuff Size: Normal)   Pulse 63   Wt 141 lb 5 oz (64.1 kg)   LMP  (LMP Unknown)   SpO2 95%   BMI 27.60 kg/m   Wt Readings from  Last 3 Encounters:  11/09/17 141 lb 5 oz (64.1 kg)  10/12/17 144 lb 3 oz (65.4 kg)  09/12/17 146 lb 9.6 oz (66.5 kg)    Physical Exam  Constitutional: She is oriented to person, place, and time. She appears well-developed and well-nourished. No distress.  HENT:  Head: Normocephalic and atraumatic.  Right Ear: Hearing normal.  Left Ear: Hearing normal.  Nose: Nose normal.  Eyes: Conjunctivae and lids are normal. Right eye exhibits no discharge. Left eye exhibits no discharge. No scleral icterus.  Cardiovascular: Normal rate, regular rhythm, normal heart sounds and intact distal pulses. Exam reveals no gallop and no friction rub.  No  murmur heard. Pulmonary/Chest: Effort normal and breath sounds normal. No respiratory distress. She has no wheezes. She has no rales. She exhibits no tenderness.  Musculoskeletal: Normal range of motion.  Neurological: She is alert and oriented to person, place, and time.  Skin: Skin is warm, dry and intact. No rash noted. She is not diaphoretic. No erythema. No pallor.  Psychiatric: Her speech is normal and behavior is normal. Judgment and thought content normal. Her affect is angry. Cognition and memory are normal.  Nursing note and vitals reviewed.   Results for orders placed or performed in visit on 09/12/17  Microscopic Examination  Result Value Ref Range   WBC, UA 0-5 0 - 5 /hpf   RBC, UA 0-2 0 - 2 /hpf   Epithelial Cells (non renal) >10 (A) 0 - 10 /hpf   Renal Epithel, UA 0-10 (A) None seen /hpf   Bacteria, UA Few None seen/Few  CBC with Differential/Platelet  Result Value Ref Range   WBC 10.1 3.4 - 10.8 x10E3/uL   RBC 4.43 3.77 - 5.28 x10E6/uL   Hemoglobin 14.0 11.1 - 15.9 g/dL   Hematocrit 41.8 34.0 - 46.6 %   MCV 94 79 - 97 fL   MCH 31.6 26.6 - 33.0 pg   MCHC 33.5 31.5 - 35.7 g/dL   RDW 13.3 12.3 - 15.4 %   Platelets 255 150 - 379 x10E3/uL   Neutrophils 66 Not Estab. %   Lymphs 23 Not Estab. %   Monocytes 7 Not Estab. %   Eos 3 Not Estab. %   Basos 0 Not Estab. %   Neutrophils Absolute 6.8 1.4 - 7.0 x10E3/uL   Lymphocytes Absolute 2.3 0.7 - 3.1 x10E3/uL   Monocytes Absolute 0.8 0.1 - 0.9 x10E3/uL   EOS (ABSOLUTE) 0.3 0.0 - 0.4 x10E3/uL   Basophils Absolute 0.0 0.0 - 0.2 x10E3/uL   Immature Granulocytes 1 Not Estab. %   Immature Grans (Abs) 0.1 0.0 - 0.1 x10E3/uL  Comprehensive metabolic panel  Result Value Ref Range   Glucose 107 (H) 65 - 99 mg/dL   BUN 19 8 - 27 mg/dL   Creatinine, Ser 0.95 0.57 - 1.00 mg/dL   GFR calc non Af Amer 59 (L) >59 mL/min/1.73   GFR calc Af Amer 68 >59 mL/min/1.73   BUN/Creatinine Ratio 20 12 - 28   Sodium 143 134 - 144 mmol/L    Potassium 4.1 3.5 - 5.2 mmol/L   Chloride 105 96 - 106 mmol/L   CO2 23 20 - 29 mmol/L   Calcium 9.4 8.7 - 10.3 mg/dL   Total Protein 6.9 6.0 - 8.5 g/dL   Albumin 4.5 3.5 - 4.8 g/dL   Globulin, Total 2.4 1.5 - 4.5 g/dL   Albumin/Globulin Ratio 1.9 1.2 - 2.2   Bilirubin Total 0.2 0.0 - 1.2 mg/dL  Alkaline Phosphatase 72 39 - 117 IU/L   AST 17 0 - 40 IU/L   ALT 18 0 - 32 IU/L  Microalbumin, Urine Waived  Result Value Ref Range   Microalb, Ur Waived 80 (H) 0 - 19 mg/L   Creatinine, Urine Waived 200 10 - 300 mg/dL   Microalb/Creat Ratio 30-300 (H) <30 mg/g  TSH  Result Value Ref Range   TSH 1.670 0.450 - 4.500 uIU/mL  UA/M w/rflx Culture, Routine  Result Value Ref Range   Specific Gravity, UA 1.025 1.005 - 1.030   pH, UA 5.5 5.0 - 7.5   Color, UA Yellow Yellow   Appearance Ur Cloudy (A) Clear   Leukocytes, UA Negative Negative   Protein, UA Trace (A) Negative/Trace   Glucose, UA Negative Negative   Ketones, UA Negative Negative   RBC, UA Trace (A) Negative   Bilirubin, UA Negative Negative   Urobilinogen, Ur 1.0 0.2 - 1.0 mg/dL   Nitrite, UA Negative Negative   Microscopic Examination See below:   VITAMIN D 25 Hydroxy (Vit-D Deficiency, Fractures)  Result Value Ref Range   Vit D, 25-Hydroxy 17.5 (L) 30.0 - 100.0 ng/mL      Assessment & Plan:   Problem List Items Addressed This Visit      Musculoskeletal and Integument   Degenerative joint disease (DJD) of lumbar spine    Referral to neurosurgeon is pending- call out to them today, awaiting a call back. Will continue tramadol for now. If we have not heard from neurosurgeon in the next week, we will refer her to pain management to see if they can get her an injection. Discussed with patient today, acknowledged that she is feeling frustrated.       Relevant Medications   traMADol (ULTRAM) 50 MG tablet     Other   Depression - Primary    Doing a bit better. Continue current regimen. Continue to monitor. Call with any  concerns.       Relevant Medications   sertraline (ZOLOFT) 100 MG tablet       Follow up plan: Return in about 1 month (around 12/07/2017) for follow up back pain.

## 2017-11-09 NOTE — Assessment & Plan Note (Signed)
Doing a bit better. Continue current regimen. Continue to monitor. Call with any concerns.

## 2017-11-09 NOTE — Telephone Encounter (Signed)
Please let patient know that they DID reach out to her and she refused the appointment. Please let her know that if she would like to see pain management, I'd be happy to get her into see them, or we can get her into see neurosurgery.

## 2017-11-09 NOTE — Assessment & Plan Note (Signed)
Referral to neurosurgeon is pending- call out to them today, awaiting a call back. Will continue tramadol for now. If we have not heard from neurosurgeon in the next week, we will refer her to pain management to see if they can get her an injection. Discussed with patient today, acknowledged that she is feeling frustrated.

## 2017-11-09 NOTE — Telephone Encounter (Signed)
Appointment scheduled for 11/23/17 @ 1:30pm Dr.Pool  Patient to arrive at 1:15 for check in, bring paperwork and insurance card.  Patient notified.

## 2017-11-09 NOTE — Telephone Encounter (Signed)
Spoke with [facility], patient refused the appointment, so the referral is inactive, left a message with new patient referral coordinator to check to see if a new referral needs to be sent or what we need to do to get the patient scheduled.

## 2017-11-23 DIAGNOSIS — M5136 Other intervertebral disc degeneration, lumbar region: Secondary | ICD-10-CM | POA: Diagnosis not present

## 2017-11-23 DIAGNOSIS — I10 Essential (primary) hypertension: Secondary | ICD-10-CM | POA: Diagnosis not present

## 2017-11-23 DIAGNOSIS — Z6827 Body mass index (BMI) 27.0-27.9, adult: Secondary | ICD-10-CM | POA: Diagnosis not present

## 2017-11-28 ENCOUNTER — Telehealth: Payer: Self-pay | Admitting: Family Medicine

## 2017-11-28 MED ORDER — ATORVASTATIN CALCIUM 40 MG PO TABS: 40.0000 mg | ORAL_TABLET | Freq: Every day | ORAL | 3 refills | Status: DC

## 2017-11-28 MED ORDER — FLUTICASONE PROPIONATE 50 MCG/ACT NA SUSP: 2.0000 | Freq: Every day | NASAL | 3 refills | Status: DC

## 2017-11-28 MED ORDER — OMEPRAZOLE 20 MG PO CPDR: 20.0000 mg | DELAYED_RELEASE_CAPSULE | Freq: Every day | ORAL | 0 refills | Status: DC

## 2017-11-28 MED ORDER — VITAMIN D-3 25 MCG (1000 UT) PO CAPS: 1.0000 | ORAL_CAPSULE | Freq: Every day | ORAL | 3 refills | Status: DC

## 2017-11-28 MED ORDER — BUSPIRONE HCL 15 MG PO TABS: 15.0000 mg | ORAL_TABLET | Freq: Three times a day (TID) | ORAL | 3 refills | Status: DC

## 2017-11-28 MED ORDER — LOSARTAN POTASSIUM 50 MG PO TABS: 50.0000 mg | ORAL_TABLET | Freq: Every day | ORAL | 3 refills | Status: DC

## 2017-11-28 MED ORDER — RANITIDINE HCL 150 MG PO TABS: 300.0000 mg | ORAL_TABLET | Freq: Every evening | ORAL | 3 refills | Status: DC

## 2017-11-28 MED ORDER — AMLODIPINE BESYLATE 10 MG PO TABS: 10.0000 mg | ORAL_TABLET | Freq: Every day | ORAL | 3 refills | Status: DC

## 2017-11-28 MED ORDER — SERTRALINE HCL 100 MG PO TABS: 200.0000 mg | ORAL_TABLET | Freq: Every day | ORAL | 3 refills | Status: DC

## 2017-11-28 MED ORDER — LORATADINE 10 MG PO TABS: 10.0000 mg | ORAL_TABLET | Freq: Every day | ORAL | 3 refills | Status: DC

## 2017-11-28 MED ORDER — ARIPIPRAZOLE 5 MG PO TABS: 5.0000 mg | ORAL_TABLET | Freq: Every day | ORAL | 3 refills | Status: DC

## 2017-11-28 MED ORDER — NAPROXEN 500 MG PO TABS: ORAL_TABLET | ORAL | 3 refills | Status: DC

## 2017-11-28 NOTE — Telephone Encounter (Signed)
Needs refills sent to pill pack

## 2017-11-29 DIAGNOSIS — M5136 Other intervertebral disc degeneration, lumbar region: Secondary | ICD-10-CM | POA: Diagnosis not present

## 2017-12-01 ENCOUNTER — Other Ambulatory Visit: Payer: Self-pay | Admitting: Family Medicine

## 2017-12-04 ENCOUNTER — Other Ambulatory Visit: Payer: Self-pay | Admitting: Family Medicine

## 2017-12-04 NOTE — Telephone Encounter (Signed)
Rx sent in at the beginning of the month with 3 refills- she is supposed to be using pill pack.. Can we check to see if she still wants to use pill pack

## 2017-12-04 NOTE — Telephone Encounter (Signed)
Your patient.  Thanks 

## 2017-12-05 NOTE — Telephone Encounter (Signed)
Attempted to reach, phone answered and disconnected as soon as I requested to speak to patient.

## 2017-12-06 ENCOUNTER — Telehealth: Payer: Self-pay | Admitting: Internal Medicine

## 2017-12-06 NOTE — Telephone Encounter (Signed)
Copied from [place] [id number]. Topic: Quick Communication - See Telephone Encounter >> Dec 06, 2017  2:05 PM Bea Graff, NT wrote: CRM for notification. See Telephone encounter for: 12/06/17. Lennette Bihari with [facility] calling to see if the rx they received for Vitamin D 1000units is suppose to be filled along with the 50,000U? CB#: [phone] Ref#: [id number]

## 2017-12-07 ENCOUNTER — Ambulatory Visit: Payer: Self-pay

## 2017-12-07 DIAGNOSIS — M5136 Other intervertebral disc degeneration, lumbar region: Secondary | ICD-10-CM | POA: Diagnosis not present

## 2017-12-07 NOTE — Telephone Encounter (Signed)
Pharmacy notified.

## 2017-12-07 NOTE — Telephone Encounter (Signed)
Routing to provider. FYI.  

## 2017-12-07 NOTE — Telephone Encounter (Signed)
Just the 50000 for the weeks that she's on it- OK to start the 1000 units when she is done with it.

## 2017-12-07 NOTE — Telephone Encounter (Signed)
Phone call ret'd to pt.  Pt. c/o moderate to severe pain in lower spine, right hip, and right knee.  Stated the pain worsened last night. C/o increased tingling in toes with right > left. Tearful. Stated she saw the Neurosurgeon last week, and he would not give her an injection for the pain. Reported some weakness into right lower extremity. Was advised to go to Physical Therapy; has appt. today, but isn't sure if she can go due to her pain.  Stated she needs something for the pain.  Denied any color change of right LE.  Stated it feels cool at times, and when she uses a heating pad, it feels better.  Able to move the right foot and toes.  Offered appt. 4/19, due to amt. Of pain, and increased numbness/ tingling into right toes. Pt. Agrees with plan.     Message from Arletha Grippe sent at 12/07/2017 9:28 AM EDT   Summary: med care    Pt called, is in pain with her back, and hip. Would like a call back from the nurse as to what to do. Please call [phone]          Reason for Disposition . Numbness in a leg or foot (i.e., loss of sensation)  Answer Assessment - Initial Assessment Questions 1. ONSET: "When did the pain begin?"      Last night it worsened 2. LOCATION: "Where does it hurt?" (upper, mid or lower back)    Base of spine, right hip and right knee 3. SEVERITY: "How bad is the pain?"  (e.g., Scale 1-10; mild, moderate, or severe)   - MILD (1-3): doesn't interfere with normal activities    - MODERATE (4-7): interferes with normal activities or awakens from sleep    - SEVERE (8-10): excruciating pain, unable to do any normal activities      severe 4. PATTERN: "Is the pain constant?" (e.g., yes, no; constant, intermittent)     Comes and goes  5. RADIATION: "Does the pain shoot into your legs or elsewhere?"     Right hip and right knee 6. CAUSE:  "What do you think is causing the back pain?"      Arthritis  7. BACK OVERUSE:  "Any recent lifting of heavy objects, strenuous  work or exercise?"     Denies the above  8. MEDICATIONS: "What have you taken so far for the pain?" (e.g., nothing, acetaminophen, NSAIDS)     Anti-inflammatory 9. NEUROLOGIC SYMPTOMS: "Do you have any weakness, numbness, or problems with bowel/bladder control?"     C/o some numbness, weakness into the right hip and knee and LE 10. OTHER SYMPTOMS: "Do you have any other symptoms?" (e.g., fever, abdominal pain, burning with urination, blood in urine)       Denies other sx's.  Able to move right toes/ foot  11. PREGNANCY: "Is there any chance you are pregnant?" (e.g., yes, no; LMP)       No  Protocols used: BACK PAIN-A-AH

## 2017-12-08 ENCOUNTER — Encounter: Payer: Self-pay | Admitting: Unknown Physician Specialty

## 2017-12-08 ENCOUNTER — Ambulatory Visit (INDEPENDENT_AMBULATORY_CARE_PROVIDER_SITE_OTHER): Payer: Medicare Other | Admitting: Unknown Physician Specialty

## 2017-12-08 DIAGNOSIS — M48061 Spinal stenosis, lumbar region without neurogenic claudication: Secondary | ICD-10-CM

## 2017-12-08 MED ORDER — GABAPENTIN 100 MG PO CAPS: 200.0000 mg | ORAL_CAPSULE | Freq: Three times a day (TID) | ORAL | 1 refills | Status: DC

## 2017-12-08 NOTE — Assessment & Plan Note (Addendum)
Pt willing to continue with PT when she can make it over.  Start Gabapentin 200 mg.  Start with one in the evening before bed.  OK to titrate up to TID.  Consider Cymbalta.

## 2017-12-08 NOTE — Progress Notes (Signed)
BP 126/85 (BP Location: Left Arm, Cuff Size: Normal)   Pulse 95   Temp 97.8 F (36.6 C) (Oral)   Ht 4\' 11"  (1.499 m)   Wt 141 lb 4.8 oz (64.1 kg)   LMP  (LMP Unknown)   SpO2 94%   BMI 28.54 kg/m    Subjective:    Patient ID: Cynthia Vaughan, female    DOB: 1941-08-01, 77 y.o.   MRN: [id number]  HPI: Cynthia Vaughan is a 77 y.o. female  Chief Complaint  Patient presents with  . Pain    pt states she has been having lower back and knee pain, states she went to neurosurgery and they would not give her a shot. Has had an MRI as well.    Pt is here for complaints of severe pain mostly in left leg from hip down to foot..  She is asking for a "shot" today.    She is frustrated as she went to neurosurgery with the hopes of getting an injection in her back and they suggested therapy.  She has been to therapy twice, once last week and once this week and admits it helps for a short period of time (up to 2 days)  She is using some OTC saline patches which seems to help.  Taking Naproxen BID.  Taking Tramadol once/day.  She doesn't feel like that is particularly helpful.  She states she took Gabapentin at one time and doesn't remember how it worked.  She is willing to try  Relevant past medical, surgical, family and social history reviewed and updated as indicated. Interim medical history since our last visit reviewed. Allergies and medications reviewed and updated.  Review of Systems  Per HPI unless specifically indicated above     Objective:    BP 126/85 (BP Location: Left Arm, Cuff Size: Normal)   Pulse 95   Temp 97.8 F (36.6 C) (Oral)   Ht 4\' 11"  (1.499 m)   Wt 141 lb 4.8 oz (64.1 kg)   LMP  (LMP Unknown)   SpO2 94%   BMI 28.54 kg/m   Wt Readings from Last 3 Encounters:  12/08/17 141 lb 4.8 oz (64.1 kg)  11/09/17 141 lb 5 oz (64.1 kg)  10/12/17 144 lb 3 oz (65.4 kg)    Physical Exam  Constitutional: She is oriented to person, place, and time. She appears  well-developed and well-nourished. No distress.  HENT:  Head: Normocephalic and atraumatic.  Eyes: Conjunctivae and lids are normal. Right eye exhibits no discharge. Left eye exhibits no discharge. No scleral icterus.  Cardiovascular: Normal rate.  Pulmonary/Chest: Effort normal.  Abdominal: Normal appearance. There is no splenomegaly or hepatomegaly.  Musculoskeletal: Normal range of motion.  Neurological: She is alert and oriented to person, place, and time.  Skin: Skin is intact. No rash noted. No pallor.  Psychiatric: She has a normal mood and affect. Her behavior is normal. Judgment and thought content normal.    Results for orders placed or performed in visit on 09/12/17  Microscopic Examination  Result Value Ref Range   WBC, UA 0-5 0 - 5 /hpf   RBC, UA 0-2 0 - 2 /hpf   Epithelial Cells (non renal) >10 (A) 0 - 10 /hpf   Renal Epithel, UA 0-10 (A) None seen /hpf   Bacteria, UA Few None seen/Few  CBC with Differential/Platelet  Result Value Ref Range   WBC 10.1 3.4 - 10.8 x10E3/uL   RBC 4.43 3.77 - 5.28 x10E6/uL  Hemoglobin 14.0 11.1 - 15.9 g/dL   Hematocrit 41.8 34.0 - 46.6 %   MCV 94 79 - 97 fL   MCH 31.6 26.6 - 33.0 pg   MCHC 33.5 31.5 - 35.7 g/dL   RDW 13.3 12.3 - 15.4 %   Platelets 255 150 - 379 x10E3/uL   Neutrophils 66 Not Estab. %   Lymphs 23 Not Estab. %   Monocytes 7 Not Estab. %   Eos 3 Not Estab. %   Basos 0 Not Estab. %   Neutrophils Absolute 6.8 1.4 - 7.0 x10E3/uL   Lymphocytes Absolute 2.3 0.7 - 3.1 x10E3/uL   Monocytes Absolute 0.8 0.1 - 0.9 x10E3/uL   EOS (ABSOLUTE) 0.3 0.0 - 0.4 x10E3/uL   Basophils Absolute 0.0 0.0 - 0.2 x10E3/uL   Immature Granulocytes 1 Not Estab. %   Immature Grans (Abs) 0.1 0.0 - 0.1 x10E3/uL  Comprehensive metabolic panel  Result Value Ref Range   Glucose 107 (H) 65 - 99 mg/dL   BUN 19 8 - 27 mg/dL   Creatinine, Ser 0.95 0.57 - 1.00 mg/dL   GFR calc non Af Amer 59 (L) >59 mL/min/1.73   GFR calc Af Amer 68 >59 mL/min/1.73     BUN/Creatinine Ratio 20 12 - 28   Sodium 143 134 - 144 mmol/L   Potassium 4.1 3.5 - 5.2 mmol/L   Chloride 105 96 - 106 mmol/L   CO2 23 20 - 29 mmol/L   Calcium 9.4 8.7 - 10.3 mg/dL   Total Protein 6.9 6.0 - 8.5 g/dL   Albumin 4.5 3.5 - 4.8 g/dL   Globulin, Total 2.4 1.5 - 4.5 g/dL   Albumin/Globulin Ratio 1.9 1.2 - 2.2   Bilirubin Total 0.2 0.0 - 1.2 mg/dL   Alkaline Phosphatase 72 39 - 117 IU/L   AST 17 0 - 40 IU/L   ALT 18 0 - 32 IU/L  Microalbumin, Urine Waived  Result Value Ref Range   Microalb, Ur Waived 80 (H) 0 - 19 mg/L   Creatinine, Urine Waived 200 10 - 300 mg/dL   Microalb/Creat Ratio 30-300 (H) <30 mg/g  TSH  Result Value Ref Range   TSH 1.670 0.450 - 4.500 uIU/mL  UA/M w/rflx Culture, Routine  Result Value Ref Range   Specific Gravity, UA 1.025 1.005 - 1.030   pH, UA 5.5 5.0 - 7.5   Color, UA Yellow Yellow   Appearance Ur Cloudy (A) Clear   Leukocytes, UA Negative Negative   Protein, UA Trace (A) Negative/Trace   Glucose, UA Negative Negative   Ketones, UA Negative Negative   RBC, UA Trace (A) Negative   Bilirubin, UA Negative Negative   Urobilinogen, Ur 1.0 0.2 - 1.0 mg/dL   Nitrite, UA Negative Negative   Microscopic Examination See below:   VITAMIN D 25 Hydroxy (Vit-D Deficiency, Fractures)  Result Value Ref Range   Vit D, 25-Hydroxy 17.5 (L) 30.0 - 100.0 ng/mL      Assessment & Plan:   Problem List Items Addressed This Visit      Unprioritized   Spinal stenosis of lumbar region    Pt willing to continue with PT when she can make it over.  Start Gabapentin 200 mg.  Start with one in the evening before bed.  OK to titrate up to TID.  Consider Cymbalta.            Follow up plan: Return prn and at a scheduled visit with Dr. Wynetta Emery

## 2017-12-12 DIAGNOSIS — M5136 Other intervertebral disc degeneration, lumbar region: Secondary | ICD-10-CM | POA: Diagnosis not present

## 2017-12-14 ENCOUNTER — Other Ambulatory Visit: Payer: Self-pay | Admitting: Family Medicine

## 2017-12-18 ENCOUNTER — Other Ambulatory Visit: Payer: Self-pay

## 2017-12-18 ENCOUNTER — Observation Stay: Payer: Medicare Other

## 2017-12-18 ENCOUNTER — Emergency Department: Payer: Medicare Other

## 2017-12-18 ENCOUNTER — Observation Stay
Admission: EM | Admit: 2017-12-18 | Discharge: 2017-12-19 | Disposition: A | Discharge status: Home / Self Care | Payer: Medicare Other | Attending: Internal Medicine | Admitting: Internal Medicine

## 2017-12-18 ENCOUNTER — Ambulatory Visit (INDEPENDENT_AMBULATORY_CARE_PROVIDER_SITE_OTHER): Payer: Medicare Other | Admitting: Family Medicine

## 2017-12-18 ENCOUNTER — Encounter: Payer: Self-pay | Admitting: Family Medicine

## 2017-12-18 VITALS — BP 125/84 | HR 86 | Temp 97.7°F | Wt 140.4 lb

## 2017-12-18 DIAGNOSIS — Z87891 Personal history of nicotine dependence: Secondary | ICD-10-CM | POA: Insufficient documentation

## 2017-12-18 DIAGNOSIS — R202 Paresthesia of skin: Secondary | ICD-10-CM | POA: Diagnosis not present

## 2017-12-18 DIAGNOSIS — R410 Disorientation, unspecified: Secondary | ICD-10-CM

## 2017-12-18 DIAGNOSIS — Z79899 Other long term (current) drug therapy: Secondary | ICD-10-CM | POA: Diagnosis not present

## 2017-12-18 DIAGNOSIS — K219 Gastro-esophageal reflux disease without esophagitis: Secondary | ICD-10-CM | POA: Diagnosis not present

## 2017-12-18 DIAGNOSIS — Z66 Do not resuscitate: Secondary | ICD-10-CM | POA: Insufficient documentation

## 2017-12-18 DIAGNOSIS — E559 Vitamin D deficiency, unspecified: Secondary | ICD-10-CM | POA: Insufficient documentation

## 2017-12-18 DIAGNOSIS — R42 Dizziness and giddiness: Secondary | ICD-10-CM | POA: Diagnosis not present

## 2017-12-18 DIAGNOSIS — M8580 Other specified disorders of bone density and structure, unspecified site: Secondary | ICD-10-CM | POA: Insufficient documentation

## 2017-12-18 DIAGNOSIS — I6782 Cerebral ischemia: Secondary | ICD-10-CM | POA: Insufficient documentation

## 2017-12-18 DIAGNOSIS — R531 Weakness: Secondary | ICD-10-CM | POA: Diagnosis not present

## 2017-12-18 DIAGNOSIS — F419 Anxiety disorder, unspecified: Secondary | ICD-10-CM | POA: Diagnosis not present

## 2017-12-18 DIAGNOSIS — Z8249 Family history of ischemic heart disease and other diseases of the circulatory system: Secondary | ICD-10-CM | POA: Diagnosis not present

## 2017-12-18 DIAGNOSIS — R011 Cardiac murmur, unspecified: Secondary | ICD-10-CM | POA: Diagnosis not present

## 2017-12-18 DIAGNOSIS — G3189 Other specified degenerative diseases of nervous system: Secondary | ICD-10-CM | POA: Diagnosis not present

## 2017-12-18 DIAGNOSIS — F331 Major depressive disorder, recurrent, moderate: Secondary | ICD-10-CM | POA: Diagnosis not present

## 2017-12-18 DIAGNOSIS — G459 Transient cerebral ischemic attack, unspecified: Principal | ICD-10-CM | POA: Insufficient documentation

## 2017-12-18 DIAGNOSIS — R269 Unspecified abnormalities of gait and mobility: Secondary | ICD-10-CM | POA: Diagnosis not present

## 2017-12-18 DIAGNOSIS — E785 Hyperlipidemia, unspecified: Secondary | ICD-10-CM | POA: Diagnosis not present

## 2017-12-18 DIAGNOSIS — Z85828 Personal history of other malignant neoplasm of skin: Secondary | ICD-10-CM | POA: Insufficient documentation

## 2017-12-18 DIAGNOSIS — I10 Essential (primary) hypertension: Secondary | ICD-10-CM | POA: Diagnosis not present

## 2017-12-18 DIAGNOSIS — M1990 Unspecified osteoarthritis, unspecified site: Secondary | ICD-10-CM | POA: Insufficient documentation

## 2017-12-18 DIAGNOSIS — F329 Major depressive disorder, single episode, unspecified: Secondary | ICD-10-CM | POA: Diagnosis not present

## 2017-12-18 DIAGNOSIS — N182 Chronic kidney disease, stage 2 (mild): Secondary | ICD-10-CM | POA: Insufficient documentation

## 2017-12-18 DIAGNOSIS — I129 Hypertensive chronic kidney disease with stage 1 through stage 4 chronic kidney disease, or unspecified chronic kidney disease: Secondary | ICD-10-CM | POA: Insufficient documentation

## 2017-12-18 DIAGNOSIS — Z888 Allergy status to other drugs, medicaments and biological substances status: Secondary | ICD-10-CM | POA: Diagnosis not present

## 2017-12-18 DIAGNOSIS — G3184 Mild cognitive impairment, so stated: Secondary | ICD-10-CM

## 2017-12-18 DIAGNOSIS — Z885 Allergy status to narcotic agent status: Secondary | ICD-10-CM | POA: Diagnosis not present

## 2017-12-18 DIAGNOSIS — R7303 Prediabetes: Secondary | ICD-10-CM | POA: Diagnosis not present

## 2017-12-18 DIAGNOSIS — M48061 Spinal stenosis, lumbar region without neurogenic claudication: Secondary | ICD-10-CM

## 2017-12-18 DIAGNOSIS — R61 Generalized hyperhidrosis: Secondary | ICD-10-CM | POA: Diagnosis not present

## 2017-12-18 DIAGNOSIS — R609 Edema, unspecified: Secondary | ICD-10-CM | POA: Diagnosis not present

## 2017-12-18 DIAGNOSIS — R4781 Slurred speech: Secondary | ICD-10-CM | POA: Diagnosis not present

## 2017-12-18 DIAGNOSIS — I639 Cerebral infarction, unspecified: Secondary | ICD-10-CM

## 2017-12-18 LAB — GLUCOSE, CAPILLARY: GLUCOSE-CAPILLARY: 124 mg/dL — AB (ref 65–99)

## 2017-12-18 LAB — COMPREHENSIVE METABOLIC PANEL
ALK PHOS: 87 U/L (ref 38–126)
ALT: 25 U/L (ref 14–54)
ANION GAP: 10 (ref 5–15)
AST: 27 U/L (ref 15–41)
Albumin: 4.4 g/dL (ref 3.5–5.0)
BUN: 11 mg/dL (ref 6–20)
CHLORIDE: 103 mmol/L (ref 101–111)
CO2: 26 mmol/L (ref 22–32)
Calcium: 9.2 mg/dL (ref 8.9–10.3)
Creatinine, Ser: 0.7 mg/dL (ref 0.44–1.00)
GFR calc non Af Amer: >60 mL/min (ref >60)
Glucose, Bld: 141 mg/dL — ABNORMAL HIGH (ref 65–99)
Potassium: 3.3 mmol/L — ABNORMAL LOW (ref 3.5–5.1)
SODIUM: 139 mmol/L (ref 135–145)
Total Bilirubin: 0.4 mg/dL (ref 0.3–1.2)
Total Protein: 7.7 g/dL (ref 6.5–8.1)

## 2017-12-18 LAB — DIFFERENTIAL
BASOS PCT: 1 %
Basophils Absolute: 0.1 10*3/uL (ref 0–0.1)
EOS PCT: 2 %
Eosinophils Absolute: 0.2 10*3/uL (ref 0–0.7)
Lymphocytes Relative: 14 %
Lymphs Abs: 1.6 10*3/uL (ref 1.0–3.6)
MONO ABS: 0.9 10*3/uL (ref 0.2–0.9)
Monocytes Relative: 7 %
Neutro Abs: 9 10*3/uL — ABNORMAL HIGH (ref 1.4–6.5)
Neutrophils Relative %: 76 %

## 2017-12-18 LAB — CBC
HCT: 42.5 % (ref 35.0–47.0)
Hemoglobin: 14.2 g/dL (ref 12.0–16.0)
MCH: 31.4 pg (ref 26.0–34.0)
MCHC: 33.3 g/dL (ref 32.0–36.0)
MCV: 94.3 fL (ref 80.0–100.0)
PLATELETS: 231 10*3/uL (ref 150–440)
RBC: 4.51 MIL/uL (ref 3.80–5.20)
RDW: 12.9 % (ref 11.5–14.5)
WBC: 11.7 10*3/uL — ABNORMAL HIGH (ref 3.6–11.0)

## 2017-12-18 LAB — PROTIME-INR
INR: 0.9
PROTHROMBIN TIME: 12.1 s (ref 11.4–15.2)

## 2017-12-18 LAB — TROPONIN I

## 2017-12-18 LAB — APTT: aPTT: 24 seconds (ref 24–36)

## 2017-12-18 LAB — BAYER DCA HB A1C WAIVED: HB A1C (BAYER DCA - WAIVED): 5.7 % (ref <7.0)

## 2017-12-18 MED ORDER — VITAMIN D 1000 UNITS PO TABS
1000.0000 [IU] | ORAL_TABLET | Freq: Every day | ORAL | Status: DC
  Administered 2017-12-19: 13:00:00 1000 [IU] via ORAL
  Filled 2017-12-18: qty 1

## 2017-12-18 MED ORDER — STROKE: EARLY STAGES OF RECOVERY BOOK
Freq: Once | Status: AC
  Administered 2017-12-18: 22:00:00 1

## 2017-12-18 MED ORDER — ARIPIPRAZOLE 5 MG PO TABS
5.0000 mg | ORAL_TABLET | Freq: Every day | ORAL | Status: DC
  Administered 2017-12-19: 5 mg via ORAL
  Filled 2017-12-18: qty 1

## 2017-12-18 MED ORDER — BUSPIRONE HCL 15 MG PO TABS
15.0000 mg | ORAL_TABLET | Freq: Three times a day (TID) | ORAL | Status: DC
  Administered 2017-12-18 – 2017-12-19 (×2): 15 mg via ORAL
  Filled 2017-12-18 (×4): qty 1

## 2017-12-18 MED ORDER — SODIUM CHLORIDE 0.9 % IV SOLN
INTRAVENOUS | Status: DC
  Administered 2017-12-18: 22:00:00 via INTRAVENOUS

## 2017-12-18 MED ORDER — SODIUM CHLORIDE 0.9 % IV BOLUS
500.0000 mL | Freq: Once | INTRAVENOUS | Status: AC
  Administered 2017-12-18: 500 mL via INTRAVENOUS

## 2017-12-18 MED ORDER — FLUTICASONE PROPIONATE 50 MCG/ACT NA SUSP
2.0000 | Freq: Every day | NASAL | Status: DC
  Filled 2017-12-18: qty 16

## 2017-12-18 MED ORDER — ACETAMINOPHEN 325 MG PO TABS: 650.0000 mg | ORAL_TABLET | ORAL | Status: DC | PRN

## 2017-12-18 MED ORDER — VITAMIN D (ERGOCALCIFEROL) 1.25 MG (50000 UNIT) PO CAPS: 50000.0000 [IU] | ORAL_CAPSULE | ORAL | Status: DC

## 2017-12-18 MED ORDER — HEPARIN SODIUM (PORCINE) 5000 UNIT/ML IJ SOLN
5000.0000 [IU] | Freq: Three times a day (TID) | INTRAMUSCULAR | Status: DC
  Administered 2017-12-18 – 2017-12-19 (×2): 5000 [IU] via SUBCUTANEOUS
  Filled 2017-12-18 (×2): qty 1

## 2017-12-18 MED ORDER — ACETAMINOPHEN 650 MG RE SUPP: 650.0000 mg | RECTAL | Status: DC | PRN

## 2017-12-18 MED ORDER — SENNOSIDES-DOCUSATE SODIUM 8.6-50 MG PO TABS
1.0000 | ORAL_TABLET | Freq: Every evening | ORAL | Status: DC | PRN
  Administered 2017-12-19: 1 via ORAL
  Filled 2017-12-18: qty 1

## 2017-12-18 MED ORDER — DULOXETINE HCL 60 MG PO CPEP: 60.0000 mg | ORAL_CAPSULE | Freq: Every day | ORAL | 1 refills | Status: DC

## 2017-12-18 MED ORDER — PANTOPRAZOLE SODIUM 40 MG PO TBEC
40.0000 mg | DELAYED_RELEASE_TABLET | Freq: Every day | ORAL | Status: DC
  Administered 2017-12-19: 40 mg via ORAL
  Filled 2017-12-18: qty 1

## 2017-12-18 MED ORDER — ASPIRIN EC 81 MG PO TBEC
81.0000 mg | DELAYED_RELEASE_TABLET | Freq: Every day | ORAL | Status: DC
  Administered 2017-12-19: 13:00:00 81 mg via ORAL
  Filled 2017-12-18: qty 1

## 2017-12-18 MED ORDER — ATORVASTATIN CALCIUM 20 MG PO TABS
40.0000 mg | ORAL_TABLET | Freq: Every day | ORAL | Status: DC
  Administered 2017-12-18: 22:00:00 40 mg via ORAL
  Filled 2017-12-18: qty 2

## 2017-12-18 MED ORDER — ALBUTEROL SULFATE (2.5 MG/3ML) 0.083% IN NEBU: 2.5000 mg | INHALATION_SOLUTION | Freq: Four times a day (QID) | RESPIRATORY_TRACT | Status: DC | PRN

## 2017-12-18 MED ORDER — AMLODIPINE BESYLATE 10 MG PO TABS
10.0000 mg | ORAL_TABLET | Freq: Every day | ORAL | Status: DC
  Administered 2017-12-19: 10 mg via ORAL
  Filled 2017-12-18: qty 1

## 2017-12-18 MED ORDER — LOSARTAN POTASSIUM 50 MG PO TABS
50.0000 mg | ORAL_TABLET | Freq: Every day | ORAL | Status: DC
  Administered 2017-12-19: 50 mg via ORAL
  Filled 2017-12-18: qty 1

## 2017-12-18 MED ORDER — ACETAMINOPHEN 160 MG/5ML PO SOLN
650.0000 mg | ORAL | Status: DC | PRN
  Filled 2017-12-18: qty 20.3

## 2017-12-18 MED ORDER — LORATADINE 10 MG PO TABS
10.0000 mg | ORAL_TABLET | Freq: Every day | ORAL | Status: DC
  Administered 2017-12-19: 10 mg via ORAL
  Filled 2017-12-18: qty 1

## 2017-12-18 MED ORDER — TRAMADOL HCL 50 MG PO TABS
50.0000 mg | ORAL_TABLET | Freq: Three times a day (TID) | ORAL | Status: DC | PRN
  Administered 2017-12-19: 50 mg via ORAL
  Filled 2017-12-18: qty 1

## 2017-12-18 NOTE — H&P (Signed)
[facility] [place] NAME: Cynthia Vaughan    MR#:  [id number]  DATE OF BIRTH:  11-23-1940  DATE OF ADMISSION:  12/18/2017  PRIMARY CARE PHYSICIAN: [name], DO   REQUESTING/REFERRING PHYSICIAN: Seidecki  CHIEF COMPLAINT:   Chief Complaint  Patient presents with  . Code Stroke    HISTORY OF PRESENT ILLNESS: Cynthia Vaughan  is a 77 y.o. female with a known history of anxiety, skin cancer, chronic kidney disease, depression, gastroesophageal disease, hyperlipidemia, hypertension, microalbuminuria, osteopenia, [place] spotted fever, went to her doctor's office today for routine checkup and while coming out she had episode of feeling very weakand losing balance, she also had slurred speech and facial weakness. They checked her blood pressure and sugar at doctor's office and it was normal also suggested to go to emergency room right away. In the emergency room she is checked for CT head which was negative but because of her significant symptoms ER physician suggested to do a stroke workup. Symptomatically she is much improved now.  PAST MEDICAL HISTORY:   Past Medical History:  Diagnosis Date  . Allergy   . Anxiety   . Cancer ([place])    SKIN  . CKD (chronic kidney disease) stage 2, GFR 60-89 ml/min   . Depression   . Edema    LEGS/FEET  . GERD (gastroesophageal reflux disease)   . Heart murmur   . Hyperlipidemia   . Hypertension   . Hypopotassemia   . Microalbuminuria   . Osteoarthritis   . Osteopenia   . [facility] spotted fever) 05/10/2016  . Urinary incontinence   . Vitamin D deficiency disease   . Wheezing     PAST SURGICAL HISTORY:  Past Surgical History:  Procedure Laterality Date  . ABDOMINAL HYSTERECTOMY  2004   Total (due to bladder surgery)  . BLADDER SURGERY     x 2  . BUNIONECTOMY    . CATARACT EXTRACTION W/PHACO Right 11/23/2015   Procedure: CATARACT EXTRACTION PHACO AND INTRAOCULAR LENS PLACEMENT  (IOC);  Surgeon: Estill Cotta, MD;  Location: [facility] ORS;  Service: Ophthalmology;  Laterality: Right;  [place]    1:30.9 AP%  26.9 CDE   42.29 flud casette lot # [id number]  exp05/31/2018  . COLONOSCOPY      SOCIAL HISTORY:  Social History   Tobacco Use  . Smoking status: Former Research scientist (life sciences)  . Smokeless tobacco: Never Used  . Tobacco comment: quit 20 years ago   Substance Use Topics  . Alcohol use: Yes    Comment: OCCAS    FAMILY HISTORY:  Family History  Problem Relation Age of Onset  . AAA (abdominal aortic aneurysm) Mother   . Arthritis Brother   . Heart disease Brother   . Stroke Daughter   . Diabetes Son   . Stroke Son   . Seizures Son   . Diabetes Brother   . Heart disease Brother        7 total heart attacks  . Hyperlipidemia Brother   . Hypertension Brother   . Dementia Brother   . AAA (abdominal aortic aneurysm) Brother   . Stroke Son   . Heart disease Brother   . Dementia Sister     DRUG ALLERGIES:  Allergies  Allergen Reactions  . Codeine Itching  . Lisinopril Cough    REVIEW OF SYSTEMS:   CONSTITUTIONAL: No fever, have fatigue or weakness.  EYES: No blurred or double vision.  EARS, NOSE, AND THROAT:  No tinnitus or ear pain.  RESPIRATORY: No cough, shortness of breath, wheezing or hemoptysis.  CARDIOVASCULAR: No chest pain, orthopnea, edema.  GASTROINTESTINAL: No nausea, vomiting, diarrhea or abdominal pain.  GENITOURINARY: No dysuria, hematuria.  ENDOCRINE: No polyuria, nocturia,  HEMATOLOGY: No anemia, easy bruising or bleeding SKIN: No rash or lesion. MUSCULOSKELETAL: No joint pain or arthritis.   NEUROLOGIC: No tingling, numbness, weakness.  PSYCHIATRY: No anxiety or depression.   MEDICATIONS AT HOME:  Prior to Admission medications   Medication Sig Start Date End Date Taking? Authorizing Provider  albuterol (PROAIR HFA) 108 (90 Base) MCG/ACT inhaler INHALE 2 PUFFS INTO THE LUNGS EVERY 6 HOURS AS NEEDED FOR WHEEZING OR SHORTNESS OF BREATH  07/24/17  Yes Johnson, Megan P, DO  amLODipine (NORVASC) 10 MG tablet Take 1 tablet (10 mg total) by mouth daily. 11/28/17  Yes Johnson, Megan P, DO  ARIPiprazole (ABILIFY) 5 MG tablet Take 1 tablet (5 mg total) by mouth daily. 11/28/17  Yes Johnson, Megan P, DO  aspirin EC 81 MG tablet Take 81 mg by mouth daily.   Yes [provider]  atorvastatin (LIPITOR) 40 MG tablet Take 1 tablet (40 mg total) by mouth at bedtime. 11/28/17  Yes Johnson, Megan P, DO  busPIRone (BUSPAR) 15 MG tablet Take 1 tablet (15 mg total) by mouth 3 (three) times daily. 11/28/17  Yes Johnson, Megan P, DO  Cholecalciferol (VITAMIN D-3) 1000 units CAPS Take 1 capsule (1,000 Units total) by mouth daily. 11/28/17  Yes Johnson, Megan P, DO  fluticasone (FLONASE) 50 MCG/ACT nasal spray Place 2 sprays into both nostrils daily. 11/28/17  Yes Johnson, Megan P, DO  loratadine (CLARITIN) 10 MG tablet Take 1 tablet (10 mg total) by mouth daily. 11/28/17  Yes Johnson, Megan P, DO  losartan (COZAAR) 50 MG tablet Take 1 tablet (50 mg total) by mouth daily. 11/28/17  Yes Johnson, Megan P, DO  naproxen (NAPROSYN) 500 MG tablet TAKE 1 TABLET(500 MG) BY MOUTH TWICE DAILY WITH A MEAL 11/28/17  Yes Johnson, Megan P, DO  omeprazole (PRILOSEC) 20 MG capsule Take 1 capsule (20 mg total) by mouth daily. 11/28/17  Yes Johnson, Megan P, DO  ranitidine (ZANTAC) 150 MG tablet Take 2 tablets (300 mg total) by mouth every evening. 11/28/17  Yes Johnson, Megan P, DO  traMADol (ULTRAM) 50 MG tablet Take 1 tablet (50 mg total) by mouth every 8 (eight) hours as needed. 11/09/17  Yes Johnson, Megan P, DO  Vitamin D, Ergocalciferol, (DRISDOL) 50000 units CAPS capsule Take 1 capsule (50,000 Units total) by mouth every 7 (seven) days. 12/14/17  Yes Johnson, Megan P, DO  DULoxetine (CYMBALTA) 60 MG capsule Take 1 capsule (60 mg total) by mouth daily. Patient not taking: Reported on 12/18/2017 12/18/17   Park Liter P, DO      PHYSICAL EXAMINATION:   VITAL SIGNS: Blood  pressure 125/82, pulse 75, temperature 97.6 F (36.4 C), resp. rate 16, height 4\' 11"  (1.499 m), weight 63.5 kg (140 lb), SpO2 93 %.  GENERAL:  77 y.o.-year-old patient lying in the bed with no acute distress.  EYES: Pupils equal, round, reactive to light and accommodation. No scleral icterus. Extraocular muscles intact.  HEENT: Head atraumatic, normocephalic. Oropharynx and nasopharynx clear.  NECK:  Supple, no jugular venous distention. No thyroid enlargement, no tenderness.  LUNGS: Normal breath sounds bilaterally, no wheezing, rales,rhonchi or crepitation. No use of accessory muscles of respiration.  CARDIOVASCULAR: S1, S2 normal. No murmurs, rubs, or gallops.  ABDOMEN: Soft, nontender,  nondistended. Bowel sounds present. No organomegaly or mass.  EXTREMITIES: No pedal edema, cyanosis, or clubbing.  NEUROLOGIC: Cranial nerves II through XII are intact. Muscle strength 5/5 in all extremities. Sensation intact. Gait not checked.  PSYCHIATRIC: The patient is alert and oriented x 3.  SKIN: No obvious rash, lesion, or ulcer.   LABORATORY PANEL:   CBC Recent Labs  Lab 12/18/17 1559  WBC 11.7*  HGB 14.2  HCT 42.5  PLT 231  MCV 94.3  MCH 31.4  MCHC 33.3  RDW 12.9  LYMPHSABS 1.6  MONOABS 0.9  EOSABS 0.2  BASOSABS 0.1   ------------------------------------------------------------------------------------------------------------------  Chemistries  Recent Labs  Lab 12/18/17 1559  NA 139  K 3.3*  CL 103  CO2 26  GLUCOSE 141*  BUN 11  CREATININE 0.70  CALCIUM 9.2  AST 27  ALT 25  ALKPHOS 87  BILITOT 0.4   ------------------------------------------------------------------------------------------------------------------ estimated creatinine clearance is 49.2 mL/min (by C-G formula based on SCr of 0.7 mg/dL). ------------------------------------------------------------------------------------------------------------------ No results for input(s): TSH, T4TOTAL, T3FREE,  THYROIDAB in the last 72 hours.  Invalid input(s): FREET3   Coagulation profile Recent Labs  Lab 12/18/17 1559  INR 0.90   ------------------------------------------------------------------------------------------------------------------- No results for input(s): DDIMER in the last 72 hours. -------------------------------------------------------------------------------------------------------------------  Cardiac Enzymes Recent Labs  Lab 12/18/17 1559  TROPONINI <0.03   ------------------------------------------------------------------------------------------------------------------ Invalid input(s): POCBNP  ---------------------------------------------------------------------------------------------------------------  Urinalysis    Component Value Date/Time   COLORURINE Straw 06/08/2014 1528   APPEARANCEUR Cloudy (A) 09/12/2017 1535   LABSPEC 1.005 06/08/2014 1528   PHURINE 7.0 06/08/2014 1528   GLUCOSEU Negative 09/12/2017 1535   GLUCOSEU Negative 06/08/2014 1528   HGBUR Negative 06/08/2014 1528   BILIRUBINUR Negative 09/12/2017 1535   BILIRUBINUR Negative 06/08/2014 1528   KETONESUR Negative 06/08/2014 1528   PROTEINUR Trace (A) 09/12/2017 1535   PROTEINUR Negative 06/08/2014 1528   NITRITE Negative 09/12/2017 1535   NITRITE Negative 06/08/2014 1528   LEUKOCYTESUR Negative 09/12/2017 1535   LEUKOCYTESUR Negative 06/08/2014 1528     RADIOLOGY: Dg Chest Portable 1 View  Result Date: 12/18/2017 CLINICAL DATA:  sudden onset dizziness "I feel drunk" states she was staggering And has a feeling of her tongue thickening and husband states she has some slurred speech, pt has a resting left sided facial droop. EXAM: PORTABLE CHEST - 1 VIEW COMPARISON:  09/17/2013 FINDINGS: Relatively low lung volumes with minimal linear subsegmental atelectasis in the lung bases. Heart size and mediastinal contours are within normal limits. No effusion. Visualized bones unremarkable.  IMPRESSION: Low volumes.  No acute disease. Electronically Signed   By: Lucrezia Europe M.D.   On: 12/18/2017 17:10   Ct Head Code Stroke Wo Contrast  Result Date: 12/18/2017 CLINICAL DATA:  Code stroke. Sudden onset of dizziness. Focal neuro deficit, less than 6 hours. Slurred speech. Left-sided facial droop at baseline. EXAM: CT HEAD WITHOUT CONTRAST TECHNIQUE: Contiguous axial images were obtained from the base of the skull through the vertex without intravenous contrast. 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 Other white matter disease and atrophy is similar the prior exam. No acute cortical infarct is present. Brainstem and cerebellum are normal. Ventricles are proportionate to the degree of atrophy. No significant extra-axial fluid collection is present. Vascular: Atherosclerotic calcifications are present within the internal carotid arteries bilaterally without a hyperdense vessel. Skull: Calvarium is intact. No focal lytic or blastic lesions are present. Sinuses/Orbits: Paranasal sinuses and mastoid air  cells are clear. Globes and orbits are within normal limits. ASPECTS [facility] Stroke Program Early CT Score) - Ganglionic level infarction (caudate, lentiform nuclei, internal capsule, insula, M1-M3 cortex): 7/7 - Supraganglionic infarction (M4-M6 cortex): 3/3 Total score (0-10 with 10 being normal): 10/10 IMPRESSION: 1. No acute intracranial abnormality or significant interval change 2. Stable white matter disease. 3. 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 4. Atherosclerosis 5. ASPECTS is 10/10 Electronically Signed   By: San Morelle M.D.   On: 12/18/2017 16:09    EKG: Orders placed or performed during the hospital encounter of 12/18/17  . ED EKG  . ED EKG  . EKG 12-Lead  . EKG 12-Lead    IMPRESSION AND PLAN:  * TIA   Monitor on telemetry, check MRI and MRA of  brain   Echocardiogram and carotid Doppler study.   Check lipid panel.   Continue aspirin.   Neurology consult and physical therapy and swallowing evaluation.  * hypertension   Continue amlodipine, losartan  * hyperlipidemia   Continue atorvastatin.  * gastroesophageal reflux disease   Continue PPI.  All the records are reviewed and case discussed with ED provider. Management plans discussed with the patient, family and they are in agreement.  CODE STATUS:dO NOT RESUSCITATE    TOTAL TIME TAKING CARE OF THIS PATIENT: 50 minutes.    Vaughan Basta M.D on 12/18/2017   Between 7am to 6pm - Pager - [phone]  After 6pm go to [email or web address] - password [email or web address] [facility]  Office  [phone]  CC: Primary care physician; [name], DO   Note: This dictation was prepared with Dragon dictation along with smaller phrase technology. Any transcriptional errors that result from this process are unintentional.

## 2017-12-18 NOTE — Progress Notes (Signed)
   12/18/17 1545  Clinical Encounter Type  Visited With Patient and family together  Visit Type Code  Referral From Nurse  Consult/Referral To Chaplain  Spiritual Encounters  Spiritual Needs Prayer   [place] received a Code Stroke. [place] reported to PT's RM. PT was being cared for by medical team. PT's husband was in the [place] and [facility] spoke with him. [place] will follow up as needed.

## 2017-12-18 NOTE — ED Triage Notes (Signed)
Pt states she was at Dr. Marcy Salvo office today for a routine check up with lab work, states as she was leaving around 230pm today she had sudden onset dizziness "I feel drunk" states she was staggering  And has a feeling of her tongue thickening and husband states she has some slurred speech, pt has a resting left sided facial droop.

## 2017-12-18 NOTE — ED Notes (Signed)
Code stroke activated with tele neuro

## 2017-12-18 NOTE — Patient Instructions (Signed)
STOP THE GABAPENTIN  STOP THE ZOLOFT ONCE YOU GET YOUR CYMBALTA  I'LL SEE YOU IN 2 WEEKS

## 2017-12-18 NOTE — ED Notes (Signed)
Pt cleared by MD to eat and drink. Pt given [place] tray sandwich and water by RN

## 2017-12-18 NOTE — Assessment & Plan Note (Signed)
Did better with PT, but cannot leave the house. Will get home PT in. Referral generated.

## 2017-12-18 NOTE — Consult Note (Signed)
   [facility] Tele[facility]urology Consult Services  Date of service: 12/18/17  Impression:  acute, transient slurred speech and L facial droop - TIA vs encephalopathy. She has ongoing imbalance, which is frequently present, nearly every day; therefore, tpa is not . Recommend admission for stroke workup.  - - -   Not a tpa candidate due to: NIHSS 1 with nondisabling symptoms.  Presentation is not suggestive of Large Vessel Occlusive Disease. Thrombectomy would not be recommended.   Differential Diagnosis:   1. Cardioembolic stroke  2. Small vessel disease/lacune  3. Thromboembolic, artery-to-artery mechanism  4. Hypercoagulable state-related infarct  5. Transient ischemic attack  6. Thrombotic mechanism, large artery disease   Comments:   Door time: 1536 [facility] contacted: [phone] [facility] at bedside: 1606 NIHSS assessment time: 1608  Recommendations:  ASA DVT proph - lovenox permissive htn PT/OT/speech bedside swallow eval  Inpatient neurology consultation Inpatient stroke evaluation as per Neurology/ Internal Medicine Discussed with ED MD Please call with questions  -----------------------------------------------------------------------------------------  CC left facial droop and slurred speech  History of Present Illness   Patient is a 77 yo woman with acute onset slurred speech, imbalance, and left facial droop starting at approx 1300. No focal weakness of her limbs. She feels weak all over. No LOC/convulsion. She feels off balance every morning when she wakes up, which improves throughout the day.   Diagnostic: CT unremarakble.  Exam: RESULT SUMMARY: 1 points NIH Stroke Scale   INPUTS: 1A: Level of consciousness -> 0 = Alert; keenly responsive 1B: Ask month and age -> 0 = Both questions right 1C: 'Blink eyes' & 'squeeze hands' -> 0 = Performs both tasks 2: Horizontal extraocular movements -> 0 = Normal 3: Visual fields -> 0 = No visual  loss 4: Facial palsy -> 0 = Normal symmetry 5A: Left arm motor drift -> 0 = No drift for 10 seconds 5B: Right arm motor drift -> 0 = No drift for 10 seconds 6A: Left leg motor drift -> 0 = No drift for 5 seconds 6B: Right leg motor drift -> 0 = No drift for 5 seconds 7: Limb Ataxia -> 0 = No ataxia 8: Sensation -> 1 = Mild-moderate loss: less sharp/more dull  9: Language/aphasia -> 0 = Normal; no aphasia 10: Dysarthria -> 0 = Normal 11: Extinction/inattention -> 0 = No abnormality  Medical Decision Making:  - Extensive number of diagnosis or management options are considered above.   - Extensive amount of complex data reviewed.   - High risk of complication and/or morbidity or mortality are associated with differential diagnostic considerations above.  - There may be Uncertain outcome and increased probability of prolonged functional impairment or high probability of severe prolonged functional impairment associated with some of these differential diagnosis.  Medical Data Reviewed:  1.Data reviewed include clinical labs, radiology,  Medical Tests;   2.Tests results discussed w/performing or interpreting physician;   3.Obtaining/reviewing old medical records;  4.Obtaining case history from another source;  5.Independent review of image, tracing or specimen.    Patient was informed the Neurology Consult would happen via telehealth (remote video) and consented to receiving care in this manner.

## 2017-12-18 NOTE — ED Notes (Signed)
Code  Stroke  Called  To 333/ informed  DR Cherylann Banas MD

## 2017-12-18 NOTE — Addendum Note (Signed)
Addended by: [name] on: 12/18/2017 02:52 PM   Modules accepted: Orders

## 2017-12-18 NOTE — Assessment & Plan Note (Signed)
Not herself today, very slowed. Concerned that this is from the gabapentin as she is not taking the tramadol often. Will change from zoloft to cymbalta and recheck 2 weeks. If not improved, will need to see neurology or geri-psych.

## 2017-12-18 NOTE — Code Documentation (Signed)
Pt arrives POV with complaints of slurred speech, feeling "drunk" and a thick tongue, pt was at her PCP for routine checkup when the symptoms were noticed, LKW 1250 when husband saw her before PCP appt, Code Stroke activated in triage, pt cleared for CT by Dr. Charlynn Court at 15:50, non-con head CT preformed and then pt taken to room 16, family at bedside, NIHSS 1, pt states speech is improving but states decreased sensation on the right side of her face, pt ambulated in room per neuro request, pt unsteady, states she was unsteady this AM when she woke and has been using her cane, No tPA due to improving non-disabling symptoms, report off to [employer]

## 2017-12-18 NOTE — Progress Notes (Signed)
Family Meeting Note  Advance Directive:yes  Today a meeting took place with the Patient.  The following clinical team members were present during this meeting:MD  The following were discussed:Patient's diagnosis: TIA, Patient's progosis: Unable to determine and Goals for treatment: DNR  Additional follow-up to be provided: neurology  Time spent during discussion:20 minutes  Vaughan Basta, MD

## 2017-12-18 NOTE — Progress Notes (Addendum)
BP 125/84 (BP Location: Left Arm, Patient Position: Sitting, Cuff Size: Normal)   Pulse 86   Temp 97.7 F (36.5 C)   Wt 140 lb 7 oz (63.7 kg)   LMP  (LMP Unknown)   SpO2 95%   BMI 28.36 kg/m    Subjective:    Patient ID: Cynthia Vaughan, female    DOB: 02/20/1941, 77 y.o.   MRN: [id number]  HPI: Cynthia Vaughan is a 77 y.o. female  Chief Complaint  Patient presents with  . Back Pain   Started on gabapentin last visit with plans to titrate up to TID. Has seen neurosurgery and they are having her do therapy. Per last visit it was helping for a short period of time.  She notes that she has not been going in about 3 weeks. It is unclear if she is supposed to have a follow up. She has not seen anyone. She is very slowed and not like herself today. She notes that the worst part of her pain is in her feet and legs. Has been taking her gabapentin 2x a day. Feet seem to be getting worse. Burning and tingling in nature. Nothing makes them better. Nothing makes them worse. Has been having some increased swelling today. She is otherwise feeling good with no other concerns or complaints at this time.   Relevant past medical, surgical, family and social history reviewed and updated as indicated. Interim medical history since our last visit reviewed. Allergies and medications reviewed and updated.  Review of Systems  Constitutional: Negative.   Eyes: Negative.   Respiratory: Negative.   Cardiovascular: Positive for leg swelling. Negative for chest pain and palpitations.  Gastrointestinal: Negative.   Musculoskeletal: Positive for back pain, gait problem and myalgias. Negative for arthralgias, joint swelling, neck pain and neck stiffness.  Skin: Negative.   Neurological: Positive for dizziness and numbness. Negative for tremors, seizures, syncope, facial asymmetry, speech difficulty, weakness, light-headedness and headaches.  Psychiatric/Behavioral: Positive for confusion and dysphoric mood.  Negative for agitation, behavioral problems, decreased concentration, hallucinations, self-injury, sleep disturbance and suicidal ideas. The patient is nervous/anxious. The patient is not hyperactive.     Per HPI unless specifically indicated above     Objective:    BP 125/84 (BP Location: Left Arm, Patient Position: Sitting, Cuff Size: Normal)   Pulse 86   Temp 97.7 F (36.5 C)   Wt 140 lb 7 oz (63.7 kg)   LMP  (LMP Unknown)   SpO2 95%   BMI 28.36 kg/m   Wt Readings from Last 3 Encounters:  12/18/17 140 lb 7 oz (63.7 kg)  12/08/17 141 lb 4.8 oz (64.1 kg)  11/09/17 141 lb 5 oz (64.1 kg)    Physical Exam  Constitutional: She is oriented to person, place, and time. She appears well-developed and well-nourished. No distress.  HENT:  Head: Normocephalic and atraumatic.  Right Ear: Hearing normal.  Left Ear: Hearing normal.  Nose: Nose normal.  Eyes: Conjunctivae and lids are normal. Right eye exhibits no discharge. Left eye exhibits no discharge. No scleral icterus.  Cardiovascular: Normal rate, regular rhythm and intact distal pulses. Exam reveals no gallop and no friction rub.  Murmur heard. Pulmonary/Chest: Effort normal and breath sounds normal. No stridor. No respiratory distress. She has no wheezes. She has no rales (trace bilaterally). She exhibits no tenderness.  Musculoskeletal: Normal range of motion. She exhibits edema. She exhibits no tenderness or deformity.  Neurological: She is alert and oriented to person, place,  and time.  Skin: Skin is warm, dry and intact. Capillary refill takes less than 2 seconds. No rash noted. She is not diaphoretic. No erythema. No pallor.  Psychiatric: Judgment and thought content normal. Her affect is blunt. Her speech is slurred. She is slowed. She exhibits abnormal recent memory.  Nursing note and vitals reviewed.  MMSE - Mini Mental State Exam 12/18/2017  Orientation to time 3  Orientation to Place 4  Registration 3  Attention/  Calculation 5  Recall 0  Language- name 2 objects 2  Language- repeat 1  Language- follow 3 step command 3  Language- read & follow direction 1  Write a sentence 1  Copy design 1  Total score 24     Results for orders placed or performed in visit on 09/12/17  Microscopic Examination  Result Value Ref Range   WBC, UA 0-5 0 - 5 /hpf   RBC, UA 0-2 0 - 2 /hpf   Epithelial Cells (non renal) >10 (A) 0 - 10 /hpf   Renal Epithel, UA 0-10 (A) None seen /hpf   Bacteria, UA Few None seen/Few  CBC with Differential/Platelet  Result Value Ref Range   WBC 10.1 3.4 - 10.8 x10E3/uL   RBC 4.43 3.77 - 5.28 x10E6/uL   Hemoglobin 14.0 11.1 - 15.9 g/dL   Hematocrit 41.8 34.0 - 46.6 %   MCV 94 79 - 97 fL   MCH 31.6 26.6 - 33.0 pg   MCHC 33.5 31.5 - 35.7 g/dL   RDW 13.3 12.3 - 15.4 %   Platelets 255 150 - 379 x10E3/uL   Neutrophils 66 Not Estab. %   Lymphs 23 Not Estab. %   Monocytes 7 Not Estab. %   Eos 3 Not Estab. %   Basos 0 Not Estab. %   Neutrophils Absolute 6.8 1.4 - 7.0 x10E3/uL   Lymphocytes Absolute 2.3 0.7 - 3.1 x10E3/uL   Monocytes Absolute 0.8 0.1 - 0.9 x10E3/uL   EOS (ABSOLUTE) 0.3 0.0 - 0.4 x10E3/uL   Basophils Absolute 0.0 0.0 - 0.2 x10E3/uL   Immature Granulocytes 1 Not Estab. %   Immature Grans (Abs) 0.1 0.0 - 0.1 x10E3/uL  Comprehensive metabolic panel  Result Value Ref Range   Glucose 107 (H) 65 - 99 mg/dL   BUN 19 8 - 27 mg/dL   Creatinine, Ser 0.95 0.57 - 1.00 mg/dL   GFR calc non Af Amer 59 (L) >59 mL/min/1.73   GFR calc Af Amer 68 >59 mL/min/1.73   BUN/Creatinine Ratio 20 12 - 28   Sodium 143 134 - 144 mmol/L   Potassium 4.1 3.5 - 5.2 mmol/L   Chloride 105 96 - 106 mmol/L   CO2 23 20 - 29 mmol/L   Calcium 9.4 8.7 - 10.3 mg/dL   Total Protein 6.9 6.0 - 8.5 g/dL   Albumin 4.5 3.5 - 4.8 g/dL   Globulin, Total 2.4 1.5 - 4.5 g/dL   Albumin/Globulin Ratio 1.9 1.2 - 2.2   Bilirubin Total 0.2 0.0 - 1.2 mg/dL   Alkaline Phosphatase 72 39 - 117 IU/L   AST 17 0 -  40 IU/L   ALT 18 0 - 32 IU/L  Microalbumin, Urine Waived  Result Value Ref Range   Microalb, Ur Waived 80 (H) 0 - 19 mg/L   Creatinine, Urine Waived 200 10 - 300 mg/dL   Microalb/Creat Ratio 30-300 (H) <30 mg/g  TSH  Result Value Ref Range   TSH 1.670 0.450 - 4.500 uIU/mL  UA/M w/rflx Culture,  Routine  Result Value Ref Range   Specific Gravity, UA 1.025 1.005 - 1.030   pH, UA 5.5 5.0 - 7.5   Color, UA Yellow Yellow   Appearance Ur Cloudy (A) Clear   Leukocytes, UA Negative Negative   Protein, UA Trace (A) Negative/Trace   Glucose, UA Negative Negative   Ketones, UA Negative Negative   RBC, UA Trace (A) Negative   Bilirubin, UA Negative Negative   Urobilinogen, Ur 1.0 0.2 - 1.0 mg/dL   Nitrite, UA Negative Negative   Microscopic Examination See below:   VITAMIN D 25 Hydroxy (Vit-D Deficiency, Fractures)  Result Value Ref Range   Vit D, 25-Hydroxy 17.5 (L) 30.0 - 100.0 ng/mL      Assessment & Plan:   Problem List Items Addressed This Visit      Other   Anxiety    Not herself today, very slowed. Concerned that this is from the gabapentin as she is not taking the tramadol often. Will change from zoloft to cymbalta and recheck 2 weeks. If not improved, will need to see neurology or geri-psych.       Relevant Medications   DULoxetine (CYMBALTA) 60 MG capsule   Depression    Not herself today, very slowed. Concerned that this is from the gabapentin as she is not taking the tramadol often. Will change from zoloft to cymbalta and recheck 2 weeks. If not improved, will need to see neurology or geri-psych.       Relevant Medications   DULoxetine (CYMBALTA) 60 MG capsule   Spinal stenosis of lumbar region    Did better with PT, but cannot leave the house. Will get home PT in. Referral generated.        Other Visit Diagnoses    Impaired orientation    -  Primary   New- seems slowed today, likely from gabapentin. Will stop and start cymbalta for pain. Will get home PT due to  MS change, check labs and recheck 2 weeks.    Relevant Orders   CBC with Differential/Platelet   Bayer DCA Hb A1c Waived   Comprehensive metabolic panel   Thyroid Panel With TSH   Edema, unspecified type       Checking labs. Rx for compression stockings given. If labs OK, consider low dose fluid pill.    Relevant Orders   CBC with Differential/Platelet   Bayer DCA Hb A1c Waived   Comprehensive metabolic panel   Thyroid Panel With TSH   Paresthesias       Checking labs today. Changing from zoloft to cymbalta and stopping gabapentin. Await results.    Relevant Orders   CBC with Differential/Platelet   Bayer DCA Hb A1c Waived   Comprehensive metabolic panel   Thyroid Panel With TSH   Diaphoresis       Relevant Orders   EKG 12-Lead (Completed)       Follow up plan: Return in about 2 weeks (around 01/01/2018).   12/18/17: 2:37PM- on the way out of the office, patient fell into the wall. Denies any dizziness. Legs very weak. She was diaphoretic and confused when we brought her back into the room. EKG done- NSR at 89bpm, no ST segment changes. Almost fell several times. Tearful and concerned. Unclear what's going on- may still be side effects from gabapentin, but will send her to there ER for evaluation. Her husband will come pick her up at her request.

## 2017-12-18 NOTE — ED Provider Notes (Signed)
[facility] Emergency Department Provider Note ____________________________________________   First MD Initiated Contact with Patient 12/18/17 1549     (approximate)  I have reviewed the triage vital signs and the nursing notes.   HISTORY  Chief Complaint Code Stroke    HPI Cynthia Vaughan is a 77 y.o. female with PMH as noted below who presents for work concern of acute stroke, but specifically with gait disturbance and generalized weakness.  Patient states she has been feeling somewhat weak over the last few days, and then today found herself staggering and having difficulty walking.  She went to her primary care doctor's office and when she was noted to be staggering, she was sent to the hospital for evaluation for possible stroke.  There was also concern for possible facial droop as well.   Past Medical History:  Diagnosis Date  . Allergy   . Anxiety   . Cancer ([place])    SKIN  . CKD (chronic kidney disease) stage 2, GFR 60-89 ml/min   . Depression   . Edema    LEGS/FEET  . GERD (gastroesophageal reflux disease)   . Heart murmur   . Hyperlipidemia   . Hypertension   . Hypopotassemia   . Microalbuminuria   . Osteoarthritis   . Osteopenia   . 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 Urinary incontinence   . Vitamin D deficiency disease   . Wheezing     Patient Active Problem List   Diagnosis Date Noted  . Degenerative joint disease (DJD) of lumbar spine 10/27/2017  . Spinal stenosis of lumbar region 10/27/2017  . Rotator cuff impingement syndrome of right shoulder 12/08/2015  . Wrist pain, left 07/07/2015  . NSAID long-term use 07/07/2015  . Anxiety   . Depression   . Hyperlipidemia   . Benign hypertensive renal disease   . Osteoarthritis   . Urinary incontinence   . Osteopenia   . Vitamin D deficiency disease   . Microalbuminuria   . CKD (chronic kidney disease) stage 2, GFR 60-89 ml/min     Past Surgical History:    Procedure Laterality Date  . ABDOMINAL HYSTERECTOMY  2004   Total (due to bladder surgery)  . BLADDER SURGERY     x 2  . BUNIONECTOMY    . CATARACT EXTRACTION W/PHACO Right 11/23/2015   Procedure: CATARACT EXTRACTION PHACO AND INTRAOCULAR LENS PLACEMENT (IOC);  Surgeon: Estill Cotta, MD;  Location: [facility] ORS;  Service: Ophthalmology;  Laterality: Right;  [place]    1:30.9 AP%  26.9 CDE   42.29 flud casette lot # [id number]  exp05/31/2018  . COLONOSCOPY      Prior to Admission medications   Medication Sig Start Date End Date Taking? Authorizing Provider  albuterol (PROAIR HFA) 108 (90 Base) MCG/ACT inhaler INHALE 2 PUFFS INTO THE LUNGS EVERY 6 HOURS AS NEEDED FOR WHEEZING OR SHORTNESS OF BREATH 07/24/17  Yes Johnson, Megan P, DO  amLODipine (NORVASC) 10 MG tablet Take 1 tablet (10 mg total) by mouth daily. 11/28/17  Yes Johnson, Megan P, DO  ARIPiprazole (ABILIFY) 5 MG tablet Take 1 tablet (5 mg total) by mouth daily. 11/28/17  Yes Johnson, Megan P, DO  aspirin EC 81 MG tablet Take 81 mg by mouth daily.   Yes [provider]  atorvastatin (LIPITOR) 40 MG tablet Take 1 tablet (40 mg total) by mouth at bedtime. 11/28/17  Yes Johnson, Megan P, DO  busPIRone (BUSPAR) 15 MG tablet Take 1 tablet (15 mg  total) by mouth 3 (three) times daily. 11/28/17  Yes Johnson, Megan P, DO  Cholecalciferol (VITAMIN D-3) 1000 units CAPS Take 1 capsule (1,000 Units total) by mouth daily. 11/28/17  Yes Johnson, Megan P, DO  fluticasone (FLONASE) 50 MCG/ACT nasal spray Place 2 sprays into both nostrils daily. 11/28/17  Yes Johnson, Megan P, DO  loratadine (CLARITIN) 10 MG tablet Take 1 tablet (10 mg total) by mouth daily. 11/28/17  Yes Johnson, Megan P, DO  losartan (COZAAR) 50 MG tablet Take 1 tablet (50 mg total) by mouth daily. 11/28/17  Yes Johnson, Megan P, DO  naproxen (NAPROSYN) 500 MG tablet TAKE 1 TABLET(500 MG) BY MOUTH TWICE DAILY WITH A MEAL 11/28/17  Yes Johnson, Megan P, DO  omeprazole (PRILOSEC) 20 MG capsule  Take 1 capsule (20 mg total) by mouth daily. 11/28/17  Yes Johnson, Megan P, DO  ranitidine (ZANTAC) 150 MG tablet Take 2 tablets (300 mg total) by mouth every evening. 11/28/17  Yes Johnson, Megan P, DO  traMADol (ULTRAM) 50 MG tablet Take 1 tablet (50 mg total) by mouth every 8 (eight) hours as needed. 11/09/17  Yes Johnson, Megan P, DO  Vitamin D, Ergocalciferol, (DRISDOL) 50000 units CAPS capsule Take 1 capsule (50,000 Units total) by mouth every 7 (seven) days. 12/14/17  Yes Johnson, Megan P, DO  DULoxetine (CYMBALTA) 60 MG capsule Take 1 capsule (60 mg total) by mouth daily. Patient not taking: Reported on 12/18/2017 12/18/17   Park Liter P, DO    Allergies Codeine and Lisinopril  Family History  Problem Relation Age of Onset  . AAA (abdominal aortic aneurysm) Mother   . Arthritis Brother   . Heart disease Brother   . Stroke Daughter   . Diabetes Son   . Stroke Son   . Seizures Son   . Diabetes Brother   . Heart disease Brother        7 total heart attacks  . Hyperlipidemia Brother   . Hypertension Brother   . Dementia Brother   . AAA (abdominal aortic aneurysm) Brother   . Stroke Son   . Heart disease Brother   . Dementia Sister     Social History Social History   Tobacco Use  . Smoking status: Former Research scientist (life sciences)  . Smokeless tobacco: Never Used  . Tobacco comment: quit 20 years ago   Substance Use Topics  . Alcohol use: Yes    Comment: OCCAS  . Drug use: No    Review of Systems  Constitutional: No fever.  Positive for generalized weakness. Eyes: No visual changes. ENT: No sore throat. Cardiovascular: Denies chest pain. Respiratory: Denies shortness of breath. Gastrointestinal: No nausea, no vomiting.  No diarrhea.  Genitourinary: Negative for dysuria.  Musculoskeletal: Negative for back pain. Skin: Negative for rash. Neurological: Negative for headache.    ____________________________________________   PHYSICAL EXAM:  VITAL SIGNS: ED Triage Vitals  Enc  Vitals Group     BP 12/18/17 1547 (!) 130/118     Pulse Rate 12/18/17 1547 68     Resp 12/18/17 1547 17     Temp 12/18/17 1547 98 F (36.7 C)     Temp Source 12/18/17 1547 Oral     SpO2 12/18/17 1547 96 %     Weight 12/18/17 1548 140 lb (63.5 kg)     Height 12/18/17 1548 4\' 11"  (1.499 m)     Head Circumference --      Peak Flow --      Pain Score 12/18/17 1547  3     Pain Loc --      Pain Edu? --      Excl. in [place]? --     Constitutional: Alert and oriented.  Slightly weak appearing but in no acute distress. Eyes: Conjunctivae are normal.  EOMI.  PERRLA. Head: Atraumatic.  Possible minimal left facial droop at rest but resolves with smiling or grimacing. Nose: No congestion/rhinnorhea. Mouth/Throat: Mucous membranes are moist.   Neck: Normal range of motion.  Cardiovascular: Normal rate, regular rhythm. Grossly normal heart sounds.  Good peripheral circulation. Respiratory: Normal respiratory effort.  No retractions. Lungs CTAB. Gastrointestinal: Soft and nontender. No distention.  Genitourinary: No flank tenderness. Musculoskeletal: No lower extremity edema.  Extremities warm and well perfused.  Neurologic: Motor and sensory intact in all extremities.  Normal coordination.  No gross focal neurologic deficits are appreciated.  Skin:  Skin is warm and dry. No rash noted. Psychiatric: Mood and affect are normal. Speech and behavior are normal.  ____________________________________________   LABS (all labs ordered are listed, but only abnormal results are displayed)  Labs Reviewed  CBC - Abnormal; Notable for the following components:      Result Value   WBC 11.7 (*)    All other components within normal limits  DIFFERENTIAL - Abnormal; Notable for the following components:   Neutro Abs 9.0 (*)    All other components within normal limits  COMPREHENSIVE METABOLIC PANEL - Abnormal; Notable for the following components:   Potassium 3.3 (*)    Glucose, Bld 141 (*)    All other  components within normal limits  GLUCOSE, CAPILLARY - Abnormal; Notable for the following components:   Glucose-Capillary 124 (*)    All other components within normal limits  PROTIME-INR  APTT  TROPONIN I  URINALYSIS, COMPLETE (UACMP) WITH MICROSCOPIC  CBG MONITORING, ED   ____________________________________________  EKG  ED ECG REPORT I, Arta Silence, the attending physician, personally viewed and interpreted this ECG.  Date: 12/18/2017 EKG Time: 1602 Rate: 80 Rhythm: normal sinus rhythm QRS Axis: normal Intervals: normal ST/T Wave abnormalities: normal Narrative Interpretation: no evidence of acute ischemia  ____________________________________________  RADIOLOGY  CT code stroke: No ICH or findings of acute stroke  ____________________________________________   PROCEDURES  Procedure(s) performed: No  Procedures  Critical Care performed: No ____________________________________________   INITIAL IMPRESSION / ASSESSMENT AND PLAN / ED COURSE  Pertinent labs & imaging results that were available during my care of the patient were reviewed by me and considered in my medical decision making (see chart for details).  77 year old female with PMH as noted above presents with generalized weakness, gait disturbance, and concern for possible acute stroke.  She reports feeling weak for a few days, and then was "staggering" at her doctor's office today, prompting them to send her to the emergency department for stroke evaluation.  On exam, the patient is comfortable appearing, vital signs are normal except for slight hypertension, and the remainder of the exam is as described above.  Patient initially appeared as though she might have a mild left facial droop, however when she smiles or grimaces this disappears.  She otherwise has no neuro deficits.  Patient was sent for emergent CT which was negative.  She was evaluated by tele-neurology specialist Dr.Abston.  I  consulted with him and he advised that patient's NIH stroke scale was 1.  He advised that he did not recommend TPA.  Besides CVA/TIA, differential also includes other causes of generalized weakness including UTI or other  infection, dehydration or other metabolic etiology, or less likely cardiac cause.  Plan for work-up for these causes, and admission for further stroke evaluation.    ----------------------------------------- 6:38 PM on 12/18/2017 -----------------------------------------  Work-up is unremarkable so far.  We are still awaiting UA.  I signed the patient out for admission to the hospitalist Dr. Anselm Jungling.  ____________________________________________   FINAL CLINICAL IMPRESSION(S) / ED DIAGNOSES  Final diagnoses:  Weakness  Gait disturbance      NEW MEDICATIONS STARTED DURING THIS VISIT:  New Prescriptions   No medications on file     Note:  This document was prepared using Dragon voice recognition software and may include unintentional dictation errors.    Arta Silence, MD 12/18/17 Bosie Helper

## 2017-12-18 NOTE — Progress Notes (Signed)
CODE STROKE- PHARMACY COMMUNICATION   Time CODE STROKE called/page received:1549  Time response to CODE STROKE was made (in person or via phone): 1549  Time Stroke Kit retrieved from [place] (only if needed): patient not a candidate for TPA  Name of Provider/Nurse contacted: Lorrie, busy  Past Medical History:  Diagnosis Date  . Allergy   . Anxiety   . Cancer ([place])    SKIN  . CKD (chronic kidney disease) stage 2, GFR 60-89 ml/min   . Depression   . Edema    LEGS/FEET  . GERD (gastroesophageal reflux disease)   . Heart murmur   . Hyperlipidemia   . Hypertension   . Hypopotassemia   . Microalbuminuria   . Osteoarthritis   . Osteopenia   . 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 Urinary incontinence   . Vitamin D deficiency disease   . Wheezing    Prior to Admission medications   Medication Sig Start Date End Date Taking? Authorizing Provider  albuterol (PROAIR HFA) 108 (90 Base) MCG/ACT inhaler INHALE 2 PUFFS INTO THE LUNGS EVERY 6 HOURS AS NEEDED FOR WHEEZING OR SHORTNESS OF BREATH 07/24/17   Johnson, Megan P, DO  amLODipine (NORVASC) 10 MG tablet Take 1 tablet (10 mg total) by mouth daily. 11/28/17   Johnson, Megan P, DO  ARIPiprazole (ABILIFY) 5 MG tablet Take 1 tablet (5 mg total) by mouth daily. 11/28/17   Park Liter P, DO  aspirin EC 81 MG tablet Take 81 mg by mouth daily.    [provider]  atorvastatin (LIPITOR) 40 MG tablet Take 1 tablet (40 mg total) by mouth at bedtime. 11/28/17   Johnson, Megan P, DO  busPIRone (BUSPAR) 15 MG tablet Take 1 tablet (15 mg total) by mouth 3 (three) times daily. 11/28/17   Johnson, Megan P, DO  Cholecalciferol (VITAMIN D-3) 1000 units CAPS Take 1 capsule (1,000 Units total) by mouth daily. 11/28/17   Johnson, Megan P, DO  DULoxetine (CYMBALTA) 60 MG capsule Take 1 capsule (60 mg total) by mouth daily. 12/18/17   Johnson, Megan P, DO  fluticasone (FLONASE) 50 MCG/ACT nasal spray Place 2 sprays into both nostrils daily. 11/28/17    Johnson, Megan P, DO  loratadine (CLARITIN) 10 MG tablet Take 1 tablet (10 mg total) by mouth daily. 11/28/17   Johnson, Megan P, DO  losartan (COZAAR) 50 MG tablet Take 1 tablet (50 mg total) by mouth daily. 11/28/17   Johnson, Megan P, DO  naproxen (NAPROSYN) 500 MG tablet TAKE 1 TABLET(500 MG) BY MOUTH TWICE DAILY WITH A MEAL 11/28/17   Johnson, Megan P, DO  omeprazole (PRILOSEC) 20 MG capsule Take 1 capsule (20 mg total) by mouth daily. 11/28/17   Johnson, Megan P, DO  ranitidine (ZANTAC) 150 MG tablet Take 2 tablets (300 mg total) by mouth every evening. 11/28/17   Johnson, Megan P, DO  traMADol (ULTRAM) 50 MG tablet Take 1 tablet (50 mg total) by mouth every 8 (eight) hours as needed. 11/09/17   Park Liter P, DO  Vitamin D, Ergocalciferol, (DRISDOL) 50000 units CAPS capsule Take 1 capsule (50,000 Units total) by mouth every 7 (seven) days. 12/14/17   Johnson, Megan P, DO  gabapentin (NEURONTIN) 100 MG capsule Take 2 capsules (200 mg total) by mouth 3 (three) times daily. Start with 2 tablet at night.  May increase to three times/day as tolerated 12/08/17 12/18/17  Kathrine Haddock, NP  sertraline (ZOLOFT) 100 MG tablet Take 2 tablets (200 mg total) by  mouth daily. 11/28/17 12/18/17  [name], DO    Napoleon Form ,PharmD Clinical Pharmacist  12/18/2017  4:26 PM

## 2017-12-19 ENCOUNTER — Observation Stay: Payer: Medicare Other

## 2017-12-19 ENCOUNTER — Observation Stay (HOSPITAL_BASED_OUTPATIENT_CLINIC_OR_DEPARTMENT_OTHER)
Admit: 2017-12-19 | Discharge: 2017-12-19 | Disposition: A | Discharge status: Home / Self Care | Payer: Medicare Other | Attending: Internal Medicine | Admitting: Internal Medicine

## 2017-12-19 DIAGNOSIS — K219 Gastro-esophageal reflux disease without esophagitis: Secondary | ICD-10-CM | POA: Diagnosis not present

## 2017-12-19 DIAGNOSIS — E785 Hyperlipidemia, unspecified: Secondary | ICD-10-CM | POA: Diagnosis not present

## 2017-12-19 DIAGNOSIS — R4781 Slurred speech: Secondary | ICD-10-CM | POA: Diagnosis not present

## 2017-12-19 DIAGNOSIS — I5030 Unspecified diastolic (congestive) heart failure: Secondary | ICD-10-CM

## 2017-12-19 DIAGNOSIS — I10 Essential (primary) hypertension: Secondary | ICD-10-CM | POA: Diagnosis not present

## 2017-12-19 DIAGNOSIS — G459 Transient cerebral ischemic attack, unspecified: Secondary | ICD-10-CM

## 2017-12-19 DIAGNOSIS — I63233 Cerebral infarction due to unspecified occlusion or stenosis of bilateral carotid arteries: Secondary | ICD-10-CM | POA: Diagnosis not present

## 2017-12-19 LAB — THYROID PANEL WITH TSH
FREE THYROXINE INDEX: 1.3 (ref 1.2–4.9)
T3 Uptake Ratio: 21 % — ABNORMAL LOW (ref 24–39)
T4, Total: 6.4 ug/dL (ref 4.5–12.0)
TSH: 2.47 u[IU]/mL (ref 0.450–4.500)

## 2017-12-19 LAB — HEMOGLOBIN A1C
HEMOGLOBIN A1C: 5.5 % (ref 4.8–5.6)
MEAN PLASMA GLUCOSE: 111.15 mg/dL

## 2017-12-19 LAB — CBC WITH DIFFERENTIAL/PLATELET
BASOS ABS: 0 10*3/uL (ref 0.0–0.2)
Basos: 0 %
EOS (ABSOLUTE): 0.2 10*3/uL (ref 0.0–0.4)
Eos: 2 %
Hematocrit: 41.5 % (ref 34.0–46.6)
Hemoglobin: 13.4 g/dL (ref 11.1–15.9)
IMMATURE GRANS (ABS): 0 10*3/uL (ref 0.0–0.1)
Immature Granulocytes: 0 %
LYMPHS: 24 %
Lymphocytes Absolute: 2.2 10*3/uL (ref 0.7–3.1)
MCH: 30.5 pg (ref 26.6–33.0)
MCHC: 32.3 g/dL (ref 31.5–35.7)
MCV: 95 fL (ref 79–97)
Monocytes Absolute: 1 10*3/uL — ABNORMAL HIGH (ref 0.1–0.9)
Monocytes: 11 %
NEUTROS ABS: 5.8 10*3/uL (ref 1.4–7.0)
Neutrophils: 63 %
Platelets: 271 10*3/uL (ref 150–379)
RBC: 4.39 x10E6/uL (ref 3.77–5.28)
RDW: 12.8 % (ref 12.3–15.4)
WBC: 9.2 10*3/uL (ref 3.4–10.8)

## 2017-12-19 LAB — ECHOCARDIOGRAM COMPLETE
Height: 59 in
Weight: 2336 oz

## 2017-12-19 LAB — LIPID PANEL
CHOL/HDL RATIO: 2.8 ratio
CHOLESTEROL: 170 mg/dL (ref 0–200)
HDL: 60 mg/dL (ref >40)
LDL Cholesterol: 98 mg/dL (ref 0–99)
TRIGLYCERIDES: 60 mg/dL (ref <150)
VLDL: 12 mg/dL (ref 0–40)

## 2017-12-19 LAB — COMPREHENSIVE METABOLIC PANEL
ALBUMIN: 4.2 g/dL (ref 3.5–4.8)
ALT: 23 IU/L (ref 0–32)
AST: 20 IU/L (ref 0–40)
Albumin/Globulin Ratio: 1.6 (ref 1.2–2.2)
Alkaline Phosphatase: 82 IU/L (ref 39–117)
BILIRUBIN TOTAL: 0.3 mg/dL (ref 0.0–1.2)
BUN / CREAT RATIO: 13 (ref 12–28)
BUN: 10 mg/dL (ref 8–27)
CHLORIDE: 106 mmol/L (ref 96–106)
CO2: 24 mmol/L (ref 20–29)
CREATININE: 0.79 mg/dL (ref 0.57–1.00)
Calcium: 9.2 mg/dL (ref 8.7–10.3)
GFR calc non Af Amer: 73 mL/min/{1.73_m2} (ref >59)
GFR, EST AFRICAN AMERICAN: 85 mL/min/{1.73_m2} (ref >59)
GLUCOSE: 87 mg/dL (ref 65–99)
Globulin, Total: 2.6 g/dL (ref 1.5–4.5)
Potassium: 3.6 mmol/L (ref 3.5–5.2)
Sodium: 146 mmol/L — ABNORMAL HIGH (ref 134–144)
TOTAL PROTEIN: 6.8 g/dL (ref 6.0–8.5)

## 2017-12-19 MED ORDER — POTASSIUM CHLORIDE CRYS ER 20 MEQ PO TBCR
40.0000 meq | EXTENDED_RELEASE_TABLET | Freq: Once | ORAL | Status: AC
  Administered 2017-12-19: 15:00:00 40 meq via ORAL
  Filled 2017-12-19: qty 2

## 2017-12-19 NOTE — Progress Notes (Signed)
OT Screen Note  Patient Details Name: Cynthia Vaughan MRN: [id number] DOB: 07-Jun-1941   Cancelled Treatment:    Reason Eval/Treat Not Completed: OT screened, no needs identified, will sign off. Order received, chart reviewed. Pt back to baseline functional independence. No skilled OT needs identified. Will sign off. Please re-consult if additional needs arise.  Jeni Salles, MPH, MS, OTR/L ascom [phone] 12/19/17, 11:42 AM

## 2017-12-19 NOTE — Progress Notes (Signed)
   12/19/17 1000  Clinical Encounter Type  Visited With Patient and family together  Visit Type Initial  Referral From Nurse  Consult/Referral To Chaplain  Spiritual Encounters  Spiritual Needs Emotional   CH followed up with PT that [facility] meet yesterday in the ED. PT was in good spirits and said she was ready to go home. PT's husband was with PT. CH will follow up as needed.

## 2017-12-19 NOTE — Plan of Care (Signed)
  Problem: Education: Goal: Knowledge of disease or condition will improve Outcome: Progressing Goal: Knowledge of secondary prevention will improve Outcome: Progressing Goal: Knowledge of patient specific risk factors addressed and post discharge goals established will improve Outcome: Progressing   Problem: Coping: Goal: Will verbalize positive feelings about self Outcome: Progressing Goal: Will identify appropriate support needs Outcome: Progressing   Problem: Health Behavior/Discharge Planning: Goal: Ability to manage health-related needs will improve Outcome: Progressing   Problem: Self-Care: Goal: Ability to participate in self-care as condition permits will improve Outcome: Progressing Goal: Verbalization of feelings and concerns over difficulty with self-care will improve Outcome: Progressing Goal: Ability to communicate needs accurately will improve Outcome: Progressing   Problem: Ischemic Stroke/TIA Tissue Perfusion: Goal: Complications of ischemic stroke/TIA will be minimized Outcome: Progressing   Problem: Clinical Measurements: Goal: Ability to maintain clinical measurements within normal limits will improve Outcome: Progressing Goal: Will remain free from infection Outcome: Progressing Goal: Diagnostic test results will improve Outcome: Progressing Goal: Respiratory complications will improve Outcome: Progressing Goal: Cardiovascular complication will be avoided Outcome: Progressing   Problem: Activity: Goal: Risk for activity intolerance will decrease Outcome: Progressing   Problem: Pain Managment: Goal: General experience of comfort will improve Outcome: Progressing   Problem: Safety: Goal: Ability to remain free from injury will improve Outcome: Progressing

## 2017-12-19 NOTE — Discharge Summary (Signed)
[place] [facility] [place] NAME: Cynthia Vaughan    MR#:  [id number]  DATE OF BIRTH:  1940/09/08  DATE OF ADMISSION:  12/18/2017   ADMITTING PHYSICIAN: Vaughan Basta, MD  DATE OF DISCHARGE: No discharge date for patient encounter.  PRIMARY CARE PHYSICIAN: Park Liter P, DO   ADMISSION DIAGNOSIS:  Gait disturbance [R26.9] Weakness [R53.1] DISCHARGE DIAGNOSIS:  Principal Problem:   TIA (transient ischemic attack)  SECONDARY DIAGNOSIS:   Past Medical History:  Diagnosis Date  . Allergy   . Anxiety   . Cancer ([place])    SKIN  . CKD (chronic kidney disease) stage 2, GFR 60-89 ml/min   . Depression   . Edema    LEGS/FEET  . GERD (gastroesophageal reflux disease)   . Heart murmur   . Hyperlipidemia   . Hypertension   . Hypopotassemia   . Microalbuminuria   . Osteoarthritis   . Osteopenia   . 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 Urinary incontinence   . Vitamin D deficiency disease   . Wheezing    HOSPITAL COURSE:   * TIA   MRI and MRA of brain: No CVA.   Echocardiogram and carotid Doppler study pending.   Continue aspirin and Lipitor.   No further neuro work-up and the patient can be discharged home per Dr. Irish Elders  * hypertension   Continue amlodipine, losartan  * hyperlipidemia   Continue atorvastatin.  * gastroesophageal reflux disease   Continue PPI.  I discussed with Dr. Irish Elders DISCHARGE CONDITIONS:  Stable, discharge to home today. CONSULTS OBTAINED:  Treatment Team:  Leotis Pain, MD DRUG ALLERGIES:   Allergies  Allergen Reactions  . Codeine Itching  . Lisinopril Cough   DISCHARGE MEDICATIONS:   Allergies as of 12/19/2017      Reactions   Codeine Itching   Lisinopril Cough      Medication List    STOP taking these medications   naproxen 500 MG tablet Commonly known as:  NAPROSYN     TAKE these medications   albuterol 108 (90 Base) MCG/ACT inhaler Commonly  known as:  PROAIR HFA INHALE 2 PUFFS INTO THE LUNGS EVERY 6 HOURS AS NEEDED FOR WHEEZING OR SHORTNESS OF BREATH   amLODipine 10 MG tablet Commonly known as:  NORVASC Take 1 tablet (10 mg total) by mouth daily.   ARIPiprazole 5 MG tablet Commonly known as:  ABILIFY Take 1 tablet (5 mg total) by mouth daily.   aspirin EC 81 MG tablet Take 81 mg by mouth daily.   atorvastatin 40 MG tablet Commonly known as:  LIPITOR Take 1 tablet (40 mg total) by mouth at bedtime.   busPIRone 15 MG tablet Commonly known as:  BUSPAR Take 1 tablet (15 mg total) by mouth 3 (three) times daily.   DULoxetine 60 MG capsule Commonly known as:  CYMBALTA Take 1 capsule (60 mg total) by mouth daily.   fluticasone 50 MCG/ACT nasal spray Commonly known as:  FLONASE Place 2 sprays into both nostrils daily.   loratadine 10 MG tablet Commonly known as:  CLARITIN Take 1 tablet (10 mg total) by mouth daily.   losartan 50 MG tablet Commonly known as:  COZAAR Take 1 tablet (50 mg total) by mouth daily.   omeprazole 20 MG capsule Commonly known as:  PRILOSEC Take 1 capsule (20 mg total) by mouth daily.   ranitidine 150 MG tablet Commonly known as:  ZANTAC Take 2 tablets (300 mg total)  by mouth every evening.   traMADol 50 MG tablet Commonly known as:  ULTRAM Take 1 tablet (50 mg total) by mouth every 8 (eight) hours as needed.   Vitamin D (Ergocalciferol) 50000 units Caps capsule Commonly known as:  DRISDOL Take 1 capsule (50,000 Units total) by mouth every 7 (seven) days.   Vitamin D-3 1000 units Caps Take 1 capsule (1,000 Units total) by mouth daily.        DISCHARGE INSTRUCTIONS:  See AVS. If you experience worsening of your admission symptoms, develop shortness of breath, life threatening emergency, suicidal or homicidal thoughts you must seek medical attention immediately by calling 911 or calling your MD immediately  if symptoms less severe.  You Must read complete  instructions/literature along with all the possible adverse reactions/side effects for all the Medicines you take and that have been prescribed to you. Take any new Medicines after you have completely understood and accpet all the possible adverse reactions/side effects.   Please note  You were cared for by a hospitalist during your hospital stay. If you have any questions about your discharge medications or the care you received while you were in the hospital after you are discharged, you can call the unit and asked to speak with the hospitalist on call if the hospitalist that took care of you is not available. Once you are discharged, your primary care physician will handle any further medical issues. Please note that NO REFILLS for any discharge medications will be authorized once you are discharged, as it is imperative that you return to your primary care physician (or establish a relationship with a primary care physician if you do not have one) for your aftercare needs so that they can reassess your need for medications and monitor your lab values.    On the day of Discharge:  VITAL SIGNS:  Blood pressure 126/81, pulse 80, temperature 97.6 F (36.4 C), temperature source Oral, resp. rate 18, height 4\' 11"  (1.499 m), weight 146 lb (66.2 kg), SpO2 93 %. PHYSICAL EXAMINATION:  GENERAL:  77 y.o.-year-old patient lying in the bed with no acute distress.  EYES: Pupils equal, round, reactive to light and accommodation. No scleral icterus. Extraocular muscles intact.  HEENT: Head atraumatic, normocephalic. Oropharynx and nasopharynx clear.  NECK:  Supple, no jugular venous distention. No thyroid enlargement, no tenderness.  LUNGS: Normal breath sounds bilaterally, no wheezing, rales,rhonchi or crepitation. No use of accessory muscles of respiration.  CARDIOVASCULAR: S1, S2 normal. No murmurs, rubs, or gallops.  ABDOMEN: Soft, non-tender, non-distended. Bowel sounds present. No organomegaly or mass.    EXTREMITIES: No pedal edema, cyanosis, or clubbing.  NEUROLOGIC: Cranial nerves II through XII are intact. Muscle strength 5/5 in all extremities. Sensation intact. Gait not checked.  PSYCHIATRIC: The patient is alert and oriented x 3.  SKIN: No obvious rash, lesion, or ulcer.  DATA REVIEW:   CBC Recent Labs  Lab 12/18/17 1559  WBC 11.7*  HGB 14.2  HCT 42.5  PLT 231    Chemistries  Recent Labs  Lab 12/18/17 1559  NA 139  K 3.3*  CL 103  CO2 26  GLUCOSE 141*  BUN 11  CREATININE 0.70  CALCIUM 9.2  AST 27  ALT 25  ALKPHOS 77  BILITOT 0.4     Microbiology Results  Results for orders placed or performed in visit on 09/12/17  Microscopic Examination     Status: Abnormal   Collection Time: 09/12/17  3:35 PM  Result Value Ref Range Status  WBC, UA 0-5 0 - 5 /hpf Final   RBC, UA 0-2 0 - 2 /hpf Final   Epithelial Cells (non renal) >10 (A) 0 - 10 /hpf Final   Renal Epithel, UA 0-10 (A) None seen /hpf Final   Bacteria, UA Few None seen/Few Final    RADIOLOGY:  Dg Chest 2 View  Result Date: 12/18/2017 CLINICAL DATA:  Weakness and stroke-like symptoms today. EXAM: CHEST - 2 VIEW COMPARISON:  12/18/2017 FINDINGS: Shallow inspiration. Heart size and pulmonary vascularity are normal for technique. Slight interstitial pattern to the lungs with some peribronchial thickening likely representing chronic bronchitis. No airspace disease or consolidation. No blunting of costophrenic angles. No pneumothorax. Mediastinal contours appear intact. Degenerative changes in the spine. IMPRESSION: Chronic bronchitic changes in the lungs. No evidence of active pulmonary disease. Electronically Signed   By: Lucienne Capers M.D.   On: 12/18/2017 21:33   Mr Brain Wo Contrast  Result Date: 12/19/2017 CLINICAL DATA:  Initial evaluation for acute weakness, slurred speech, facial weakness. EXAM: MRI HEAD WITHOUT CONTRAST MRA HEAD WITHOUT CONTRAST TECHNIQUE: Multiplanar, multiecho pulse sequences of  the brain and surrounding structures were obtained without intravenous contrast. Angiographic images of the head were obtained using MRA technique without contrast. COMPARISON:  Prior CT from earlier the same day. FINDINGS: MRI HEAD FINDINGS Brain: Diffuse prominence of the CSF containing spaces compatible generalized age-related cerebral atrophy. Patchy and confluent T2/FLAIR hyperintensity within the periventricular and deep white matter both cerebral hemispheres consistent with chronic small vessel ischemic disease, moderate in nature. 11 mm cystic lucency within the subcortical white matter of the left parietal lobe follows CSF signal on all pulse sequences, felt to be most consistent with a dilated perivascular space. No abnormal foci of restricted diffusion to suggest acute or subacute ischemia. Gray-white matter differentiation maintained. No areas of remote cortical infarction. No foci of susceptibility artifact to suggest acute or chronic intracranial hemorrhage. No mass lesion, midline shift or mass effect. No hydrocephalus. No extra-axial fluid collection. Major dural sinuses are grossly patent. Pituitary gland suprasellar region normal. Midline structures intact and normal. Vascular: Major intracranial vascular flow voids are maintained. Skull and upper cervical spine: Craniocervical junction normal. 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 Upper cervical spine otherwise unremarkable. Bone marrow signal intensity within normal limits. No scalp soft tissue abnormality. Sinuses/Orbits: Globes and orbital soft tissues within normal limits. Patient status post lens extraction on the right. Paranasal sinuses are clear. No mastoid effusion. Inner ear structures normal. Other: None MRA HEAD FINDINGS ANTERIOR CIRCULATION: Distal cervical segments of the internal carotid arteries are widely patent with antegrade flow. Petrous, cavernous, and supraclinoid segments patent without  flow-limiting stenosis. Left A1 segment widely patent. Right A1 hypoplastic but patent as well. Anterior communicating artery normal. Anterior cerebral arteries patent to their distal aspects without flow-limiting stenosis. M1 segments patent without stenosis. Normal MCA bifurcations. No proximal M2 occlusion. Distal MCA branches well perfused and symmetric. POSTERIOR CIRCULATION: Vertebral arteries code dominant and patent to the vertebrobasilar junction without stenosis posterior inferior cerebral arteries patent bilaterally. Basilar artery patent to its distal aspect without stenosis. Superior cerebral arteries patent bilaterally. Minimal atheromatous irregularity within the PCAs bilaterally without stenosis. PCAs patent to their distal aspects No aneurysm. IMPRESSION: MRI HEAD IMPRESSION 1. No acute intracranial abnormality. 2. Age-related cerebral atrophy with mild to moderate chronic small vessel ischemic disease. MRA HEAD IMPRESSION Negative intracranial MRA with normal appearance of the medium and large sized vessels of the intracranial circulation. No  large vessel occlusion. No high-grade or correctable stenosis. Electronically Signed   By: Jeannine Boga M.D.   On: 12/19/2017 02:20   Dg Chest Portable 1 View  Result Date: 12/18/2017 CLINICAL DATA:  sudden onset dizziness "I feel drunk" states she was staggering And has a feeling of her tongue thickening and husband states she has some slurred speech, pt has a resting left sided facial droop. EXAM: PORTABLE CHEST - 1 VIEW COMPARISON:  09/17/2013 FINDINGS: Relatively low lung volumes with minimal linear subsegmental atelectasis in the lung bases. Heart size and mediastinal contours are within normal limits. No effusion. Visualized bones unremarkable. IMPRESSION: Low volumes.  No acute disease. Electronically Signed   By: Lucrezia Europe M.D.   On: 12/18/2017 17:10   Mr Jodene Nam Head/brain FI Cm  Result Date: 12/19/2017 CLINICAL DATA:  Initial evaluation  for acute weakness, slurred speech, facial weakness. EXAM: MRI HEAD WITHOUT CONTRAST MRA HEAD WITHOUT CONTRAST TECHNIQUE: Multiplanar, multiecho pulse sequences of the brain and surrounding structures were obtained without intravenous contrast. Angiographic images of the head were obtained using MRA technique without contrast. COMPARISON:  Prior CT from earlier the same day. FINDINGS: MRI HEAD FINDINGS Brain: Diffuse prominence of the CSF containing spaces compatible generalized age-related cerebral atrophy. Patchy and confluent T2/FLAIR hyperintensity within the periventricular and deep white matter both cerebral hemispheres consistent with chronic small vessel ischemic disease, moderate in nature. 11 mm cystic lucency within the subcortical white matter of the left parietal lobe follows CSF signal on all pulse sequences, felt to be most consistent with a dilated perivascular space. No abnormal foci of restricted diffusion to suggest acute or subacute ischemia. Gray-white matter differentiation maintained. No areas of remote cortical infarction. No foci of susceptibility artifact to suggest acute or chronic intracranial hemorrhage. No mass lesion, midline shift or mass effect. No hydrocephalus. No extra-axial fluid collection. Major dural sinuses are grossly patent. Pituitary gland suprasellar region normal. Midline structures intact and normal. Vascular: Major intracranial vascular flow voids are maintained. Skull and upper cervical spine: Craniocervical junction normal. 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 Upper cervical spine otherwise unremarkable. Bone marrow signal intensity within normal limits. No scalp soft tissue abnormality. Sinuses/Orbits: Globes and orbital soft tissues within normal limits. Patient status post lens extraction on the right. Paranasal sinuses are clear. No mastoid effusion. Inner ear structures normal. Other: None MRA HEAD FINDINGS ANTERIOR  CIRCULATION: Distal cervical segments of the internal carotid arteries are widely patent with antegrade flow. Petrous, cavernous, and supraclinoid segments patent without flow-limiting stenosis. Left A1 segment widely patent. Right A1 hypoplastic but patent as well. Anterior communicating artery normal. Anterior cerebral arteries patent to their distal aspects without flow-limiting stenosis. M1 segments patent without stenosis. Normal MCA bifurcations. No proximal M2 occlusion. Distal MCA branches well perfused and symmetric. POSTERIOR CIRCULATION: Vertebral arteries code dominant and patent to the vertebrobasilar junction without stenosis posterior inferior cerebral arteries patent bilaterally. Basilar artery patent to its distal aspect without stenosis. Superior cerebral arteries patent bilaterally. Minimal atheromatous irregularity within the PCAs bilaterally without stenosis. PCAs patent to their distal aspects No aneurysm. IMPRESSION: MRI HEAD IMPRESSION 1. No acute intracranial abnormality. 2. Age-related cerebral atrophy with mild to moderate chronic small vessel ischemic disease. MRA HEAD IMPRESSION Negative intracranial MRA with normal appearance of the medium and large sized vessels of the intracranial circulation. No large vessel occlusion. No high-grade or correctable stenosis. Electronically Signed   By: Jeannine Boga M.D.   On: 12/19/2017 02:20  Ct Head Code Stroke Wo Contrast  Result Date: 12/18/2017 CLINICAL DATA:  Code stroke. Sudden onset of dizziness. Focal neuro deficit, less than 6 hours. Slurred speech. Left-sided facial droop at baseline. EXAM: CT HEAD WITHOUT CONTRAST TECHNIQUE: Contiguous axial images were obtained from the base of the skull through the vertex without intravenous contrast. 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 Other white matter disease and atrophy is similar the  prior exam. No acute cortical infarct is present. Brainstem and cerebellum are normal. Ventricles are proportionate to the degree of atrophy. No significant extra-axial fluid collection is present. Vascular: Atherosclerotic calcifications are present within the internal carotid arteries bilaterally without a hyperdense vessel. Skull: Calvarium is intact. No focal lytic or blastic lesions are present. Sinuses/Orbits: Paranasal sinuses and mastoid air cells are clear. Globes and orbits are within normal limits. ASPECTS [facility] Stroke Program Early CT Score) - Ganglionic level infarction (caudate, lentiform nuclei, internal capsule, insula, M1-M3 cortex): 7/7 - Supraganglionic infarction (M4-M6 cortex): 3/3 Total score (0-10 with 10 being normal): 10/10 IMPRESSION: 1. No acute intracranial abnormality or significant interval change 2. Stable white matter disease. 3. 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 4. Atherosclerosis 5. ASPECTS is 10/10 Electronically Signed   By: San Morelle M.D.   On: 12/18/2017 16:09     Management plans discussed with the patient, her husband and they are in agreement.  CODE STATUS: DNR   TOTAL TIME TAKING CARE OF THIS PATIENT: 25 minutes.    Demetrios Loll M.D on 12/19/2017 at 12:26 PM  Between 7am to 6pm - Pager - [phone]  After 6pm go to [email or web address] - Proofreader  [facility]  Office  [phone]  CC: Primary care physician; [name], DO   Note: This dictation was prepared with Dragon dictation along with smaller phrase technology. Any transcriptional errors that result from this process are unintentional.

## 2017-12-19 NOTE — Evaluation (Signed)
Clinical/Bedside Swallow Evaluation Patient Details  Name: Cynthia Vaughan MRN: [id number] Date of Birth: 14-Apr-1941  Today's Date: 12/19/2017 Time: SLP Start Time (ACUTE ONLY): 0845 SLP Stop Time (ACUTE ONLY): 0925 SLP Time Calculation (min) (ACUTE ONLY): 40 min  Past Medical History:  Past Medical History:  Diagnosis Date  . Allergy   . Anxiety   . Cancer ([place])    SKIN  . CKD (chronic kidney disease) stage 2, GFR 60-89 ml/min   . Depression   . Edema    LEGS/FEET  . GERD (gastroesophageal reflux disease)   . Heart murmur   . Hyperlipidemia   . Hypertension   . Hypopotassemia   . Microalbuminuria   . Osteoarthritis   . Osteopenia   . 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 Urinary incontinence   . Vitamin D deficiency disease   . Wheezing    Past Surgical History:  Past Surgical History:  Procedure Laterality Date  . ABDOMINAL HYSTERECTOMY  2004   Total (due to bladder surgery)  . BLADDER SURGERY     x 2  . BUNIONECTOMY    . CATARACT EXTRACTION W/PHACO Right 11/23/2015   Procedure: CATARACT EXTRACTION PHACO AND INTRAOCULAR LENS PLACEMENT (IOC);  Surgeon: Estill Cotta, MD;  Location: [facility] ORS;  Service: Ophthalmology;  Laterality: Right;  [place]    1:30.9 AP%  26.9 CDE   42.29 flud casette lot # [id number]  exp05/31/2018  . COLONOSCOPY     HPI:    Per admitting history and Physical: Cynthia Vaughan  is a 77 y.o. female with a known history of anxiety, skin cancer, chronic kidney disease, depression, gastroesophageal disease, hyperlipidemia, hypertension, microalbuminuria, osteopenia, [place] spotted fever, went to her doctor's office today for routine checkup and while coming out she had episode of feeling very weakand losing balance, she also had slurred speech and facial weakness. They checked her blood pressure and sugar at doctor's office and it was normal also suggested to go to emergency room right away. In the emergency room she is checked for CT head  which was negative but because of her significant symptoms ER physician suggested to do a stroke workup. Symptomatically she is much improved now.     Assessment / Plan / Recommendation Clinical Impression  Pt presents with functional swallowing abilities at bedside with no overt s/s of aspiration. Oral mech exam revealed slight right tongue deviation but no apparent weakness or decreased sensation. Vocal quality remained clear, laryngeal elevation appeared adequate. Pt was able to masticate and propell solid bolus without difficulty. No oral residue after the swallow. Speech clear and appropriate. Pt did report that her tongue still feels funny. Pt is currently NPO, Rec and ordered regular diet. No ST needs at discharge. SLP Visit Diagnosis: Dysphagia, oral phase (R13.11)    Aspiration Risk  No limitations    Diet Recommendation Regular   Supervision: Patient able to self feed Postural Changes: Seated upright at 90 degrees    Other  Recommendations     Follow up Recommendations None      Frequency and Duration     No ST needs       Prognosis    Good    Swallow Study   General Type of Study: Bedside Swallow Evaluation Diet Prior to this Study: Regular Temperature Spikes Noted: No Respiratory Status: Room air Behavior/Cognition: Alert Oral Cavity Assessment: Within Functional Limits Oral Care Completed by SLP: No Oral Cavity - Dentition: Dentures, top;Dentures, bottom Self-Feeding Abilities: Able to  feed self Patient Positioning: Upright in bed Baseline Vocal Quality: Normal Volitional Cough: Strong Volitional Swallow: Able to elicit    Oral/Motor/Sensory Function Overall Oral Motor/Sensory Function: Within functional limits   Ice Chips Ice chips: Not tested   Thin Liquid Thin Liquid: Within functional limits    Nectar Thick Nectar Thick Liquid: Not tested   Honey Thick Honey Thick Liquid: Not tested   Puree Puree: Within functional limits Presentation: Self Fed    Solid   GO   Solid: Within functional limits Presentation: Grill 12/19/2017,9:26 AM

## 2017-12-19 NOTE — Care Management Note (Signed)
Case Management Note  Patient Details  Name: Cynthia Vaughan MRN: [id number] Date of Birth: 26-Jan-1941  Subjective/Objective:   Admitted to [facility] under observation status with the diagnosis of TIA. Lives with husband, [name] [phone]). Seen Park Liter yesterday. Prescriptions are filled at [facility] in [place]. No home Health. No skilled facility. No home oxygen. Rolling walker, standard walker, and  bedside commode in the home if needed. Takes care of all basic activities of daily living herself, drives, No falls. Good appetite. Husband will transport.                Action/Plan: No discharge needs identified at this time.   Expected Discharge Date:  12/19/17               Expected Discharge Plan:     In-House Referral:     Discharge planning Services     Post Acute Care Choice:    Choice offered to:     DME Arranged:    DME Agency:     HH Arranged:    HH Agency:     Status of Service:     If discussed at [employer], dates discussed:    Additional Comments:  Shelbie Ammons, RN MSN CCM Care Management [phone] 12/19/2017, 10:34 AM

## 2017-12-19 NOTE — Progress Notes (Signed)
Pt is being discharged home. Discharge papers given and explained to pt and spouse, both verbalized understanding.  Meds and f/u appointments reviewed. No RX at this time. ECHO done, but no results yet. Dr Bridgett Larsson is aware and OK to discharge pt.

## 2017-12-19 NOTE — Care Management Obs Status (Signed)
[place] NOTIFICATION   Patient Details  Name: Cynthia Vaughan MRN: [id number] Date of Birth: 1941/02/09   Medicare Observation Status Notification Given:  Yes    Shelbie Ammons, RN 12/19/2017, 10:27 AM

## 2017-12-19 NOTE — Evaluation (Signed)
Physical Therapy Evaluation Patient Details Name: Cynthia Vaughan MRN: [id number] DOB: Dec 05, 1940 Today's Date: 12/19/2017   History of Present Illness  77 y.o. female with a known history of anxiety, skin cancer, chronic kidney disease, depression, gastroesophageal disease, hyperlipidemia, hypertension, microalbuminuria, osteopenia, [place] spotted fever, went to her doctor's office today for routine checkup and while coming out she had episode of feeling very weakand losing balance, she also had slurred speech and facial weakness  Clinical Impression  Pt did well with PT exam and showed good safety and confidence with mobility, transfers, >100 ft of ambulation w/o AD and stair negotiation.  Pt reports that she generally feels back to her normal and is confident about being able to go home w/o further PT follow up, this PT in agreement.  No further needs, will sign off.     Follow Up Recommendations No PT follow up    Equipment Recommendations  None recommended by PT    Recommendations for Other Services       Precautions / Restrictions Precautions Precautions: Fall Restrictions Weight Bearing Restrictions: No      Mobility  Bed Mobility Overal bed mobility: Independent             General bed mobility comments: Pt able to get to sitting EOB with relative ease  Transfers Overall transfer level: Independent Equipment used: None             General transfer comment: (Pt was able to rise to standing w/o assist, no safety issues)  Ambulation/Gait Ambulation/Gait assistance: Supervision Ambulation Distance (Feet): 150 Feet Assistive device: None       General Gait Details: Pt able to walk with slow but safe and deliberate cadence.  She was able to maintain consistent cadence and generally reports being near her baseline.  Stairs Stairs: Yes Stairs assistance: Supervision Stair Management: One rail Left Number of Stairs: 4 General stair comments: Pt  able to negotiate up/down steps slowly but safely  Wheelchair Mobility    Modified Rankin (Stroke Patients Only)       Balance Overall balance assessment: Independent                                           Pertinent Vitals/Pain Pain Assessment: No/denies pain Pain Score: 0-No pain    Home Living Family/patient expects to be discharged to:: Private residence Living Arrangements: Children Available Help at Discharge: (pt is caregiver for her grown daughter) Type of Home: House Home Access: Stairs to enter Entrance Stairs-Rails: Left;Right(wide) Technical brewer of Steps: 4          Prior Function Level of Independence: Independent               Hand Dominance        Extremity/Trunk Assessment   Upper Extremity Assessment Upper Extremity Assessment: Generalized weakness;Overall [facility] for tasks assessed(equal bilaterally)    Lower Extremity Assessment Lower Extremity Assessment: Overall WFL for tasks assessed;Generalized weakness(equal bilaterally)       Communication   Communication: No difficulties  Cognition Arousal/Alertness: Awake/alert Behavior During Therapy: WFL for tasks assessed/performed Overall Cognitive Status: Within Functional Limits for tasks assessed                                        General  Comments      Exercises     Assessment/Plan    PT Assessment Patent does not need any further PT services  PT Problem List         PT Treatment Interventions      PT Goals (Current goals can be found in the Care Plan section)  Acute Rehab PT Goals Patient Stated Goal: go home PT Goal Formulation: All assessment and education complete, DC therapy    Frequency     Barriers to discharge        Co-evaluation               AM-PAC PT "6 Clicks" Daily Activity  Outcome Measure Difficulty turning over in bed (including adjusting bedclothes, sheets and blankets)?: None Difficulty  moving from lying on back to sitting on the side of the bed? : None Difficulty sitting down on and standing up from a chair with arms (e.g., wheelchair, bedside commode, etc,.)?: None Help needed moving to and from a bed to chair (including a wheelchair)?: None Help needed walking in hospital room?: None Help needed climbing 3-5 steps with a railing? : None 6 Click Score: 24    End of Session Equipment Utilized During Treatment: Gait belt Activity Tolerance: Patient tolerated treatment well Patient left: with chair alarm set;with call bell/phone within reach Nurse Communication: Mobility status PT Visit Diagnosis: Muscle weakness (generalized) (M62.81);Difficulty in walking, not elsewhere classified (R26.2)    Time: 7416-3845 PT Time Calculation (min) (ACUTE ONLY): 16 min   Charges:   PT Evaluation $PT Eval Low Complexity: 1 Low     PT G CodesKreg Shropshire, DPT 12/19/2017, 11:11 AM

## 2017-12-19 NOTE — Consult Note (Signed)
Reason for Consult: slurred speech  Referring Physician: Dr. Eleonore Chiquito   CC: slurry speech   HPI: Cynthia Vaughan is an 77 y.o. female female with a known history of anxiety, skin cancer, chronic kidney disease, depression, gastroesophageal disease, hyperlipidemia, hypertension, microalbuminuria, osteopenia, [place] spotted fever, went to her doctor's office today for routine checkup and while coming out she had episode of feeling very weakand losing balance, she also had slurred speech and facial weakness.  By the time she arrived to hospital symptoms resolved and pt is back to baseline.    Past Medical History:  Diagnosis Date  . Allergy   . Anxiety   . Cancer ([place])    SKIN  . CKD (chronic kidney disease) stage 2, GFR 60-89 ml/min   . Depression   . Edema    LEGS/FEET  . GERD (gastroesophageal reflux disease)   . Heart murmur   . Hyperlipidemia   . Hypertension   . Hypopotassemia   . Microalbuminuria   . Osteoarthritis   . Osteopenia   . [facility] spotted fever) 05/10/2016  . Urinary incontinence   . Vitamin D deficiency disease   . Wheezing     Past Surgical History:  Procedure Laterality Date  . ABDOMINAL HYSTERECTOMY  2004   Total (due to bladder surgery)  . BLADDER SURGERY     x 2  . BUNIONECTOMY    . CATARACT EXTRACTION W/PHACO Right 11/23/2015   Procedure: CATARACT EXTRACTION PHACO AND INTRAOCULAR LENS PLACEMENT (IOC);  Surgeon: Estill Cotta, MD;  Location: [facility] ORS;  Service: Ophthalmology;  Laterality: Right;  [place]    1:30.9 AP%  26.9 CDE   42.29 flud casette lot # [id number]  exp05/31/2018  . COLONOSCOPY      Family History  Problem Relation Age of Onset  . AAA (abdominal aortic aneurysm) Mother   . Arthritis Brother   . Heart disease Brother   . Stroke Daughter   . Diabetes Son   . Stroke Son   . Seizures Son   . Diabetes Brother   . Heart disease Brother        7 total heart attacks  . Hyperlipidemia Brother   . Hypertension  Brother   . Dementia Brother   . AAA (abdominal aortic aneurysm) Brother   . Stroke Son   . Heart disease Brother   . Dementia Sister     Social History:  reports that she has quit smoking. She has never used smokeless tobacco. She reports that she drinks alcohol. She reports that she does not use drugs.  Allergies  Allergen Reactions  . Codeine Itching  . Lisinopril Cough    Medications: I have reviewed the patient's current medications.  ROS: History obtained from the patient  General ROS: negative for - chills, fatigue, fever, night sweats, weight gain or weight loss Psychological ROS: negative for - behavioral disorder, hallucinations, memory difficulties, mood swings or suicidal ideation Ophthalmic ROS: negative for - blurry vision, double vision, eye pain or loss of vision ENT ROS: negative for - epistaxis, nasal discharge, oral lesions, sore throat, tinnitus or vertigo Allergy and Immunology ROS: negative for - hives or itchy/watery eyes Hematological and Lymphatic ROS: negative for - bleeding problems, bruising or swollen lymph nodes Endocrine ROS: negative for - galactorrhea, hair pattern changes, polydipsia/polyuria or temperature intolerance Respiratory ROS: negative for - cough, hemoptysis, shortness of breath or wheezing Cardiovascular ROS: negative for - chest pain, dyspnea on exertion, edema or irregular heartbeat Gastrointestinal ROS:  negative for - abdominal pain, diarrhea, hematemesis, nausea/vomiting or stool incontinence Genito-Urinary ROS: negative for - dysuria, hematuria, incontinence or urinary frequency/urgency Musculoskeletal ROS: negative for - joint swelling or muscular weakness Neurological ROS: as noted in HPI Dermatological ROS: negative for rash and skin lesion changes  Physical Examination: Blood pressure 126/81, pulse 80, temperature 97.6 F (36.4 C), temperature source Oral, resp. rate 18, height 4\' 11"  (1.499 m), weight 146 lb (66.2 kg), SpO2  93 %.  Neurological Examination   Mental Status: Alert, oriented, thought content appropriate.  Speech fluent without evidence of aphasia.  Able to follow 3 step commands without difficulty. Cranial Nerves: II: Discs flat bilaterally; Visual fields grossly normal, pupils equal, round, reactive to light and accommodation III,IV, VI: ptosis not present, extra-ocular motions intact bilaterally V,VII: smile symmetric, facial light touch sensation normal bilaterally VIII: hearing normal bilaterally IX,X: gag reflex present XI: bilateral shoulder shrug XII: midline tongue extension Motor: Right : Upper extremity   5/5    Left:     Upper extremity   5/5  Lower extremity   5/5     Lower extremity   5/5 Tone and bulk:normal tone throughout; no atrophy noted Sensory: Pinprick and light touch intact throughout, bilaterally Deep Tendon Reflexes: 1+ and symmetric throughout Plantars: Right: downgoing   Left: downgoing Cerebellar: normal finger-to-nose, normal rapid alternating movements  Gait: not tested      Laboratory Studies:   Basic Metabolic Panel: Recent Labs  Lab 12/18/17 1432 12/18/17 1559  NA 146* 139  K 3.6 3.3*  CL 106 103  CO2 24 26  GLUCOSE 87 141*  BUN 10 11  CREATININE 0.79 0.70  CALCIUM 9.2 9.2    Liver Function Tests: Recent Labs  Lab 12/18/17 1432 12/18/17 1559  AST 20 27  ALT 23 25  ALKPHOS 82 87  BILITOT 0.3 0.4  PROT 6.8 7.7  ALBUMIN 4.2 4.4   No results for input(s): LIPASE, AMYLASE in the last 168 hours. No results for input(s): AMMONIA in the last 168 hours.  CBC: Recent Labs  Lab 12/18/17 1432 12/18/17 1559  WBC 9.2 11.7*  NEUTROABS 5.8 9.0*  HGB 13.4 14.2  HCT 41.5 42.5  MCV 95 94.3  PLT 271 231    Cardiac Enzymes: Recent Labs  Lab 12/18/17 1559  TROPONINI <0.03    BNP: Invalid input(s): POCBNP  CBG: Recent Labs  Lab 12/18/17 1600  GLUCAP 124*    Microbiology: Results for orders placed or performed in visit on  09/12/17  Microscopic Examination     Status: Abnormal   Collection Time: 09/12/17  3:35 PM  Result Value Ref Range Status   WBC, UA 0-5 0 - 5 /hpf Final   RBC, UA 0-2 0 - 2 /hpf Final   Epithelial Cells (non renal) >10 (A) 0 - 10 /hpf Final   Renal Epithel, UA 0-10 (A) None seen /hpf Final   Bacteria, UA Few None seen/Few Final    Coagulation Studies: Recent Labs    12/18/17 1559  LABPROT 12.1  INR 0.90    Urinalysis: No results for input(s): COLORURINE, LABSPEC, PHURINE, GLUCOSEU, HGBUR, BILIRUBINUR, KETONESUR, PROTEINUR, UROBILINOGEN, NITRITE, LEUKOCYTESUR in the last 168 hours.  Invalid input(s): APPERANCEUR  Lipid Panel:     Component Value Date/Time   CHOL 170 12/19/2017 0352   CHOL 253 (H) 05/08/2017 1314   TRIG 60 12/19/2017 0352   HDL 60 12/19/2017 0352   HDL 60 05/08/2017 1314   CHOLHDL 2.8 12/19/2017 0352  VLDL 12 12/19/2017 0352   LDLCALC 98 12/19/2017 0352   LDLCALC 152 (H) 05/08/2017 1314    HgbA1C:  Lab Results  Component Value Date   HGBA1C 5.5 12/19/2017    Urine Drug Screen:  No results found for: LABOPIA, COCAINSCRNUR, LABBENZ, AMPHETMU, THCU, LABBARB  Alcohol Level: No results for input(s): ETH in the last 168 hours.  Other results: EKG: normal EKG, normal sinus rhythm, unchanged from previous tracings.  Imaging: Dg Chest 2 View  Result Date: 12/18/2017 CLINICAL DATA:  Weakness and stroke-like symptoms today. EXAM: CHEST - 2 VIEW COMPARISON:  12/18/2017 FINDINGS: Shallow inspiration. Heart size and pulmonary vascularity are normal for technique. Slight interstitial pattern to the lungs with some peribronchial thickening likely representing chronic bronchitis. No airspace disease or consolidation. No blunting of costophrenic angles. No pneumothorax. Mediastinal contours appear intact. Degenerative changes in the spine. IMPRESSION: Chronic bronchitic changes in the lungs. No evidence of active pulmonary disease. Electronically Signed   By:  Lucienne Capers M.D.   On: 12/18/2017 21:33   Mr Brain Wo Contrast  Result Date: 12/19/2017 CLINICAL DATA:  Initial evaluation for acute weakness, slurred speech, facial weakness. EXAM: MRI HEAD WITHOUT CONTRAST MRA HEAD WITHOUT CONTRAST TECHNIQUE: Multiplanar, multiecho pulse sequences of the brain and surrounding structures were obtained without intravenous contrast. Angiographic images of the head were obtained using MRA technique without contrast. COMPARISON:  Prior CT from earlier the same day. FINDINGS: MRI HEAD FINDINGS Brain: Diffuse prominence of the CSF containing spaces compatible generalized age-related cerebral atrophy. Patchy and confluent T2/FLAIR hyperintensity within the periventricular and deep white matter both cerebral hemispheres consistent with chronic small vessel ischemic disease, moderate in nature. 11 mm cystic lucency within the subcortical white matter of the left parietal lobe follows CSF signal on all pulse sequences, felt to be most consistent with a dilated perivascular space. No abnormal foci of restricted diffusion to suggest acute or subacute ischemia. Gray-white matter differentiation maintained. No areas of remote cortical infarction. No foci of susceptibility artifact to suggest acute or chronic intracranial hemorrhage. No mass lesion, midline shift or mass effect. No hydrocephalus. No extra-axial fluid collection. Major dural sinuses are grossly patent. Pituitary gland suprasellar region normal. Midline structures intact and normal. Vascular: Major intracranial vascular flow voids are maintained. Skull and upper cervical spine: Craniocervical junction normal. 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 Upper cervical spine otherwise unremarkable. Bone marrow signal intensity within normal limits. No scalp soft tissue abnormality. Sinuses/Orbits: Globes and orbital soft tissues within normal limits. Patient status post lens extraction on the  right. Paranasal sinuses are clear. No mastoid effusion. Inner ear structures normal. Other: None MRA HEAD FINDINGS ANTERIOR CIRCULATION: Distal cervical segments of the internal carotid arteries are widely patent with antegrade flow. Petrous, cavernous, and supraclinoid segments patent without flow-limiting stenosis. Left A1 segment widely patent. Right A1 hypoplastic but patent as well. Anterior communicating artery normal. Anterior cerebral arteries patent to their distal aspects without flow-limiting stenosis. M1 segments patent without stenosis. Normal MCA bifurcations. No proximal M2 occlusion. Distal MCA branches well perfused and symmetric. POSTERIOR CIRCULATION: Vertebral arteries code dominant and patent to the vertebrobasilar junction without stenosis posterior inferior cerebral arteries patent bilaterally. Basilar artery patent to its distal aspect without stenosis. Superior cerebral arteries patent bilaterally. Minimal atheromatous irregularity within the PCAs bilaterally without stenosis. PCAs patent to their distal aspects No aneurysm. IMPRESSION: MRI HEAD IMPRESSION 1. No acute intracranial abnormality. 2. Age-related cerebral atrophy with mild to moderate chronic small vessel  ischemic disease. MRA HEAD IMPRESSION Negative intracranial MRA with normal appearance of the medium and large sized vessels of the intracranial circulation. No large vessel occlusion. No high-grade or correctable stenosis. Electronically Signed   By: Jeannine Boga M.D.   On: 12/19/2017 02:20   Dg Chest Portable 1 View  Result Date: 12/18/2017 CLINICAL DATA:  sudden onset dizziness "I feel drunk" states she was staggering And has a feeling of her tongue thickening and husband states she has some slurred speech, pt has a resting left sided facial droop. EXAM: PORTABLE CHEST - 1 VIEW COMPARISON:  09/17/2013 FINDINGS: Relatively low lung volumes with minimal linear subsegmental atelectasis in the lung bases. Heart size  and mediastinal contours are within normal limits. No effusion. Visualized bones unremarkable. IMPRESSION: Low volumes.  No acute disease. Electronically Signed   By: Lucrezia Europe M.D.   On: 12/18/2017 17:10   Mr Jodene Nam Head/brain VH Cm  Result Date: 12/19/2017 CLINICAL DATA:  Initial evaluation for acute weakness, slurred speech, facial weakness. EXAM: MRI HEAD WITHOUT CONTRAST MRA HEAD WITHOUT CONTRAST TECHNIQUE: Multiplanar, multiecho pulse sequences of the brain and surrounding structures were obtained without intravenous contrast. Angiographic images of the head were obtained using MRA technique without contrast. COMPARISON:  Prior CT from earlier the same day. FINDINGS: MRI HEAD FINDINGS Brain: Diffuse prominence of the CSF containing spaces compatible generalized age-related cerebral atrophy. Patchy and confluent T2/FLAIR hyperintensity within the periventricular and deep white matter both cerebral hemispheres consistent with chronic small vessel ischemic disease, moderate in nature. 11 mm cystic lucency within the subcortical white matter of the left parietal lobe follows CSF signal on all pulse sequences, felt to be most consistent with a dilated perivascular space. No abnormal foci of restricted diffusion to suggest acute or subacute ischemia. Gray-white matter differentiation maintained. No areas of remote cortical infarction. No foci of susceptibility artifact to suggest acute or chronic intracranial hemorrhage. No mass lesion, midline shift or mass effect. No hydrocephalus. No extra-axial fluid collection. Major dural sinuses are grossly patent. Pituitary gland suprasellar region normal. Midline structures intact and normal. Vascular: Major intracranial vascular flow voids are maintained. Skull and upper cervical spine: Craniocervical junction normal. 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 Upper cervical spine otherwise unremarkable. Bone marrow signal intensity  within normal limits. No scalp soft tissue abnormality. Sinuses/Orbits: Globes and orbital soft tissues within normal limits. Patient status post lens extraction on the right. Paranasal sinuses are clear. No mastoid effusion. Inner ear structures normal. Other: None MRA HEAD FINDINGS ANTERIOR CIRCULATION: Distal cervical segments of the internal carotid arteries are widely patent with antegrade flow. Petrous, cavernous, and supraclinoid segments patent without flow-limiting stenosis. Left A1 segment widely patent. Right A1 hypoplastic but patent as well. Anterior communicating artery normal. Anterior cerebral arteries patent to their distal aspects without flow-limiting stenosis. M1 segments patent without stenosis. Normal MCA bifurcations. No proximal M2 occlusion. Distal MCA branches well perfused and symmetric. POSTERIOR CIRCULATION: Vertebral arteries code dominant and patent to the vertebrobasilar junction without stenosis posterior inferior cerebral arteries patent bilaterally. Basilar artery patent to its distal aspect without stenosis. Superior cerebral arteries patent bilaterally. Minimal atheromatous irregularity within the PCAs bilaterally without stenosis. PCAs patent to their distal aspects No aneurysm. IMPRESSION: MRI HEAD IMPRESSION 1. No acute intracranial abnormality. 2. Age-related cerebral atrophy with mild to moderate chronic small vessel ischemic disease. MRA HEAD IMPRESSION Negative intracranial MRA with normal appearance of the medium and large sized vessels of the intracranial circulation. No large  vessel occlusion. No high-grade or correctable stenosis. Electronically Signed   By: Jeannine Boga M.D.   On: 12/19/2017 02:20   Ct Head Code Stroke Wo Contrast  Result Date: 12/18/2017 CLINICAL DATA:  Code stroke. Sudden onset of dizziness. Focal neuro deficit, less than 6 hours. Slurred speech. Left-sided facial droop at baseline. EXAM: CT HEAD WITHOUT CONTRAST TECHNIQUE: Contiguous  axial images were obtained from the base of the skull through the vertex without intravenous contrast. 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 Other white matter disease and atrophy is similar the prior exam. No acute cortical infarct is present. Brainstem and cerebellum are normal. Ventricles are proportionate to the degree of atrophy. No significant extra-axial fluid collection is present. Vascular: Atherosclerotic calcifications are present within the internal carotid arteries bilaterally without a hyperdense vessel. Skull: Calvarium is intact. No focal lytic or blastic lesions are present. Sinuses/Orbits: Paranasal sinuses and mastoid air cells are clear. Globes and orbits are within normal limits. ASPECTS [facility] Stroke Program Early CT Score) - Ganglionic level infarction (caudate, lentiform nuclei, internal capsule, insula, M1-M3 cortex): 7/7 - Supraganglionic infarction (M4-M6 cortex): 3/3 Total score (0-10 with 10 being normal): 10/10 IMPRESSION: 1. No acute intracranial abnormality or significant interval change 2. Stable white matter disease. 3. 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 4. Atherosclerosis 5. ASPECTS is 10/10 Electronically Signed   By: San Morelle M.D.   On: 12/18/2017 16:09     Assessment/Plan:  77 y.o. female female with a known history of anxiety, skin cancer, chronic kidney disease, depression, gastroesophageal disease, hyperlipidemia, hypertension, microalbuminuria, osteopenia, [place] spotted fever, went to her doctor's office today for routine checkup and while coming out she had episode of feeling very weakand losing balance, she also had slurred speech and facial weakness.  By the time she arrived to hospital symptoms resolved and pt is back to baseline.   -Currently back to baseline  - Likely TIA - MRI brain no acute abnormality -  awaiting echo - was not taking ASA at home - ASA and stating - likely d/c today Robie Oats  12/19/2017, 10:32 AM

## 2017-12-19 NOTE — Progress Notes (Signed)
*  PRELIMINARY RESULTS* Echocardiogram 2D Echocardiogram has been performed.  Cynthia Vaughan Cynthia Vaughan Cynthia Vaughan Cynthia Vaughan 12/19/2017, 1:55 PM

## 2017-12-20 ENCOUNTER — Telehealth: Payer: Self-pay

## 2017-12-20 DIAGNOSIS — M48061 Spinal stenosis, lumbar region without neurogenic claudication: Secondary | ICD-10-CM | POA: Diagnosis not present

## 2017-12-20 DIAGNOSIS — R262 Difficulty in walking, not elsewhere classified: Secondary | ICD-10-CM | POA: Diagnosis not present

## 2017-12-20 NOTE — Telephone Encounter (Signed)
Transition Care Management Follow-up Telephone Call   Date discharged? 12/19/2017   How have you been since you were released from the hospital? "okay"   Do you understand why you were in the hospital? yes   Do you understand the discharge instructions? yes   Where were you discharged to? Home    Items Reviewed:  Medications reviewed: yes  Allergies reviewed: yes  Dietary changes reviewed: yes  Referrals reviewed: yes   Functional Questionnaire:   Activities of Daily Living (ADLs):   She states they are independent in the following: ambulation, bathing and hygiene, feeding, continence, grooming, toileting and dressing States they require assistance with the following: none   Any transportation issues/concerns?: no   Any patient concerns? no   Confirmed importance and date/time of follow-up visits scheduled yes  Provider Appointment booked with Dr.Johnson on 12/22/2017 at 2pm.  Confirmed with patient if condition begins to worsen call PCP or go to the ER.  Patient was given the office number and encouraged to call back with question or concerns.  : yes

## 2017-12-21 ENCOUNTER — Other Ambulatory Visit: Payer: Self-pay | Admitting: Family Medicine

## 2017-12-22 ENCOUNTER — Ambulatory Visit (INDEPENDENT_AMBULATORY_CARE_PROVIDER_SITE_OTHER): Payer: Medicare Other | Admitting: Family Medicine

## 2017-12-22 ENCOUNTER — Encounter: Payer: Self-pay | Admitting: Family Medicine

## 2017-12-22 VITALS — BP 132/84 | HR 83 | Temp 97.7°F | Wt 141.4 lb

## 2017-12-22 DIAGNOSIS — G3184 Mild cognitive impairment, so stated: Secondary | ICD-10-CM | POA: Diagnosis not present

## 2017-12-22 DIAGNOSIS — I129 Hypertensive chronic kidney disease with stage 1 through stage 4 chronic kidney disease, or unspecified chronic kidney disease: Secondary | ICD-10-CM | POA: Diagnosis not present

## 2017-12-22 DIAGNOSIS — G459 Transient cerebral ischemic attack, unspecified: Secondary | ICD-10-CM

## 2017-12-22 DIAGNOSIS — F419 Anxiety disorder, unspecified: Secondary | ICD-10-CM | POA: Diagnosis not present

## 2017-12-22 DIAGNOSIS — R410 Disorientation, unspecified: Secondary | ICD-10-CM

## 2017-12-22 NOTE — Progress Notes (Signed)
BP 132/84 (BP Location: Left Arm, Patient Position: Sitting, Cuff Size: Normal)   Pulse 83   Temp 97.7 F (36.5 C)   Wt 141 lb 7 oz (64.2 kg)   LMP  (LMP Unknown)   SpO2 96%   BMI 28.57 kg/m    Subjective:    Patient ID: Cynthia Vaughan, female    DOB: [birth year], 77 y.o.   MRN: [id number]  HPI: Cynthia Vaughan is a 77 y.o. female  Chief Complaint  Patient presents with  . Hospitalization Follow-up   HOSPITAL FOLLOW UP Time since discharge: 3 days Hospital/facility: [facility] Diagnosis: TIA Procedures/tests: MRI and MRA of brain, Echo and carotid doppler Consultants: Neurology New medications: None Discharge instructions:  Follow up here Status: better  Now off the gabapentin, off the buspar, switch zoloft to cymbalta to help with the pain, taking the tramadol. Feeling better. No funny spells. Thinks that the buspar was giving her a lot of her side effects. She has home health coming in to try to help with her balance- but has been running around helping her daughter, so hasn't been able to see them yet. Otherwise feeling well.   Transition of Care Hospital Follow up.   Hospital/Facility: [facility] D/C Physician: Demetrios Loll, MD D/C Date: 12/19/17  Records Requested: 12/19/17  Records Received: 12/19/17 Records Reviewed: 12/19/17  Diagnoses on Discharge: TIA  Date of interactive Contact within 48 hours of discharge: 12/20/17 Contact was through: phone  Date of 7 day or 14 day face-to-face visit: 12/22/17,   within 7 days  Outpatient Encounter Medications as of 12/22/2017  Medication Sig  . albuterol (PROAIR HFA) 108 (90 Base) MCG/ACT inhaler INHALE 2 PUFFS INTO THE LUNGS EVERY 6 HOURS AS NEEDED FOR WHEEZING OR SHORTNESS OF BREATH  . amLODipine (NORVASC) 10 MG tablet Take 1 tablet (10 mg total) by mouth daily.  . ARIPiprazole (ABILIFY) 5 MG tablet Take 1 tablet (5 mg total) by mouth daily.  [name] aspirin EC 81 MG tablet Take 81 mg by mouth daily.  [name] atorvastatin (LIPITOR) 40 MG tablet  Take 1 tablet (40 mg total) by mouth at bedtime.  . Cholecalciferol (VITAMIN D-3) 1000 units CAPS Take 1 capsule (1,000 Units total) by mouth daily.  . DULoxetine (CYMBALTA) 60 MG capsule Take 1 capsule (60 mg total) by mouth daily. (Patient not taking: Reported on 12/18/2017)  . fluticasone (FLONASE) 50 MCG/ACT nasal spray Place 2 sprays into both nostrils daily.  [name] loratadine (CLARITIN) 10 MG tablet Take 1 tablet (10 mg total) by mouth daily.  [name] losartan (COZAAR) 50 MG tablet Take 1 tablet (50 mg total) by mouth daily.  [name] omeprazole (PRILOSEC) 20 MG capsule Take 1 capsule (20 mg total) by mouth daily.  . ranitidine (ZANTAC) 150 MG tablet TAKE 2 TABLETS BY MOUTH EVERY EVENING  . traMADol (ULTRAM) 50 MG tablet Take 1 tablet (50 mg total) by mouth every 8 (eight) hours as needed.  . Vitamin D, Ergocalciferol, (DRISDOL) 50000 units CAPS capsule Take 1 capsule (50,000 Units total) by mouth every 7 (seven) days.  . [DISCONTINUED] busPIRone (BUSPAR) 15 MG tablet Take 1 tablet (15 mg total) by mouth 3 (three) times daily.  . [DISCONTINUED] ranitidine (ZANTAC) 150 MG tablet Take 2 tablets (300 mg total) by mouth every evening.   No facility-administered encounter medications on file as of 12/22/2017.    Medication Changes: Naproxen stopped, the rest stayed the same  Diagnostic Tests Reviewed:  Dg Chest 2 View  Result Date: 12/18/2017  CLINICAL DATA:  Weakness and stroke-like symptoms today. EXAM: CHEST - 2 VIEW COMPARISON:  12/18/2017 FINDINGS: Shallow inspiration. Heart size and pulmonary vascularity are normal for technique. Slight interstitial pattern to the lungs with some peribronchial thickening likely representing chronic bronchitis. No airspace disease or consolidation. No blunting of costophrenic angles. No pneumothorax. Mediastinal contours appear intact. Degenerative changes in the spine. IMPRESSION: Chronic bronchitic changes in the lungs. No evidence of active pulmonary disease. Electronically  Signed   By: Lucienne Capers M.D.   On: 12/18/2017 21:33   Mr Brain Wo Contrast  Result Date: 12/19/2017 CLINICAL DATA:  Initial evaluation for acute weakness, slurred speech, facial weakness. EXAM: MRI HEAD WITHOUT CONTRAST MRA HEAD WITHOUT CONTRAST TECHNIQUE: Multiplanar, multiecho pulse sequences of the brain and surrounding structures were obtained without intravenous contrast. Angiographic images of the head were obtained using MRA technique without contrast. COMPARISON:  Prior CT from earlier the same day. FINDINGS: MRI HEAD FINDINGS Brain: Diffuse prominence of the CSF containing spaces compatible generalized age-related cerebral atrophy. Patchy and confluent T2/FLAIR hyperintensity within the periventricular and deep white matter both cerebral hemispheres consistent with chronic small vessel ischemic disease, moderate in nature. 11 mm cystic lucency within the subcortical white matter of the left parietal lobe follows CSF signal on all pulse sequences, felt to be most consistent with a dilated perivascular space. No abnormal foci of restricted diffusion to suggest acute or subacute ischemia. Gray-white matter differentiation maintained. No areas of remote cortical infarction. No foci of susceptibility artifact to suggest acute or chronic intracranial hemorrhage. No mass lesion, midline shift or mass effect. No hydrocephalus. No extra-axial fluid collection. Major dural sinuses are grossly patent. Pituitary gland suprasellar region normal. Midline structures intact and normal. Vascular: Major intracranial vascular flow voids are maintained. Skull and upper cervical spine: Craniocervical junction normal. 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 Upper cervical spine otherwise unremarkable. Bone marrow signal intensity within normal limits. No scalp soft tissue abnormality. Sinuses/Orbits: Globes and orbital soft tissues within normal limits. Patient status post lens  extraction on the right. Paranasal sinuses are clear. No mastoid effusion. Inner ear structures normal. Other: None MRA HEAD FINDINGS ANTERIOR CIRCULATION: Distal cervical segments of the internal carotid arteries are widely patent with antegrade flow. Petrous, cavernous, and supraclinoid segments patent without flow-limiting stenosis. Left A1 segment widely patent. Right A1 hypoplastic but patent as well. Anterior communicating artery normal. Anterior cerebral arteries patent to their distal aspects without flow-limiting stenosis. M1 segments patent without stenosis. Normal MCA bifurcations. No proximal M2 occlusion. Distal MCA branches well perfused and symmetric. POSTERIOR CIRCULATION: Vertebral arteries code dominant and patent to the vertebrobasilar junction without stenosis posterior inferior cerebral arteries patent bilaterally. Basilar artery patent to its distal aspect without stenosis. Superior cerebral arteries patent bilaterally. Minimal atheromatous irregularity within the PCAs bilaterally without stenosis. PCAs patent to their distal aspects No aneurysm. IMPRESSION: MRI HEAD IMPRESSION 1. No acute intracranial abnormality. 2. Age-related cerebral atrophy with mild to moderate chronic small vessel ischemic disease. MRA HEAD IMPRESSION Negative intracranial MRA with normal appearance of the medium and large sized vessels of the intracranial circulation. No large vessel occlusion. No high-grade or correctable stenosis. Electronically Signed   By: Jeannine Boga M.D.   On: 12/19/2017 02:20   Dg Chest Portable 1 View  Result Date: 12/18/2017 CLINICAL DATA:  sudden onset dizziness "I feel drunk" states she was staggering And has a feeling of her tongue thickening and husband states she has some slurred speech, pt  has a resting left sided facial droop. EXAM: PORTABLE CHEST - 1 VIEW COMPARISON:  09/17/2013 FINDINGS: Relatively low lung volumes with minimal linear subsegmental atelectasis in the  lung bases. Heart size and mediastinal contours are within normal limits. No effusion. Visualized bones unremarkable. IMPRESSION: Low volumes.  No acute disease. Electronically Signed   By: Lucrezia Europe M.D.   On: 12/18/2017 17:10   Mr Jodene Nam Head/brain ZO Cm  Result Date: 12/19/2017 CLINICAL DATA:  Initial evaluation for acute weakness, slurred speech, facial weakness. EXAM: MRI HEAD WITHOUT CONTRAST MRA HEAD WITHOUT CONTRAST TECHNIQUE: Multiplanar, multiecho pulse sequences of the brain and surrounding structures were obtained without intravenous contrast. Angiographic images of the head were obtained using MRA technique without contrast. COMPARISON:  Prior CT from earlier the same day. FINDINGS: MRI HEAD FINDINGS Brain: Diffuse prominence of the CSF containing spaces compatible generalized age-related cerebral atrophy. Patchy and confluent T2/FLAIR hyperintensity within the periventricular and deep white matter both cerebral hemispheres consistent with chronic small vessel ischemic disease, moderate in nature. 11 mm cystic lucency within the subcortical white matter of the left parietal lobe follows CSF signal on all pulse sequences, felt to be most consistent with a dilated perivascular space. No abnormal foci of restricted diffusion to suggest acute or subacute ischemia. Gray-white matter differentiation maintained. No areas of remote cortical infarction. No foci of susceptibility artifact to suggest acute or chronic intracranial hemorrhage. No mass lesion, midline shift or mass effect. No hydrocephalus. No extra-axial fluid collection. Major dural sinuses are grossly patent. Pituitary gland suprasellar region normal. Midline structures intact and normal. Vascular: Major intracranial vascular flow voids are maintained. Skull and upper cervical spine: Craniocervical junction normal. 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 Upper cervical spine otherwise unremarkable. Bone  marrow signal intensity within normal limits. No scalp soft tissue abnormality. Sinuses/Orbits: Globes and orbital soft tissues within normal limits. Patient status post lens extraction on the right. Paranasal sinuses are clear. No mastoid effusion. Inner ear structures normal. Other: None MRA HEAD FINDINGS ANTERIOR CIRCULATION: Distal cervical segments of the internal carotid arteries are widely patent with antegrade flow. Petrous, cavernous, and supraclinoid segments patent without flow-limiting stenosis. Left A1 segment widely patent. Right A1 hypoplastic but patent as well. Anterior communicating artery normal. Anterior cerebral arteries patent to their distal aspects without flow-limiting stenosis. M1 segments patent without stenosis. Normal MCA bifurcations. No proximal M2 occlusion. Distal MCA branches well perfused and symmetric. POSTERIOR CIRCULATION: Vertebral arteries code dominant and patent to the vertebrobasilar junction without stenosis posterior inferior cerebral arteries patent bilaterally. Basilar artery patent to its distal aspect without stenosis. Superior cerebral arteries patent bilaterally. Minimal atheromatous irregularity within the PCAs bilaterally without stenosis. PCAs patent to their distal aspects No aneurysm. IMPRESSION: MRI HEAD IMPRESSION 1. No acute intracranial abnormality. 2. Age-related cerebral atrophy with mild to moderate chronic small vessel ischemic disease. MRA HEAD IMPRESSION Negative intracranial MRA with normal appearance of the medium and large sized vessels of the intracranial circulation. No large vessel occlusion. No high-grade or correctable stenosis. Electronically Signed   By: Jeannine Boga M.D.   On: 12/19/2017 02:20   Ct Head Code Stroke Wo Contrast  Result Date: 12/18/2017 CLINICAL DATA:  Code stroke. Sudden onset of dizziness. Focal neuro deficit, less than 6 hours. Slurred speech. Left-sided facial droop at baseline. EXAM: CT HEAD WITHOUT  CONTRAST TECHNIQUE: Contiguous axial images were obtained from the base of the skull through the vertex without intravenous contrast. 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 Other white matter disease and atrophy is similar the prior exam. No acute cortical infarct is present. Brainstem and cerebellum are normal. Ventricles are proportionate to the degree of atrophy. No significant extra-axial fluid collection is present. Vascular: Atherosclerotic calcifications are present within the internal carotid arteries bilaterally without a hyperdense vessel. Skull: Calvarium is intact. No focal lytic or blastic lesions are present. Sinuses/Orbits: Paranasal sinuses and mastoid air cells are clear. Globes and orbits are within normal limits. ASPECTS [facility] Stroke Program Early CT Score) - Ganglionic level infarction (caudate, lentiform nuclei, internal capsule, insula, M1-M3 cortex): 7/7 - Supraganglionic infarction (M4-M6 cortex): 3/3 Total score (0-10 with 10 being normal): 10/10 IMPRESSION: 1. No acute intracranial abnormality or significant interval change 2. Stable white matter disease. 3. 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 4. Atherosclerosis 5. ASPECTS is 10/10 Electronically Signed   By: San Morelle M.D.   On: 12/18/2017 16:09   Consults: Neurology, Leotis Pain, MD  Discharge Instructions: Follow up with [place]. Home. No services  Disease/illness Education: TIA discussed- Information and warning signs discussed  Home Health/Community Services Discussions/Referrals: PT and home health on board from prior to hospitalization  Establishment or re-establishment of referral orders for community resources: N/A  Discussion with other health care providers: N/A  Assessment and Support of treatment regimen adherence: Has pill pack. Taking medicine as  prescribed.  Appointments Coordinated with: Mechele Claude  Education for self-management, independent living, and ADLs: Home health in. Pill pack done.    Relevant past medical, surgical, family and social history reviewed and updated as indicated. Interim medical history since our last visit reviewed. Allergies and medications reviewed and updated.  Review of Systems  Constitutional: Negative.   Respiratory: Negative.   Cardiovascular: Negative.   Gastrointestinal: Negative.   Musculoskeletal: Negative.   Neurological: Negative.   Hematological: Negative.   Psychiatric/Behavioral: Negative.     Per HPI unless specifically indicated above     Objective:    BP 132/84 (BP Location: Left Arm, Patient Position: Sitting, Cuff Size: Normal)   Pulse 83   Temp 97.7 F (36.5 C)   Wt 141 lb 7 oz (64.2 kg)   LMP  (LMP Unknown)   SpO2 96%   BMI 28.57 kg/m   Wt Readings from Last 3 Encounters:  12/22/17 141 lb 7 oz (64.2 kg)  12/18/17 146 lb (66.2 kg)  12/18/17 140 lb 7 oz (63.7 kg)    Physical Exam  Constitutional: She is oriented to person, place, and time. She appears well-developed and well-nourished. No distress.  HENT:  Head: Normocephalic and atraumatic.  Right Ear: Hearing normal.  Left Ear: Hearing normal.  Nose: Nose normal.  Eyes: Conjunctivae and lids are normal. Right eye exhibits no discharge. Left eye exhibits no discharge. No scleral icterus.  Cardiovascular: Normal rate, regular rhythm, normal heart sounds and intact distal pulses. Exam reveals no gallop and no friction rub.  No murmur heard. Pulmonary/Chest: Effort normal and breath sounds normal. No stridor. No respiratory distress. She has no wheezes. She has no rales. She exhibits no tenderness.  Musculoskeletal: Normal range of motion.  Neurological: She is alert and oriented to person, place, and time. She displays normal reflexes. No cranial nerve deficit or sensory deficit. She exhibits normal muscle tone.  Coordination normal.  Skin: Skin is warm, dry and intact. Capillary refill takes less than 2 seconds. No rash noted. She is  not diaphoretic. No erythema. No pallor.  Psychiatric: She has a normal mood and affect. Her speech is normal and behavior is normal. Judgment and thought content normal. Cognition and memory are normal.  Nursing note and vitals reviewed.   Results for orders placed or performed during the hospital encounter of 12/18/17  Protime-INR  Result Value Ref Range   Prothrombin Time 12.1 11.4 - 15.2 seconds   INR 0.90   APTT  Result Value Ref Range   aPTT 24 24 - 36 seconds  CBC  Result Value Ref Range   WBC 11.7 (H) 3.6 - 11.0 K/uL   RBC 4.51 3.80 - 5.20 MIL/uL   Hemoglobin 14.2 12.0 - 16.0 g/dL   HCT 42.5 35.0 - 47.0 %   MCV 94.3 80.0 - 100.0 fL   MCH 31.4 26.0 - 34.0 pg   MCHC 33.3 32.0 - 36.0 g/dL   RDW 12.9 11.5 - 14.5 %   Platelets 231 150 - 440 K/uL  Differential  Result Value Ref Range   Neutrophils Relative % 76 %   Neutro Abs 9.0 (H) 1.4 - 6.5 K/uL   Lymphocytes Relative 14 %   Lymphs Abs 1.6 1.0 - 3.6 K/uL   Monocytes Relative 7 %   Monocytes Absolute 0.9 0.2 - 0.9 K/uL   Eosinophils Relative 2 %   Eosinophils Absolute 0.2 0 - 0.7 K/uL   Basophils Relative 1 %   Basophils Absolute 0.1 0 - 0.1 K/uL  Comprehensive metabolic panel  Result Value Ref Range   Sodium 139 135 - 145 mmol/L   Potassium 3.3 (L) 3.5 - 5.1 mmol/L   Chloride 103 101 - 111 mmol/L   CO2 26 22 - 32 mmol/L   Glucose, Bld 141 (H) 65 - 99 mg/dL   BUN 11 6 - 20 mg/dL   Creatinine, Ser 0.70 0.44 - 1.00 mg/dL   Calcium 9.2 8.9 - 10.3 mg/dL   Total Protein 7.7 6.5 - 8.1 g/dL   Albumin 4.4 3.5 - 5.0 g/dL   AST 27 15 - 41 U/L   ALT 25 14 - 54 U/L   Alkaline Phosphatase 87 38 - 126 U/L   Total Bilirubin 0.4 0.3 - 1.2 mg/dL   GFR calc non Af Amer >60 >60 mL/min   GFR calc Af Amer >60 >60 mL/min   Anion gap 10 5 - 15  Troponin I  Result Value Ref Range   Troponin I <0.03 <0.03  ng/mL  Glucose, capillary  Result Value Ref Range   Glucose-Capillary 124 (H) 65 - 99 mg/dL  Hemoglobin A1c  Result Value Ref Range   Hgb A1c MFr Bld 5.5 4.8 - 5.6 %   Mean Plasma Glucose 111.15 mg/dL  Lipid panel  Result Value Ref Range   Cholesterol 170 0 - 200 mg/dL   Triglycerides 60 <150 mg/dL   HDL 60 >40 mg/dL   Total CHOL/HDL Ratio 2.8 RATIO   VLDL 12 0 - 40 mg/dL   LDL Cholesterol 98 0 - 99 mg/dL  ECHOCARDIOGRAM COMPLETE  Result Value Ref Range   Weight 2,336 oz   Height 59 in   BP 142/85 mmHg      Assessment & Plan:   Problem List Items Addressed This Visit      Cardiovascular and Mediastinum   TIA (transient ischemic attack) - Primary    Warning signs and when to call rescue discussed with patient. Will keep BP and cholesterol under good control. Continue current regimen. Continue to  monitor. Call with any concerns. Referral to neurology made today. Call with any concerns.       Relevant Orders   Ambulatory referral to Neurology     Genitourinary   Benign hypertensive renal disease    Under good control today. Continue current regimen. Continue to monitor. Call with any concerns.         Other   Anxiety    Thinks the buspar was making her loopy. Will keep her off the buspar and the gabapentin. Has been changed from zoloft to cymbalta. Recheck 1 month to see how she's doing. Call with any concerns.        Other Visit Diagnoses    Impaired orientation       Will stop her buspar. Continue off gabapentin. Doing much better today. Will get her into neurology for further evaluation. Referral made today.   Relevant Orders   Ambulatory referral to Neurology       Follow up plan: Return 2-3 weeks, for follow up.

## 2017-12-26 ENCOUNTER — Encounter: Payer: Self-pay | Admitting: Family Medicine

## 2017-12-26 DIAGNOSIS — M48061 Spinal stenosis, lumbar region without neurogenic claudication: Secondary | ICD-10-CM | POA: Diagnosis not present

## 2017-12-26 DIAGNOSIS — R262 Difficulty in walking, not elsewhere classified: Secondary | ICD-10-CM | POA: Diagnosis not present

## 2017-12-26 NOTE — Assessment & Plan Note (Signed)
Thinks the buspar was making her loopy. Will keep her off the buspar and the gabapentin. Has been changed from zoloft to cymbalta. Recheck 1 month to see how she's doing. Call with any concerns.

## 2017-12-26 NOTE — Assessment & Plan Note (Signed)
Under good control today. Continue current regimen. Continue to monitor. Call with any concerns.  

## 2017-12-26 NOTE — Assessment & Plan Note (Signed)
Warning signs and when to call rescue discussed with patient. Will keep BP and cholesterol under good control. Continue current regimen. Continue to monitor. Call with any concerns. Referral to neurology made today. Call with any concerns.

## 2017-12-28 ENCOUNTER — Telehealth: Payer: Self-pay | Admitting: Family Medicine

## 2017-12-28 DIAGNOSIS — M48061 Spinal stenosis, lumbar region without neurogenic claudication: Secondary | ICD-10-CM | POA: Diagnosis not present

## 2017-12-28 DIAGNOSIS — R262 Difficulty in walking, not elsewhere classified: Secondary | ICD-10-CM | POA: Diagnosis not present

## 2017-12-28 NOTE — Telephone Encounter (Signed)
Noted. Thanks.

## 2017-12-28 NOTE — Telephone Encounter (Signed)
Copied from [place] [id number]. Topic: Inquiry >> Dec 28, 2017 12:53 PM Pricilla Handler wrote: Reason for CRM: Benjamine Mola with [facility] [phone]) visited the patient today for PT Evaluation. Benjamine Mola states that the patient does not want PT at this time. The patient stated that PT only helps her a little bit. Benjamine Mola also stated that the patient is considering going to [facility] for an injection. If you have any questions or concerns, please call Benjamine Mola at [phone].     Thank You!!!  >> Dec 28, 2017  3:25 PM Georgina Peer, CMA wrote: Routing to provider, Juluis Rainier.

## 2018-01-02 ENCOUNTER — Inpatient Hospital Stay: Payer: Medicare Other | Admitting: Family Medicine

## 2018-01-15 ENCOUNTER — Emergency Department: Payer: Medicare Other

## 2018-01-15 ENCOUNTER — Encounter: Payer: Self-pay | Admitting: Emergency Medicine

## 2018-01-15 ENCOUNTER — Emergency Department
Admission: EM | Admit: 2018-01-15 | Discharge: 2018-01-15 | Disposition: A | Discharge status: Home / Self Care | Payer: Medicare Other | Attending: Emergency Medicine | Admitting: Emergency Medicine

## 2018-01-15 ENCOUNTER — Other Ambulatory Visit: Payer: Self-pay

## 2018-01-15 DIAGNOSIS — I129 Hypertensive chronic kidney disease with stage 1 through stage 4 chronic kidney disease, or unspecified chronic kidney disease: Secondary | ICD-10-CM | POA: Insufficient documentation

## 2018-01-15 DIAGNOSIS — Z85828 Personal history of other malignant neoplasm of skin: Secondary | ICD-10-CM | POA: Insufficient documentation

## 2018-01-15 DIAGNOSIS — R197 Diarrhea, unspecified: Secondary | ICD-10-CM | POA: Diagnosis not present

## 2018-01-15 DIAGNOSIS — Z87891 Personal history of nicotine dependence: Secondary | ICD-10-CM | POA: Insufficient documentation

## 2018-01-15 DIAGNOSIS — Z8673 Personal history of transient ischemic attack (TIA), and cerebral infarction without residual deficits: Secondary | ICD-10-CM | POA: Insufficient documentation

## 2018-01-15 DIAGNOSIS — Z7982 Long term (current) use of aspirin: Secondary | ICD-10-CM | POA: Diagnosis not present

## 2018-01-15 DIAGNOSIS — K5900 Constipation, unspecified: Secondary | ICD-10-CM | POA: Diagnosis not present

## 2018-01-15 DIAGNOSIS — N182 Chronic kidney disease, stage 2 (mild): Secondary | ICD-10-CM | POA: Insufficient documentation

## 2018-01-15 DIAGNOSIS — Z79899 Other long term (current) drug therapy: Secondary | ICD-10-CM | POA: Insufficient documentation

## 2018-01-15 MED ORDER — MAGNESIUM CITRATE PO SOLN: 1.0000 | Freq: Once | ORAL | 1 refills | Status: AC

## 2018-01-15 MED ORDER — FLEET ENEMA 7-19 GM/118ML RE ENEM: 1.0000 | ENEMA | Freq: Every day | RECTAL | 0 refills | Status: DC | PRN

## 2018-01-15 MED ORDER — NAPROXEN 500 MG PO TABS
500.0000 mg | ORAL_TABLET | Freq: Once | ORAL | Status: AC
  Administered 2018-01-15: 500 mg via ORAL
  Filled 2018-01-15: qty 1

## 2018-01-15 NOTE — Discharge Instructions (Addendum)
Take the magnesium citrate and/or the enema as needed for constipation.  Follow-up with your primary care doctor.  Return to the ER for new, worsening, or persistent severe constipation, abdominal pain, vomiting, or any other new or worsening symptoms that concern you.

## 2018-01-15 NOTE — ED Triage Notes (Signed)
Pt here for constipation.  No bowel movement X about 1 week. Has tried OTC medications without relief. Some pain from being unable to use bathroom.

## 2018-01-15 NOTE — ED Provider Notes (Signed)
[facility] Emergency Department Provider Note ____________________________________________   First MD Initiated Contact with Patient 01/15/18 [phone]     (approximate)  I have reviewed the triage vital signs and the nursing notes.   HISTORY  Chief Complaint Constipation    HPI Cynthia Vaughan is a 77 y.o. female with PMH as noted below who presents with constipation over the last few weeks, with her last bowel movement being approximately 8 days ago.  Patient reports associated intermittent crampy abdominal discomfort, and some nausea but no vomiting.  She denies abdominal distention.  She denies any specific rectal pain or bleeding.  She denies being on any new medications, and states that she takes tramadol and Aleve for pain but she has been on these for a long time.  Past Medical History:  Diagnosis Date  . Allergy   . Anxiety   . Cancer ([place])    SKIN  . CKD (chronic kidney disease) stage 2, GFR 60-89 ml/min   . Depression   . Edema    LEGS/FEET  . GERD (gastroesophageal reflux disease)   . Heart murmur   . Hyperlipidemia   . Hypertension   . Hypopotassemia   . Microalbuminuria   . Osteoarthritis   . Osteopenia   . [facility] spotted fever) 05/10/2016  . Urinary incontinence   . Vitamin D deficiency disease   . Wheezing     Patient Active Problem List   Diagnosis Date Noted  . TIA (transient ischemic attack) 12/18/2017  . Degenerative joint disease (DJD) of lumbar spine 10/27/2017  . Spinal stenosis of lumbar region 10/27/2017  . Rotator cuff impingement syndrome of right shoulder 12/08/2015  . Wrist pain, left 07/07/2015  . NSAID long-term use 07/07/2015  . Anxiety   . Depression   . Hyperlipidemia   . Benign hypertensive renal disease   . Osteoarthritis   . Urinary incontinence   . Osteopenia   . Vitamin D deficiency disease   . Microalbuminuria   . CKD (chronic kidney disease) stage 2, GFR 60-89 ml/min     Past  Surgical History:  Procedure Laterality Date  . ABDOMINAL HYSTERECTOMY  2004   Total (due to bladder surgery)  . BLADDER SURGERY     x 2  . BUNIONECTOMY    . CATARACT EXTRACTION W/PHACO Right 11/23/2015   Procedure: CATARACT EXTRACTION PHACO AND INTRAOCULAR LENS PLACEMENT (IOC);  Surgeon: Estill Cotta, MD;  Location: [facility] ORS;  Service: Ophthalmology;  Laterality: Right;  [place]    1:30.9 AP%  26.9 CDE   42.29 flud casette lot # [id number]  exp05/31/2018  . COLONOSCOPY      Prior to Admission medications   Medication Sig Start Date End Date Taking? Authorizing Provider  albuterol (PROAIR HFA) 108 (90 Base) MCG/ACT inhaler INHALE 2 PUFFS INTO THE LUNGS EVERY 6 HOURS AS NEEDED FOR WHEEZING OR SHORTNESS OF BREATH 07/24/17   Johnson, Megan P, DO  amLODipine (NORVASC) 10 MG tablet Take 1 tablet (10 mg total) by mouth daily. 11/28/17   Johnson, Megan P, DO  ARIPiprazole (ABILIFY) 5 MG tablet Take 1 tablet (5 mg total) by mouth daily. 11/28/17   Park Liter P, DO  aspirin EC 81 MG tablet Take 81 mg by mouth daily.    [provider]  atorvastatin (LIPITOR) 40 MG tablet Take 1 tablet (40 mg total) by mouth at bedtime. 11/28/17   Johnson, Megan P, DO  Cholecalciferol (VITAMIN D-3) 1000 units CAPS Take 1 capsule (1,000  Units total) by mouth daily. 11/28/17   Johnson, Megan P, DO  DULoxetine (CYMBALTA) 60 MG capsule Take 1 capsule (60 mg total) by mouth daily. Patient not taking: Reported on 12/18/2017 12/18/17   Park Liter P, DO  fluticasone (FLONASE) 50 MCG/ACT nasal spray Place 2 sprays into both nostrils daily. 11/28/17   Johnson, Megan P, DO  loratadine (CLARITIN) 10 MG tablet Take 1 tablet (10 mg total) by mouth daily. 11/28/17   Johnson, Megan P, DO  losartan (COZAAR) 50 MG tablet Take 1 tablet (50 mg total) by mouth daily. 11/28/17   Johnson, Megan P, DO  omeprazole (PRILOSEC) 20 MG capsule Take 1 capsule (20 mg total) by mouth daily. 11/28/17   Johnson, Megan P, DO  ranitidine (ZANTAC) 150 MG  tablet TAKE 2 TABLETS BY MOUTH EVERY EVENING 12/21/17   Johnson, Megan P, DO  traMADol (ULTRAM) 50 MG tablet Take 1 tablet (50 mg total) by mouth every 8 (eight) hours as needed. 11/09/17   Park Liter P, DO  Vitamin D, Ergocalciferol, (DRISDOL) 50000 units CAPS capsule Take 1 capsule (50,000 Units total) by mouth every 7 (seven) days. 12/14/17   Park Liter P, DO    Allergies Codeine and Lisinopril  Family History  Problem Relation Age of Onset  . AAA (abdominal aortic aneurysm) Mother   . Arthritis Brother   . Heart disease Brother   . Stroke Daughter   . Diabetes Son   . Stroke Son   . Seizures Son   . Diabetes Brother   . Heart disease Brother        7 total heart attacks  . Hyperlipidemia Brother   . Hypertension Brother   . Dementia Brother   . AAA (abdominal aortic aneurysm) Brother   . Stroke Son   . Heart disease Brother   . Dementia Sister     Social History Social History   Tobacco Use  . Smoking status: Former Research scientist (life sciences)  . Smokeless tobacco: Never Used  . Tobacco comment: quit 20 years ago   Substance Use Topics  . Alcohol use: Yes    Comment: OCCAS  . Drug use: No    Review of Systems  Constitutional: No fever. Eyes: No redness. ENT: No sore throat. Cardiovascular: Denies chest pain. Respiratory: Denies shortness of breath. Gastrointestinal: No vomiting. Genitourinary: Negative for dysuria.  Musculoskeletal: Negative for back pain. Skin: Negative for rash. Neurological: Negative for headache.   ____________________________________________   PHYSICAL EXAM:  VITAL SIGNS: ED Triage Vitals  Enc Vitals Group     BP --      Pulse --      Resp --      Temp 01/15/18 0744 98.6 F (37 C)     Temp Source 01/15/18 0744 Oral     SpO2 --      Weight 01/15/18 0745 140 lb (63.5 kg)     Height 01/15/18 0745 5' (1.524 m)     Head Circumference --      Peak Flow --      Pain Score 01/15/18 0744 7     Pain Loc --      Pain Edu? --      Excl. in  [place]? --     Constitutional: Alert and oriented. Well appearing and in no acute distress. Eyes: Conjunctivae are normal.  Head: Atraumatic. Nose: No congestion/rhinnorhea. Mouth/Throat: Mucous membranes are moist.   Neck: Normal range of motion.  Cardiovascular:   Good peripheral circulation. Respiratory: Normal respiratory effort.  Gastrointestinal: Soft and nontender. No distention.  No impacted stool on DRE. Genitourinary: No flank tenderness. Musculoskeletal: Extremities warm and well perfused.  Neurologic:  Normal speech and language. No gross focal neurologic deficits are appreciated.  Skin:  Skin is warm and dry. No rash noted. Psychiatric: Mood and affect are normal. Speech and behavior are normal.  ____________________________________________   LABS (all labs ordered are listed, but only abnormal results are displayed)  Labs Reviewed - No data to display ____________________________________________  EKG   ____________________________________________  RADIOLOGY  Abdominal x-ray: Constipation with normal bowel gas pattern  ____________________________________________   PROCEDURES  Procedure(s) performed: No  Procedures  Critical Care performed: No ____________________________________________   INITIAL IMPRESSION / ASSESSMENT AND PLAN / ED COURSE  Pertinent labs & imaging results that were available during my care of the patient were reviewed by me and considered in my medical decision making (see chart for details).  77 year old female with PMH as noted above and status post admission last month for a possible TIA presents with constipation over the last several weeks, with some crampy abdominal pain and nausea.  On exam, the patient is well-appearing, vital signs are normal, and the exam is as described above.  The abdomen is soft and nontender.  No impacted stool found on DRE.  Overall presentation is consistent with simple constipation, most likely  medication related.  We will obtain an abdominal x-ray to confirm presence of stool and rule out obstruction.  Anticipate discharge home with additional medications if normal.  ----------------------------------------- 9:49 AM on 01/15/2018 -----------------------------------------  X-ray shows no abnormal findings.  I will prescribe magnesium citrate and Fleet enema.  Return precautions given, and the patient expresses understanding.     ____________________________________________   FINAL CLINICAL IMPRESSION(S) / ED DIAGNOSES  Final diagnoses:  Constipation      NEW MEDICATIONS STARTED DURING THIS VISIT:  New Prescriptions   No medications on file     Note:  This document was prepared using Dragon voice recognition software and may include unintentional dictation errors.    Arta Silence, MD 01/15/18 [phone]

## 2018-01-15 NOTE — ED Notes (Signed)

## 2018-01-19 ENCOUNTER — Ambulatory Visit: Payer: Medicare Other | Admitting: Family Medicine

## 2018-01-24 DIAGNOSIS — H2512 Age-related nuclear cataract, left eye: Secondary | ICD-10-CM | POA: Diagnosis not present

## 2018-01-30 ENCOUNTER — Ambulatory Visit (INDEPENDENT_AMBULATORY_CARE_PROVIDER_SITE_OTHER): Payer: Medicare Other | Admitting: Family Medicine

## 2018-01-30 ENCOUNTER — Encounter: Payer: Self-pay | Admitting: Family Medicine

## 2018-01-30 VITALS — BP 141/85 | HR 78 | Temp 98.4°F | Wt 137.1 lb

## 2018-01-30 DIAGNOSIS — R413 Other amnesia: Secondary | ICD-10-CM | POA: Diagnosis not present

## 2018-01-30 DIAGNOSIS — M48061 Spinal stenosis, lumbar region without neurogenic claudication: Secondary | ICD-10-CM | POA: Diagnosis not present

## 2018-01-30 DIAGNOSIS — I639 Cerebral infarction, unspecified: Secondary | ICD-10-CM

## 2018-01-30 MED ORDER — GABAPENTIN 100 MG PO CAPS: 100.0000 mg | ORAL_CAPSULE | Freq: Three times a day (TID) | ORAL | 3 refills | Status: DC

## 2018-01-30 MED ORDER — TRAMADOL HCL 50 MG PO TABS: 50.0000 mg | ORAL_TABLET | Freq: Three times a day (TID) | ORAL | 0 refills | Status: DC | PRN

## 2018-01-30 NOTE — Assessment & Plan Note (Signed)
Has been lost to follow up for neurosurgery. Will get her back in if able. Will get her PT at home. Will refill her tramadol for PRN use and will restart her gabapentin at 100mg  TID with hopes of it not causing confusion. Recheck 1 month.

## 2018-01-30 NOTE — Patient Instructions (Signed)
You have an appointment on 03/21/18 with  Allyne Gee, MD   Neurology   NPI: [id number]   [place] [address]      Phone: [phone]   Fax: [phone]   If you are out of town during this time, please call and reschedule this appointment.

## 2018-01-30 NOTE — Progress Notes (Signed)
BP (!) 141/85 (BP Location: Right Arm, Patient Position: Sitting, Cuff Size: Normal)   Pulse 78   Temp 98.4 F (36.9 C)   Wt 137 lb 2 oz (62.2 kg)   LMP  (LMP Unknown)   SpO2 96%   BMI 26.78 kg/m    Subjective:    Patient ID: Cynthia Vaughan, female    DOB: January 21, 1941, 77 y.o.   MRN: [id number]  HPI: Cynthia Vaughan is a 77 y.o. female  Chief Complaint  Patient presents with  . Hip Pain  . Knee Pain   Mel presents today with her husband. She notes that she is not doing well. She has been in a lot of pain in her hip and her knee. She is off the gabapentin due to confusion. Her husband notes that the confusion seems better, but she still appears very confused on exam today. She notes that she has not followed up with the neurosurgeon. She was recommended to do PT and then follow up with him. She has not done any PT in almost 2 months. She states that she does some exercises at home "when she thinks of it" and "when she's not in pain." She is unable to tell me what she does or how often. She states that she does not understand why she needs to do PT when she can barely get around her house. She notes that she is in constant pain. Her pain is aching and sore and occasionally her knee pops. She notes that her pain gets better with rest and worse with movement. Pain radiates down her R leg. She missed her follow up appointment with neurosurgery last week. She did not call to reschedule it. It is unclear if we are going to be able to get her rescheduled.   Confusion has improved off the gabapentin and the buspar per her husband. She is otherwise doing OK with no other concerns or complaints at this time.   Relevant past medical, surgical, family and social history reviewed and updated as indicated. Interim medical history since our last visit reviewed. Allergies and medications reviewed and updated.  Review of Systems  Constitutional: Negative.   Respiratory: Negative.   Cardiovascular:  Negative.   Gastrointestinal: Negative.   Musculoskeletal: Positive for arthralgias, back pain, gait problem and myalgias. Negative for joint swelling, neck pain and neck stiffness.  Skin: Negative.   Neurological: Positive for weakness and numbness. Negative for dizziness, tremors, seizures, syncope, facial asymmetry, speech difficulty, light-headedness and headaches.  Psychiatric/Behavioral: Negative.     Per HPI unless specifically indicated above     Objective:    BP (!) 141/85 (BP Location: Right Arm, Patient Position: Sitting, Cuff Size: Normal)   Pulse 78   Temp 98.4 F (36.9 C)   Wt 137 lb 2 oz (62.2 kg)   LMP  (LMP Unknown)   SpO2 96%   BMI 26.78 kg/m   Wt Readings from Last 3 Encounters:  01/30/18 137 lb 2 oz (62.2 kg)  01/15/18 140 lb (63.5 kg)  12/22/17 141 lb 7 oz (64.2 kg)    Physical Exam  Constitutional: She is oriented to person, place, and time. She appears well-developed and well-nourished. No distress.  HENT:  Head: Normocephalic and atraumatic.  Right Ear: Hearing normal.  Left Ear: Hearing normal.  Nose: Nose normal.  Eyes: Conjunctivae and lids are normal. Right eye exhibits no discharge. Left eye exhibits no discharge. No scleral icterus.  Cardiovascular: Normal rate, regular rhythm, normal heart  sounds and intact distal pulses. Exam reveals no gallop and no friction rub.  No murmur heard. Pulmonary/Chest: Effort normal and breath sounds normal. No stridor. No respiratory distress. She has no wheezes. She has no rales. She exhibits no tenderness.  Musculoskeletal: She exhibits tenderness. She exhibits no edema or deformity.  Neurological: She is alert and oriented to person, place, and time.  Skin: Skin is warm, dry and intact. Capillary refill takes less than 2 seconds. No rash noted. She is not diaphoretic. No erythema. No pallor.  Psychiatric: She has a normal mood and affect. Her speech is normal and behavior is normal. Judgment and thought content  normal. Cognition and memory are normal.    Results for orders placed or performed during the hospital encounter of 12/18/17  Protime-INR  Result Value Ref Range   Prothrombin Time 12.1 11.4 - 15.2 seconds   INR 0.90   APTT  Result Value Ref Range   aPTT 24 24 - 36 seconds  CBC  Result Value Ref Range   WBC 11.7 (H) 3.6 - 11.0 K/uL   RBC 4.51 3.80 - 5.20 MIL/uL   Hemoglobin 14.2 12.0 - 16.0 g/dL   HCT 42.5 35.0 - 47.0 %   MCV 94.3 80.0 - 100.0 fL   MCH 31.4 26.0 - 34.0 pg   MCHC 33.3 32.0 - 36.0 g/dL   RDW 12.9 11.5 - 14.5 %   Platelets 231 150 - 440 K/uL  Differential  Result Value Ref Range   Neutrophils Relative % 76 %   Neutro Abs 9.0 (H) 1.4 - 6.5 K/uL   Lymphocytes Relative 14 %   Lymphs Abs 1.6 1.0 - 3.6 K/uL   Monocytes Relative 7 %   Monocytes Absolute 0.9 0.2 - 0.9 K/uL   Eosinophils Relative 2 %   Eosinophils Absolute 0.2 0 - 0.7 K/uL   Basophils Relative 1 %   Basophils Absolute 0.1 0 - 0.1 K/uL  Comprehensive metabolic panel  Result Value Ref Range   Sodium 139 135 - 145 mmol/L   Potassium 3.3 (L) 3.5 - 5.1 mmol/L   Chloride 103 101 - 111 mmol/L   CO2 26 22 - 32 mmol/L   Glucose, Bld 141 (H) 65 - 99 mg/dL   BUN 11 6 - 20 mg/dL   Creatinine, Ser 0.70 0.44 - 1.00 mg/dL   Calcium 9.2 8.9 - 10.3 mg/dL   Total Protein 7.7 6.5 - 8.1 g/dL   Albumin 4.4 3.5 - 5.0 g/dL   AST 27 15 - 41 U/L   ALT 25 14 - 54 U/L   Alkaline Phosphatase 87 38 - 126 U/L   Total Bilirubin 0.4 0.3 - 1.2 mg/dL   GFR calc non Af Amer >60 >60 mL/min   GFR calc Af Amer >60 >60 mL/min   Anion gap 10 5 - 15  Troponin I  Result Value Ref Range   Troponin I <0.03 <0.03 ng/mL  Glucose, capillary  Result Value Ref Range   Glucose-Capillary 124 (H) 65 - 99 mg/dL  Hemoglobin A1c  Result Value Ref Range   Hgb A1c MFr Bld 5.5 4.8 - 5.6 %   Mean Plasma Glucose 111.15 mg/dL  Lipid panel  Result Value Ref Range   Cholesterol 170 0 - 200 mg/dL   Triglycerides 60 <150 mg/dL   HDL 60 >40  mg/dL   Total CHOL/HDL Ratio 2.8 RATIO   VLDL 12 0 - 40 mg/dL   LDL Cholesterol 98 0 - 99 mg/dL  ECHOCARDIOGRAM COMPLETE  Result Value Ref Range   Weight 2,336 oz   Height 59 in   BP 142/85 mmHg      Assessment & Plan:   Problem List Items Addressed This Visit      Other   Spinal stenosis of lumbar region - Primary    Has been lost to follow up for neurosurgery. Will get her back in if able. Will get her PT at home. Will refill her tramadol for PRN use and will restart her gabapentin at 100mg  TID with hopes of it not causing confusion. Recheck 1 month.       Relevant Orders   Ambulatory referral to [facility]    Other Visit Diagnoses    Memory impairment       Not clearly better. To see neurology- appointment is scheduled. Husband made aware of when appointment is. Will aslo get her home health.    Relevant Orders   Ambulatory referral to Home Health       Follow up plan: Return in about 1 month (around 02/27/2018).

## 2018-01-31 ENCOUNTER — Telehealth: Payer: Self-pay | Admitting: Family Medicine

## 2018-01-31 NOTE — Telephone Encounter (Signed)
Routing to provider for verbal orders 

## 2018-01-31 NOTE — Telephone Encounter (Signed)
Copied from [place] [id number]. Topic: General - Other >> Jan 31, 2018 12:42 PM Keene Breath wrote: Reason for CRM: Margarita Grizzle with [facility] called to request orders for Nursing care  1 wk x3 and monitoring for disease management.  CB # [id number].

## 2018-01-31 NOTE — Telephone Encounter (Signed)
OK to give verbal orders 

## 2018-01-31 NOTE — Telephone Encounter (Signed)
Called and let Margarita Grizzle know that Dr. Wynetta Emery gave the [place] for verbal orders.

## 2018-02-07 ENCOUNTER — Ambulatory Visit: Payer: Medicare Other | Admitting: Podiatry

## 2018-02-27 ENCOUNTER — Other Ambulatory Visit: Payer: Self-pay

## 2018-02-27 ENCOUNTER — Encounter: Payer: Self-pay | Admitting: Family Medicine

## 2018-02-27 ENCOUNTER — Ambulatory Visit (INDEPENDENT_AMBULATORY_CARE_PROVIDER_SITE_OTHER): Payer: Medicare Other | Admitting: Family Medicine

## 2018-02-27 VITALS — BP 133/80 | HR 79 | Temp 98.4°F | Ht 60.0 in | Wt 141.5 lb

## 2018-02-27 DIAGNOSIS — M48062 Spinal stenosis, lumbar region with neurogenic claudication: Secondary | ICD-10-CM | POA: Diagnosis not present

## 2018-02-27 DIAGNOSIS — K219 Gastro-esophageal reflux disease without esophagitis: Secondary | ICD-10-CM

## 2018-02-27 DIAGNOSIS — Z6827 Body mass index (BMI) 27.0-27.9, adult: Secondary | ICD-10-CM | POA: Diagnosis not present

## 2018-02-27 DIAGNOSIS — R413 Other amnesia: Secondary | ICD-10-CM | POA: Insufficient documentation

## 2018-02-27 DIAGNOSIS — I10 Essential (primary) hypertension: Secondary | ICD-10-CM | POA: Diagnosis not present

## 2018-02-27 DIAGNOSIS — M4726 Other spondylosis with radiculopathy, lumbar region: Secondary | ICD-10-CM

## 2018-02-27 DIAGNOSIS — I639 Cerebral infarction, unspecified: Secondary | ICD-10-CM | POA: Diagnosis not present

## 2018-02-27 MED ORDER — TRAMADOL HCL 50 MG PO TABS: 50.0000 mg | ORAL_TABLET | Freq: Three times a day (TID) | ORAL | 0 refills | Status: DC | PRN

## 2018-02-27 MED ORDER — OMEPRAZOLE 20 MG PO CPDR: 20.0000 mg | DELAYED_RELEASE_CAPSULE | Freq: Every day | ORAL | 1 refills | Status: DC

## 2018-02-27 NOTE — Assessment & Plan Note (Signed)
Will restart omeprazole. Call with any concerns.  ?

## 2018-02-27 NOTE — Assessment & Plan Note (Addendum)
No better. Has done some home PT- did not complete course as she refused it. Currently on 100mg  gabapentin TID and 60mg  cymbalta. Unsure if it's helping. Cannot increase gabapentin as it increases her confusion- which is already significant. Taking tramadol PRN- helps some time, but not all the time. Has an appointment with neurosurgery this afternoon. Refills given today. Call with any concerns.

## 2018-02-27 NOTE — Progress Notes (Signed)
BP 133/80   Pulse 79   Temp 98.4 F (36.9 C) (Oral)   Ht 5' (1.524 m)   Wt 141 lb 8 oz (64.2 kg)   LMP  (LMP Unknown)   SpO2 98%   BMI 27.63 kg/m    Subjective:    Patient ID: Cynthia Vaughan, female    DOB: 1941/04/25, 77 y.o.   MRN: [id number]  HPI: Cynthia Vaughan is a 77 y.o. female  Chief Complaint  Patient presents with  . Pain    right leg/ pt states having hard times with doing things/ pt states she might need to go back to omeprazole as ranitidine is not helping    Jahnay presents today with her husband. She notes that her R leg is feeling worse. She notes that it is weak and has shooting pain going down it. She notes that tramadol seems to be helping- taking with tylenol in the AM. It helps most of the time, but not all the time. She is not sure if the cymbalta or the gabapentin is helping with the pain. Her husband notes that she doesn't seem more confused with the lower dose of the gabapentin. She is not sure when she is supposed to follow up with neurosurgery. She notes that she is having a very difficult time getting around. Last visit we got home health in to try to help with ADLs and to get some PT. Patient did it for a little bit, but then told PT that she didn't want to do it any more, and it was stopped. Unclear exactly how long she has done PT. Patient is very poor historian.   GERD GERD control status: exacerbated  Satisfied with current treatment? no- has not been taking her omeprazole and would like to restart it Heartburn frequency: daily Medication side effects: no  Medication compliance: fair Dysphagia: no Odynophagia:  no Hematemesis: no Blood in stool: no EGD: no  Relevant past medical, surgical, family and social history reviewed and updated as indicated. Interim medical history since our last visit reviewed. Allergies and medications reviewed and updated.  Review of Systems  Constitutional: Negative.   Respiratory: Negative.   Cardiovascular:  Negative.   Musculoskeletal: Positive for back pain and myalgias. Negative for arthralgias, gait problem, joint swelling, neck pain and neck stiffness.  Skin: Negative.   Neurological: Positive for numbness. Negative for dizziness, tremors, seizures, syncope, facial asymmetry, speech difficulty, weakness, light-headedness and headaches.  Psychiatric/Behavioral: Negative.     Per HPI unless specifically indicated above     Objective:    BP 133/80   Pulse 79   Temp 98.4 F (36.9 C) (Oral)   Ht 5' (1.524 m)   Wt 141 lb 8 oz (64.2 kg)   LMP  (LMP Unknown)   SpO2 98%   BMI 27.63 kg/m   Wt Readings from Last 3 Encounters:  02/27/18 141 lb 8 oz (64.2 kg)  01/30/18 137 lb 2 oz (62.2 kg)  01/15/18 140 lb (63.5 kg)    Physical Exam  Constitutional: She is oriented to person, place, and time. She appears well-developed and well-nourished. No distress.  HENT:  Head: Normocephalic and atraumatic.  Right Ear: Hearing normal.  Left Ear: Hearing normal.  Nose: Nose normal.  Eyes: Conjunctivae and lids are normal. Right eye exhibits no discharge. Left eye exhibits no discharge. No scleral icterus.  Cardiovascular: Normal rate, regular rhythm, normal heart sounds and intact distal pulses. Exam reveals no gallop and no friction rub.  No murmur heard. Pulmonary/Chest: Effort normal and breath sounds normal. No stridor. No respiratory distress. She has no wheezes. She has no rales. She exhibits no tenderness.  Musculoskeletal: Normal range of motion.  Neurological: She is alert and oriented to person, place, and time.  Skin: Skin is warm, dry and intact. Capillary refill takes less than 2 seconds. No rash noted. She is not diaphoretic. No erythema. No pallor.  Psychiatric: She has a normal mood and affect. Her speech is normal and behavior is normal. Judgment and thought content normal. Cognition and memory are normal.  Nursing note and vitals reviewed.   Results for orders placed or  performed during the hospital encounter of 12/18/17  Protime-INR  Result Value Ref Range   Prothrombin Time 12.1 11.4 - 15.2 seconds   INR 0.90   APTT  Result Value Ref Range   aPTT 24 24 - 36 seconds  CBC  Result Value Ref Range   WBC 11.7 (H) 3.6 - 11.0 K/uL   RBC 4.51 3.80 - 5.20 MIL/uL   Hemoglobin 14.2 12.0 - 16.0 g/dL   HCT 42.5 35.0 - 47.0 %   MCV 94.3 80.0 - 100.0 fL   MCH 31.4 26.0 - 34.0 pg   MCHC 33.3 32.0 - 36.0 g/dL   RDW 12.9 11.5 - 14.5 %   Platelets 231 150 - 440 K/uL  Differential  Result Value Ref Range   Neutrophils Relative % 76 %   Neutro Abs 9.0 (H) 1.4 - 6.5 K/uL   Lymphocytes Relative 14 %   Lymphs Abs 1.6 1.0 - 3.6 K/uL   Monocytes Relative 7 %   Monocytes Absolute 0.9 0.2 - 0.9 K/uL   Eosinophils Relative 2 %   Eosinophils Absolute 0.2 0 - 0.7 K/uL   Basophils Relative 1 %   Basophils Absolute 0.1 0 - 0.1 K/uL  Comprehensive metabolic panel  Result Value Ref Range   Sodium 139 135 - 145 mmol/L   Potassium 3.3 (L) 3.5 - 5.1 mmol/L   Chloride 103 101 - 111 mmol/L   CO2 26 22 - 32 mmol/L   Glucose, Bld 141 (H) 65 - 99 mg/dL   BUN 11 6 - 20 mg/dL   Creatinine, Ser 0.70 0.44 - 1.00 mg/dL   Calcium 9.2 8.9 - 10.3 mg/dL   Total Protein 7.7 6.5 - 8.1 g/dL   Albumin 4.4 3.5 - 5.0 g/dL   AST 27 15 - 41 U/L   ALT 25 14 - 54 U/L   Alkaline Phosphatase 87 38 - 126 U/L   Total Bilirubin 0.4 0.3 - 1.2 mg/dL   GFR calc non Af Amer >60 >60 mL/min   GFR calc Af Amer >60 >60 mL/min   Anion gap 10 5 - 15  Troponin I  Result Value Ref Range   Troponin I <0.03 <0.03 ng/mL  Glucose, capillary  Result Value Ref Range   Glucose-Capillary 124 (H) 65 - 99 mg/dL  Hemoglobin A1c  Result Value Ref Range   Hgb A1c MFr Bld 5.5 4.8 - 5.6 %   Mean Plasma Glucose 111.15 mg/dL  Lipid panel  Result Value Ref Range   Cholesterol 170 0 - 200 mg/dL   Triglycerides 60 <150 mg/dL   HDL 60 >40 mg/dL   Total CHOL/HDL Ratio 2.8 RATIO   VLDL 12 0 - 40 mg/dL   LDL  Cholesterol 98 0 - 99 mg/dL  ECHOCARDIOGRAM COMPLETE  Result Value Ref Range   Weight 2,336 oz  Height 59 in   BP 142/85 mmHg      Assessment & Plan:   Problem List Items Addressed This Visit      Digestive   GERD (gastroesophageal reflux disease)    Will restart omeprazole. Call with any concerns.       Relevant Medications   omeprazole (PRILOSEC) 20 MG capsule     Musculoskeletal and Integument   Degenerative joint disease (DJD) of lumbar spine - Primary    No better. Has done some home PT- did not complete course as she refused it. Currently on 100mg  gabapentin TID and 60mg  cymbalta. Unsure if it's helping. Cannot increase gabapentin as it increases her confusion- which is already significant. Taking tramadol PRN- helps some time, but not all the time. Has an appointment with neurosurgery this afternoon. Refills given today. Call with any concerns.       Relevant Medications   traMADol (ULTRAM) 50 MG tablet     Other   Memory impairment    Seems to be worsening, unclear if this is medication related. To see neurology in about 3 weeks. Husband here today- watching her more closely.          Follow up plan: Return 1-2 months.

## 2018-02-27 NOTE — Assessment & Plan Note (Addendum)
Seems to be worsening, unclear if this is medication related. To see neurology in about 3 weeks. Husband here today- watching her more closely.

## 2018-03-03 ENCOUNTER — Other Ambulatory Visit: Payer: Self-pay | Admitting: Family Medicine

## 2018-03-12 DIAGNOSIS — M48062 Spinal stenosis, lumbar region with neurogenic claudication: Secondary | ICD-10-CM | POA: Diagnosis not present

## 2018-03-12 DIAGNOSIS — I10 Essential (primary) hypertension: Secondary | ICD-10-CM | POA: Diagnosis not present

## 2018-03-12 DIAGNOSIS — Z6827 Body mass index (BMI) 27.0-27.9, adult: Secondary | ICD-10-CM | POA: Diagnosis not present

## 2018-03-13 ENCOUNTER — Other Ambulatory Visit: Payer: Self-pay | Admitting: Family Medicine

## 2018-03-13 DIAGNOSIS — M48062 Spinal stenosis, lumbar region with neurogenic claudication: Secondary | ICD-10-CM | POA: Diagnosis not present

## 2018-04-03 ENCOUNTER — Encounter: Payer: Self-pay | Admitting: Physician Assistant

## 2018-04-03 ENCOUNTER — Ambulatory Visit (INDEPENDENT_AMBULATORY_CARE_PROVIDER_SITE_OTHER): Payer: Medicare Other | Admitting: Physician Assistant

## 2018-04-03 ENCOUNTER — Other Ambulatory Visit: Payer: Self-pay

## 2018-04-03 VITALS — BP 137/91 | HR 85 | Temp 98.3°F | Ht 60.0 in | Wt 144.0 lb

## 2018-04-03 DIAGNOSIS — F419 Anxiety disorder, unspecified: Secondary | ICD-10-CM

## 2018-04-03 DIAGNOSIS — F331 Major depressive disorder, recurrent, moderate: Secondary | ICD-10-CM

## 2018-04-03 DIAGNOSIS — R296 Repeated falls: Secondary | ICD-10-CM

## 2018-04-03 NOTE — Progress Notes (Signed)
Subjective:    Patient ID: Cynthia Vaughan, female    DOB: Sep 25, 1940, 77 y.o.   MRN: [id number]  Cynthia Vaughan is a 77 y.o. female presenting on 04/03/2018 for Fatigue (pt states she feels tired most of the time and state seems like medication is not helping with her tiredness and pain) and Pain (pt states she fell twice last week at home)   HPI   PMH history of spinal stenosis which she sees neurosurgery for, history of falls and uncontrolled anxiety.   She says she feels nervous all the time. She is on Abilify 5 mg daily and cymbalta 60 mg daily. She has just come off Buspar, said it made her fuzzy. Gabapentin dose reduced.   She also says she has fallen twice in the past week. She has been referred to [facility] neurology in the past but then declined to schedule when contacted.   Social History   Tobacco Use  . Smoking status: Former Research scientist (life sciences)  . Smokeless tobacco: Never Used  . Tobacco comment: quit 20 years ago   Substance Use Topics  . Alcohol use: Yes    Comment: OCCAS  . Drug use: No    Review of Systems Per HPI unless specifically indicated above     Objective:    BP (!) 137/91   Pulse 85   Temp 98.3 F (36.8 C) (Tympanic)   Ht 5' (1.524 m)   Wt 144 lb (65.3 kg)   LMP  (LMP Unknown)   SpO2 97%   BMI 28.12 kg/m   Wt Readings from Last 3 Encounters:  04/03/18 144 lb (65.3 kg)  02/27/18 141 lb 8 oz (64.2 kg)  01/30/18 137 lb 2 oz (62.2 kg)    Physical Exam  Constitutional: She is oriented to person, place, and time. She appears well-developed and well-nourished.  Cardiovascular: Normal rate and regular rhythm.  Pulmonary/Chest: Effort normal and breath sounds normal.  Neurological: She is alert and oriented to person, place, and time. Abnormal reflex:    Skin: Skin is warm and dry.  Psychiatric: Her behavior is normal. Her affect is blunt.   Results for orders placed or performed during the hospital encounter of 12/18/17  Protime-INR  Result Value Ref  Range   Prothrombin Time 12.1 11.4 - 15.2 seconds   INR 0.90   APTT  Result Value Ref Range   aPTT 24 24 - 36 seconds  CBC  Result Value Ref Range   WBC 11.7 (H) 3.6 - 11.0 K/uL   RBC 4.51 3.80 - 5.20 MIL/uL   Hemoglobin 14.2 12.0 - 16.0 g/dL   HCT 42.5 35.0 - 47.0 %   MCV 94.3 80.0 - 100.0 fL   MCH 31.4 26.0 - 34.0 pg   MCHC 33.3 32.0 - 36.0 g/dL   RDW 12.9 11.5 - 14.5 %   Platelets 231 150 - 440 K/uL  Differential  Result Value Ref Range   Neutrophils Relative % 76 %   Neutro Abs 9.0 (H) 1.4 - 6.5 K/uL   Lymphocytes Relative 14 %   Lymphs Abs 1.6 1.0 - 3.6 K/uL   Monocytes Relative 7 %   Monocytes Absolute 0.9 0.2 - 0.9 K/uL   Eosinophils Relative 2 %   Eosinophils Absolute 0.2 0 - 0.7 K/uL   Basophils Relative 1 %   Basophils Absolute 0.1 0 - 0.1 K/uL  Comprehensive metabolic panel  Result Value Ref Range   Sodium 139 135 - 145 mmol/L   Potassium  3.3 (L) 3.5 - 5.1 mmol/L   Chloride 103 101 - 111 mmol/L   CO2 26 22 - 32 mmol/L   Glucose, Bld 141 (H) 65 - 99 mg/dL   BUN 11 6 - 20 mg/dL   Creatinine, Ser 0.70 0.44 - 1.00 mg/dL   Calcium 9.2 8.9 - 10.3 mg/dL   Total Protein 7.7 6.5 - 8.1 g/dL   Albumin 4.4 3.5 - 5.0 g/dL   AST 27 15 - 41 U/L   ALT 25 14 - 54 U/L   Alkaline Phosphatase 87 38 - 126 U/L   Total Bilirubin 0.4 0.3 - 1.2 mg/dL   GFR calc non Af Amer >60 >60 mL/min   GFR calc Af Amer >60 >60 mL/min   Anion gap 10 5 - 15  Troponin I  Result Value Ref Range   Troponin I <0.03 <0.03 ng/mL  Glucose, capillary  Result Value Ref Range   Glucose-Capillary 124 (H) 65 - 99 mg/dL  Hemoglobin A1c  Result Value Ref Range   Hgb A1c MFr Bld 5.5 4.8 - 5.6 %   Mean Plasma Glucose 111.15 mg/dL  Lipid panel  Result Value Ref Range   Cholesterol 170 0 - 200 mg/dL   Triglycerides 60 <150 mg/dL   HDL 60 >40 mg/dL   Total CHOL/HDL Ratio 2.8 RATIO   VLDL 12 0 - 40 mg/dL   LDL Cholesterol 98 0 - 99 mg/dL  ECHOCARDIOGRAM COMPLETE  Result Value Ref Range   Weight  2,336 oz   Height 59 in   BP 142/85 mmHg      Assessment & Plan:  1. Anxiety  Had extensive conversation about psychiatric referral. She declines and wants to focus on her neurology referral first. Do think she should psychiatry.  2. Moderate episode of recurrent major depressive disorder ([place])  See above.   3. Falls frequently  She has been referred to neurology before by Dr. Wynetta Emery. Had extensive discussion today about necessity of this. They agree to neurology referral. I have provided her husband hal's number.    [phone] - Husband's cellphone [name]   - Ambulatory referral to Neurology   I have spent 25 minutes with this patient, >50% of which was spent on counseling and coordination of care.   Follow up plan: No follow-ups on file.  Carles Collet, PA-C [facility] 04/04/2018, 12:37 PM

## 2018-04-04 ENCOUNTER — Other Ambulatory Visit: Payer: Self-pay | Admitting: Family Medicine

## 2018-04-04 NOTE — Patient Instructions (Signed)

## 2018-04-05 NOTE — Telephone Encounter (Signed)
Request for Tramadol. Last fill date 02/27/2018 Last Visit 04/03/2018

## 2018-04-05 NOTE — Telephone Encounter (Signed)
Dr. Wynetta Emery wrote for this on 02/27/2018 but patient didn't get it filled. She should still have this prescription.

## 2018-04-09 NOTE — Telephone Encounter (Signed)
Please see above message, Dr. Wynetta Emery already wrote for this

## 2018-04-10 NOTE — Telephone Encounter (Signed)
Patient has the medication.

## 2018-04-13 ENCOUNTER — Other Ambulatory Visit: Payer: Self-pay | Admitting: Family Medicine

## 2018-04-13 NOTE — Telephone Encounter (Signed)
Cymbalta 60 mg refill Last Refill:12/18/17 # 90 Last OV: 04/03/18 PCP: Dr. Wynetta Emery Pharmacy:[facility] - [place], [place] - [place].  I get a message that Dr. Wynetta Emery is not a e-prescribing provider with [employer].   Wasn't sure how to proceed with this.   Thanks.

## 2018-04-30 ENCOUNTER — Ambulatory Visit: Payer: Medicare Other

## 2018-05-13 ENCOUNTER — Other Ambulatory Visit: Payer: Self-pay | Admitting: Family Medicine

## 2018-05-24 ENCOUNTER — Other Ambulatory Visit: Payer: Self-pay | Admitting: Family Medicine

## 2018-05-24 NOTE — Telephone Encounter (Signed)
Requested medication (s) are due for refill today: yes  Requested medication (s) are on the active medication list: yes  Last refill:  04/05/18  Future visit scheduled: no  Notes to clinic:  undelegated    Requested Prescriptions  Pending Prescriptions Disp Refills   traMADol (ULTRAM) 50 MG tablet [Pharmacy Med Name: TRAMADOL 50MG  TABLETS] 90 tablet 0    Sig: TAKE 1 TABLET(50 MG) BY MOUTH EVERY 8 HOURS AS NEEDED     Not Delegated - Analgesics:  Opioid Agonists Failed - 05/24/2018 12:08 PM      Failed - This refill cannot be delegated      Failed - Urine Drug Screen completed in last 360 days.      Passed - Valid encounter within last 6 months    Recent Outpatient Visits          1 month ago [place], [place], [place]   2 months ago Osteoarthritis of spine with radiculopathy, lumbar region   [facility], [place] P, DO   3 months ago Spinal stenosis of lumbar region, unspecified whether neurogenic claudication present   [facility], Megan P, DO   5 months ago TIA (transient ischemic attack)   [place], DO   5 months ago Impaired orientation   [place], [place], DO

## 2018-06-01 ENCOUNTER — Other Ambulatory Visit: Payer: Self-pay

## 2018-06-01 ENCOUNTER — Encounter: Payer: Self-pay | Admitting: Family Medicine

## 2018-06-01 ENCOUNTER — Ambulatory Visit (INDEPENDENT_AMBULATORY_CARE_PROVIDER_SITE_OTHER): Payer: Medicare Other | Admitting: Family Medicine

## 2018-06-01 VITALS — BP 124/84 | HR 88 | Temp 98.4°F | Ht 60.0 in | Wt 141.0 lb

## 2018-06-01 DIAGNOSIS — M4726 Other spondylosis with radiculopathy, lumbar region: Secondary | ICD-10-CM

## 2018-06-01 DIAGNOSIS — E559 Vitamin D deficiency, unspecified: Secondary | ICD-10-CM

## 2018-06-01 DIAGNOSIS — E782 Mixed hyperlipidemia: Secondary | ICD-10-CM

## 2018-06-01 DIAGNOSIS — F331 Major depressive disorder, recurrent, moderate: Secondary | ICD-10-CM | POA: Diagnosis not present

## 2018-06-01 DIAGNOSIS — R413 Other amnesia: Secondary | ICD-10-CM

## 2018-06-01 DIAGNOSIS — F419 Anxiety disorder, unspecified: Secondary | ICD-10-CM

## 2018-06-01 DIAGNOSIS — S51801A Unspecified open wound of right forearm, initial encounter: Secondary | ICD-10-CM

## 2018-06-01 DIAGNOSIS — N182 Chronic kidney disease, stage 2 (mild): Secondary | ICD-10-CM | POA: Diagnosis not present

## 2018-06-01 DIAGNOSIS — I639 Cerebral infarction, unspecified: Secondary | ICD-10-CM

## 2018-06-01 DIAGNOSIS — Z23 Encounter for immunization: Secondary | ICD-10-CM

## 2018-06-01 DIAGNOSIS — I129 Hypertensive chronic kidney disease with stage 1 through stage 4 chronic kidney disease, or unspecified chronic kidney disease: Secondary | ICD-10-CM

## 2018-06-01 MED ORDER — TRAMADOL HCL 50 MG PO TABS: 50.0000 mg | ORAL_TABLET | Freq: Two times a day (BID) | ORAL | 1 refills | Status: DC | PRN

## 2018-06-01 MED ORDER — DULOXETINE HCL 60 MG PO CPEP: 60.0000 mg | ORAL_CAPSULE | Freq: Every day | ORAL | 1 refills | Status: DC

## 2018-06-01 MED ORDER — RANITIDINE HCL 150 MG PO TABS: 300.0000 mg | ORAL_TABLET | Freq: Every evening | ORAL | 1 refills | Status: DC

## 2018-06-01 NOTE — Assessment & Plan Note (Signed)
Under good control on current regimen. Continue current regimen. Continue to monitor. Call with any concerns. Refills given.   

## 2018-06-01 NOTE — Progress Notes (Signed)
BP 124/84   Pulse 88   Temp 98.4 F (36.9 C) (Oral)   Ht 5' (1.524 m)   Wt 141 lb (64 kg)   LMP  (LMP Unknown)   SpO2 98%   BMI 27.54 kg/m    Subjective:    Patient ID: Cynthia Vaughan, female    DOB: March 20, 1941, 77 y.o.   MRN: [id number]  HPI: Cynthia Vaughan is a 77 y.o. female  Chief Complaint  Patient presents with  . Hypertension    pt states she has been out of tramadol for about a week, states needs a refill  . Hyperlipidemia  . Anxiety   HYPERTENSION / HYPERLIPIDEMIA Satisfied with current treatment? yes Duration of hypertension: chronic BP monitoring frequency: not checking BP medication side effects: no Duration of hyperlipidemia: chronic Cholesterol medication side effects: no Cholesterol supplements: none Past cholesterol medications: atorvastatin Medication compliance: good compliance Aspirin: no Recent stressors: no Recurrent headaches: no  Visual changes: no Palpitations: no Dyspnea: no Chest pain: no Lower extremity edema: no Dizzy/lightheaded: no  ANXIETY/STRESS Duration:stable Anxious mood: yes  Excessive worrying: yes Irritability: no  Sweating: no Nausea: no Palpitations:no Hyperventilation: no Panic attacks: no Agoraphobia: no  Obscessions/compulsions: no Depressed mood: yes Depression screen [facility] 2/9 06/01/2018 04/03/2018 02/27/2018 11/09/2017 10/12/2017  Decreased Interest 0 1 2 1 1   Down, Depressed, Hopeless 1 0 2 0 2  PHQ - 2 Score 1 1 4 1 3   Altered sleeping 1 1 1  0 1  Tired, decreased energy 1 2 2 1 1   Change in appetite 0 1 1 1 1   Feeling bad or failure about yourself  0 1 2 0 1  Trouble concentrating 1 1 2  0 1  Moving slowly or fidgety/restless 0 1 1 0 0  Suicidal thoughts 0 0 1 0 0  PHQ-9 Score 4 8 14 3 8   Difficult doing work/chores - - Very difficult Somewhat difficult Somewhat difficult  Some recent data might be hidden   GAD 7 : Generalized Anxiety Score 06/01/2018 04/03/2018 02/27/2018 11/09/2017  Nervous, Anxious,  on Edge 1 3 2 1   Control/stop worrying - 1 2 1   Worry too much - different things 1 1 2 1   Trouble relaxing 1 2 2 1   Restless 1 1 1  0  Easily annoyed or irritable 0 1 1 0  Afraid - awful might happen 1 1 1  0  Total GAD 7 Score - 10 11 4   Anxiety Difficulty - - Somewhat difficult Somewhat difficult   Anhedonia: no Weight changes: no Insomnia: no   Hypersomnia: no Fatigue/loss of energy: no Feelings of worthlessness: no Feelings of guilt: no Impaired concentration/indecisiveness: no Suicidal ideations: no  Crying spells: no Recent Stressors/Life Changes: no   Relationship problems: no   Family stress: no     Financial stress: no    Job stress: no    Recent death/loss: no  Back is still hurting. Epidural steroid injection didn't help. Would like to get back in to see Dr. Annette Stable again. Need refill on her tramadol.   Memory not good- feels like she has dementia. Has not seen neurology.   Back pain is no better. Was seeing neurosurgery in July.  They discussed epidural steroid injections and she was supposed to follow up with him. She has not.  Relevant past medical, surgical, family and social history reviewed and updated as indicated. Interim medical history since our last visit reviewed. Allergies and medications reviewed and updated.  Review of Systems  Constitutional: Positive for fatigue. Negative for activity change, appetite change, chills, diaphoresis, fever and unexpected weight change.  Respiratory: Negative.   Cardiovascular: Negative.   Musculoskeletal: Positive for arthralgias, back pain, gait problem and myalgias. Negative for joint swelling, neck pain and neck stiffness.  Skin: Negative.   Neurological: Positive for weakness and numbness. Negative for dizziness, tremors, seizures, syncope, facial asymmetry, speech difficulty, light-headedness and headaches.  Psychiatric/Behavioral: Positive for confusion, decreased concentration and dysphoric mood. Negative for  agitation, behavioral problems, hallucinations, self-injury, sleep disturbance and suicidal ideas. The patient is nervous/anxious. The patient is not hyperactive.     Per HPI unless specifically indicated above     Objective:    BP 124/84   Pulse 88   Temp 98.4 F (36.9 C) (Oral)   Ht 5' (1.524 m)   Wt 141 lb (64 kg)   LMP  (LMP Unknown)   SpO2 98%   BMI 27.54 kg/m   Wt Readings from Last 3 Encounters:  06/01/18 141 lb (64 kg)  04/03/18 144 lb (65.3 kg)  02/27/18 141 lb 8 oz (64.2 kg)    Physical Exam  Constitutional: She is oriented to person, place, and time. She appears well-developed and well-nourished. No distress.  HENT:  Head: Normocephalic and atraumatic.  Right Ear: Hearing normal.  Left Ear: Hearing normal.  Nose: Nose normal.  Eyes: Conjunctivae and lids are normal. Right eye exhibits no discharge. Left eye exhibits no discharge. No scleral icterus.  Cardiovascular: Normal rate, regular rhythm, normal heart sounds and intact distal pulses. Exam reveals no gallop and no friction rub.  No murmur heard. Pulmonary/Chest: Effort normal and breath sounds normal. No stridor. No respiratory distress. She has no wheezes. She has no rales. She exhibits no tenderness.  Musculoskeletal: Normal range of motion.  Neurological: She is alert and oriented to person, place, and time. Coordination abnormal.  Antalgic gait  Skin: Skin is warm, dry and intact. Capillary refill takes less than 2 seconds. No rash noted. She is not diaphoretic. No erythema. No pallor.  Well healing wound R arm  Psychiatric: She has a normal mood and affect. Her speech is normal and behavior is normal. Judgment and thought content normal. Cognition and memory are normal.  Nursing note and vitals reviewed.   Results for orders placed or performed during the hospital encounter of 12/18/17  Protime-INR  Result Value Ref Range   Prothrombin Time 12.1 11.4 - 15.2 seconds   INR 0.90   APTT  Result Value  Ref Range   aPTT 24 24 - 36 seconds  CBC  Result Value Ref Range   WBC 11.7 (H) 3.6 - 11.0 K/uL   RBC 4.51 3.80 - 5.20 MIL/uL   Hemoglobin 14.2 12.0 - 16.0 g/dL   HCT 42.5 35.0 - 47.0 %   MCV 94.3 80.0 - 100.0 fL   MCH 31.4 26.0 - 34.0 pg   MCHC 33.3 32.0 - 36.0 g/dL   RDW 12.9 11.5 - 14.5 %   Platelets 231 150 - 440 K/uL  Differential  Result Value Ref Range   Neutrophils Relative % 76 %   Neutro Abs 9.0 (H) 1.4 - 6.5 K/uL   Lymphocytes Relative 14 %   Lymphs Abs 1.6 1.0 - 3.6 K/uL   Monocytes Relative 7 %   Monocytes Absolute 0.9 0.2 - 0.9 K/uL   Eosinophils Relative 2 %   Eosinophils Absolute 0.2 0 - 0.7 K/uL   Basophils Relative 1 %   Basophils Absolute 0.1  0 - 0.1 K/uL  Comprehensive metabolic panel  Result Value Ref Range   Sodium 139 135 - 145 mmol/L   Potassium 3.3 (L) 3.5 - 5.1 mmol/L   Chloride 103 101 - 111 mmol/L   CO2 26 22 - 32 mmol/L   Glucose, Bld 141 (H) 65 - 99 mg/dL   BUN 11 6 - 20 mg/dL   Creatinine, Ser 0.70 0.44 - 1.00 mg/dL   Calcium 9.2 8.9 - 10.3 mg/dL   Total Protein 7.7 6.5 - 8.1 g/dL   Albumin 4.4 3.5 - 5.0 g/dL   AST 27 15 - 41 U/L   ALT 25 14 - 54 U/L   Alkaline Phosphatase 87 38 - 126 U/L   Total Bilirubin 0.4 0.3 - 1.2 mg/dL   GFR calc non Af Amer >60 >60 mL/min   GFR calc Af Amer >60 >60 mL/min   Anion gap 10 5 - 15  Troponin I  Result Value Ref Range   Troponin I <0.03 <0.03 ng/mL  Glucose, capillary  Result Value Ref Range   Glucose-Capillary 124 (H) 65 - 99 mg/dL  Hemoglobin A1c  Result Value Ref Range   Hgb A1c MFr Bld 5.5 4.8 - 5.6 %   Mean Plasma Glucose 111.15 mg/dL  Lipid panel  Result Value Ref Range   Cholesterol 170 0 - 200 mg/dL   Triglycerides 60 <150 mg/dL   HDL 60 >40 mg/dL   Total CHOL/HDL Ratio 2.8 RATIO   VLDL 12 0 - 40 mg/dL   LDL Cholesterol 98 0 - 99 mg/dL  ECHOCARDIOGRAM COMPLETE  Result Value Ref Range   Weight 2,336 oz   Height 59 in   BP 142/85 mmHg      Assessment & Plan:   Problem List  Items Addressed This Visit      Musculoskeletal and Integument   Degenerative joint disease (DJD) of lumbar spine    Refill of her tramadol given today. In significant pain. Will get her back into see neurosurgery.        Genitourinary   Benign hypertensive renal disease - Primary    Under good control on current regimen. Continue current regimen. Continue to monitor. Call with any concerns. Refills given.        Relevant Orders   CBC with Differential/Platelet   Comprehensive metabolic panel   Microalbumin, Urine Waived   TSH   UA/M w/rflx Culture, Routine   CKD (chronic kidney disease) stage 2, GFR 60-89 ml/min    Rechecking levels today. Await results.       Relevant Orders   CBC with Differential/Platelet   Comprehensive metabolic panel   Microalbumin, Urine Waived   TSH   UA/M w/rflx Culture, Routine     Other   Anxiety    Under good control on current regimen. Continue current regimen. Continue to monitor. Call with any concerns. Refills given.        Relevant Medications   DULoxetine (CYMBALTA) 60 MG capsule   Other Relevant Orders   CBC with Differential/Platelet   Comprehensive metabolic panel   TSH   UA/M w/rflx Culture, Routine   Depression    Under good control on current regimen. Continue current regimen. Continue to monitor. Call with any concerns. Refills given.        Relevant Medications   DULoxetine (CYMBALTA) 60 MG capsule   Other Relevant Orders   CBC with Differential/Platelet   Comprehensive metabolic panel   TSH   UA/M w/rflx Culture, Routine  Hyperlipidemia    Under good control on current regimen. Continue current regimen. Continue to monitor. Call with any concerns. Refills given.        Relevant Orders   CBC with Differential/Platelet   Comprehensive metabolic panel   Lipid Panel w/o Chol/HDL Ratio   TSH   UA/M w/rflx Culture, Routine   Vitamin D deficiency disease    Rechecking levels today. Await results. Call with any  concerns.       Relevant Orders   CBC with Differential/Platelet   Comprehensive metabolic panel   TSH   UA/M w/rflx Culture, Routine   VITAMIN D 25 Hydroxy (Vit-D Deficiency, Fractures)   Memory impairment    Not doing well. Needs to see neurology. Does not want to go to [facility]- new referral to [facility] made today.      Relevant Orders   CBC with Differential/Platelet   Comprehensive metabolic panel   TSH   UA/M w/rflx Culture, Routine    Other Visit Diagnoses    Flu vaccine need       Flu shot given today.   Relevant Orders   Flu vaccine HIGH DOSE PF (Completed)   CBC with Differential/Platelet   Comprehensive metabolic panel   TSH   UA/M w/rflx Culture, Routine   Open wound of lower arm, right, initial encounter       Healing well. Due for Td. Ordered today.   Relevant Orders   Td : Tetanus/diphtheria >7yo Preservative  free (Completed)       Follow up plan: Return in about 2 months (around 08/01/2018).

## 2018-06-01 NOTE — Assessment & Plan Note (Signed)
Rechecking levels today. Await results.  

## 2018-06-01 NOTE — Assessment & Plan Note (Signed)
Not doing well. Needs to see neurology. Does not want to go to [facility]- new referral to [facility] made today.

## 2018-06-01 NOTE — Assessment & Plan Note (Signed)
Rechecking levels today. Await results. Call with any concerns.  

## 2018-06-01 NOTE — Assessment & Plan Note (Signed)
Refill of her tramadol given today. In significant pain. Will get her back into see neurosurgery.

## 2018-06-02 LAB — TSH: TSH: 1.5 u[IU]/mL (ref 0.450–4.500)

## 2018-06-02 LAB — COMPREHENSIVE METABOLIC PANEL
ALBUMIN: 4.2 g/dL (ref 3.5–4.8)
ALK PHOS: 83 IU/L (ref 39–117)
ALT: 20 IU/L (ref 0–32)
AST: 13 IU/L (ref 0–40)
Albumin/Globulin Ratio: 1.7 (ref 1.2–2.2)
BUN/Creatinine Ratio: 15 (ref 12–28)
BUN: 12 mg/dL (ref 8–27)
Bilirubin Total: <0.2 mg/dL (ref 0.0–1.2)
CALCIUM: 9.5 mg/dL (ref 8.7–10.3)
CO2: 24 mmol/L (ref 20–29)
CREATININE: 0.81 mg/dL (ref 0.57–1.00)
Chloride: 102 mmol/L (ref 96–106)
GFR, EST AFRICAN AMERICAN: 82 mL/min/{1.73_m2} (ref >59)
GFR, EST NON AFRICAN AMERICAN: 71 mL/min/{1.73_m2} (ref >59)
GLOBULIN, TOTAL: 2.5 g/dL (ref 1.5–4.5)
GLUCOSE: 102 mg/dL — AB (ref 65–99)
Potassium: 4.1 mmol/L (ref 3.5–5.2)
SODIUM: 142 mmol/L (ref 134–144)
Total Protein: 6.7 g/dL (ref 6.0–8.5)

## 2018-06-02 LAB — CBC WITH DIFFERENTIAL/PLATELET
BASOS ABS: 0 10*3/uL (ref 0.0–0.2)
Basos: 0 %
EOS (ABSOLUTE): 0.1 10*3/uL (ref 0.0–0.4)
EOS: 1 %
Hematocrit: 38.9 % (ref 34.0–46.6)
Hemoglobin: 13.2 g/dL (ref 11.1–15.9)
IMMATURE GRANULOCYTES: 0 %
Immature Grans (Abs): 0 10*3/uL (ref 0.0–0.1)
LYMPHS: 25 %
Lymphocytes Absolute: 1.8 10*3/uL (ref 0.7–3.1)
MCH: 32.4 pg (ref 26.6–33.0)
MCHC: 33.9 g/dL (ref 31.5–35.7)
MCV: 95 fL (ref 79–97)
Monocytes Absolute: 0.7 10*3/uL (ref 0.1–0.9)
Monocytes: 10 %
NEUTROS PCT: 64 %
Neutrophils Absolute: 4.5 10*3/uL (ref 1.4–7.0)
Platelets: 249 10*3/uL (ref 150–450)
RBC: 4.08 x10E6/uL (ref 3.77–5.28)
RDW: 12.1 % — ABNORMAL LOW (ref 12.3–15.4)
WBC: 7.2 10*3/uL (ref 3.4–10.8)

## 2018-06-02 LAB — VITAMIN D 25 HYDROXY (VIT D DEFICIENCY, FRACTURES): Vit D, 25-Hydroxy: 52.5 ng/mL (ref 30.0–100.0)

## 2018-06-02 LAB — LIPID PANEL W/O CHOL/HDL RATIO
Cholesterol, Total: 178 mg/dL (ref 100–199)
HDL: 72 mg/dL (ref >39)
LDL CALC: 81 mg/dL (ref 0–99)
TRIGLYCERIDES: 123 mg/dL (ref 0–149)
VLDL Cholesterol Cal: 25 mg/dL (ref 5–40)

## 2018-06-04 ENCOUNTER — Encounter: Payer: Self-pay | Admitting: Family Medicine

## 2018-06-05 DIAGNOSIS — Z23 Encounter for immunization: Secondary | ICD-10-CM | POA: Diagnosis not present

## 2018-06-05 DIAGNOSIS — I129 Hypertensive chronic kidney disease with stage 1 through stage 4 chronic kidney disease, or unspecified chronic kidney disease: Secondary | ICD-10-CM | POA: Diagnosis not present

## 2018-06-05 DIAGNOSIS — F331 Major depressive disorder, recurrent, moderate: Secondary | ICD-10-CM | POA: Diagnosis not present

## 2018-06-05 DIAGNOSIS — E782 Mixed hyperlipidemia: Secondary | ICD-10-CM | POA: Diagnosis not present

## 2018-06-05 DIAGNOSIS — N182 Chronic kidney disease, stage 2 (mild): Secondary | ICD-10-CM | POA: Diagnosis not present

## 2018-06-05 DIAGNOSIS — R413 Other amnesia: Secondary | ICD-10-CM | POA: Diagnosis not present

## 2018-06-05 DIAGNOSIS — F419 Anxiety disorder, unspecified: Secondary | ICD-10-CM | POA: Diagnosis not present

## 2018-06-05 DIAGNOSIS — E559 Vitamin D deficiency, unspecified: Secondary | ICD-10-CM | POA: Diagnosis not present

## 2018-06-05 LAB — UA/M W/RFLX CULTURE, ROUTINE
Bilirubin, UA: NEGATIVE
Glucose, UA: NEGATIVE
Ketones, UA: NEGATIVE
Leukocytes, UA: NEGATIVE
NITRITE UA: NEGATIVE
Protein, UA: NEGATIVE
RBC, UA: NEGATIVE
Specific Gravity, UA: 1.02 (ref 1.005–1.030)
UUROB: 0.2 mg/dL (ref 0.2–1.0)
pH, UA: 6.5 (ref 5.0–7.5)

## 2018-06-05 LAB — MICROALBUMIN, URINE WAIVED
Creatinine, Urine Waived: 100 mg/dL (ref 10–300)
Microalb, Ur Waived: 30 mg/L — ABNORMAL HIGH (ref 0–19)
Microalb/Creat Ratio: <30 mg/g (ref <30)

## 2018-06-10 ENCOUNTER — Other Ambulatory Visit: Payer: Self-pay | Admitting: Family Medicine

## 2018-06-11 NOTE — Telephone Encounter (Signed)
Requested medication (s) are due for refill today:   Yes  Requested medication (s) are on the active medication list:   Yes  Future visit scheduled:   Yes 08/02/18  With Wynetta Emery   Last ordered: 03/13/18  #12  0 refills   Requested Prescriptions  Pending Prescriptions Disp Refills   Vitamin D, Ergocalciferol, (DRISDOL) 50000 units CAPS capsule [Pharmacy Med Name: VITAMIN D2 50,000 IU CAP] 12 capsule 0    Sig: Take 1 capsule (50,000 Units total) by mouth every 7 (seven) days.     Endocrinology:  Vitamins - Vitamin D Supplementation Failed - 06/10/2018  7:07 AM      Failed - 50,000 IU strengths are not delegated      Failed - Phosphate in normal range and within 360 days    No results found for: PHOS       Passed - Ca in normal range and within 360 days    Calcium  Date Value Ref Range Status  06/01/2018 9.5 8.7 - 10.3 mg/dL Final   Calcium, Total  Date Value Ref Range Status  06/08/2014 8.2 (L) 8.5 - 10.1 mg/dL Final         Passed - Vit D in normal range and within 360 days    Vit D, 25-Hydroxy  Date Value Ref Range Status  06/01/2018 52.5 30.0 - 100.0 ng/mL Final    Comment:    Vitamin D deficiency has been defined by the [place] practice guideline as a level of serum 25-OH vitamin D less than 20 ng/mL (1,2). The Endocrine Society went on to further define vitamin D insufficiency as a level between 21 and 29 ng/mL (2). 1. IOM (Institute of Medicine). 2010. Dietary reference    intakes for calcium and D. [place]: The    [employer]. 2. Holick MF, Binkley Roy, Bischoff-Ferrari HA, et al.    Evaluation, treatment, and prevention of vitamin D    deficiency: an Endocrine Society clinical practice    guideline. JCEM. 2011 Jul; 96(7):1911-30.          Passed - Valid encounter within last 12 months    Recent Outpatient Visits          1 week ago Benign hypertensive renal disease   [place],  [place], DO   2 months ago [place] Carles Collet M, PA-C   3 months ago Osteoarthritis of spine with radiculopathy, lumbar region   [facility], Megan P, DO   4 months ago Spinal stenosis of lumbar region, unspecified whether neurogenic claudication present   [facility], Megan P, DO   5 months ago TIA (transient ischemic attack)   [place], Barb Merino, DO      Future Appointments            In 1 month [place], Megan P, DO [place], [facility]         Signed Prescriptions Disp Refills   omeprazole (PRILOSEC) 20 MG capsule 90 capsule 0    Sig: Take 1 capsule (20 mg total) by mouth daily.     Gastroenterology: Proton Pump Inhibitors Passed - 06/10/2018  7:07 AM      Passed - Valid encounter within last 12 months    Recent Outpatient Visits          1 week ago Benign hypertensive renal disease   [place],  Megan P, DO   2 months ago [place], PA-C   3 months ago Osteoarthritis of spine with radiculopathy, lumbar region   [facility], [place], DO   4 months ago Spinal stenosis of lumbar region, unspecified whether neurogenic claudication present   [facility], Megan P, DO   5 months ago TIA (transient ischemic attack)   [facility] [name], DO      Future Appointments            In 1 month Johnson, Barb Merino, DO [place], [facility]

## 2018-06-20 DIAGNOSIS — L820 Inflamed seborrheic keratosis: Secondary | ICD-10-CM | POA: Diagnosis not present

## 2018-06-20 DIAGNOSIS — L821 Other seborrheic keratosis: Secondary | ICD-10-CM | POA: Diagnosis not present

## 2018-07-11 DIAGNOSIS — R413 Other amnesia: Secondary | ICD-10-CM | POA: Diagnosis not present

## 2018-08-02 ENCOUNTER — Ambulatory Visit: Payer: Medicare Other | Admitting: Family Medicine

## 2018-08-11 ENCOUNTER — Other Ambulatory Visit: Payer: Self-pay | Admitting: Family Medicine

## 2018-08-23 ENCOUNTER — Ambulatory Visit: Payer: Medicare Other | Admitting: Family Medicine

## 2018-08-27 ENCOUNTER — Other Ambulatory Visit: Payer: Self-pay | Admitting: Family Medicine

## 2018-08-31 ENCOUNTER — Ambulatory Visit (INDEPENDENT_AMBULATORY_CARE_PROVIDER_SITE_OTHER): Payer: Medicare Other | Admitting: Family Medicine

## 2018-08-31 ENCOUNTER — Encounter: Payer: Self-pay | Admitting: Family Medicine

## 2018-08-31 ENCOUNTER — Other Ambulatory Visit: Payer: Self-pay

## 2018-08-31 VITALS — BP 123/82 | HR 89 | Temp 98.4°F | Ht 60.0 in | Wt 134.0 lb

## 2018-08-31 DIAGNOSIS — F419 Anxiety disorder, unspecified: Secondary | ICD-10-CM

## 2018-08-31 DIAGNOSIS — E782 Mixed hyperlipidemia: Secondary | ICD-10-CM

## 2018-08-31 DIAGNOSIS — E559 Vitamin D deficiency, unspecified: Secondary | ICD-10-CM

## 2018-08-31 DIAGNOSIS — M25561 Pain in right knee: Secondary | ICD-10-CM

## 2018-08-31 DIAGNOSIS — N182 Chronic kidney disease, stage 2 (mild): Secondary | ICD-10-CM | POA: Diagnosis not present

## 2018-08-31 DIAGNOSIS — I129 Hypertensive chronic kidney disease with stage 1 through stage 4 chronic kidney disease, or unspecified chronic kidney disease: Secondary | ICD-10-CM

## 2018-08-31 DIAGNOSIS — F331 Major depressive disorder, recurrent, moderate: Secondary | ICD-10-CM

## 2018-08-31 DIAGNOSIS — G8929 Other chronic pain: Secondary | ICD-10-CM

## 2018-08-31 MED ORDER — OMEPRAZOLE 20 MG PO CPDR: 20.0000 mg | DELAYED_RELEASE_CAPSULE | Freq: Every day | ORAL | 0 refills | Status: DC

## 2018-08-31 MED ORDER — DICLOFENAC SODIUM 1 % TD GEL: 4.0000 g | Freq: Four times a day (QID) | TRANSDERMAL | 3 refills | Status: DC

## 2018-08-31 MED ORDER — FLUTICASONE PROPIONATE 50 MCG/ACT NA SUSP: 2.0000 | Freq: Every day | NASAL | 3 refills | Status: DC

## 2018-08-31 MED ORDER — ATORVASTATIN CALCIUM 40 MG PO TABS: 40.0000 mg | ORAL_TABLET | Freq: Every day | ORAL | 3 refills | Status: DC

## 2018-08-31 MED ORDER — LOSARTAN POTASSIUM 50 MG PO TABS: 50.0000 mg | ORAL_TABLET | Freq: Every day | ORAL | 3 refills | Status: DC

## 2018-08-31 MED ORDER — DULOXETINE HCL 60 MG PO CPEP: 60.0000 mg | ORAL_CAPSULE | Freq: Every day | ORAL | 1 refills | Status: DC

## 2018-08-31 MED ORDER — ARIPIPRAZOLE 5 MG PO TABS: 5.0000 mg | ORAL_TABLET | Freq: Every day | ORAL | 3 refills | Status: DC

## 2018-08-31 MED ORDER — RANITIDINE HCL 150 MG PO TABS: 300.0000 mg | ORAL_TABLET | Freq: Every evening | ORAL | 1 refills | Status: DC

## 2018-08-31 MED ORDER — TRAMADOL HCL 50 MG PO TABS: 50.0000 mg | ORAL_TABLET | Freq: Two times a day (BID) | ORAL | 1 refills | Status: DC | PRN

## 2018-08-31 MED ORDER — AMLODIPINE BESYLATE 10 MG PO TABS: 10.0000 mg | ORAL_TABLET | Freq: Every day | ORAL | 3 refills | Status: DC

## 2018-08-31 MED ORDER — LORATADINE 10 MG PO TABS: 10.0000 mg | ORAL_TABLET | Freq: Every day | ORAL | 3 refills | Status: DC

## 2018-08-31 MED ORDER — GABAPENTIN 100 MG PO CAPS: 100.0000 mg | ORAL_CAPSULE | Freq: Three times a day (TID) | ORAL | 2 refills | Status: DC

## 2018-08-31 NOTE — Patient Instructions (Signed)
Hulan Amato, NP (Attending) DM: [phone] (Work) [phone] (Fax) [place], [place] [address] Neurology

## 2018-08-31 NOTE — Progress Notes (Signed)
BP 123/82   Pulse 89   Temp 98.4 F (36.9 C) (Oral)   Ht 5' (1.524 m)   Wt 134 lb (60.8 kg)   LMP  (LMP Unknown)   SpO2 96%   BMI 26.17 kg/m    Subjective:    Patient ID: Cynthia Vaughan, female    DOB: [birth year], 78 y.o.   MRN: [id number]  HPI: Cynthia Vaughan is a 78 y.o. female  Chief Complaint  Patient presents with  . Hypertension    f/u  . Hyperlipidemia  . Depression  . Anxiety   HYPERTENSION / HYPERLIPIDEMIA Satisfied with current treatment? yes Duration of hypertension: chronic BP monitoring frequency: not checking BP medication side effects: no Past BP meds: amlodipine, losartan Duration of hyperlipidemia: chronic Cholesterol medication side effects: no Cholesterol supplements: none Past cholesterol medications: atorvastatin Medication compliance: good compliance Aspirin: yes Recent stressors: no Recurrent headaches: no Visual changes: no Palpitations: no Dyspnea: no Chest pain: no Lower extremity edema: no Dizzy/lightheaded: no  ANXIETY/DEPRESSION Duration:stable Anxious mood: yes  Excessive worrying: yes Irritability: no  Sweating: no Nausea: no Palpitations:no Hyperventilation: no Panic attacks: no Agoraphobia: no  Obscessions/compulsions: no Depressed mood: no Depression screen [facility] 2/9 08/31/2018 06/01/2018 04/03/2018 02/27/2018 11/09/2017  Decreased Interest 1 0 1 2 1   Down, Depressed, Hopeless 0 1 0 2 0  PHQ - 2 Score 1 1 1 4 1   Altered sleeping 0 1 1 1  0  Tired, decreased energy 1 1 2 2 1   Change in appetite 1 0 1 1 1   Feeling bad or failure about yourself  0 0 1 2 0  Trouble concentrating 0 1 1 2  0  Moving slowly or fidgety/restless 0 0 1 1 0  Suicidal thoughts 0 0 0 1 0  PHQ-9 Score 3 4 8 14 3   Difficult doing work/chores Somewhat difficult - - Very difficult Somewhat difficult  Some recent data might be hidden   Anhedonia: no Weight changes: no Insomnia: no   Hypersomnia: no Fatigue/loss of energy: yes Feelings of  worthlessness: no Feelings of guilt: no Impaired concentration/indecisiveness: no Suicidal ideations: no  Crying spells: no Recent Stressors/Life Changes: no   Relationship problems: no   Family stress: no     Financial stress: no    Job stress: no    Recent death/loss: no  Continues with pain in her knee. Has been using tramadol, but has been out of it. Has not been using anything else. Otherwise feeling well with no other concerns or complaints at this time.  Relevant past medical, surgical, family and social history reviewed and updated as indicated. Interim medical history since our last visit reviewed. Allergies and medications reviewed and updated.  Review of Systems  Constitutional: Negative.   Respiratory: Negative.   Cardiovascular: Negative.   Musculoskeletal: Positive for arthralgias. Negative for back pain, gait problem, joint swelling, myalgias, neck pain and neck stiffness.  Skin: Negative.   Psychiatric/Behavioral: Positive for confusion. Negative for agitation, behavioral problems, decreased concentration, dysphoric mood, hallucinations, self-injury, sleep disturbance and suicidal ideas. The patient is not nervous/anxious and is not hyperactive.     Per HPI unless specifically indicated above     Objective:    BP 123/82   Pulse 89   Temp 98.4 F (36.9 C) (Oral)   Ht 5' (1.524 m)   Wt 134 lb (60.8 kg)   LMP  (LMP Unknown)   SpO2 96%   BMI 26.17 kg/m   Wt Readings from Last 3  Encounters:  08/31/18 134 lb (60.8 kg)  06/01/18 141 lb (64 kg)  04/03/18 144 lb (65.3 kg)    Physical Exam Vitals signs and nursing note reviewed.  Constitutional:      General: She is not in acute distress.    Appearance: Normal appearance. She is not ill-appearing, toxic-appearing or diaphoretic.  HENT:     Head: Normocephalic and atraumatic.     Right Ear: External ear normal.     Left Ear: External ear normal.     Nose: Nose normal.     Mouth/Throat:     Mouth: Mucous  membranes are moist.     Pharynx: Oropharynx is clear.  Eyes:     General: No scleral icterus.       Right eye: No discharge.        Left eye: No discharge.     Extraocular Movements: Extraocular movements intact.     Conjunctiva/sclera: Conjunctivae normal.     Pupils: Pupils are equal, round, and reactive to light.  Neck:     Musculoskeletal: Normal range of motion and neck supple.  Cardiovascular:     Rate and Rhythm: Normal rate and regular rhythm.     Pulses: Normal pulses.     Heart sounds: Normal heart sounds. No murmur. No friction rub. No gallop.   Pulmonary:     Effort: Pulmonary effort is normal. No respiratory distress.     Breath sounds: Normal breath sounds. No stridor. No wheezing, rhonchi or rales.  Chest:     Chest wall: No tenderness.  Musculoskeletal: Normal range of motion.  Skin:    General: Skin is warm and dry.     Capillary Refill: Capillary refill takes less than 2 seconds.     Coloration: Skin is not jaundiced or pale.     Findings: No bruising, erythema, lesion or rash.  Neurological:     General: No focal deficit present.     Mental Status: She is alert and oriented to person, place, and time. Mental status is at baseline.  Psychiatric:        Mood and Affect: Mood normal.        Behavior: Behavior normal.        Thought Content: Thought content normal.        Judgment: Judgment normal.     Results for orders placed or performed in visit on 08/31/18  Comprehensive metabolic panel  Result Value Ref Range   Glucose 128 (H) 65 - 99 mg/dL   BUN 11 8 - 27 mg/dL   Creatinine, Ser 0.81 0.57 - 1.00 mg/dL   GFR calc non Af Amer 71 >59 mL/min/1.73   GFR calc Af Amer 82 >59 mL/min/1.73   BUN/Creatinine Ratio 14 12 - 28   Sodium 142 134 - 144 mmol/L   Potassium 3.4 (L) 3.5 - 5.2 mmol/L   Chloride 100 96 - 106 mmol/L   CO2 22 20 - 29 mmol/L   Calcium 9.6 8.7 - 10.3 mg/dL   Total Protein 6.7 6.0 - 8.5 g/dL   Albumin 4.2 3.5 - 4.8 g/dL   Globulin,  Total 2.5 1.5 - 4.5 g/dL   Albumin/Globulin Ratio 1.7 1.2 - 2.2   Bilirubin Total 0.3 0.0 - 1.2 mg/dL   Alkaline Phosphatase 86 39 - 117 IU/L   AST 14 0 - 40 IU/L   ALT 14 0 - 32 IU/L  Lipid Panel w/o Chol/HDL Ratio  Result Value Ref Range   Cholesterol, Total 180 100 -  199 mg/dL   Triglycerides 90 0 - 149 mg/dL   HDL 73 >39 mg/dL   VLDL Cholesterol Cal 18 5 - 40 mg/dL   LDL Calculated 89 0 - 99 mg/dL  VITAMIN D 25 Hydroxy (Vit-D Deficiency, Fractures)  Result Value Ref Range   Vit D, 25-Hydroxy 44.6 30.0 - 100.0 ng/mL  CBC with Differential/Platelet  Result Value Ref Range   WBC 7.6 3.4 - 10.8 x10E3/uL   RBC 4.52 3.77 - 5.28 x10E6/uL   Hemoglobin 14.0 11.1 - 15.9 g/dL   Hematocrit 41.7 34.0 - 46.6 %   MCV 92 79 - 97 fL   MCH 31.0 26.6 - 33.0 pg   MCHC 33.6 31.5 - 35.7 g/dL   RDW 11.8 11.7 - 15.4 %   Platelets 210 150 - 450 x10E3/uL   Neutrophils 69 Not Estab. %   Lymphs 23 Not Estab. %   Monocytes 7 Not Estab. %   Eos 1 Not Estab. %   Basos 0 Not Estab. %   Neutrophils Absolute 5.2 1.4 - 7.0 x10E3/uL   Lymphocytes Absolute 1.7 0.7 - 3.1 x10E3/uL   Monocytes Absolute 0.5 0.1 - 0.9 x10E3/uL   EOS (ABSOLUTE) 0.1 0.0 - 0.4 x10E3/uL   Basophils Absolute 0.0 0.0 - 0.2 x10E3/uL   Immature Granulocytes 0 Not Estab. %   Immature Grans (Abs) 0.0 0.0 - 0.1 x10E3/uL  TSH  Result Value Ref Range   TSH 2.050 0.450 - 4.500 uIU/mL      Assessment & Plan:   Problem List Items Addressed This Visit      Genitourinary   Benign hypertensive renal disease    Under good control on current regimen. Continue current regimen. Continue to monitor. Call with any concerns. Refills given. Labs checked today.       Relevant Orders   CBC with Differential/Platelet   Comprehensive metabolic panel (Completed)   Microalbumin, Urine Waived   TSH   UA/M w/rflx Culture, Routine   CKD (chronic kidney disease) stage 2, GFR 60-89 ml/min    Rechecking levels today. Will continue to monitor. Call  with any concerns.       Relevant Orders   CBC with Differential/Platelet   Comprehensive metabolic panel (Completed)   TSH   UA/M w/rflx Culture, Routine     Other   Anxiety    Under good control on current regimen. Continue current regimen. Continue to monitor. Call with any concerns. Refills given. Labs drawn today.       Relevant Medications   DULoxetine (CYMBALTA) 60 MG capsule   Other Relevant Orders   CBC with Differential/Platelet   Comprehensive metabolic panel (Completed)   TSH   UA/M w/rflx Culture, Routine   Depression    Under good control on current regimen. Continue current regimen. Continue to monitor. Call with any concerns. Refills given. Labs drawn today.       Relevant Medications   DULoxetine (CYMBALTA) 60 MG capsule   Other Relevant Orders   CBC with Differential/Platelet   Comprehensive metabolic panel (Completed)   TSH   UA/M w/rflx Culture, Routine   Hyperlipidemia - Primary    Under good control on current regimen. Continue current regimen. Continue to monitor. Call with any concerns. Refills given. Labs drawn today.       Relevant Medications   amLODipine (NORVASC) 10 MG tablet   atorvastatin (LIPITOR) 40 MG tablet   losartan (COZAAR) 50 MG tablet   Other Relevant Orders   CBC with Differential/Platelet  Comprehensive metabolic panel (Completed)   Lipid Panel w/o Chol/HDL Ratio (Completed)   TSH   UA/M w/rflx Culture, Routine   Vitamin D deficiency disease    Rechecking levels today. Will continue to monitor. Call with any concerns.       Relevant Orders   CBC with Differential/Platelet   Comprehensive metabolic panel (Completed)   TSH   UA/M w/rflx Culture, Routine   VITAMIN D 25 Hydroxy (Vit-D Deficiency, Fractures) (Completed)    Other Visit Diagnoses    Chronic pain of right knee       Will start voltaren gel to be used daily. Continue tramadol PRN. Rx should last 3 months. Call with any concerns.        Follow up  plan: Return in about 3 months (around 11/30/2018) for FOllow up.

## 2018-09-01 LAB — CBC WITH DIFFERENTIAL/PLATELET
Basophils Absolute: 0 10*3/uL (ref 0.0–0.2)
Basos: 0 %
EOS (ABSOLUTE): 0.1 10*3/uL (ref 0.0–0.4)
Eos: 1 %
HEMATOCRIT: 41.7 % (ref 34.0–46.6)
Hemoglobin: 14 g/dL (ref 11.1–15.9)
Immature Grans (Abs): 0 10*3/uL (ref 0.0–0.1)
Immature Granulocytes: 0 %
Lymphocytes Absolute: 1.7 10*3/uL (ref 0.7–3.1)
Lymphs: 23 %
MCH: 31 pg (ref 26.6–33.0)
MCHC: 33.6 g/dL (ref 31.5–35.7)
MCV: 92 fL (ref 79–97)
Monocytes Absolute: 0.5 10*3/uL (ref 0.1–0.9)
Monocytes: 7 %
Neutrophils Absolute: 5.2 10*3/uL (ref 1.4–7.0)
Neutrophils: 69 %
Platelets: 210 10*3/uL (ref 150–450)
RBC: 4.52 x10E6/uL (ref 3.77–5.28)
RDW: 11.8 % (ref 11.7–15.4)
WBC: 7.6 10*3/uL (ref 3.4–10.8)

## 2018-09-01 LAB — COMPREHENSIVE METABOLIC PANEL
ALBUMIN: 4.2 g/dL (ref 3.5–4.8)
ALT: 14 IU/L (ref 0–32)
AST: 14 IU/L (ref 0–40)
Albumin/Globulin Ratio: 1.7 (ref 1.2–2.2)
Alkaline Phosphatase: 86 IU/L (ref 39–117)
BUN/Creatinine Ratio: 14 (ref 12–28)
BUN: 11 mg/dL (ref 8–27)
Bilirubin Total: 0.3 mg/dL (ref 0.0–1.2)
CO2: 22 mmol/L (ref 20–29)
Calcium: 9.6 mg/dL (ref 8.7–10.3)
Chloride: 100 mmol/L (ref 96–106)
Creatinine, Ser: 0.81 mg/dL (ref 0.57–1.00)
GFR calc Af Amer: 82 mL/min/{1.73_m2} (ref >59)
GFR calc non Af Amer: 71 mL/min/{1.73_m2} (ref >59)
GLOBULIN, TOTAL: 2.5 g/dL (ref 1.5–4.5)
Glucose: 128 mg/dL — ABNORMAL HIGH (ref 65–99)
Potassium: 3.4 mmol/L — ABNORMAL LOW (ref 3.5–5.2)
Sodium: 142 mmol/L (ref 134–144)
Total Protein: 6.7 g/dL (ref 6.0–8.5)

## 2018-09-01 LAB — LIPID PANEL W/O CHOL/HDL RATIO
Cholesterol, Total: 180 mg/dL (ref 100–199)
HDL: 73 mg/dL (ref >39)
LDL Calculated: 89 mg/dL (ref 0–99)
Triglycerides: 90 mg/dL (ref 0–149)
VLDL Cholesterol Cal: 18 mg/dL (ref 5–40)

## 2018-09-01 LAB — VITAMIN D 25 HYDROXY (VIT D DEFICIENCY, FRACTURES): Vit D, 25-Hydroxy: 44.6 ng/mL (ref 30.0–100.0)

## 2018-09-01 LAB — TSH: TSH: 2.05 u[IU]/mL (ref 0.450–4.500)

## 2018-09-02 ENCOUNTER — Encounter: Payer: Self-pay | Admitting: Family Medicine

## 2018-09-02 NOTE — Assessment & Plan Note (Signed)
Under good control on current regimen. Continue current regimen. Continue to monitor. Call with any concerns. Refills given. Labs drawn today.   

## 2018-09-02 NOTE — Assessment & Plan Note (Signed)
Rechecking levels today. Will continue to monitor. Call with any concerns.

## 2018-09-02 NOTE — Assessment & Plan Note (Signed)
Under good control on current regimen. Continue current regimen. Continue to monitor. Call with any concerns. Refills given. Labs checked today.  

## 2018-11-09 ENCOUNTER — Other Ambulatory Visit: Payer: Self-pay | Admitting: Family Medicine

## 2018-12-04 ENCOUNTER — Other Ambulatory Visit: Payer: Self-pay

## 2018-12-04 ENCOUNTER — Encounter: Payer: Self-pay | Admitting: Family Medicine

## 2018-12-04 ENCOUNTER — Ambulatory Visit (INDEPENDENT_AMBULATORY_CARE_PROVIDER_SITE_OTHER): Payer: Medicare Other | Admitting: Family Medicine

## 2018-12-04 VITALS — BP 110/76 | HR 80 | Temp 97.3°F | Ht 60.0 in

## 2018-12-04 DIAGNOSIS — R413 Other amnesia: Secondary | ICD-10-CM

## 2018-12-04 DIAGNOSIS — F331 Major depressive disorder, recurrent, moderate: Secondary | ICD-10-CM

## 2018-12-04 DIAGNOSIS — I129 Hypertensive chronic kidney disease with stage 1 through stage 4 chronic kidney disease, or unspecified chronic kidney disease: Secondary | ICD-10-CM | POA: Diagnosis not present

## 2018-12-04 DIAGNOSIS — F419 Anxiety disorder, unspecified: Secondary | ICD-10-CM | POA: Diagnosis not present

## 2018-12-04 MED ORDER — OMEPRAZOLE 20 MG PO CPDR: 20.0000 mg | DELAYED_RELEASE_CAPSULE | Freq: Every day | ORAL | 0 refills | Status: DC

## 2018-12-04 MED ORDER — DONEPEZIL HCL 10 MG PO TABS: 10.0000 mg | ORAL_TABLET | Freq: Every day | ORAL | 1 refills | Status: DC

## 2018-12-04 NOTE — Assessment & Plan Note (Signed)
Under good control on current regimen. Continue current regimen. Continue to monitor. Call with any concerns.   

## 2018-12-04 NOTE — Progress Notes (Signed)
BP 110/76   Pulse 80   Temp (!) 97.3 F (36.3 C) (Oral)   Ht 5' (1.524 m)   LMP  (LMP Unknown)   SpO2 97%   BMI 26.17 kg/m    Subjective:    Patient ID: Cynthia Vaughan, female    DOB: October 17, 1940, 78 y.o.   MRN: [id number]  HPI: Cynthia Vaughan is a 78 y.o. female  Chief Complaint  Patient presents with  . Depression  . Memory Loss   ANXIETY/DEPRESSION Duration:exacerbated Anxious mood: yes  Excessive worrying: yes Irritability: yes  Sweating: no Nausea: no Palpitations:no Hyperventilation: no Panic attacks: no Agoraphobia: no  Obscessions/compulsions: no Depressed mood: yes Anhedonia: no Weight changes: yes Insomnia: no   Hypersomnia: no Fatigue/loss of energy: yes Feelings of worthlessness: no Feelings of guilt: no Impaired concentration/indecisiveness: no Suicidal ideations: no  Crying spells: no Recent Stressors/Life Changes: yes   Relationship problems: no   Family stress: yes     Financial stress: yes    Job stress: no    Recent death/loss: no  Depression screen [facility] 2/9 12/04/2018 08/31/2018 06/01/2018 04/03/2018 02/27/2018  Decreased Interest 0 1 0 1 2  Down, Depressed, Hopeless 1 0 1 0 2  PHQ - 2 Score 1 1 1 1 4   Altered sleeping 0 0 1 1 1   Tired, decreased energy 2 1 1 2 2   Change in appetite 1 1 0 1 1  Feeling bad or failure about yourself  1 0 0 1 2  Trouble concentrating 2 0 1 1 2   Moving slowly or fidgety/restless 0 0 0 1 1  Suicidal thoughts 0 0 0 0 1  PHQ-9 Score 7 3 4 8 14   Difficult doing work/chores Somewhat difficult Somewhat difficult - - Very difficult  Some recent data might be hidden   GAD 7 : Generalized Anxiety Score 08/31/2018 06/01/2018 04/03/2018 02/27/2018  Nervous, Anxious, on Edge 2 1 3 2   Control/stop worrying 1 - 1 2  Worry too much - different things 0 1 1 2   Trouble relaxing 0 1 2 2   Restless 0 1 1 1   Easily annoyed or irritable 0 0 1 1  Afraid - awful might happen 0 1 1 1   Total GAD 7 Score 3 - 10 11  Anxiety  Difficulty Not difficult at all - - Somewhat difficult   MMSE - Mini Mental State Exam 12/04/2018 12/18/2017  Orientation to time 3 3  Orientation to Place 5 4  Registration 3 3  Attention/ Calculation 2 5  Recall 0 0  Language- name 2 objects 2 2  Language- repeat 1 1  Language- follow 3 step command 3 3  Language- read & follow direction 1 1  Write a sentence 1 1  Copy design 1 1  Total score 22 24   Relevant past medical, surgical, family and social history reviewed and updated as indicated. Interim medical history since our last visit reviewed. Allergies and medications reviewed and updated.  Review of Systems  Constitutional: Negative.   Respiratory: Negative.   Cardiovascular: Negative.   Musculoskeletal: Negative.   Skin: Negative.   Neurological: Negative.   Psychiatric/Behavioral: Positive for dysphoric mood. Negative for agitation, behavioral problems, confusion, decreased concentration, hallucinations, self-injury, sleep disturbance and suicidal ideas. The patient is nervous/anxious. The patient is not hyperactive.     Per HPI unless specifically indicated above     Objective:    BP 110/76   Pulse 80   Temp (!) 97.3 F (  36.3 C) (Oral)   Ht 5' (1.524 m)   LMP  (LMP Unknown)   SpO2 97%   BMI 26.17 kg/m   Wt Readings from Last 3 Encounters:  08/31/18 134 lb (60.8 kg)  06/01/18 141 lb (64 kg)  04/03/18 144 lb (65.3 kg)    Physical Exam Vitals signs and nursing note reviewed.  Constitutional:      General: She is not in acute distress.    Appearance: Normal appearance. She is not ill-appearing, toxic-appearing or diaphoretic.  HENT:     Head: Normocephalic and atraumatic.     Right Ear: External ear normal.     Left Ear: External ear normal.     Nose: Nose normal.     Mouth/Throat:     Mouth: Mucous membranes are moist.     Pharynx: Oropharynx is clear.  Eyes:     General: No scleral icterus.       Right eye: No discharge.        Left eye: No  discharge.     Extraocular Movements: Extraocular movements intact.     Conjunctiva/sclera: Conjunctivae normal.     Pupils: Pupils are equal, round, and reactive to light.  Neck:     Musculoskeletal: Normal range of motion and neck supple.  Cardiovascular:     Rate and Rhythm: Normal rate and regular rhythm.     Pulses: Normal pulses.     Heart sounds: Normal heart sounds. No murmur. No friction rub. No gallop.   Pulmonary:     Effort: Pulmonary effort is normal. No respiratory distress.     Breath sounds: Normal breath sounds. No stridor. No wheezing, rhonchi or rales.  Chest:     Chest wall: No tenderness.  Musculoskeletal: Normal range of motion.  Skin:    General: Skin is warm and dry.     Capillary Refill: Capillary refill takes less than 2 seconds.     Coloration: Skin is not jaundiced or pale.     Findings: No bruising, erythema, lesion or rash.  Neurological:     General: No focal deficit present.     Mental Status: She is alert and oriented to person, place, and time. Mental status is at baseline.  Psychiatric:        Mood and Affect: Mood normal.        Behavior: Behavior normal.        Thought Content: Thought content normal.        Judgment: Judgment normal.     Results for orders placed or performed in visit on 08/31/18  Comprehensive metabolic panel  Result Value Ref Range   Glucose 128 (H) 65 - 99 mg/dL   BUN 11 8 - 27 mg/dL   Creatinine, Ser 0.81 0.57 - 1.00 mg/dL   GFR calc non Af Amer 71 >59 mL/min/1.73   GFR calc Af Amer 82 >59 mL/min/1.73   BUN/Creatinine Ratio 14 12 - 28   Sodium 142 134 - 144 mmol/L   Potassium 3.4 (L) 3.5 - 5.2 mmol/L   Chloride 100 96 - 106 mmol/L   CO2 22 20 - 29 mmol/L   Calcium 9.6 8.7 - 10.3 mg/dL   Total Protein 6.7 6.0 - 8.5 g/dL   Albumin 4.2 3.5 - 4.8 g/dL   Globulin, Total 2.5 1.5 - 4.5 g/dL   Albumin/Globulin Ratio 1.7 1.2 - 2.2   Bilirubin Total 0.3 0.0 - 1.2 mg/dL   Alkaline Phosphatase 86 39 - 117 IU/L   AST  14  0 - 40 IU/L   ALT 14 0 - 32 IU/L  Lipid Panel w/o Chol/HDL Ratio  Result Value Ref Range   Cholesterol, Total 180 100 - 199 mg/dL   Triglycerides 90 0 - 149 mg/dL   HDL 73 >39 mg/dL   VLDL Cholesterol Cal 18 5 - 40 mg/dL   LDL Calculated 89 0 - 99 mg/dL  VITAMIN D 25 Hydroxy (Vit-D Deficiency, Fractures)  Result Value Ref Range   Vit D, 25-Hydroxy 44.6 30.0 - 100.0 ng/mL  CBC with Differential/Platelet  Result Value Ref Range   WBC 7.6 3.4 - 10.8 x10E3/uL   RBC 4.52 3.77 - 5.28 x10E6/uL   Hemoglobin 14.0 11.1 - 15.9 g/dL   Hematocrit 41.7 34.0 - 46.6 %   MCV 92 79 - 97 fL   MCH 31.0 26.6 - 33.0 pg   MCHC 33.6 31.5 - 35.7 g/dL   RDW 11.8 11.7 - 15.4 %   Platelets 210 150 - 450 x10E3/uL   Neutrophils 69 Not Estab. %   Lymphs 23 Not Estab. %   Monocytes 7 Not Estab. %   Eos 1 Not Estab. %   Basos 0 Not Estab. %   Neutrophils Absolute 5.2 1.4 - 7.0 x10E3/uL   Lymphocytes Absolute 1.7 0.7 - 3.1 x10E3/uL   Monocytes Absolute 0.5 0.1 - 0.9 x10E3/uL   EOS (ABSOLUTE) 0.1 0.0 - 0.4 x10E3/uL   Basophils Absolute 0.0 0.0 - 0.2 x10E3/uL   Immature Granulocytes 0 Not Estab. %   Immature Grans (Abs) 0.0 0.0 - 0.1 x10E3/uL  TSH  Result Value Ref Range   TSH 2.050 0.450 - 4.500 uIU/mL      Assessment & Plan:   Problem List Items Addressed This Visit      Genitourinary   Benign hypertensive renal disease    Under good control on current regimen. Continue current regimen. Continue to monitor. Call with any concerns.        Relevant Orders   Basic metabolic panel   UA/M w/rflx Culture, Routine     Other   Anxiety    In exacerbation due to concern about her daughter and the corona virus. We will keep medicines clear. Continue to monitor. Call with any concerns.       Depression - Primary    Stable. Will keep her medications where they are and recheck 3 months. Call with any concerns.       Memory impairment    MMSE has dropped another 2 points, seeing neurology, but cannot  see them currently due to COVID-19 pandemic. We will increase her aricept to 10mg  and recheck 3 months. Call with any concerns.       Relevant Orders   Basic metabolic panel   UA/M w/rflx Culture, Routine       Follow up plan: Return in about 3 months (around 03/05/2019) for follow up.

## 2018-12-04 NOTE — Assessment & Plan Note (Signed)
In exacerbation due to concern about her daughter and the corona virus. We will keep medicines clear. Continue to monitor. Call with any concerns.

## 2018-12-04 NOTE — Assessment & Plan Note (Addendum)
MMSE has dropped another 2 points, seeing neurology, but cannot see them currently due to COVID-19 pandemic. We will increase her aricept to 10mg  and recheck 3 months. Call with any concerns. Will get her back into see neurology.

## 2018-12-04 NOTE — Assessment & Plan Note (Signed)
Stable. Will keep her medications where they are and recheck 3 months. Call with any concerns.

## 2018-12-05 DIAGNOSIS — R413 Other amnesia: Secondary | ICD-10-CM | POA: Diagnosis not present

## 2018-12-09 ENCOUNTER — Other Ambulatory Visit: Payer: Self-pay | Admitting: Family Medicine

## 2018-12-10 NOTE — Progress Notes (Signed)
Patient did not have labs done when in the office the other day

## 2019-01-08 ENCOUNTER — Other Ambulatory Visit: Payer: Self-pay | Admitting: Family Medicine

## 2019-01-21 ENCOUNTER — Telehealth: Payer: Self-pay | Admitting: Family Medicine

## 2019-01-21 NOTE — Telephone Encounter (Signed)
Copied from [place] [id number]. Topic: Quick Communication - See Telephone Encounter >> Jan 21, 2019  5:23 PM Rutherford Nail, NT wrote: CRM for notification. See Telephone encounter for: 01/21/19. Patient's husband calling and states that the patient has been lethargic and hard to wake up for about a week. States that he feels it has recently become worse. Spoke with nurse triage, Cristal Generous, and she told me to advise patient's husband to call 911 for patient being lethargic and hard to wake up. Advised husband of this and he states that he may just take her to the ER.

## 2019-01-22 ENCOUNTER — Other Ambulatory Visit: Payer: Self-pay

## 2019-01-22 ENCOUNTER — Ambulatory Visit: Payer: Self-pay | Admitting: *Deleted

## 2019-01-22 ENCOUNTER — Ambulatory Visit: Payer: Self-pay | Admitting: Pharmacist

## 2019-01-22 ENCOUNTER — Ambulatory Visit (INDEPENDENT_AMBULATORY_CARE_PROVIDER_SITE_OTHER): Payer: Medicare Other | Admitting: Family Medicine

## 2019-01-22 ENCOUNTER — Encounter: Payer: Self-pay | Admitting: Family Medicine

## 2019-01-22 VITALS — BP 123/79 | HR 95 | Temp 98.6°F | Ht 60.0 in | Wt 122.0 lb

## 2019-01-22 DIAGNOSIS — R3129 Other microscopic hematuria: Secondary | ICD-10-CM | POA: Diagnosis not present

## 2019-01-22 DIAGNOSIS — R413 Other amnesia: Secondary | ICD-10-CM | POA: Diagnosis not present

## 2019-01-22 DIAGNOSIS — R5383 Other fatigue: Secondary | ICD-10-CM

## 2019-01-22 DIAGNOSIS — M48061 Spinal stenosis, lumbar region without neurogenic claudication: Secondary | ICD-10-CM

## 2019-01-22 DIAGNOSIS — R251 Tremor, unspecified: Secondary | ICD-10-CM

## 2019-01-22 DIAGNOSIS — R634 Abnormal weight loss: Secondary | ICD-10-CM

## 2019-01-22 DIAGNOSIS — R400 Somnolence: Secondary | ICD-10-CM | POA: Diagnosis not present

## 2019-01-22 MED ORDER — DICLOFENAC SODIUM 1 % TD GEL: 4.0000 g | Freq: Four times a day (QID) | TRANSDERMAL | 3 refills | Status: DC

## 2019-01-22 MED ORDER — DICLOFENAC SODIUM 1 % TD GEL: 4.0000 g | Freq: Four times a day (QID) | TRANSDERMAL | 12 refills | Status: DC

## 2019-01-22 MED ORDER — NITROFURANTOIN MONOHYD MACRO 100 MG PO CAPS: 100.0000 mg | ORAL_CAPSULE | Freq: Two times a day (BID) | ORAL | 0 refills | Status: DC

## 2019-01-22 NOTE — Chronic Care Management (AMB) (Signed)
Chronic Care Management   Note  01/22/2019 Name: Cynthia Vaughan MRN: [id number] DOB: 1941-04-11   Subjective:  Cynthia Vaughan is a 78 y.o. year old female who is a primary care patient of [name], DO. The CCM team was consulted for assistance with chronic disease management and care coordination needs.    Met with patient and her husband, [name], at their clinic appointment with Dr. Wynetta Emery. Concerns expressed for recently cognitive decline. Patient's response time to questions was slow in comparison to baseline, per clinic staff.   Cynthia Vaughan was given information about Chronic Care Management services today including:  1. CCM service includes personalized support from designated clinical staff supervised by her physician, including individualized plan of care and coordination with other care providers 2. 24/7 contact phone numbers for assistance for urgent and routine care needs. 3. Service will only be billed when office clinical staff spend 20 minutes or more in a month to coordinate care. 4. Only one practitioner may furnish and bill the service in a calendar month. 5. The patient may stop CCM services at any time (effective at the end of the month) by phone call to the office staff. 6. The patient will be responsible for cost sharing (co-pay) of up to 20% of the service fee (after annual deductible is met).  Patient agreed to services and verbal consent obtained.   Review of patient status, including review of consultants reports, laboratory and other test data, was performed as part of comprehensive evaluation and provision of chronic care management services.   Objective:  Lab Results  Component Value Date   CREATININE 0.81 08/31/2018   CREATININE 0.81 06/01/2018   CREATININE 0.70 12/18/2017    Lab Results  Component Value Date   HGBA1C 5.5 12/19/2017       Component Value Date/Time   CHOL 180 08/31/2018 1434   TRIG 90 08/31/2018 1434   HDL 73 08/31/2018  1434   CHOLHDL 2.8 12/19/2017 0352   VLDL 12 12/19/2017 0352   LDLCALC 89 08/31/2018 1434    Clinical ASCVD: Yes  The ASCVD Risk score Mikey Bussing DC Jr., et al., 2013) failed to calculate for the following reasons:   The patient has a prior MI or stroke diagnosis    BP Readings from Last 3 Encounters:  01/22/19 123/79  12/04/18 110/76  08/31/18 123/82    Allergies  Allergen Reactions  . Codeine Itching  . Lisinopril Cough    Medications Reviewed Today    Reviewed by De Hollingshead, [facility] (Pharmacist) on 01/22/19 at 1420  Med List Status: <None>  Medication Order Taking? Sig Documenting Provider Last Dose Status Informant  amLODipine (NORVASC) 10 MG tablet [id number] Yes Take 1 tablet (10 mg total) by mouth daily. Johnson, Megan P, DO Taking Active   ARIPiprazole (ABILIFY) 5 MG tablet [id number] Yes Take 1 tablet (5 mg total) by mouth daily. Park Liter P, DO Taking Active   aspirin EC 81 MG tablet [id number] Yes Take 81 mg by mouth daily. [provider] Taking Active Other  atorvastatin (LIPITOR) 40 MG tablet [id number] Yes Take 1 tablet (40 mg total) by mouth at bedtime. Park Liter P, DO Taking Active   diclofenac sodium (VOLTAREN) 1 % GEL [id number] Yes Apply 4 g topically 4 (four) times daily. Johnson, Megan P, DO Taking Active   donepezil (ARICEPT) 10 MG tablet [id number] Yes Take 1 tablet (10 mg total) by mouth at bedtime. Johnson, Megan P, DO Taking Active   DULoxetine (  CYMBALTA) 60 MG capsule [id number] Yes Take 1 capsule (60 mg total) by mouth daily. Johnson, Megan P, DO Taking Active   fluticasone (FLONASE) 50 MCG/ACT nasal spray [id number] Yes Place 2 sprays into both nostrils daily. Wynetta Emery, Megan P, DO Taking Active   gabapentin (NEURONTIN) 100 MG capsule [id number] Yes Take 1 capsule (100 mg total) by mouth 3 (three) times daily. Johnson, Megan P, DO Taking Active   loratadine (CLARITIN) 10 MG tablet [id number] Yes Take 1 tablet (10 mg total) by mouth daily.  Johnson, Megan P, DO Taking Active   losartan (COZAAR) 50 MG tablet [id number] Yes Take 1 tablet (50 mg total) by mouth daily. Wynetta Emery, Megan P, DO Taking Active   omeprazole (PRILOSEC) 20 MG capsule [id number] Yes Take 1 capsule (20 mg total) by mouth daily. Wynetta Emery, Megan P, DO Taking Active   ranitidine (ZANTAC) 150 MG tablet [id number] No Take 2 tablets (300 mg total) by mouth every evening.  Patient not taking:  Reported on 01/22/2019   [name], DO Not Taking Active   sodium phosphate (FLEET) 7-19 GM/118ML ENEM [id number] No Place 133 mLs (1 enema total) rectally daily as needed for severe constipation.  Patient not taking:  Reported on 01/22/2019   Arta Silence, MD Not Taking Active   traMADol (ULTRAM) 50 MG tablet [id number] No Take 1 tablet (50 mg total) by mouth 2 (two) times daily as needed.  Patient not taking:  Reported on 01/22/2019   [name], DO Not Taking Active   Vitamin D, Ergocalciferol, (DRISDOL) 50000 units CAPS capsule [id number] No Take 1 capsule (50,000 Units total) by mouth every 7 (seven) days.  Patient not taking:  Reported on 01/22/2019   [name], DO Not Taking Active            Assessment:   Goals Addressed            This Visit's Progress     Patient Stated   . "She might need help managing her medications" (pt-stated)       Current Barriers:  . Diminished manual dexterity . Cognitive Deficits  . Per Dr. Wynetta Emery, patient is not generally this groggy at baseline o Per husband's report, patient accidentally took donepezil 20 mg - 1 tab from [facility] and 1 from local community pharmacy. He realized and rectified this. Patient typically receives all chronic medications from [facility], and only acute meds from local pharmacy o Husband reports that he hasn't been giving the midday dose of gabapentin to see if that helps reduce any dizziness/grogginess. He has not seen an appreciable change.  o Patient is also the primary caregiver for  her disabled daughter.   Pharmacist Clinical Goal(s):  [name] Over the next 30 days, patient will work with primary care provider and CCM team to address needs related to optimized medication management  Interventions: . Comprehensive medication review performed.  . Discussed medications that could contribute to increased CNS sedation; however, no recent changes that would explain the recent change in patient's cognitive status. UA was clean, but Dr. Wynetta Emery decided to treat for UTI based on symptomatology.  . Patient's husband counseled to avoid combining medications from [facility] and local pharmacy, and to just use [facility]. . Counseled on max acetaminophen dosing to help with back pain; appropriate to maximize pain treatments that do not cause CNS sedation  Patient Self Care Activities:  . Currently UNABLE TO independently manage health  Initial goal documentation  Plan: - Will collaborate with RN CM [name] to for caregiver support concerns related to patient's disabled daughter.   [name], PharmD Clinical Pharmacist [place] [phone]

## 2019-01-22 NOTE — Patient Instructions (Signed)
Visit Information  Goals Addressed            This Visit's Progress     Patient Stated   . "She might need help managing her medications" (pt-stated)       Current Barriers:  . Diminished manual dexterity . Cognitive Deficits  . Per Dr. Wynetta Emery, patient is not generally this groggy at baseline o Per husband's report, patient accidentally took donepezil 20 mg - 1 tab from [facility] and 1 from local community pharmacy. He realized and rectified this. Patient typically receives all chronic medications from [facility], and only acute meds from local pharmacy o Husband reports that he hasn't been giving the midday dose of gabapentin to see if that helps reduce any dizziness/grogginess. He has not seen an appreciable change.  o Patient is also the primary caregiver for her disabled daughter.   Pharmacist Clinical Goal(s):  [name] Over the next 30 days, patient will work with primary care provider and CCM team to address needs related to optimized medication management  Interventions: . Comprehensive medication review performed.  . Discussed medications that could contribute to increased CNS sedation; however, no recent changes that would explain the recent change in patient's cognitive status. UA was clean, but Dr. Wynetta Emery decided to treat for UTI based on symptomatology.  . Patient's husband counseled to avoid combining medications from [facility] and local pharmacy, and to just use [facility]. . Counseled on max acetaminophen dosing to help with back pain; appropriate to maximize pain treatments that do not cause CNS sedation  Patient Self Care Activities:  . Currently UNABLE TO independently manage health  Initial goal documentation        Cynthia Vaughan was given information about Chronic Care Management services today including:  1. CCM service includes personalized support from designated clinical staff supervised by her physician, including individualized plan of care and coordination  with other care providers 2. 24/7 contact phone numbers for assistance for urgent and routine care needs. 3. Service will only be billed when office clinical staff spend 20 minutes or more in a month to coordinate care. 4. Only one practitioner may furnish and bill the service in a calendar month. 5. The patient may stop CCM services at any time (effective at the end of the month) by phone call to the office staff. 6. The patient will be responsible for cost sharing (co-pay) of up to 20% of the service fee (after annual deductible is met).  Patient agreed to services and verbal consent obtained.   The patient verbalized understanding of instructions provided today and declined a print copy of patient instruction materials.   Plan: - Will collaborate with RN CM Cynthia Vaughan to for caregiver support concerns related to patient's disabled daughter.   Cynthia Vaughan, PharmD Clinical Pharmacist [place] [phone]

## 2019-01-22 NOTE — Progress Notes (Signed)
BP 123/79   Pulse 95   Temp 98.6 F (37 C) (Oral)   Ht 5' (1.524 m)   Wt 122 lb (55.3 kg)   LMP  (LMP Unknown)   SpO2 94%   BMI 23.83 kg/m    Subjective:    Patient ID: Cynthia Vaughan, female    DOB: [birth year], 78 y.o.   MRN: [id number]  HPI: Cynthia Vaughan is a 78 y.o. female  Chief Complaint  Patient presents with  . Fatigue    Nervous/Shakes progressively worse, no appitite,    Has been significantly more fatigued for the past month or so, has been getting worse. No confusion. No changes in behavior per her husband, however, on exam today she is very slowed and blunted- which is not her usual behavior. They note that she has been sitting a chair staring into space for the most of day. They state that she has been very weak, having trouble getting out of the bed. Having trouble with doing her ADLs. Doesn't want to do anything. Has had touble pushing down on the recliner. Has trouble getting up into the bed. Has trouble swinging her legs over. No burning when she pees. No other symptoms. She does note that she has developed a severe tremor over the past month and cannot bring a cup to her mouth because she has been shaking too badly. No shaking today. No other concerns or complaints at this time.   Relevant past medical, surgical, family and social history reviewed and updated as indicated. Interim medical history since our last visit reviewed. Allergies and medications reviewed and updated.  Review of Systems  Constitutional: Positive for activity change, fatigue and unexpected weight change. Negative for appetite change, chills, diaphoresis and fever.  HENT: Negative.   Respiratory: Negative.   Cardiovascular: Negative.   Gastrointestinal: Negative.   Genitourinary: Negative.   Musculoskeletal: Negative.   Skin: Negative.   Neurological: Positive for tremors, weakness and light-headedness. Negative for dizziness, seizures, syncope, facial asymmetry, speech difficulty,  numbness and headaches.  Psychiatric/Behavioral: Positive for dysphoric mood. Negative for agitation, behavioral problems, confusion, decreased concentration, hallucinations, self-injury, sleep disturbance and suicidal ideas. The patient is not nervous/anxious and is not hyperactive.     Per HPI unless specifically indicated above     Objective:    BP 123/79   Pulse 95   Temp 98.6 F (37 C) (Oral)   Ht 5' (1.524 m)   Wt 122 lb (55.3 kg)   LMP  (LMP Unknown)   SpO2 94%   BMI 23.83 kg/m   Wt Readings from Last 3 Encounters:  01/22/19 122 lb (55.3 kg)  08/31/18 134 lb (60.8 kg)  06/01/18 141 lb (64 kg)    Physical Exam Vitals signs and nursing note reviewed.  Constitutional:      General: She is not in acute distress.    Appearance: Normal appearance. She is ill-appearing. She is not toxic-appearing or diaphoretic.  HENT:     Head: Normocephalic and atraumatic.     Right Ear: External ear normal.     Left Ear: External ear normal.     Nose: Nose normal.     Mouth/Throat:     Mouth: Mucous membranes are moist.     Pharynx: Oropharynx is clear.  Eyes:     General: No scleral icterus.       Right eye: No discharge.        Left eye: No discharge.     Extraocular  Movements: Extraocular movements intact.     Conjunctiva/sclera: Conjunctivae normal.     Pupils: Pupils are equal, round, and reactive to light.  Neck:     Musculoskeletal: Normal range of motion and neck supple.  Cardiovascular:     Rate and Rhythm: Normal rate and regular rhythm.     Pulses: Normal pulses.     Heart sounds: Normal heart sounds. No murmur. No friction rub. No gallop.   Pulmonary:     Effort: Pulmonary effort is normal. No respiratory distress.     Breath sounds: Normal breath sounds. No stridor. No wheezing, rhonchi or rales.  Chest:     Chest wall: No tenderness.  Abdominal:     General: Abdomen is flat. Bowel sounds are normal. There is no distension.     Palpations: Abdomen is soft.  There is no mass.     Tenderness: There is no abdominal tenderness. There is no right CVA tenderness, left CVA tenderness, guarding or rebound.     Hernia: No hernia is present.  Musculoskeletal: Normal range of motion.  Skin:    General: Skin is warm and dry.     Capillary Refill: Capillary refill takes less than 2 seconds.     Coloration: Skin is not jaundiced or pale.     Findings: No bruising, erythema, lesion or rash.  Neurological:     General: No focal deficit present.     Mental Status: She is alert and oriented to person, place, and time. Mental status is at baseline.  Psychiatric:        Mood and Affect: Mood normal.        Behavior: Behavior normal.        Thought Content: Thought content normal.        Judgment: Judgment normal.     Results for orders placed or performed in visit on 01/22/19  Microscopic Examination  Result Value Ref Range   WBC, UA 0-5 0 - 5 /hpf   RBC 0-2 0 - 2 /hpf   Epithelial Cells (non renal) 0-10 0 - 10 /hpf   Bacteria, UA Few (A) None seen/Few  Urine Culture, Reflex  Result Value Ref Range   Urine Culture, Routine WILL FOLLOW   CBC with Differential/Platelet  Result Value Ref Range   WBC 10.3 3.4 - 10.8 x10E3/uL   RBC 4.82 3.77 - 5.28 x10E6/uL   Hemoglobin 15.6 11.1 - 15.9 g/dL   Hematocrit 44.9 34.0 - 46.6 %   MCV 93 79 - 97 fL   MCH 32.4 26.6 - 33.0 pg   MCHC 34.7 31.5 - 35.7 g/dL   RDW 12.3 11.7 - 15.4 %   Platelets 245 150 - 450 x10E3/uL   Neutrophils 77 Not Estab. %   Lymphs 17 Not Estab. %   Monocytes 6 Not Estab. %   Eos 0 Not Estab. %   Basos 0 Not Estab. %   Neutrophils Absolute 7.8 (H) 1.4 - 7.0 x10E3/uL   Lymphocytes Absolute 1.8 0.7 - 3.1 x10E3/uL   Monocytes Absolute 0.6 0.1 - 0.9 x10E3/uL   EOS (ABSOLUTE) 0.0 0.0 - 0.4 x10E3/uL   Basophils Absolute 0.0 0.0 - 0.2 x10E3/uL   Immature Granulocytes 0 Not Estab. %   Immature Grans (Abs) 0.0 0.0 - 0.1 x10E3/uL  UA/M w/rflx Culture, Routine  Result Value Ref Range    Specific Gravity, UA 1.015 1.005 - 1.030   pH, UA 6.5 5.0 - 7.5   Color, UA Yellow Yellow   Appearance  Ur Clear Clear   Leukocytes,UA Negative Negative   Protein,UA 1+ (A) Negative/Trace   Glucose, UA Negative Negative   Ketones, UA Negative Negative   RBC, UA Trace (A) Negative   Bilirubin, UA Negative Negative   Urobilinogen, Ur 2.0 (H) 0.2 - 1.0 mg/dL   Nitrite, UA Negative Negative   Microscopic Examination See below:    Urinalysis Reflex Comment   Comprehensive metabolic panel  Result Value Ref Range   Glucose 140 (H) 65 - 99 mg/dL   BUN 9 8 - 27 mg/dL   Creatinine, Ser 0.85 0.57 - 1.00 mg/dL   GFR calc non Af Amer 67 >59 mL/min/1.73   GFR calc Af Amer 77 >59 mL/min/1.73   BUN/Creatinine Ratio 11 (L) 12 - 28   Sodium 140 134 - 144 mmol/L   Potassium 3.5 3.5 - 5.2 mmol/L   Chloride 94 (L) 96 - 106 mmol/L   CO2 26 20 - 29 mmol/L   Calcium 10.0 8.7 - 10.3 mg/dL   Total Protein 7.5 6.0 - 8.5 g/dL   Albumin 4.5 3.7 - 4.7 g/dL   Globulin, Total 3.0 1.5 - 4.5 g/dL   Albumin/Globulin Ratio 1.5 1.2 - 2.2   Bilirubin Total 0.4 0.0 - 1.2 mg/dL   Alkaline Phosphatase 86 39 - 117 IU/L   AST 23 0 - 40 IU/L   ALT 15 0 - 32 IU/L  TSH  Result Value Ref Range   TSH 1.810 0.450 - 4.500 uIU/mL      Assessment & Plan:   Problem List Items Addressed This Visit      Other   Spinal stenosis of lumbar region    Continues on gabapentin. Will send in voltaren gel for her back. Will hold on tramadol given her lethargy and altered mental status today.      Relevant Orders   Ambulatory referral to Home Health   Memory impairment    Very altered today. Unclear if illness or natural progression of her dementia. Checking labs. Get her into see neurology. Call with any concerns.       Relevant Orders   Ambulatory referral to [facility]    Other Visit Diagnoses    Lethargy    -  Primary   Quite altered today. Will get her into see neurology for evaluation of the tremor and check labs.  Recheck 1 week. Monitor closely. Will get home health and CCM.   Relevant Orders   CBC with Differential/Platelet (Completed)   UA/M w/rflx Culture, Routine (Completed)   Comprehensive metabolic panel (Completed)   TSH (Completed)   Referral to Chronic Care Management Services   Ambulatory referral to Home Health   Somnolence       Quite altered today. Will get her into see neurology for evaluation of the tremor and check labs. Recheck 1 week. Monitor closely. Will get home health and CCM.   Other microscopic hematuria       No sign of UTI, but significantly alterned today- will get culture and will treat, recheck 1 week.    Weight loss       Patient has lost 12lbs in the last few months. Advised starting ensure. Call with any concerns.    Tremor       No sign on tremor on exam today, Howevre patient and husband state that it is severe- will get her into see neurology. Call with any concerns.        Follow up plan: Return in about 1  week (around 01/29/2019).

## 2019-01-23 LAB — CBC WITH DIFFERENTIAL/PLATELET
Basophils Absolute: 0 10*3/uL (ref 0.0–0.2)
Basos: 0 %
EOS (ABSOLUTE): 0 10*3/uL (ref 0.0–0.4)
Eos: 0 %
Hematocrit: 44.9 % (ref 34.0–46.6)
Hemoglobin: 15.6 g/dL (ref 11.1–15.9)
Immature Grans (Abs): 0 10*3/uL (ref 0.0–0.1)
Immature Granulocytes: 0 %
Lymphocytes Absolute: 1.8 10*3/uL (ref 0.7–3.1)
Lymphs: 17 %
MCH: 32.4 pg (ref 26.6–33.0)
MCHC: 34.7 g/dL (ref 31.5–35.7)
MCV: 93 fL (ref 79–97)
Monocytes Absolute: 0.6 10*3/uL (ref 0.1–0.9)
Monocytes: 6 %
Neutrophils Absolute: 7.8 10*3/uL — ABNORMAL HIGH (ref 1.4–7.0)
Neutrophils: 77 %
Platelets: 245 10*3/uL (ref 150–450)
RBC: 4.82 x10E6/uL (ref 3.77–5.28)
RDW: 12.3 % (ref 11.7–15.4)
WBC: 10.3 10*3/uL (ref 3.4–10.8)

## 2019-01-23 LAB — COMPREHENSIVE METABOLIC PANEL
ALT: 15 IU/L (ref 0–32)
AST: 23 IU/L (ref 0–40)
Albumin/Globulin Ratio: 1.5 (ref 1.2–2.2)
Albumin: 4.5 g/dL (ref 3.7–4.7)
Alkaline Phosphatase: 86 IU/L (ref 39–117)
BUN/Creatinine Ratio: 11 — ABNORMAL LOW (ref 12–28)
BUN: 9 mg/dL (ref 8–27)
Bilirubin Total: 0.4 mg/dL (ref 0.0–1.2)
CO2: 26 mmol/L (ref 20–29)
Calcium: 10 mg/dL (ref 8.7–10.3)
Chloride: 94 mmol/L — ABNORMAL LOW (ref 96–106)
Creatinine, Ser: 0.85 mg/dL (ref 0.57–1.00)
GFR calc Af Amer: 77 mL/min/{1.73_m2} (ref >59)
GFR calc non Af Amer: 67 mL/min/{1.73_m2} (ref >59)
Globulin, Total: 3 g/dL (ref 1.5–4.5)
Glucose: 140 mg/dL — ABNORMAL HIGH (ref 65–99)
Potassium: 3.5 mmol/L (ref 3.5–5.2)
Sodium: 140 mmol/L (ref 134–144)
Total Protein: 7.5 g/dL (ref 6.0–8.5)

## 2019-01-23 LAB — TSH: TSH: 1.81 u[IU]/mL (ref 0.450–4.500)

## 2019-01-23 NOTE — Assessment & Plan Note (Signed)
Very altered today. Unclear if illness or natural progression of her dementia. Checking labs. Get her into see neurology. Call with any concerns.

## 2019-01-23 NOTE — Assessment & Plan Note (Signed)
Continues on gabapentin. Will send in voltaren gel for her back. Will hold on tramadol given her lethargy and altered mental status today.

## 2019-01-24 LAB — UA/M W/RFLX CULTURE, ROUTINE
Bilirubin, UA: NEGATIVE
Glucose, UA: NEGATIVE
Ketones, UA: NEGATIVE
Leukocytes,UA: NEGATIVE
Nitrite, UA: NEGATIVE
Specific Gravity, UA: 1.015 (ref 1.005–1.030)
Urobilinogen, Ur: 2 mg/dL — ABNORMAL HIGH (ref 0.2–1.0)
pH, UA: 6.5 (ref 5.0–7.5)

## 2019-01-24 LAB — MICROSCOPIC EXAMINATION

## 2019-01-24 LAB — URINE CULTURE, REFLEX

## 2019-01-24 NOTE — Patient Instructions (Signed)
Thank you allowing the Chronic Care Management Team to be a part of your care! It was a pleasure speaking with you today!   CCM (Chronic Care Management) Team   Mayukha Symmonds RN, BSN Nurse Care Coordinator  [phone]  Catie [facility] PharmD  Clinical Pharmacist  [phone]  Eula Fried LCSW Clinical Social Worker [phone]  Goals Addressed            This Visit's Progress   . I just so sleepy and I don't know why (pt-stated)       Current Barriers:  [name] Knowledge Deficits related to recent symptom of increased letharygy   Nurse Case Manager Clinical Goal(s):  [name] Over the next 90 days, patient will work with [facility] to address needs related to incresed letharygy  Interventions:  . Provided education to patient re: on CCM and what resources CCM is availible to provide   . Met with patient and her spouse in exam room. Nash Dimmer with PCP about patients needs . 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 Provided CCM contact information  Patient Self Care Activities:  . Currently UNABLE TO independently maintain activities of daily living realted to increased lethargy  Initial goal documentation        The patient verbalized understanding of instructions provided today and declined a print copy of patient instruction materials.   The care management team will reach out to the patient again over the next 14 days.  The patient has been provided with contact information for the care management team and has been advised to call with any health related questions or concerns.

## 2019-01-24 NOTE — Chronic Care Management (AMB) (Signed)
  Chronic Care Management   Initial Visit Note  01/24/2019 Name: Cynthia Vaughan MRN: [id number] DOB: July 22, 1941  Referred by: [name], DO Reason for referral : Chronic Care Management (lethargy )   AVANTIKA SHERE is a 78 y.o. year old female who is a primary care patient of [name], DO. The CCM team was consulted for assistance with chronic disease management and care coordination needs.   Review of patient status, including review of consultants reports, relevant laboratory and other test results, and collaboration with appropriate care team members and the patient's provider was performed as part of comprehensive patient evaluation and provision of chronic care management services.    SDOH (Social Determinants of Health) screening performed today. See Care Plan Entry related to challenges with: Stress Care giver strain  Objective:   Goals Addressed            This Visit's Progress   . I just so sleepy and I don't know why (pt-stated)       Current Barriers:  [name] Knowledge Deficits related to recent symptom of increased letharygy   Nurse Case Manager Clinical Goal(s):  [name] Over the next 90 days, patient will work with [facility] to address needs related to incresed letharygy  Interventions:  . Provided education to patient re: on CCM and what resources CCM is availible to provide   . Met with patient and her spouse in exam room. Nash Dimmer with PCP about patients needs . 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 Provided CCM contact information  Patient Self Care Activities:  . Currently UNABLE TO independently maintain activities of daily living realted to increased lethargy  Initial goal documentation         [name] was given information about Chronic Care Management services today including:  1. CCM service includes personalized support from designated clinical staff supervised by her physician, including  individualized plan of care and coordination with other care providers 2. 24/7 contact phone numbers for assistance for urgent and routine care needs. 3. Service will only be billed when office clinical staff spend 20 minutes or more in a month to coordinate care. 4. Only one practitioner may furnish and bill the service in a calendar month. 5. The patient may stop CCM services at any time (effective at the end of the month) by phone call to the office staff. 6. The patient will be responsible for cost sharing (co-pay) of up to 20% of the service fee (after annual deductible is met).  Patient agreed to services and verbal consent obtained.   Plan:   The care management team will reach out to the patient again over the next 14 days.  The patient has been provided with contact information for the care management team and has been advised to call with any health related questions or concerns.   Merlene Morse Harleen Fineberg RN, BSN Nurse Case Editor, [facility]ing [facility] Care Management  [phone]) Business Mobile

## 2019-01-25 ENCOUNTER — Telehealth: Payer: Self-pay | Admitting: Family Medicine

## 2019-01-25 DIAGNOSIS — R251 Tremor, unspecified: Secondary | ICD-10-CM | POA: Insufficient documentation

## 2019-01-25 DIAGNOSIS — R413 Other amnesia: Secondary | ICD-10-CM | POA: Diagnosis not present

## 2019-01-25 NOTE — Telephone Encounter (Signed)
Called and left patient a VM asking for her to please return my call.  

## 2019-01-25 NOTE — Telephone Encounter (Signed)
Please let her know that everything is normal and check in on how she's doing. Thanks!

## 2019-01-25 NOTE — Telephone Encounter (Signed)
I think that's a great idea. Thank you.

## 2019-01-25 NOTE — Telephone Encounter (Signed)
Called and spoke to patient. She states that she is not feeling any better. Patient's husband states that they saw Dr. Melrose Nakayama today and they are sending her to a psychiatrist to see if how she is feeling has to do with depression.

## 2019-01-26 DIAGNOSIS — F419 Anxiety disorder, unspecified: Secondary | ICD-10-CM | POA: Diagnosis not present

## 2019-01-26 DIAGNOSIS — R2689 Other abnormalities of gait and mobility: Secondary | ICD-10-CM | POA: Diagnosis not present

## 2019-01-26 DIAGNOSIS — M5135 Other intervertebral disc degeneration, thoracolumbar region: Secondary | ICD-10-CM | POA: Diagnosis not present

## 2019-01-26 DIAGNOSIS — F322 Major depressive disorder, single episode, severe without psychotic features: Secondary | ICD-10-CM | POA: Diagnosis not present

## 2019-01-26 DIAGNOSIS — M1990 Unspecified osteoarthritis, unspecified site: Secondary | ICD-10-CM | POA: Diagnosis not present

## 2019-01-26 DIAGNOSIS — M48061 Spinal stenosis, lumbar region without neurogenic claudication: Secondary | ICD-10-CM | POA: Diagnosis not present

## 2019-01-26 DIAGNOSIS — N182 Chronic kidney disease, stage 2 (mild): Secondary | ICD-10-CM | POA: Diagnosis not present

## 2019-01-26 DIAGNOSIS — G3184 Mild cognitive impairment, so stated: Secondary | ICD-10-CM | POA: Diagnosis not present

## 2019-01-26 DIAGNOSIS — R531 Weakness: Secondary | ICD-10-CM | POA: Diagnosis not present

## 2019-01-26 DIAGNOSIS — G459 Transient cerebral ischemic attack, unspecified: Secondary | ICD-10-CM | POA: Diagnosis not present

## 2019-01-26 DIAGNOSIS — R413 Other amnesia: Secondary | ICD-10-CM | POA: Diagnosis not present

## 2019-01-26 DIAGNOSIS — Z7982 Long term (current) use of aspirin: Secondary | ICD-10-CM | POA: Diagnosis not present

## 2019-01-26 DIAGNOSIS — R5383 Other fatigue: Secondary | ICD-10-CM | POA: Diagnosis not present

## 2019-01-28 DIAGNOSIS — F322 Major depressive disorder, single episode, severe without psychotic features: Secondary | ICD-10-CM | POA: Diagnosis not present

## 2019-01-28 DIAGNOSIS — M48061 Spinal stenosis, lumbar region without neurogenic claudication: Secondary | ICD-10-CM | POA: Diagnosis not present

## 2019-01-28 DIAGNOSIS — R413 Other amnesia: Secondary | ICD-10-CM | POA: Diagnosis not present

## 2019-01-28 DIAGNOSIS — G3184 Mild cognitive impairment, so stated: Secondary | ICD-10-CM | POA: Diagnosis not present

## 2019-01-28 DIAGNOSIS — G459 Transient cerebral ischemic attack, unspecified: Secondary | ICD-10-CM | POA: Diagnosis not present

## 2019-01-28 DIAGNOSIS — M5135 Other intervertebral disc degeneration, thoracolumbar region: Secondary | ICD-10-CM | POA: Diagnosis not present

## 2019-01-30 ENCOUNTER — Ambulatory Visit: Payer: Self-pay | Admitting: *Deleted

## 2019-01-30 ENCOUNTER — Ambulatory Visit: Payer: Medicare Other | Admitting: Pharmacist

## 2019-01-30 DIAGNOSIS — R5383 Other fatigue: Secondary | ICD-10-CM

## 2019-01-30 DIAGNOSIS — R413 Other amnesia: Secondary | ICD-10-CM | POA: Diagnosis not present

## 2019-01-30 DIAGNOSIS — F322 Major depressive disorder, single episode, severe without psychotic features: Secondary | ICD-10-CM | POA: Diagnosis not present

## 2019-01-30 DIAGNOSIS — M48061 Spinal stenosis, lumbar region without neurogenic claudication: Secondary | ICD-10-CM | POA: Diagnosis not present

## 2019-01-30 DIAGNOSIS — M5135 Other intervertebral disc degeneration, thoracolumbar region: Secondary | ICD-10-CM | POA: Diagnosis not present

## 2019-01-30 DIAGNOSIS — G459 Transient cerebral ischemic attack, unspecified: Secondary | ICD-10-CM | POA: Diagnosis not present

## 2019-01-30 DIAGNOSIS — G3184 Mild cognitive impairment, so stated: Secondary | ICD-10-CM | POA: Diagnosis not present

## 2019-01-30 NOTE — Patient Instructions (Signed)
Visit Information  Goals Addressed            This Visit's Progress     Patient Stated   . "She might need help managing her medications" (pt-stated)       Current Barriers:  . Diminished manual dexterity . Cognitive Deficits  . Abnormal decline in cognitive function with excessive sleepiness over the past few weeks . Of note, patient had been taking donepezil 20 mg daily instead of 10 mg; however, husband noticed this after about a week of therapy. This was about 2-3 weeks ago, and did not coincide with the start of symptoms, nor did it resolve after rectifying donepezil dosing . Husband notes that he stopped giving the mid day dose of gabapentin for a few days, but did not see a difference in sedation . Receives all medications from [facility] mail order  Pharmacist Clinical Goal(s):  [name] Over the next 30 days, patient will work with primary care provider and CCM team to address needs related to optimized medication management  Interventions: . Contacted [facility] to ensure appropriate medication dosing. They are filling the following:  o 10 am: amlodipine 10 mg, duloxetine 60 mg, gabapentin 100 mg, loratadine 10 mg, losartan 50 mg, omeprazole 20 mg  o 3 pm: gabapentin 100 mg o 9 pm: aripiprazole 5 mg, atorvastatin 40 mg, donepezil 10 mg, gabapentin 100 mg   Patient Self Care Activities:  . Currently UNABLE TO independently manage health  Please see past updates related to this goal by clicking on the "Past Updates" button in the selected goal         The patient verbalized understanding of instructions provided today and declined a print copy of patient instruction materials.   Plan:  - Will continue to follow patient chronically with CCM team for support.   [name], PharmD Clinical Pharmacist [place] [phone]

## 2019-01-30 NOTE — Chronic Care Management (AMB) (Signed)
  Chronic Care Management   Follow Up Note   01/30/2019 Name: Cynthia Vaughan MRN: [id number] DOB: 1940-10-24  Referred by: Cynthia Roys, DO Reason for referral : Chronic Care Management (Medication Management)   Cynthia Vaughan is a 78 y.o. year old female who is a primary care patient of Cynthia Roys, DO. The CCM team was consulted for assistance with chronic disease management and care coordination needs.    Contacted patient's husband jointly with Cynthia Minor, RN CM, to see if any change in mental status since last week.   Review of patient status, including review of consultants reports, relevant laboratory and other test results, and collaboration with appropriate care team members and the patient's provider was performed as part of comprehensive patient evaluation and provision of chronic care management services.    Goals Addressed            This Visit's Progress     Patient Stated   . "She might need help managing her medications" (pt-stated)       Current Barriers:  . Diminished manual dexterity . Cognitive Deficits  . Abnormal decline in cognitive function with excessive sleepiness over the past few weeks . Of note, patient had been taking donepezil 20 mg daily instead of 10 mg; however, husband noticed this after about a week of therapy. This was about 2-3 weeks ago, and did not coincide with the start of symptoms, nor did it resolve after rectifying donepezil dosing . Husband notes that he stopped giving the mid day dose of gabapentin for a few days, but did not see a difference in sedation . Receives all medications from [facility] [place] mail order  Pharmacist Clinical Goal(s):  [name] Over the next 30 days, patient will work with primary care provider and CCM team to address needs related to optimized medication management  Interventions: . Contacted [facility] to ensure appropriate medication dosing. They are filling the following:  o 10 am: amlodipine 10 mg,  duloxetine 60 mg, gabapentin 100 mg, loratadine 10 mg, losartan 50 mg, omeprazole 20 mg  o 3 pm: gabapentin 100 mg o 9 pm: aripiprazole 5 mg, atorvastatin 40 mg, donepezil 10 mg, gabapentin 100 mg   Patient Self Care Activities:  . Currently UNABLE TO independently manage health  Please see past updates related to this goal by clicking on the "Past Updates" button in the selected goal          Plan:  - Will continue to follow patient chronically with CCM team for support.   [name], PharmD Clinical Pharmacist [place] [phone]

## 2019-01-31 ENCOUNTER — Other Ambulatory Visit: Payer: Self-pay

## 2019-01-31 ENCOUNTER — Ambulatory Visit: Payer: Medicare Other | Admitting: Family Medicine

## 2019-01-31 ENCOUNTER — Encounter: Payer: Self-pay | Admitting: Family Medicine

## 2019-01-31 VITALS — BP 112/77 | HR 88 | Temp 97.6°F | Ht 60.0 in | Wt 119.0 lb

## 2019-01-31 DIAGNOSIS — R413 Other amnesia: Secondary | ICD-10-CM

## 2019-01-31 DIAGNOSIS — R634 Abnormal weight loss: Secondary | ICD-10-CM

## 2019-01-31 DIAGNOSIS — M48061 Spinal stenosis, lumbar region without neurogenic claudication: Secondary | ICD-10-CM

## 2019-01-31 DIAGNOSIS — F331 Major depressive disorder, recurrent, moderate: Secondary | ICD-10-CM

## 2019-01-31 DIAGNOSIS — R400 Somnolence: Secondary | ICD-10-CM

## 2019-01-31 DIAGNOSIS — F419 Anxiety disorder, unspecified: Secondary | ICD-10-CM

## 2019-01-31 DIAGNOSIS — R5383 Other fatigue: Secondary | ICD-10-CM

## 2019-01-31 MED ORDER — DICLOFENAC SODIUM 1 % TD GEL: 4.0000 g | Freq: Four times a day (QID) | TRANSDERMAL | 3 refills | Status: DC

## 2019-01-31 NOTE — Assessment & Plan Note (Signed)
Continue her aricept. Unclear if this is dementia, depression, or something else. Continue to follow with neurology. Will get her in with geri-psychiatry. Continue to monitor. Recheck 2 weeks.

## 2019-01-31 NOTE — Assessment & Plan Note (Signed)
States that she has no motivation, affect and behavior significantly altered. Has been staring into space for hours- very different from her normal behavior and acute within the last month, a significant change. Labs normal. Neurology thought psychiatric. Unclear if this is what's going on. Will check in in 2 weeks. Keep appointment with local psychiatry, but referral to geri-psych made today as I'm concerned that she may need dual-diagnosis. Continue to monitor closely.

## 2019-01-31 NOTE — Assessment & Plan Note (Signed)
Wants refill on her tramadol. Continues to be significantly altered. Advised voltaren gel. Avoid any opiate at this time. Continue to monitor.

## 2019-01-31 NOTE — Progress Notes (Signed)
BP 112/77   Pulse 88   Temp 97.6 F (36.4 C) (Oral)   Ht 5' (1.524 m)   Wt 119 lb (54 kg)   LMP  (LMP Unknown)   SpO2 96%   BMI 23.24 kg/m    Subjective:    Patient ID: Cynthia Vaughan, female    DOB: 14-Nov-1940, 78 y.o.   MRN: [id number]  HPI: Cynthia Vaughan is a 78 y.o. female  Chief Complaint  Patient presents with  . Fatigue    1w f/u. pt's husband states she's still not doing well   Saw neurology on 01/25/19- 3 days after last visit here. Did not clearly articulate her altered mental status at that time, just said her mood was worse and that she was tired. They did not mention that she has just been staring into space and not getting up and moving. They did discuss the tremor that has been going on for about a month. They referred her to geri-psych. 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 No treatment at this time. Continuing to monitor it.   Has gotten home health out and has been getting CCM involved to try to help with medication management, pill packing, PT, OT and monitoring.   Husband notes no difference in anything. Still staring into space. Still no motivation. Still moving very slowly. Still shaking. Still losing weight. No other concerns. No other changes.   Relevant past medical, surgical, family and social history reviewed and updated as indicated. Interim medical history since our last visit reviewed. Allergies and medications reviewed and updated.  Review of Systems  Constitutional: Negative.   Respiratory: Negative.   Cardiovascular: Negative.   Gastrointestinal: Negative.   Genitourinary: Negative.   Musculoskeletal: Positive for arthralgias. Negative for back pain, gait problem, joint swelling, myalgias, neck pain and neck stiffness.  Skin: Negative.   Neurological: Negative.   Psychiatric/Behavioral: Positive for confusion. Negative for agitation, behavioral problems, decreased concentration, dysphoric mood, hallucinations, self-injury, sleep  disturbance and suicidal ideas. The patient is not nervous/anxious and is not hyperactive.     Per HPI unless specifically indicated above     Objective:    BP 112/77   Pulse 88   Temp 97.6 F (36.4 C) (Oral)   Ht 5' (1.524 m)   Wt 119 lb (54 kg)   LMP  (LMP Unknown)   SpO2 96%   BMI 23.24 kg/m   Wt Readings from Last 3 Encounters:  01/31/19 119 lb (54 kg)  01/22/19 122 lb (55.3 kg)  08/31/18 134 lb (60.8 kg)    Physical Exam Vitals signs and nursing note reviewed.  Constitutional:      General: She is not in acute distress.    Appearance: Normal appearance. She is not ill-appearing, toxic-appearing or diaphoretic.  HENT:     Head: Normocephalic and atraumatic.     Right Ear: External ear normal.     Left Ear: External ear normal.     Nose: Nose normal.     Mouth/Throat:     Mouth: Mucous membranes are moist.     Pharynx: Oropharynx is clear.  Eyes:     General: No scleral icterus.       Right eye: No discharge.        Left eye: No discharge.     Extraocular Movements: Extraocular movements intact.     Conjunctiva/sclera: Conjunctivae normal.     Pupils: Pupils are equal, round, and reactive to light.  Neck:  Musculoskeletal: Normal range of motion and neck supple.  Cardiovascular:     Rate and Rhythm: Normal rate and regular rhythm.     Pulses: Normal pulses.     Heart sounds: Normal heart sounds. No murmur. No friction rub. No gallop.   Pulmonary:     Effort: Pulmonary effort is normal. No respiratory distress.     Breath sounds: Normal breath sounds. No stridor. No wheezing, rhonchi or rales.  Chest:     Chest wall: No tenderness.  Musculoskeletal: Normal range of motion.  Skin:    General: Skin is warm and dry.     Capillary Refill: Capillary refill takes less than 2 seconds.     Coloration: Skin is not jaundiced or pale.     Findings: No bruising, erythema, lesion or rash.  Neurological:     General: No focal deficit present.     Mental Status:  She is alert and oriented to person, place, and time. Mental status is at baseline.  Psychiatric:        Mood and Affect: Affect is blunt and flat.        Speech: Speech is delayed.        Behavior: Behavior is slowed.        Thought Content: Thought content normal.        Judgment: Judgment normal.     Results for orders placed or performed in visit on 01/22/19  Microscopic Examination   URINE  Result Value Ref Range   WBC, UA 0-5 0 - 5 /hpf   RBC 0-2 0 - 2 /hpf   Epithelial Cells (non renal) 0-10 0 - 10 /hpf   Bacteria, UA Few (A) None seen/Few  Urine Culture, Reflex   URINE  Result Value Ref Range   Urine Culture, Routine Final report    Organism ID, Bacteria Comment   CBC with Differential/Platelet  Result Value Ref Range   WBC 10.3 3.4 - 10.8 x10E3/uL   RBC 4.82 3.77 - 5.28 x10E6/uL   Hemoglobin 15.6 11.1 - 15.9 g/dL   Hematocrit 44.9 34.0 - 46.6 %   MCV 93 79 - 97 fL   MCH 32.4 26.6 - 33.0 pg   MCHC 34.7 31.5 - 35.7 g/dL   RDW 12.3 11.7 - 15.4 %   Platelets 245 150 - 450 x10E3/uL   Neutrophils 77 Not Estab. %   Lymphs 17 Not Estab. %   Monocytes 6 Not Estab. %   Eos 0 Not Estab. %   Basos 0 Not Estab. %   Neutrophils Absolute 7.8 (H) 1.4 - 7.0 x10E3/uL   Lymphocytes Absolute 1.8 0.7 - 3.1 x10E3/uL   Monocytes Absolute 0.6 0.1 - 0.9 x10E3/uL   EOS (ABSOLUTE) 0.0 0.0 - 0.4 x10E3/uL   Basophils Absolute 0.0 0.0 - 0.2 x10E3/uL   Immature Granulocytes 0 Not Estab. %   Immature Grans (Abs) 0.0 0.0 - 0.1 x10E3/uL  UA/M w/rflx Culture, Routine   Specimen: Urine   URINE  Result Value Ref Range   Specific Gravity, UA 1.015 1.005 - 1.030   pH, UA 6.5 5.0 - 7.5   Color, UA Yellow Yellow   Appearance Ur Clear Clear   Leukocytes,UA Negative Negative   Protein,UA 1+ (A) Negative/Trace   Glucose, UA Negative Negative   Ketones, UA Negative Negative   RBC, UA Trace (A) Negative   Bilirubin, UA Negative Negative   Urobilinogen, Ur 2.0 (H) 0.2 - 1.0 mg/dL   Nitrite, UA  Negative Negative  Microscopic Examination See below:    Urinalysis Reflex Comment   Comprehensive metabolic panel  Result Value Ref Range   Glucose 140 (H) 65 - 99 mg/dL   BUN 9 8 - 27 mg/dL   Creatinine, Ser 0.85 0.57 - 1.00 mg/dL   GFR calc non Af Amer 67 >59 mL/min/1.73   GFR calc Af Amer 77 >59 mL/min/1.73   BUN/Creatinine Ratio 11 (L) 12 - 28   Sodium 140 134 - 144 mmol/L   Potassium 3.5 3.5 - 5.2 mmol/L   Chloride 94 (L) 96 - 106 mmol/L   CO2 26 20 - 29 mmol/L   Calcium 10.0 8.7 - 10.3 mg/dL   Total Protein 7.5 6.0 - 8.5 g/dL   Albumin 4.5 3.7 - 4.7 g/dL   Globulin, Total 3.0 1.5 - 4.5 g/dL   Albumin/Globulin Ratio 1.5 1.2 - 2.2   Bilirubin Total 0.4 0.0 - 1.2 mg/dL   Alkaline Phosphatase 86 39 - 117 IU/L   AST 23 0 - 40 IU/L   ALT 15 0 - 32 IU/L  TSH  Result Value Ref Range   TSH 1.810 0.450 - 4.500 uIU/mL      Assessment & Plan:   Problem List Items Addressed This Visit      Other   Anxiety    States that she has no motivation, affect and behavior significantly altered. Has been staring into space for hours- very different from her normal behavior and acute within the last month, a significant change. Labs normal. Neurology thought psychiatric. Unclear if this is what's going on. Will check in in 2 weeks. Keep appointment with local psychiatry, but referral to geri-psych made today as I'm concerned that she may need dual-diagnosis. Continue to monitor closely.       Relevant Orders   Ambulatory referral to Psychiatry   Depression    States that she has no motivation, affect and behavior significantly altered. Has been staring into space for hours- very different from her normal behavior and acute within the last month, a significant change. Labs normal. Neurology thought psychiatric. Unclear if this is what's going on. Will check in in 2 weeks. Keep appointment with local psychiatry, but referral to geri-psych made today as I'm concerned that she may need  dual-diagnosis. Continue to monitor closely.       Relevant Orders   Ambulatory referral to Psychiatry   Spinal stenosis of lumbar region    Wants refill on her tramadol. Continues to be significantly altered. Advised voltaren gel. Avoid any opiate at this time. Continue to monitor.       Memory impairment    Continue her aricept. Unclear if this is dementia, depression, or something else. Continue to follow with neurology. Will get her in with geri-psychiatry. Continue to monitor. Recheck 2 weeks.       Relevant Orders   Ambulatory referral to Psychiatry    Other Visit Diagnoses    Somnolence    -  Primary   No change- will try to get her into geri-psych as she likely needs dual diagnosis. Will see [facility] on 7/9. Await input.    Relevant Orders   Ambulatory referral to Psychiatry   Lethargy       No change- will try to get her into geri-psych as she likely needs dual diagnosis. Will see [facility] on 7/9. Await input.    Relevant Orders   Ambulatory referral to Psychiatry   Weight loss       Has lost  another 3 lbs in the last 3 weeks. States that the tremor seems to be really helping. OT is coming out. Ensure BID.       Follow up plan: Return in about 2 weeks (around 02/14/2019) for check in by phone/virtual visit.

## 2019-02-01 DIAGNOSIS — F322 Major depressive disorder, single episode, severe without psychotic features: Secondary | ICD-10-CM | POA: Diagnosis not present

## 2019-02-01 DIAGNOSIS — G459 Transient cerebral ischemic attack, unspecified: Secondary | ICD-10-CM | POA: Diagnosis not present

## 2019-02-01 DIAGNOSIS — G3184 Mild cognitive impairment, so stated: Secondary | ICD-10-CM | POA: Diagnosis not present

## 2019-02-01 DIAGNOSIS — R413 Other amnesia: Secondary | ICD-10-CM | POA: Diagnosis not present

## 2019-02-01 DIAGNOSIS — M5135 Other intervertebral disc degeneration, thoracolumbar region: Secondary | ICD-10-CM | POA: Diagnosis not present

## 2019-02-01 DIAGNOSIS — M48061 Spinal stenosis, lumbar region without neurogenic claudication: Secondary | ICD-10-CM | POA: Diagnosis not present

## 2019-02-01 NOTE — Chronic Care Management (AMB) (Signed)
  Chronic Care Management   Follow Up Note   02/01/2019 Name: Cynthia Vaughan MRN: [id number] DOB: Sep 17, 1940  Referred by: [name], DO Reason for referral : Chronic Care Management (Continued Lethargy ) and Care Coordination (Assist with Psych Referral)   KERRY ODONOHUE is a 78 y.o. year old female who is a primary care patient of [name], DO. The CCM team was consulted for assistance with chronic disease management and care coordination needs.    Review of patient status, including review of consultants reports, relevant laboratory and other test results, and collaboration with appropriate care team members and the patient's provider was performed as part of comprehensive patient evaluation and provision of chronic care management services.    Goals Addressed            This Visit's Progress   . I am just so sleepy and I don't know why (pt-stated)       Current Barriers:  [name] Knowledge Deficits related to recent symptom of increased letharygy   Nurse Case Manager Clinical Goal(s):  [name] Over the next 90 days, patient will work with [facility] to address needs related to incresed letharygy  Interventions:  . Provided education to patient re: on CCM and what resources CCM is availible to provide    . Met with patient and her spouse in exam room. Nash Dimmer with PCP about patients needs . 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 Provided CCM contact information . Spoke with patient's spouse about how patient was feeling and recent neurology appt.  [name] Spouse states patient continues to extremely lethargic . Spouse states Home Health has started and is helpful . Spouse states Neurology ordered Psych referral but he has not heard from them,  . Left a Vm with Psych referral coordinator Jonelle Sidle  . Psych Referral coordinator called and confirmed she was able to reach spouse and schedule appointment- July 9 with Dr. Thomasena Edis @ [facility].  [name]   Patient Self Care Activities:  . Currently UNABLE TO independently maintain activities of daily living realted to increased lethargy  Please see past updates related to this goal by clicking on the "Past Updates" button in the selected goal          The care management team will reach out to the patient again over the next 14 days.  The patient has been provided with contact information for the care management team and has been advised to call with any health related questions or concerns.   Merlene Morse Quanisha Drewry RN, BSN Nurse Case Editor, [facility]ing [facility]  [phone]) Business Mobile

## 2019-02-01 NOTE — Patient Instructions (Signed)
Thank you allowing the Chronic Care Management Team to be a part of your care! It was a pleasure speaking with you today!   CCM (Chronic Care Management) Team   Halbert Jesson RN, BSN Nurse Care Coordinator  [phone]  Catie Hazel [facility] PharmD  Clinical Pharmacist  [phone]  Eula Fried LCSW Clinical Social Worker [phone]  Goals Addressed            This Visit's Progress   . I am just so sleepy and I don't know why (pt-stated)       Current Barriers:  [name] Knowledge Deficits related to recent symptom of increased letharygy   Nurse Case Manager Clinical Goal(s):  [name] Over the next 90 days, patient will work with [facility] to address needs related to incresed letharygy  Interventions:  . Provided education to patient re: on CCM and what resources CCM is availible to provide    . Met with patient and her spouse in exam room. Nash Dimmer with PCP about patients needs . 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 Provided CCM contact information . Spoke with patient's spouse about how patient was feeling and recent neurology appt.  [name] Spouse states patient continues to extremely lethargic . Spouse states Home Health has started and is helpful . Spouse states Neurology ordered Psych referral but he has not heard from them,  . Left a Vm with Psych referral coordinator Jonelle Sidle  . Psych Referral coordinator called and confirmed she was able to reach spouse and schedule appointment- July 9 with Dr. Thomasena Edis @ [place].  [name]   Patient Self Care Activities:  . Currently UNABLE TO independently maintain activities of daily living realted to increased lethargy  Please see past updates related to this goal by clicking on the "Past Updates" button in the selected goal         The patient verbalized understanding of instructions provided today and declined a print copy of patient instruction materials.   The patient has been provided  with contact information for the care management team and has been advised to call with any health related questions or concerns.

## 2019-02-05 ENCOUNTER — Telehealth: Payer: Self-pay

## 2019-02-05 DIAGNOSIS — R413 Other amnesia: Secondary | ICD-10-CM | POA: Diagnosis not present

## 2019-02-05 DIAGNOSIS — F322 Major depressive disorder, single episode, severe without psychotic features: Secondary | ICD-10-CM | POA: Diagnosis not present

## 2019-02-05 DIAGNOSIS — M5135 Other intervertebral disc degeneration, thoracolumbar region: Secondary | ICD-10-CM | POA: Diagnosis not present

## 2019-02-05 DIAGNOSIS — M48061 Spinal stenosis, lumbar region without neurogenic claudication: Secondary | ICD-10-CM | POA: Diagnosis not present

## 2019-02-05 DIAGNOSIS — G459 Transient cerebral ischemic attack, unspecified: Secondary | ICD-10-CM | POA: Diagnosis not present

## 2019-02-05 DIAGNOSIS — G3184 Mild cognitive impairment, so stated: Secondary | ICD-10-CM | POA: Diagnosis not present

## 2019-02-07 DIAGNOSIS — M5135 Other intervertebral disc degeneration, thoracolumbar region: Secondary | ICD-10-CM | POA: Diagnosis not present

## 2019-02-07 DIAGNOSIS — G459 Transient cerebral ischemic attack, unspecified: Secondary | ICD-10-CM | POA: Diagnosis not present

## 2019-02-07 DIAGNOSIS — G3184 Mild cognitive impairment, so stated: Secondary | ICD-10-CM | POA: Diagnosis not present

## 2019-02-07 DIAGNOSIS — F322 Major depressive disorder, single episode, severe without psychotic features: Secondary | ICD-10-CM | POA: Diagnosis not present

## 2019-02-07 DIAGNOSIS — M48061 Spinal stenosis, lumbar region without neurogenic claudication: Secondary | ICD-10-CM | POA: Diagnosis not present

## 2019-02-07 DIAGNOSIS — R413 Other amnesia: Secondary | ICD-10-CM | POA: Diagnosis not present

## 2019-02-08 DIAGNOSIS — M5135 Other intervertebral disc degeneration, thoracolumbar region: Secondary | ICD-10-CM | POA: Diagnosis not present

## 2019-02-08 DIAGNOSIS — G3184 Mild cognitive impairment, so stated: Secondary | ICD-10-CM | POA: Diagnosis not present

## 2019-02-08 DIAGNOSIS — G459 Transient cerebral ischemic attack, unspecified: Secondary | ICD-10-CM | POA: Diagnosis not present

## 2019-02-08 DIAGNOSIS — F322 Major depressive disorder, single episode, severe without psychotic features: Secondary | ICD-10-CM | POA: Diagnosis not present

## 2019-02-08 DIAGNOSIS — M48061 Spinal stenosis, lumbar region without neurogenic claudication: Secondary | ICD-10-CM | POA: Diagnosis not present

## 2019-02-08 DIAGNOSIS — R413 Other amnesia: Secondary | ICD-10-CM | POA: Diagnosis not present

## 2019-02-11 DIAGNOSIS — M5135 Other intervertebral disc degeneration, thoracolumbar region: Secondary | ICD-10-CM | POA: Diagnosis not present

## 2019-02-11 DIAGNOSIS — G3184 Mild cognitive impairment, so stated: Secondary | ICD-10-CM | POA: Diagnosis not present

## 2019-02-11 DIAGNOSIS — R413 Other amnesia: Secondary | ICD-10-CM | POA: Diagnosis not present

## 2019-02-11 DIAGNOSIS — F322 Major depressive disorder, single episode, severe without psychotic features: Secondary | ICD-10-CM | POA: Diagnosis not present

## 2019-02-11 DIAGNOSIS — M48061 Spinal stenosis, lumbar region without neurogenic claudication: Secondary | ICD-10-CM | POA: Diagnosis not present

## 2019-02-11 DIAGNOSIS — G459 Transient cerebral ischemic attack, unspecified: Secondary | ICD-10-CM | POA: Diagnosis not present

## 2019-02-12 DIAGNOSIS — M5135 Other intervertebral disc degeneration, thoracolumbar region: Secondary | ICD-10-CM | POA: Diagnosis not present

## 2019-02-12 DIAGNOSIS — F322 Major depressive disorder, single episode, severe without psychotic features: Secondary | ICD-10-CM | POA: Diagnosis not present

## 2019-02-12 DIAGNOSIS — R413 Other amnesia: Secondary | ICD-10-CM | POA: Diagnosis not present

## 2019-02-12 DIAGNOSIS — G459 Transient cerebral ischemic attack, unspecified: Secondary | ICD-10-CM | POA: Diagnosis not present

## 2019-02-12 DIAGNOSIS — G3184 Mild cognitive impairment, so stated: Secondary | ICD-10-CM | POA: Diagnosis not present

## 2019-02-12 DIAGNOSIS — M48061 Spinal stenosis, lumbar region without neurogenic claudication: Secondary | ICD-10-CM | POA: Diagnosis not present

## 2019-02-13 ENCOUNTER — Telehealth: Payer: Self-pay

## 2019-02-13 DIAGNOSIS — R413 Other amnesia: Secondary | ICD-10-CM | POA: Diagnosis not present

## 2019-02-13 DIAGNOSIS — G459 Transient cerebral ischemic attack, unspecified: Secondary | ICD-10-CM | POA: Diagnosis not present

## 2019-02-13 DIAGNOSIS — G3184 Mild cognitive impairment, so stated: Secondary | ICD-10-CM | POA: Diagnosis not present

## 2019-02-13 DIAGNOSIS — F322 Major depressive disorder, single episode, severe without psychotic features: Secondary | ICD-10-CM | POA: Diagnosis not present

## 2019-02-13 DIAGNOSIS — M48061 Spinal stenosis, lumbar region without neurogenic claudication: Secondary | ICD-10-CM | POA: Diagnosis not present

## 2019-02-13 DIAGNOSIS — M5135 Other intervertebral disc degeneration, thoracolumbar region: Secondary | ICD-10-CM | POA: Diagnosis not present

## 2019-02-14 ENCOUNTER — Other Ambulatory Visit: Payer: Self-pay

## 2019-02-14 ENCOUNTER — Ambulatory Visit (INDEPENDENT_AMBULATORY_CARE_PROVIDER_SITE_OTHER): Payer: Medicare Other | Admitting: Family Medicine

## 2019-02-14 ENCOUNTER — Encounter: Payer: Self-pay | Admitting: Family Medicine

## 2019-02-14 DIAGNOSIS — F331 Major depressive disorder, recurrent, moderate: Secondary | ICD-10-CM

## 2019-02-14 NOTE — Progress Notes (Signed)
BP 126/79   Pulse 86   Ht 5' (1.524 m)   Wt 118 lb (53.5 kg)   LMP  (LMP Unknown)   BMI 23.05 kg/m    Subjective:    Patient ID: Cynthia Vaughan, female    DOB: 1941-04-28, 78 y.o.   MRN: [id number]  HPI: Cynthia Vaughan is a 78 y.o. female  Chief Complaint  Patient presents with  . Anxiety    2w f/u  . Depression   Has been drinking her ensure- has only lost 1 lb in the last 2 weeks, starting to eat a little bit better. Her husband notes that she is still pretty much the same. She is still staring into space and not speaking for hours at a time. She doesn't feel ike her medicine is helping. She notes that she just doesn't want to do anything. She is due to see psychiatry next week. She notes that her tremor seems to be about the same. She has been getting home health- nurse discharged her yesterday.  Still getting PT and the OT. She is otherwise doing OK with no other concerns or complaints at this time.   Relevant past medical, surgical, family and social history reviewed and updated as indicated. Interim medical history since our last visit reviewed. Allergies and medications reviewed and updated.  Review of Systems  Per HPI unless specifically indicated above     Objective:    BP 126/79   Pulse 86   Ht 5' (1.524 m)   Wt 118 lb (53.5 kg)   LMP  (LMP Unknown)   BMI 23.05 kg/m   Wt Readings from Last 3 Encounters:  02/14/19 118 lb (53.5 kg)  01/31/19 119 lb (54 kg)  01/22/19 122 lb (55.3 kg)    Physical Exam Vitals signs and nursing note reviewed.  Constitutional:      General: She is not in acute distress.    Appearance: Normal appearance. She is not ill-appearing, toxic-appearing or diaphoretic.  HENT:     Head: Normocephalic and atraumatic.     Right Ear: External ear normal.     Left Ear: External ear normal.     Nose: Nose normal.     Mouth/Throat:     Mouth: Mucous membranes are moist.     Pharynx: Oropharynx is clear.  Eyes:     General: No  scleral icterus.       Right eye: No discharge.        Left eye: No discharge.     Conjunctiva/sclera: Conjunctivae normal.     Pupils: Pupils are equal, round, and reactive to light.  Neck:     Musculoskeletal: Normal range of motion.  Pulmonary:     Effort: Pulmonary effort is normal. No respiratory distress.     Comments: Speaking in full sentences Musculoskeletal: Normal range of motion.  Skin:    Coloration: Skin is not jaundiced or pale.     Findings: No bruising, erythema, lesion or rash.  Neurological:     Mental Status: She is alert and oriented to person, place, and time. Mental status is at baseline.  Psychiatric:        Mood and Affect: Mood normal. Affect is blunt and flat.        Behavior: Behavior normal.        Thought Content: Thought content normal.        Judgment: Judgment normal.     Results for orders placed or performed in visit on  01/22/19  Microscopic Examination   URINE  Result Value Ref Range   WBC, UA 0-5 0 - 5 /hpf   RBC 0-2 0 - 2 /hpf   Epithelial Cells (non renal) 0-10 0 - 10 /hpf   Bacteria, UA Few (A) None seen/Few  Urine Culture, Reflex   URINE  Result Value Ref Range   Urine Culture, Routine Final report    Organism ID, Bacteria Comment   CBC with Differential/Platelet  Result Value Ref Range   WBC 10.3 3.4 - 10.8 x10E3/uL   RBC 4.82 3.77 - 5.28 x10E6/uL   Hemoglobin 15.6 11.1 - 15.9 g/dL   Hematocrit 44.9 34.0 - 46.6 %   MCV 93 79 - 97 fL   MCH 32.4 26.6 - 33.0 pg   MCHC 34.7 31.5 - 35.7 g/dL   RDW 12.3 11.7 - 15.4 %   Platelets 245 150 - 450 x10E3/uL   Neutrophils 77 Not Estab. %   Lymphs 17 Not Estab. %   Monocytes 6 Not Estab. %   Eos 0 Not Estab. %   Basos 0 Not Estab. %   Neutrophils Absolute 7.8 (H) 1.4 - 7.0 x10E3/uL   Lymphocytes Absolute 1.8 0.7 - 3.1 x10E3/uL   Monocytes Absolute 0.6 0.1 - 0.9 x10E3/uL   EOS (ABSOLUTE) 0.0 0.0 - 0.4 x10E3/uL   Basophils Absolute 0.0 0.0 - 0.2 x10E3/uL   Immature Granulocytes 0 Not  Estab. %   Immature Grans (Abs) 0.0 0.0 - 0.1 x10E3/uL  UA/M w/rflx Culture, Routine   Specimen: Urine   URINE  Result Value Ref Range   Specific Gravity, UA 1.015 1.005 - 1.030   pH, UA 6.5 5.0 - 7.5   Color, UA Yellow Yellow   Appearance Ur Clear Clear   Leukocytes,UA Negative Negative   Protein,UA 1+ (A) Negative/Trace   Glucose, UA Negative Negative   Ketones, UA Negative Negative   RBC, UA Trace (A) Negative   Bilirubin, UA Negative Negative   Urobilinogen, Ur 2.0 (H) 0.2 - 1.0 mg/dL   Nitrite, UA Negative Negative   Microscopic Examination See below:    Urinalysis Reflex Comment   Comprehensive metabolic panel  Result Value Ref Range   Glucose 140 (H) 65 - 99 mg/dL   BUN 9 8 - 27 mg/dL   Creatinine, Ser 0.85 0.57 - 1.00 mg/dL   GFR calc non Af Amer 67 >59 mL/min/1.73   GFR calc Af Amer 77 >59 mL/min/1.73   BUN/Creatinine Ratio 11 (L) 12 - 28   Sodium 140 134 - 144 mmol/L   Potassium 3.5 3.5 - 5.2 mmol/L   Chloride 94 (L) 96 - 106 mmol/L   CO2 26 20 - 29 mmol/L   Calcium 10.0 8.7 - 10.3 mg/dL   Total Protein 7.5 6.0 - 8.5 g/dL   Albumin 4.5 3.7 - 4.7 g/dL   Globulin, Total 3.0 1.5 - 4.5 g/dL   Albumin/Globulin Ratio 1.5 1.2 - 2.2   Bilirubin Total 0.4 0.0 - 1.2 mg/dL   Alkaline Phosphatase 86 39 - 117 IU/L   AST 23 0 - 40 IU/L   ALT 15 0 - 32 IU/L  TSH  Result Value Ref Range   TSH 1.810 0.450 - 4.500 uIU/mL      Assessment & Plan:   Problem List Items Addressed This Visit      Other   Depression    No change. Due to see psychiatry next week. Weight loss has slowed down. Continue PT and OT. Continue  to monitor. Call with any concerns.           Follow up plan: Return if symptoms worsen or fail to improve.   . This visit was completed via Doximity due to the restrictions of the COVID-19 pandemic. All issues as above were discussed and addressed. Physical exam was done as above through visual confirmation on Doximity. If it was felt that the patient  should be evaluated in the office, they were directed there. The patient verbally consented to this visit. . Location of the patient: home . Location of the provider: work . Those involved with this call:  . Provider: Park Liter, DO . CMA: Lesle Chris, [place] . Front Desk/Registration: Don Perking  . Time spent on call: 15 minutes with patient face to face via video conference. More than 50% of this time was spent in counseling and coordination of care. 23 minutes total spent in review of patient's record and preparation of their chart.

## 2019-02-18 ENCOUNTER — Ambulatory Visit (INDEPENDENT_AMBULATORY_CARE_PROVIDER_SITE_OTHER): Payer: Medicare Other | Admitting: Psychiatry

## 2019-02-18 ENCOUNTER — Encounter: Payer: Self-pay | Admitting: Psychiatry

## 2019-02-18 ENCOUNTER — Other Ambulatory Visit: Payer: Self-pay

## 2019-02-18 ENCOUNTER — Encounter: Payer: Self-pay | Admitting: Family Medicine

## 2019-02-18 DIAGNOSIS — M48061 Spinal stenosis, lumbar region without neurogenic claudication: Secondary | ICD-10-CM | POA: Diagnosis not present

## 2019-02-18 DIAGNOSIS — G3184 Mild cognitive impairment, so stated: Secondary | ICD-10-CM | POA: Diagnosis not present

## 2019-02-18 DIAGNOSIS — F09 Unspecified mental disorder due to known physiological condition: Secondary | ICD-10-CM | POA: Diagnosis not present

## 2019-02-18 DIAGNOSIS — G459 Transient cerebral ischemic attack, unspecified: Secondary | ICD-10-CM | POA: Diagnosis not present

## 2019-02-18 DIAGNOSIS — R453 Demoralization and apathy: Secondary | ICD-10-CM | POA: Diagnosis not present

## 2019-02-18 DIAGNOSIS — F331 Major depressive disorder, recurrent, moderate: Secondary | ICD-10-CM | POA: Diagnosis not present

## 2019-02-18 DIAGNOSIS — R413 Other amnesia: Secondary | ICD-10-CM | POA: Diagnosis not present

## 2019-02-18 DIAGNOSIS — M5135 Other intervertebral disc degeneration, thoracolumbar region: Secondary | ICD-10-CM | POA: Diagnosis not present

## 2019-02-18 DIAGNOSIS — F322 Major depressive disorder, single episode, severe without psychotic features: Secondary | ICD-10-CM | POA: Diagnosis not present

## 2019-02-18 MED ORDER — DULOXETINE HCL 30 MG PO CPEP: 30.0000 mg | ORAL_CAPSULE | Freq: Every day | ORAL | 0 refills | Status: DC

## 2019-02-18 MED ORDER — BUPROPION HCL 75 MG PO TABS: 75.0000 mg | ORAL_TABLET | Freq: Every day | ORAL | 0 refills | Status: DC

## 2019-02-18 NOTE — Progress Notes (Signed)
Virtual Visit via Video Note  I connected with Cynthia Vaughan on 02/18/19 at  1:00 PM EDT by a video enabled telemedicine application and verified that I am speaking with the correct person using two identifiers.   I discussed the limitations of evaluation and management by telemedicine and the availability of in person appointments. The patient expressed understanding and agreed to proceed.  I discussed the assessment and treatment plan with the patient. The patient was provided an opportunity to ask questions and all were answered. The patient agreed with the plan and demonstrated an understanding of the instructions.   The patient was advised to call back or seek an in-person evaluation if the symptoms worsen or if the condition fails to improve as anticipated.   Psychiatric Initial Adult Assessment   Patient Identification: Cynthia Vaughan MRN:  [id number] Date of Evaluation:  02/18/2019 Referral Source: Chipper Herb NP Chief Complaint:   Chief Complaint    Establish Care; Anxiety; Depression     Visit Diagnosis:    ICD-10-CM   1. MDD (major depressive disorder), recurrent episode, moderate (HCC)  F33.1 DULoxetine (CYMBALTA) 30 MG capsule    buPROPion (WELLBUTRIN) 75 MG tablet  2. Mild cognitive disorder  F09   3. Apathy  R45.3     History of Present Illness:  Cynthia Vaughan is a 78 year old Caucasian female, married, lives in [place], has a history of cognitive disorder, hypertension, TIA, GERD, osteoarthritis, spinal scoliosis, depression, was evaluated by telemedicine today.  Patient being a limited historian her husband Mr. Cynthia Vaughan provided collateral information.  Patient appeared to be alert, oriented to person place and situation.  She was able to answer questions appropriately however answered questions mostly in short phrases.  Patient appeared to have tremors around her mouth and did not have good eye contact during the session.  Patient reports she is currently having  some depressive symptoms.  She reports she struggles with tiredness during the day.  According to her husband she sleeps during the day and also at night and does not have any motivation to do anything anymore.  She also does not have any interest in the things that she enjoyed previously.  She stays home mostly.  She does have OT and PT that works with her twice a week.  She also has home health 4 times a week.  Patient is currently taking Cymbalta, Abilify, likely since the past 1 year.  However her symptoms continues to worsen and these medication does not seem to help.  Patient denies any history of trauma.  Patient denies any significant anxiety symptoms.  Patient denies any suicidality, homicidality or perceptual disturbances.  Patient denies any history of manic or hypomanic episodes.  Patient denies any substance abuse problems.  Patient has 2 living children as well as several grandchildren.  She has good relationship with them.  She has good support system from her husband.  Associated Signs/Symptoms: Depression Symptoms:  depressed mood, anhedonia, hypersomnia, psychomotor retardation, (Hypo) Manic Symptoms:  denies Anxiety Symptoms:  denies Psychotic Symptoms:  denies PTSD Symptoms: Negative  Past Psychiatric History: Denies inpatient mental health admissions.  Patient denies any suicide attempts.  Patient has a history of being treated with Cymbalta, Abilify-currently on it however does not think it helps.  Previous Psychotropic Medications: Yes Cymbalta, Abilify  Substance Abuse History in the last 12 months:  No.  Consequences of Substance Abuse: Negative  Past Medical History:  Past Medical History:  Diagnosis Date  . Allergy   .  Anxiety   . Cancer ([place] [place])    SKIN  . CKD (chronic kidney disease) stage 2, GFR 60-89 ml/min   . Depression   . Edema    LEGS/FEET  . GERD (gastroesophageal reflux disease)   . Heart murmur   . Hyperlipidemia   . Hypertension    . Hypopotassemia   . Microalbuminuria   . Osteoarthritis   . Osteopenia   . [facility] spotted fever) 05/10/2016  . Urinary incontinence   . Vitamin D deficiency disease   . Wheezing     Past Surgical History:  Procedure Laterality Date  . ABDOMINAL HYSTERECTOMY  2004   Total (due to bladder surgery)  . BLADDER SURGERY     x 2  . BUNIONECTOMY    . CATARACT EXTRACTION W/PHACO Right 11/23/2015   Procedure: CATARACT EXTRACTION PHACO AND INTRAOCULAR LENS PLACEMENT (IOC);  Surgeon: Estill Cotta, MD;  Location: [facility] ORS;  Service: Ophthalmology;  Laterality: Right;  [place]    1:30.9 AP%  26.9 CDE   42.29 flud casette lot # [id number]  exp05/31/2018  . COLONOSCOPY      Family Psychiatric History: Patient denies history of mental health problems in her family.  However brother and sister does have dementia.  Family History:  Family History  Problem Relation Age of Onset  . AAA (abdominal aortic aneurysm) Mother   . Arthritis Brother   . Heart disease Brother   . Stroke Daughter   . Diabetes Son   . Stroke Son   . Seizures Son   . Diabetes Brother   . Heart disease Brother        7 total heart attacks  . Hyperlipidemia Brother   . Hypertension Brother   . Dementia Brother   . AAA (abdominal aortic aneurysm) Brother   . Stroke Son   . Heart disease Brother   . Dementia Sister     Social History:   Social History   Socioeconomic History  . Marital status: Married    Spouse name: hal  . Number of children: 3  . Years of education: Not on file  . Highest education level: GED or equivalent  Occupational History  . Not on file  Social Needs  . Financial resource strain: Not hard at all  . Food insecurity    Worry: Never true    Inability: Never true  . Transportation needs    Medical: Patient refused    Non-medical: Patient refused  Tobacco Use  . Smoking status: Former Research scientist (life sciences)  . Smokeless tobacco: Never Used  . Tobacco comment: quit 20 years ago    Substance and Sexual Activity  . Alcohol use: Yes    Comment: OCCAS  . Drug use: No  . Sexual activity: Never  Lifestyle  . Physical activity    Days per week: 0 days    Minutes per session: 0 min  . Stress: To some extent  Relationships  . Social Herbalist on phone: Patient refused    Gets together: Patient refused    Attends religious service: More than 4 times per year    Active member of club or organization: Yes    Attends meetings of clubs or organizations: More than 4 times per year    Relationship status: Married  Other Topics Concern  . Not on file  Social History Narrative  . Not on file    Additional Social History: Patient was married twice.  She was divorced once.  She  has been living with her current husband since the past 28 years.  She has 1 child-deceased and 2 living children both adults-her son and daughter.  She has several grandchildren.  She has good support system from the mall.  She currently lives in [place].  She went up to 10th grade.  She is currently retired.  Allergies:   Allergies  Allergen Reactions  . Codeine Itching  . Lisinopril Cough    Metabolic Disorder Labs: Lab Results  Component Value Date   HGBA1C 5.5 12/19/2017   MPG 111.15 12/19/2017   No results found for: PROLACTIN Lab Results  Component Value Date   CHOL 180 08/31/2018   TRIG 90 08/31/2018   HDL 73 08/31/2018   CHOLHDL 2.8 12/19/2017   VLDL 12 12/19/2017   LDLCALC 89 08/31/2018   LDLCALC 81 06/01/2018   Lab Results  Component Value Date   TSH 1.810 01/22/2019    Therapeutic Level Labs: No results found for: LITHIUM No results found for: CBMZ No results found for: VALPROATE  Current Medications: Current Outpatient Medications  Medication Sig Dispense Refill  . acetaminophen (TYLENOL) 500 MG tablet Take 500 mg by mouth every 6 (six) hours as needed.    [name] amLODipine (NORVASC) 10 MG tablet Take 1 tablet (10 mg total) by mouth daily. 90 tablet 3   . aspirin EC 81 MG tablet Take 81 mg by mouth daily.    [name] atorvastatin (LIPITOR) 40 MG tablet Take 1 tablet (40 mg total) by mouth at bedtime. 90 tablet 3  . diclofenac sodium (VOLTAREN) 1 % GEL Apply 4 g topically 4 (four) times daily. 100 g 3  . donepezil (ARICEPT) 10 MG tablet Take 1 tablet (10 mg total) by mouth at bedtime. 90 tablet 1  . fluticasone (FLONASE) 50 MCG/ACT nasal spray Place 2 sprays into both nostrils daily. 48 g 3  . gabapentin (NEURONTIN) 100 MG capsule Take 1 capsule (100 mg total) by mouth 3 (three) times daily. 270 capsule 0  . loratadine (CLARITIN) 10 MG tablet Take 1 tablet (10 mg total) by mouth daily. 90 tablet 3  . losartan (COZAAR) 50 MG tablet Take 1 tablet (50 mg total) by mouth daily. 90 tablet 3  . omeprazole (PRILOSEC) 20 MG capsule Take 1 capsule (20 mg total) by mouth daily. 90 capsule 0  . Vitamin D, Ergocalciferol, (DRISDOL) 50000 units CAPS capsule Take 1 capsule (50,000 Units total) by mouth every 7 (seven) days. 12 capsule 0  . buPROPion (WELLBUTRIN) 75 MG tablet Take 1 tablet (75 mg total) by mouth daily with breakfast. 90 tablet 0  . DULoxetine (CYMBALTA) 30 MG capsule Take 1 capsule (30 mg total) by mouth daily. 90 capsule 0   No current facility-administered medications for this visit.     Musculoskeletal: Strength & Muscle Tone: UTA Gait & Station: Reports that she can walk short distances without support Patient leans: N/A  Psychiatric Specialty Exam: Review of Systems  Psychiatric/Behavioral: Positive for depression.  All other systems reviewed and are negative.   There were no vitals taken for this visit.There is no height or weight on file to calculate BMI.  General Appearance: Casual  Eye Contact:  limited  Speech:  Slow  Volume:  Decreased  Mood:  Dysphoric  Affect:  Flat  Thought Process:  Linear and Descriptions of Associations: Intact  Orientation:  Full (Time, Place, and Person)  Thought Content:  Logical  Suicidal  Thoughts:  No  Homicidal Thoughts:  No  Memory:  Immediate;   Fair Recent;   Fair Remote;   Fair  Judgement:  Fair  Insight:  Fair  Psychomotor Activity:  Tremor hands and mouth  Concentration:  Concentration: Fair and Attention Span: Fair  Recall:  [employer] of Knowledge:Fair  Language: Fair  Akathisia:  No  Handed:  Right  AIMS (if indicated): Denies tremors, rigidity, stiffness  Assets:  Communication Skills Desire for [place] Talents/Skills  ADL's:  Intact  Cognition: Impaired,  Mild  Sleep:  excessive   Screenings: GAD-7     Office Visit from 02/14/2019 in [facility] Visit from 08/31/2018 in [facility] Visit from 04/03/2018 in [place] Visit from 02/27/2018 in [facility] Visit from 11/09/2017 in [facility]  Total GAD-7 Score  7  3  10  11  4     Mini-Mental     Office Visit from 12/04/2018 in [facility] Visit from 12/18/2017 in [facility]  Total Score (max 30 points )  22  24    PHQ2-9     Office Visit from 02/14/2019 in [place] Visit from 12/04/2018 in [facility] Visit from 08/31/2018 in [facility] Visit from 06/01/2018 in [facility] Visit from 04/03/2018 in [place]  PHQ-2 Total Score  4  1  1  1  1   PHQ-9 Total Score  12  7  3  4  8       Assessment and Plan: Ireta is a 78 year old Caucasian female, married, retired, lives in [place], has a history of depression, hypertension, TIA, spinal stenosis, cognitive disorder was evaluated by telemedicine today.  Patient is biologically predisposed given her chronic health problems.  She also has psychosocial stressors of physical limitations due to her chronic medical problems.  She has good social support system.  She denies suicidality or perceptual disturbances.  She denies  substance abuse problems.  Discussed plan as noted below.  Plan MDD recurrent-unstable Reduce Cymbalta to 30 mg p.o. daily. Add Wellbutrin 75 mg p.o. daily with breakfast Discontinue Abilify for lack of effect.   For cognitive disorder-mild-she will continue to work with her neurologist. I have reviewed medical records in [facility] per Caryl Pina step-NP-dated 01/25/2019-' patient with memory problems.  She continues to have difficulty with following conversations.  She takes Aricept 10 mg.  She sleeps well at night but also feels sleepy during the day.  Tremors- patient has tremors located in her hands and arms.  Her husband reports that her hands and arms shake so bad that she cannot drink her coffee.  Continue Aricept 10 mg, gabapentin 100 mg.' MMSE -07/11/2018-22 out of 30  Apathy- likely due to depression versus cognitive issues.  We will continue to monitor closely. Reviewed TSH in [facility] dated 10/11/2018-within normal limits.  Discussed referral for CBT- patient as well as husband is not currently interested.  Follow-up in clinic in 4 weeks or sooner if needed.  August 10 at 4:15 PM  I have spent atleast 60 minutes non face to face with patient today. More than 50 % of the time was spent for psychoeducation and supportive psychotherapy and care coordination.  This note was generated in part or whole with voice recognition software. Voice recognition is usually quite accurate but there are transcription errors that can and very often do occur. I apologize for any typographical errors that were not detected  and corrected.     Ursula Alert, MD 6/29/20201:46 PM

## 2019-02-18 NOTE — Assessment & Plan Note (Signed)
No change. Due to see psychiatry next week. Weight loss has slowed down. Continue PT and OT. Continue to monitor. Call with any concerns.

## 2019-02-20 DIAGNOSIS — R413 Other amnesia: Secondary | ICD-10-CM | POA: Diagnosis not present

## 2019-02-20 DIAGNOSIS — F322 Major depressive disorder, single episode, severe without psychotic features: Secondary | ICD-10-CM | POA: Diagnosis not present

## 2019-02-20 DIAGNOSIS — G3184 Mild cognitive impairment, so stated: Secondary | ICD-10-CM | POA: Diagnosis not present

## 2019-02-20 DIAGNOSIS — M5135 Other intervertebral disc degeneration, thoracolumbar region: Secondary | ICD-10-CM | POA: Diagnosis not present

## 2019-02-20 DIAGNOSIS — M48061 Spinal stenosis, lumbar region without neurogenic claudication: Secondary | ICD-10-CM | POA: Diagnosis not present

## 2019-02-20 DIAGNOSIS — G459 Transient cerebral ischemic attack, unspecified: Secondary | ICD-10-CM | POA: Diagnosis not present

## 2019-02-21 ENCOUNTER — Telehealth: Payer: Self-pay

## 2019-02-21 DIAGNOSIS — R413 Other amnesia: Secondary | ICD-10-CM | POA: Diagnosis not present

## 2019-02-21 DIAGNOSIS — G3184 Mild cognitive impairment, so stated: Secondary | ICD-10-CM | POA: Diagnosis not present

## 2019-02-21 DIAGNOSIS — G459 Transient cerebral ischemic attack, unspecified: Secondary | ICD-10-CM | POA: Diagnosis not present

## 2019-02-21 DIAGNOSIS — M5135 Other intervertebral disc degeneration, thoracolumbar region: Secondary | ICD-10-CM | POA: Diagnosis not present

## 2019-02-21 DIAGNOSIS — F322 Major depressive disorder, single episode, severe without psychotic features: Secondary | ICD-10-CM | POA: Diagnosis not present

## 2019-02-21 DIAGNOSIS — M48061 Spinal stenosis, lumbar region without neurogenic claudication: Secondary | ICD-10-CM | POA: Diagnosis not present

## 2019-02-21 NOTE — Telephone Encounter (Signed)
the [facility] has a question about the duloxetine they need you to call them  [phone] option #3

## 2019-02-21 NOTE — Telephone Encounter (Signed)
Spoke to [facility] pharmacist- clarified duloxetine dosage.

## 2019-02-25 DIAGNOSIS — F322 Major depressive disorder, single episode, severe without psychotic features: Secondary | ICD-10-CM | POA: Diagnosis not present

## 2019-02-25 DIAGNOSIS — R5383 Other fatigue: Secondary | ICD-10-CM | POA: Diagnosis not present

## 2019-02-25 DIAGNOSIS — G3184 Mild cognitive impairment, so stated: Secondary | ICD-10-CM | POA: Diagnosis not present

## 2019-02-25 DIAGNOSIS — N182 Chronic kidney disease, stage 2 (mild): Secondary | ICD-10-CM | POA: Diagnosis not present

## 2019-02-25 DIAGNOSIS — G459 Transient cerebral ischemic attack, unspecified: Secondary | ICD-10-CM | POA: Diagnosis not present

## 2019-02-25 DIAGNOSIS — Z7982 Long term (current) use of aspirin: Secondary | ICD-10-CM | POA: Diagnosis not present

## 2019-02-25 DIAGNOSIS — M5135 Other intervertebral disc degeneration, thoracolumbar region: Secondary | ICD-10-CM | POA: Diagnosis not present

## 2019-02-25 DIAGNOSIS — R2689 Other abnormalities of gait and mobility: Secondary | ICD-10-CM | POA: Diagnosis not present

## 2019-02-25 DIAGNOSIS — F419 Anxiety disorder, unspecified: Secondary | ICD-10-CM | POA: Diagnosis not present

## 2019-02-25 DIAGNOSIS — M1990 Unspecified osteoarthritis, unspecified site: Secondary | ICD-10-CM | POA: Diagnosis not present

## 2019-02-25 DIAGNOSIS — M48061 Spinal stenosis, lumbar region without neurogenic claudication: Secondary | ICD-10-CM | POA: Diagnosis not present

## 2019-02-25 DIAGNOSIS — R531 Weakness: Secondary | ICD-10-CM | POA: Diagnosis not present

## 2019-02-25 DIAGNOSIS — R413 Other amnesia: Secondary | ICD-10-CM | POA: Diagnosis not present

## 2019-02-26 DIAGNOSIS — R413 Other amnesia: Secondary | ICD-10-CM | POA: Diagnosis not present

## 2019-02-26 DIAGNOSIS — M5135 Other intervertebral disc degeneration, thoracolumbar region: Secondary | ICD-10-CM | POA: Diagnosis not present

## 2019-02-26 DIAGNOSIS — F322 Major depressive disorder, single episode, severe without psychotic features: Secondary | ICD-10-CM | POA: Diagnosis not present

## 2019-02-26 DIAGNOSIS — G459 Transient cerebral ischemic attack, unspecified: Secondary | ICD-10-CM | POA: Diagnosis not present

## 2019-02-26 DIAGNOSIS — M48061 Spinal stenosis, lumbar region without neurogenic claudication: Secondary | ICD-10-CM | POA: Diagnosis not present

## 2019-02-26 DIAGNOSIS — G3184 Mild cognitive impairment, so stated: Secondary | ICD-10-CM | POA: Diagnosis not present

## 2019-02-28 ENCOUNTER — Ambulatory Visit: Payer: Medicare Other | Admitting: Psychiatry

## 2019-03-04 DIAGNOSIS — M48061 Spinal stenosis, lumbar region without neurogenic claudication: Secondary | ICD-10-CM | POA: Diagnosis not present

## 2019-03-04 DIAGNOSIS — G459 Transient cerebral ischemic attack, unspecified: Secondary | ICD-10-CM | POA: Diagnosis not present

## 2019-03-04 DIAGNOSIS — F322 Major depressive disorder, single episode, severe without psychotic features: Secondary | ICD-10-CM | POA: Diagnosis not present

## 2019-03-04 DIAGNOSIS — M5135 Other intervertebral disc degeneration, thoracolumbar region: Secondary | ICD-10-CM | POA: Diagnosis not present

## 2019-03-04 DIAGNOSIS — R413 Other amnesia: Secondary | ICD-10-CM | POA: Diagnosis not present

## 2019-03-04 DIAGNOSIS — G3184 Mild cognitive impairment, so stated: Secondary | ICD-10-CM | POA: Diagnosis not present

## 2019-03-06 ENCOUNTER — Telehealth: Payer: Self-pay

## 2019-03-06 DIAGNOSIS — F322 Major depressive disorder, single episode, severe without psychotic features: Secondary | ICD-10-CM | POA: Diagnosis not present

## 2019-03-06 DIAGNOSIS — G459 Transient cerebral ischemic attack, unspecified: Secondary | ICD-10-CM | POA: Diagnosis not present

## 2019-03-06 DIAGNOSIS — R413 Other amnesia: Secondary | ICD-10-CM | POA: Diagnosis not present

## 2019-03-06 DIAGNOSIS — M5135 Other intervertebral disc degeneration, thoracolumbar region: Secondary | ICD-10-CM | POA: Diagnosis not present

## 2019-03-06 DIAGNOSIS — M48061 Spinal stenosis, lumbar region without neurogenic claudication: Secondary | ICD-10-CM | POA: Diagnosis not present

## 2019-03-06 DIAGNOSIS — G3184 Mild cognitive impairment, so stated: Secondary | ICD-10-CM | POA: Diagnosis not present

## 2019-03-08 ENCOUNTER — Encounter: Payer: Self-pay | Admitting: Family Medicine

## 2019-03-08 ENCOUNTER — Other Ambulatory Visit: Payer: Self-pay

## 2019-03-08 ENCOUNTER — Ambulatory Visit (INDEPENDENT_AMBULATORY_CARE_PROVIDER_SITE_OTHER): Payer: Medicare Other | Admitting: Family Medicine

## 2019-03-08 VITALS — BP 108/73 | HR 80 | Temp 98.4°F | Wt 115.4 lb

## 2019-03-08 DIAGNOSIS — F331 Major depressive disorder, recurrent, moderate: Secondary | ICD-10-CM

## 2019-03-08 DIAGNOSIS — I129 Hypertensive chronic kidney disease with stage 1 through stage 4 chronic kidney disease, or unspecified chronic kidney disease: Secondary | ICD-10-CM

## 2019-03-08 DIAGNOSIS — R634 Abnormal weight loss: Secondary | ICD-10-CM

## 2019-03-08 NOTE — Progress Notes (Signed)
BP 108/73   Pulse 80   Temp 98.4 F (36.9 C) (Oral)   Wt 115 lb 6.4 oz (52.3 kg)   LMP  (LMP Unknown)   SpO2 96%   BMI 22.54 kg/m    Subjective:    Patient ID: Cynthia Vaughan, female    DOB: 16-Jul-1941, 78 y.o.   MRN: [id number]  HPI: Cynthia Vaughan is a 78 y.o. female  Chief Complaint  Patient presents with  . Depression  . Hypertension   Saw psychiatry on 02/18/19- they are adjusting her medicine, reducing her cymbalta, adding wellbutrin and discontinuing her abilify. She states that she is feeling better. Her husband notes that everything is still the same. She still can't eat because of the tremor- continuing to lose weight. Continues to start into space and not doing her regular activities.   Due to see Dr. Melrose Nakayama on 03/11/19.  DEPRESSION Mood status: stable Satisfied with current treatment?: yes Symptom severity: severe  Duration of current treatment : being adjusted with pychiatry Side effects: no Medication compliance: good compliance Psychotherapy/counseling: no  Depressed mood: yes Anxious mood: yes Anhedonia: yes Significant weight loss or gain: yes Insomnia: yes hard to fall asleep Fatigue: yes Feelings of worthlessness or guilt: no Impaired concentration/indecisiveness: no Suicidal ideations: no Hopelessness: no Crying spells: no Depression screen [facility] 2/9 03/08/2019 02/14/2019 12/04/2018 08/31/2018 06/01/2018  Decreased Interest 0 3 0 1 0  Down, Depressed, Hopeless 0 1 1 0 1  PHQ - 2 Score 0 4 1 1 1   Altered sleeping 3 2 0 0 1  Tired, decreased energy 1 2 2 1 1   Change in appetite 1 2 1 1  0  Feeling bad or failure about yourself  0 1 1 0 0  Trouble concentrating 0 1 2 0 1  Moving slowly or fidgety/restless 0 0 0 0 0  Suicidal thoughts 0 0 0 0 0  PHQ-9 Score 5 12 7 3 4   Difficult doing work/chores Not difficult at all Extremely dIfficult Somewhat difficult Somewhat difficult -  Some recent data might be hidden   GAD 7 : Generalized Anxiety Score  03/08/2019 02/14/2019 08/31/2018 06/01/2018  Nervous, Anxious, on Edge 1 2 2 1   Control/stop worrying 0 1 1 -  Worry too much - different things 0 1 0 1  Trouble relaxing 0 2 0 1  Restless 0 1 0 1  Easily annoyed or irritable 0 0 0 0  Afraid - awful might happen 0 0 0 1  Total GAD 7 Score 1 7 3  -  Anxiety Difficulty Not difficult at all - Not difficult at all -   HYPERTENSION Hypertension status: stable  Satisfied with current treatment? yes Duration of hypertension: chronic BP monitoring frequency:  not checking BP medication side effects:  no Medication compliance: good compliance Aspirin: yes Recurrent headaches: no Visual changes: no Palpitations: no Dyspnea: no Chest pain: no Lower extremity edema: no Dizzy/lightheaded: no  Relevant past medical, surgical, family and social history reviewed and updated as indicated. Interim medical history since our last visit reviewed. Allergies and medications reviewed and updated.  Review of Systems  Constitutional: Negative.   Respiratory: Negative.   Cardiovascular: Negative.   Musculoskeletal: Negative.   Skin: Negative.   Neurological: Positive for tremors. Negative for dizziness, seizures, syncope, facial asymmetry, speech difficulty, weakness, light-headedness, numbness and headaches.  Psychiatric/Behavioral: Positive for dysphoric mood and sleep disturbance. Negative for agitation, behavioral problems, confusion, decreased concentration, hallucinations, self-injury and suicidal ideas. The patient is  not nervous/anxious and is not hyperactive.     Per HPI unless specifically indicated above     Objective:    BP 108/73   Pulse 80   Temp 98.4 F (36.9 C) (Oral)   Wt 115 lb 6.4 oz (52.3 kg)   LMP  (LMP Unknown)   SpO2 96%   BMI 22.54 kg/m   Wt Readings from Last 3 Encounters:  03/08/19 115 lb 6.4 oz (52.3 kg)  02/14/19 118 lb (53.5 kg)  01/31/19 119 lb (54 kg)    Physical Exam Vitals signs and nursing note reviewed.   Constitutional:      General: She is not in acute distress.    Appearance: Normal appearance. She is not ill-appearing, toxic-appearing or diaphoretic.  HENT:     Head: Normocephalic and atraumatic.     Right Ear: External ear normal.     Left Ear: External ear normal.     Nose: Nose normal.     Mouth/Throat:     Mouth: Mucous membranes are moist.     Pharynx: Oropharynx is clear.  Eyes:     General: No scleral icterus.       Right eye: No discharge.        Left eye: No discharge.     Extraocular Movements: Extraocular movements intact.     Conjunctiva/sclera: Conjunctivae normal.     Pupils: Pupils are equal, round, and reactive to light.  Neck:     Musculoskeletal: Normal range of motion and neck supple.  Cardiovascular:     Rate and Rhythm: Normal rate and regular rhythm.     Pulses: Normal pulses.     Heart sounds: Normal heart sounds. No murmur. No friction rub. No gallop.   Pulmonary:     Effort: Pulmonary effort is normal. No respiratory distress.     Breath sounds: Normal breath sounds. No stridor. No wheezing, rhonchi or rales.  Chest:     Chest wall: No tenderness.  Musculoskeletal: Normal range of motion.  Skin:    General: Skin is warm and dry.     Capillary Refill: Capillary refill takes less than 2 seconds.     Coloration: Skin is not jaundiced or pale.     Findings: No bruising, erythema, lesion or rash.  Neurological:     General: No focal deficit present.     Mental Status: She is alert and oriented to person, place, and time. Mental status is at baseline.  Psychiatric:        Mood and Affect: Mood normal.        Behavior: Behavior normal.        Thought Content: Thought content normal.        Judgment: Judgment normal.     Results for orders placed or performed in visit on 01/22/19  Microscopic Examination   URINE  Result Value Ref Range   WBC, UA 0-5 0 - 5 /hpf   RBC 0-2 0 - 2 /hpf   Epithelial Cells (non renal) 0-10 0 - 10 /hpf   Bacteria, UA  Few (A) None seen/Few  Urine Culture, Reflex   URINE  Result Value Ref Range   Urine Culture, Routine Final report    Organism ID, Bacteria Comment   CBC with Differential/Platelet  Result Value Ref Range   WBC 10.3 3.4 - 10.8 x10E3/uL   RBC 4.82 3.77 - 5.28 x10E6/uL   Hemoglobin 15.6 11.1 - 15.9 g/dL   Hematocrit 44.9 34.0 - 46.6 %  MCV 93 79 - 97 fL   MCH 32.4 26.6 - 33.0 pg   MCHC 34.7 31.5 - 35.7 g/dL   RDW 12.3 11.7 - 15.4 %   Platelets 245 150 - 450 x10E3/uL   Neutrophils 77 Not Estab. %   Lymphs 17 Not Estab. %   Monocytes 6 Not Estab. %   Eos 0 Not Estab. %   Basos 0 Not Estab. %   Neutrophils Absolute 7.8 (H) 1.4 - 7.0 x10E3/uL   Lymphocytes Absolute 1.8 0.7 - 3.1 x10E3/uL   Monocytes Absolute 0.6 0.1 - 0.9 x10E3/uL   EOS (ABSOLUTE) 0.0 0.0 - 0.4 x10E3/uL   Basophils Absolute 0.0 0.0 - 0.2 x10E3/uL   Immature Granulocytes 0 Not Estab. %   Immature Grans (Abs) 0.0 0.0 - 0.1 x10E3/uL  UA/M w/rflx Culture, Routine   Specimen: Urine   URINE  Result Value Ref Range   Specific Gravity, UA 1.015 1.005 - 1.030   pH, UA 6.5 5.0 - 7.5   Color, UA Yellow Yellow   Appearance Ur Clear Clear   Leukocytes,UA Negative Negative   Protein,UA 1+ (A) Negative/Trace   Glucose, UA Negative Negative   Ketones, UA Negative Negative   RBC, UA Trace (A) Negative   Bilirubin, UA Negative Negative   Urobilinogen, Ur 2.0 (H) 0.2 - 1.0 mg/dL   Nitrite, UA Negative Negative   Microscopic Examination See below:    Urinalysis Reflex Comment   Comprehensive metabolic panel  Result Value Ref Range   Glucose 140 (H) 65 - 99 mg/dL   BUN 9 8 - 27 mg/dL   Creatinine, Ser 0.85 0.57 - 1.00 mg/dL   GFR calc non Af Amer 67 >59 mL/min/1.73   GFR calc Af Amer 77 >59 mL/min/1.73   BUN/Creatinine Ratio 11 (L) 12 - 28   Sodium 140 134 - 144 mmol/L   Potassium 3.5 3.5 - 5.2 mmol/L   Chloride 94 (L) 96 - 106 mmol/L   CO2 26 20 - 29 mmol/L   Calcium 10.0 8.7 - 10.3 mg/dL   Total Protein 7.5 6.0 -  8.5 g/dL   Albumin 4.5 3.7 - 4.7 g/dL   Globulin, Total 3.0 1.5 - 4.5 g/dL   Albumin/Globulin Ratio 1.5 1.2 - 2.2   Bilirubin Total 0.4 0.0 - 1.2 mg/dL   Alkaline Phosphatase 86 39 - 117 IU/L   AST 23 0 - 40 IU/L   ALT 15 0 - 32 IU/L  TSH  Result Value Ref Range   TSH 1.810 0.450 - 4.500 uIU/mL      Assessment & Plan:   Problem List Items Addressed This Visit      Genitourinary   Benign hypertensive renal disease    Under good control on current regimen. Continue current regimen. Continue to monitor. Call with any concerns. Refills given.          Other   Depression - Primary    Still very flat affect. She notes that she is feeling a bit better. Has been seeing psychiatry and working with them. To see neurology again on 03/11/19. Continue to monitor. Call with any concerns.        Other Visit Diagnoses    Weight loss       Down another 3 pounds in 3 weeks. Ecouraged the use of straws and ensure. Small meals if she can't do a big one. Recheck 2 months.        Follow up plan: Return in about 2 months (around  05/09/2019).      

## 2019-03-09 ENCOUNTER — Other Ambulatory Visit: Payer: Self-pay | Admitting: Family Medicine

## 2019-03-10 ENCOUNTER — Encounter: Payer: Self-pay | Admitting: Family Medicine

## 2019-03-10 NOTE — Assessment & Plan Note (Signed)
Under good control on current regimen. Continue current regimen. Continue to monitor. Call with any concerns. Refills given.   

## 2019-03-10 NOTE — Assessment & Plan Note (Signed)
Still very flat affect. She notes that she is feeling a bit better. Has been seeing psychiatry and working with them. To see neurology again on 03/11/19. Continue to monitor. Call with any concerns.

## 2019-03-12 ENCOUNTER — Ambulatory Visit (INDEPENDENT_AMBULATORY_CARE_PROVIDER_SITE_OTHER): Payer: Medicare Other | Admitting: Pharmacist

## 2019-03-12 DIAGNOSIS — F331 Major depressive disorder, recurrent, moderate: Secondary | ICD-10-CM

## 2019-03-12 NOTE — Chronic Care Management (AMB) (Signed)
  Chronic Care Management   Follow Up Note   03/12/2019 Name: Cynthia Vaughan MRN: [id number] DOB: 12-26-1940  Referred by: [name], DO Reason for referral : Chronic Care Management (Medication Management)   Cynthia Vaughan is a 78 y.o. year old female who is a primary care patient of [name], DO. The CCM team was consulted for assistance with chronic disease management and care coordination needs.    Spoke with husband, Cynthia Vaughan, today regarding medication management.  Review of patient status, including review of consultants reports, relevant laboratory and other test results, and collaboration with appropriate care team members and the patient's provider was performed as part of comprehensive patient evaluation and provision of chronic care management services.    Goals Addressed            This Visit's Progress     Patient Stated   . "She might need help managing her medications" (pt-stated)       Current Barriers:  . Diminished mood/flat affect, as well as "sleepiness" over the past few months. Additionally, patient struggling with tremor that inhibits some activities (husband notes she cannot hold a cup of coffee) . Established with psychiatry on 02/18/2019 - d/c aripiprazole and reduced duloxetine, started bupropion. Patient's husband notes that it took ~1 week to receive from [place], so she started the new regimen last week. He is unsure he has seen any change yet . Husband concerned about tremor, notes that neurology follow up is not until September   Pharmacist Clinical Goal(s):  [name] Over the next 90 days, patient will work with primary care provider and CCM team to address needs related to optimized medication management  Interventions: . Counseled husband that it generally takes a few weeks to see full impact of antidepressant changes. Discussed that bupropion is activating and will hopefully help with increased mood/energy. Follow up with psych scheduled  04/01/2019 . Encouraged husband to attempt to move neurology appointment to sooner if he is concerned about her tremor. He verbalized understanding.   Patient Self Care Activities:  . Currently UNABLE TO independently manage health  Please see past updates related to this goal by clicking on the "Past Updates" button in the selected goal          Plan:  - Will outreach patient/husband in the next 3-4 weeks for continued medication management support  [name], PharmD Clinical Pharmacist [place] [phone]

## 2019-03-12 NOTE — Patient Instructions (Signed)
Visit Information  Goals Addressed            This Visit's Progress     Patient Stated   . "She might need help managing her medications" (pt-stated)       Current Barriers:  . Diminished mood/flat affect, as well as "sleepiness" over the past few months. Additionally, patient struggling with tremor that inhibits some activities (husband notes she cannot hold a cup of coffee) . Established with psychiatry on 02/18/2019 - d/c aripiprazole and reduced duloxetine, started bupropion. Patient's husband notes that it took ~1 week to receive from [place], so she started the new regimen last week. He is unsure he has seen any change yet . Husband concerned about tremor, notes that neurology follow up is not until September   Pharmacist Clinical Goal(s):  [name] Over the next 90 days, patient will work with primary care provider and CCM team to address needs related to optimized medication management  Interventions: . Counseled husband that it generally takes a few weeks to see full impact of antidepressant changes. Discussed that bupropion is activating and will hopefully help with increased mood/energy. Follow up with psych scheduled 04/01/2019 . Encouraged husband to attempt to move neurology appointment to sooner if he is concerned about her tremor. He verbalized understanding.   Patient Self Care Activities:  . Currently UNABLE TO independently manage health  Please see past updates related to this goal by clicking on the "Past Updates" button in the selected goal         The patient verbalized understanding of instructions provided today and declined a print copy of patient instruction materials.  Plan:  - Will outreach patient/husband in the next 3-4 weeks for continued medication management support  [name], PharmD Clinical Pharmacist [place] [phone]

## 2019-03-13 DIAGNOSIS — M48061 Spinal stenosis, lumbar region without neurogenic claudication: Secondary | ICD-10-CM | POA: Diagnosis not present

## 2019-03-13 DIAGNOSIS — F322 Major depressive disorder, single episode, severe without psychotic features: Secondary | ICD-10-CM | POA: Diagnosis not present

## 2019-03-13 DIAGNOSIS — M5135 Other intervertebral disc degeneration, thoracolumbar region: Secondary | ICD-10-CM | POA: Diagnosis not present

## 2019-03-13 DIAGNOSIS — G459 Transient cerebral ischemic attack, unspecified: Secondary | ICD-10-CM | POA: Diagnosis not present

## 2019-03-13 DIAGNOSIS — G3184 Mild cognitive impairment, so stated: Secondary | ICD-10-CM | POA: Diagnosis not present

## 2019-03-13 DIAGNOSIS — R413 Other amnesia: Secondary | ICD-10-CM | POA: Diagnosis not present

## 2019-03-14 DIAGNOSIS — M5135 Other intervertebral disc degeneration, thoracolumbar region: Secondary | ICD-10-CM | POA: Diagnosis not present

## 2019-03-14 DIAGNOSIS — G3184 Mild cognitive impairment, so stated: Secondary | ICD-10-CM | POA: Diagnosis not present

## 2019-03-14 DIAGNOSIS — G459 Transient cerebral ischemic attack, unspecified: Secondary | ICD-10-CM | POA: Diagnosis not present

## 2019-03-14 DIAGNOSIS — M48061 Spinal stenosis, lumbar region without neurogenic claudication: Secondary | ICD-10-CM | POA: Diagnosis not present

## 2019-03-14 DIAGNOSIS — R413 Other amnesia: Secondary | ICD-10-CM | POA: Diagnosis not present

## 2019-03-14 DIAGNOSIS — F322 Major depressive disorder, single episode, severe without psychotic features: Secondary | ICD-10-CM | POA: Diagnosis not present

## 2019-03-27 ENCOUNTER — Telehealth: Payer: Self-pay

## 2019-04-01 ENCOUNTER — Encounter: Payer: Self-pay | Admitting: Psychiatry

## 2019-04-01 ENCOUNTER — Ambulatory Visit (INDEPENDENT_AMBULATORY_CARE_PROVIDER_SITE_OTHER): Payer: Medicare Other | Admitting: Psychiatry

## 2019-04-01 ENCOUNTER — Other Ambulatory Visit: Payer: Self-pay

## 2019-04-01 DIAGNOSIS — F331 Major depressive disorder, recurrent, moderate: Secondary | ICD-10-CM

## 2019-04-01 DIAGNOSIS — F09 Unspecified mental disorder due to known physiological condition: Secondary | ICD-10-CM | POA: Diagnosis not present

## 2019-04-01 NOTE — Progress Notes (Signed)
Virtual Visit via Video Note  I connected with Cynthia Vaughan on 04/01/19 at  4:15 PM EDT by a video enabled telemedicine application and verified that I am speaking with the correct person using two identifiers.   I discussed the limitations of evaluation and management by telemedicine and the availability of in person appointments. The patient expressed understanding and agreed to proceed.    I discussed the assessment and treatment plan with the patient. The patient was provided an opportunity to ask questions and all were answered. The patient agreed with the plan and demonstrated an understanding of the instructions.   The patient was advised to call back or seek an in-person evaluation if the symptoms worsen or if the condition fails to improve as anticipated.  [place] MD OP Progress Note  04/01/2019 5:08 PM Cynthia Vaughan  MRN:  [id number]  Chief Complaint:  Chief Complaint    Follow-up     HPI: Cynthia Vaughan is a 78 year old Caucasian female, married, lives in [place], has a history of cognitive disorder, MDD, hypertension, TIA, GERD, osteoarthritis, spinal scoliosis, was evaluated by telemedicine today.  Collateral information was obtained from [name]-husband.  Patient today appeared to be alert, oriented to person place situation.  She also appeared to be able to answer questions related to immediate and recent as well as remote memory okay with some prompts.  Patient reports she is doing well on the current medication regimen.  She reports she has noticed some improvement in her energy and motivation.  She reports her depressive symptoms is improving.  She reports sleep is okay.  She denies any suicidality, homicidality or perceptual disturbances.  Patient denies any side effects to medications.  Patient as well as husband denies any other concerns today. Visit Diagnosis:    ICD-10-CM   1. MDD (major depressive disorder), recurrent episode, moderate (HCC)  F33.1   2. Mild  cognitive disorder  F09     Past Psychiatric History: I have reviewed past psychiatric history from my progress note on 02/18/2019.  Past trials of Cymbalta, Abilify.  Past Medical History:  Past Medical History:  Diagnosis Date  . Allergy   . Anxiety   . Cancer ([place])    SKIN  . CKD (chronic kidney disease) stage 2, GFR 60-89 ml/min   . Depression   . Edema    LEGS/FEET  . GERD (gastroesophageal reflux disease)   . Heart murmur   . Hyperlipidemia   . Hypertension   . Hypopotassemia   . Microalbuminuria   . Osteoarthritis   . Osteopenia   . [facility] spotted fever) 05/10/2016  . Urinary incontinence   . Vitamin D deficiency disease   . Wheezing     Past Surgical History:  Procedure Laterality Date  . ABDOMINAL HYSTERECTOMY  2004   Total (due to bladder surgery)  . BLADDER SURGERY     x 2  . BUNIONECTOMY    . CATARACT EXTRACTION W/PHACO Right 11/23/2015   Procedure: CATARACT EXTRACTION PHACO AND INTRAOCULAR LENS PLACEMENT (IOC);  Surgeon: Estill Cotta, MD;  Location: [facility] ORS;  Service: Ophthalmology;  Laterality: Right;  [place]    1:30.9 AP%  26.9 CDE   42.29 flud casette lot # [id number]  exp05/31/2018  . COLONOSCOPY      Family Psychiatric History: I have reviewed family psychiatric history from my progress note on 02/18/2019.  Family History:  Family History  Problem Relation Age of Onset  . AAA (abdominal aortic aneurysm) Mother   .  Arthritis Brother   . Heart disease Brother   . Stroke Daughter   . Diabetes Son   . Stroke Son   . Seizures Son   . Diabetes Brother   . Heart disease Brother        7 total heart attacks  . Hyperlipidemia Brother   . Hypertension Brother   . Dementia Brother   . AAA (abdominal aortic aneurysm) Brother   . Stroke Son   . Heart disease Brother   . Dementia Sister     Social History: I have reviewed social history from my progress note on 02/18/2019. Social History   Socioeconomic History  . Marital status:  Married    Spouse name: hal  . Number of children: 3  . Years of education: Not on file  . Highest education level: GED or equivalent  Occupational History  . Not on file  Social Needs  . Financial resource strain: Not hard at all  . Food insecurity    Worry: Never true    Inability: Never true  . Transportation needs    Medical: Patient refused    Non-medical: Patient refused  Tobacco Use  . Smoking status: Former Research scientist (life sciences)  . Smokeless tobacco: Never Used  . Tobacco comment: quit 20 years ago   Substance and Sexual Activity  . Alcohol use: Yes    Comment: OCCAS  . Drug use: No  . Sexual activity: Never  Lifestyle  . Physical activity    Days per week: 0 days    Minutes per session: 0 min  . Stress: To some extent  Relationships  . Social Herbalist on phone: Patient refused    Gets together: Patient refused    Attends religious service: More than 4 times per year    Active member of club or organization: Yes    Attends meetings of clubs or organizations: More than 4 times per year    Relationship status: Married  Other Topics Concern  . Not on file  Social History Narrative  . Not on file    Allergies:  Allergies  Allergen Reactions  . Codeine Itching  . Lisinopril Cough    Metabolic Disorder Labs: Lab Results  Component Value Date   HGBA1C 5.5 12/19/2017   MPG 111.15 12/19/2017   No results found for: PROLACTIN Lab Results  Component Value Date   CHOL 180 08/31/2018   TRIG 90 08/31/2018   HDL 73 08/31/2018   CHOLHDL 2.8 12/19/2017   VLDL 12 12/19/2017   LDLCALC 89 08/31/2018   LDLCALC 81 06/01/2018   Lab Results  Component Value Date   TSH 1.810 01/22/2019   TSH 2.050 08/31/2018    Therapeutic Level Labs: No results found for: LITHIUM No results found for: VALPROATE No components found for:  CBMZ  Current Medications: Current Outpatient Medications  Medication Sig Dispense Refill  . acetaminophen (TYLENOL) 500 MG tablet Take  500 mg by mouth every 6 (six) hours as needed.    [name] amLODipine (NORVASC) 10 MG tablet Take 1 tablet (10 mg total) by mouth daily. 90 tablet 3  . aspirin EC 81 MG tablet Take 81 mg by mouth daily.    [name] atorvastatin (LIPITOR) 40 MG tablet Take 1 tablet (40 mg total) by mouth at bedtime. 90 tablet 3  . buPROPion (WELLBUTRIN) 75 MG tablet Take 1 tablet (75 mg total) by mouth daily with breakfast. 90 tablet 0  . diclofenac sodium (VOLTAREN) 1 % GEL Apply 4  g topically 4 (four) times daily. 100 g 3  . donepezil (ARICEPT) 10 MG tablet Take 1 tablet (10 mg total) by mouth at bedtime. 90 tablet 1  . DULoxetine (CYMBALTA) 30 MG capsule Take 1 capsule (30 mg total) by mouth daily. 90 capsule 0  . fluticasone (FLONASE) 50 MCG/ACT nasal spray Place 2 sprays into both nostrils daily. 48 g 3  . gabapentin (NEURONTIN) 100 MG capsule Take 1 capsule (100 mg total) by mouth 3 (three) times daily. 270 capsule 0  . loratadine (CLARITIN) 10 MG tablet Take 1 tablet (10 mg total) by mouth daily. 90 tablet 3  . losartan (COZAAR) 50 MG tablet Take 1 tablet (50 mg total) by mouth daily. 90 tablet 3  . omeprazole (PRILOSEC) 20 MG capsule Take 1 capsule (20 mg total) by mouth daily. 90 capsule 0   No current facility-administered medications for this visit.      Musculoskeletal: Strength & Muscle Tone: UTA Gait & Station: Observed as sitting Patient leans: N/A  Psychiatric Specialty Exam: Review of Systems  Psychiatric/Behavioral: Negative for depression.  All other systems reviewed and are negative.   There were no vitals taken for this visit.There is no height or weight on file to calculate BMI.  General Appearance: Casual  Eye Contact:  Fair  Speech:  Clear and Coherent  Volume:  Normal  Mood:  Euthymic  Affect:  Flat  Thought Process:  Goal Directed and Descriptions of Associations: Intact  Orientation:  Other:  person, place, situation and month, year  Thought Content: Logical   Suicidal Thoughts:  No   Homicidal Thoughts:  No  Memory:  Immediate;   Fair Recent;   Fair Remote;   Fair  Judgement:  Fair  Insight:  Fair  Psychomotor Activity:  Normal  Concentration:  Concentration: Fair and Attention Span: Fair  Recall:  [employer] of Knowledge: Fair  Language: Fair  Akathisia:  No  Handed:  Right  AIMS (if indicated): reports tremors on and off- chronic  Assets:  Communication Skills Desire for Improvement Social Support  ADL's:  Intact  Cognition: WNL  Sleep:  Fair   Screenings: GAD-7     Office Visit from 03/08/2019 in [facility] Visit from 02/14/2019 in [facility] Office Visit from 08/31/2018 in [facility] Visit from 04/03/2018 in [facility] Visit from 02/27/2018 in [facility]  Total GAD-7 Score  1  7  3  10  11     Mini-Mental     Office Visit from 12/04/2018 in [facility] Visit from 12/18/2017 in [facility]  Total Score (max 30 points )  22  24    PHQ2-9     Office Visit from 03/08/2019 in [facility] Visit from 02/14/2019 in [facility] Visit from 12/04/2018 in [place] Visit from 08/31/2018 in [facility] Visit from 06/01/2018 in [facility]  PHQ-2 Total Score  0  4  1  1  1   PHQ-9 Total Score  5  12  7  3  4        Assessment and Plan: Michela is a 78 year old Caucasian female, married, retired, lives in [place], has a history of depression, hypertension, cognitive disorder, TIA, spinal stenosis was evaluated by telemedicine today.  Patient is biologically predisposed given her chronic health problems.  She also has psychosocial stressors of physical limitations due to chronic medical issues.  Patient however has good social support system.  She is currently making progress on the current medication regimen.  Plan MDD-improving Cymbalta at reduced dosage of 30  mg p.o. daily Wellbutrin 75 mg p.o. daily with breakfast   For cognitive disorder-mild-she will continue to work with neurology. Continue Aricept 10 mg p.o. daily  Collateral information was obtained from husband as noted above.  Follow-up in clinic in 1 to 2 months or sooner if needed.  September 28 at 4:15 PM  I have spent atleast 15 minutes non face to face with patient today. More than 50 % of the time was spent for psychoeducation and supportive psychotherapy and care coordination.  This note was generated in part or whole with voice recognition software. Voice recognition is usually quite accurate but there are transcription errors that can and very often do occur. I apologize for any typographical errors that were not detected and corrected.        Ursula Alert, MD 04/01/2019, 5:08 PM

## 2019-04-08 ENCOUNTER — Other Ambulatory Visit: Payer: Self-pay | Admitting: Psychiatry

## 2019-04-08 DIAGNOSIS — F331 Major depressive disorder, recurrent, moderate: Secondary | ICD-10-CM

## 2019-04-10 ENCOUNTER — Ambulatory Visit (INDEPENDENT_AMBULATORY_CARE_PROVIDER_SITE_OTHER): Payer: Medicare Other | Admitting: Pharmacist

## 2019-04-10 DIAGNOSIS — R400 Somnolence: Secondary | ICD-10-CM

## 2019-04-10 DIAGNOSIS — F331 Major depressive disorder, recurrent, moderate: Secondary | ICD-10-CM | POA: Diagnosis not present

## 2019-04-10 NOTE — Patient Instructions (Signed)
Visit Information  Goals Addressed            This Visit's Progress     Patient Stated   . "She might need help managing her medications" (pt-stated)       Current Barriers:  . Diminished mood/flat affect, as well as "sleepiness" over the past few months. Additionally, patient struggling with tremor that inhibits some activities (husband notes she cannot hold a cup of coffee) . Established with psychiatry on 02/18/2019 - d/c aripiprazole and reduced duloxetine, started bupropion. At follow up appointment on 04/01/2019, it was noted that patient was improved; reported improvement in energy/motivation, as well as improvement in mood. Denies trouble sleeping. . Today, patient's husband, [name], confirms the above information. Also notes possible improvement in tremor. Confirms neurology follow up on 05/01/2019 with Dr. Melrose Nakayama o Does note that he picked up a prescription for donepezil 10 mg daily from [facility] that was prescribed by neurology. Somewhat frustrated, as they prefer all medications come from [facility]. This was how patient accidentally took 20 mg donepezil for a while; they filled prescriptions at both [facility] and [facility] (unsure how insurance allowed this)   Pharmacist Clinical Goal(s):  [name] Over the next 90 days, patient will work with primary care provider and CCM team to address needs related to optimized medication management  Interventions: . Husband knows to look out at upcoming [facility] fill and if donepezil is included, to not give an extra donepezil from the [facility] fill. Encouraged him to update the patient's preferred pharmacy at [facility] when they go next month  Patient Self Care Activities:  . Currently UNABLE TO independently manage health  Please see past updates related to this goal by clicking on the "Past Updates" button in the selected goal         The patient verbalized understanding of instructions provided today and declined a print copy of  patient instruction materials.   Plan:  - Husband and patient have CCM team contact information for future questions or concerns - RN CM will outreach patient in the next 4-5 weeks for continued support   [name], PharmD Clinical Pharmacist [place] [phone]

## 2019-04-10 NOTE — Chronic Care Management (AMB) (Signed)
Chronic Care Management   Follow Up Note   04/10/2019 Name: Cynthia Vaughan MRN: [id number] DOB: 1941/03/17  Referred by: [name], DO Reason for referral : Chronic Care Management (Medication Management)   Cynthia Vaughan is a 78 y.o. year old female who is a primary care patient of [name], DO. The CCM team was consulted for assistance with chronic disease management and care coordination needs.    Contacted patient's husband today to discuss medication management  Review of patient status, including review of consultants reports, relevant laboratory and other test results, and collaboration with appropriate care team members and the patient's provider was performed as part of comprehensive patient evaluation and provision of chronic care management services.    SDOH (Social Determinants of Health) screening performed today: Depression  . See Care Plan for related entries.   Outpatient Encounter Medications as of 04/10/2019  Medication Sig Note  . buPROPion (WELLBUTRIN) 75 MG tablet Take 1 tablet (75 mg total) by mouth daily with breakfast.   . donepezil (ARICEPT) 10 MG tablet Take 1 tablet (10 mg total) by mouth at bedtime.   . DULoxetine (CYMBALTA) 30 MG capsule Take 1 capsule (30 mg total) by mouth daily.   [name] acetaminophen (TYLENOL) 500 MG tablet Take 500 mg by mouth every 6 (six) hours as needed. 01/22/2019: Taking ~1/day  . amLODipine (NORVASC) 10 MG tablet Take 1 tablet (10 mg total) by mouth daily.   [name] aspirin EC 81 MG tablet Take 81 mg by mouth daily.   [name] atorvastatin (LIPITOR) 40 MG tablet Take 1 tablet (40 mg total) by mouth at bedtime.   . diclofenac sodium (VOLTAREN) 1 % GEL Apply 4 g topically 4 (four) times daily.   . fluticasone (FLONASE) 50 MCG/ACT nasal spray Place 2 sprays into both nostrils daily.   [name] gabapentin (NEURONTIN) 100 MG capsule Take 1 capsule (100 mg total) by mouth 3 (three) times daily.   [name] loratadine (CLARITIN) 10 MG tablet Take 1  tablet (10 mg total) by mouth daily.   [name] losartan (COZAAR) 50 MG tablet Take 1 tablet (50 mg total) by mouth daily.   [name] omeprazole (PRILOSEC) 20 MG capsule Take 1 capsule (20 mg total) by mouth daily.    No facility-administered encounter medications on file as of 04/10/2019.      Goals Addressed            This Visit's Progress     Patient Stated   . "She might need help managing her medications" (pt-stated)       Current Barriers:  . Diminished mood/flat affect, as well as "sleepiness" over the past few months. Additionally, patient struggling with tremor that inhibits some activities (husband notes she cannot hold a cup of coffee) . Established with psychiatry on 02/18/2019 - d/c aripiprazole and reduced duloxetine, started bupropion. At follow up appointment on 04/01/2019, it was noted that patient was improved; reported improvement in energy/motivation, as well as improvement in mood. Denies trouble sleeping. . Today, patient's husband, [name], confirms the above information. Also notes possible improvement in tremor. Confirms neurology follow up on 05/01/2019 with Dr. Melrose Nakayama o Does note that he picked up a prescription for donepezil 10 mg daily from [facility] that was prescribed by neurology. Somewhat frustrated, as they prefer all medications come from [facility]. This was how patient accidentally took 20 mg donepezil for a while; they [facility] prescriptions at both [facility] and [facility] (unsure how insurance allowed this)  Pharmacist Clinical Goal(s):  [name] Over the next 90 days, patient will work with primary care provider and CCM team to address needs related to optimized medication management  Interventions: . Husband knows to look out at upcoming [facility] fill and if donepezil is included, to not give an extra donepezil from the [facility] fill. Encouraged him to update the patient's preferred pharmacy at [facility] when they go next month  Patient Self Care Activities:  .  Currently UNABLE TO independently manage health  Please see past updates related to this goal by clicking on the "Past Updates" button in the selected goal          Plan:  - Husband and patient have CCM team contact information for future questions or concerns - RN CM will outreach patient in the next 4-5 weeks for continued support   [name], PharmD Clinical Pharmacist [place] [phone]

## 2019-04-12 ENCOUNTER — Telehealth: Payer: Self-pay

## 2019-05-03 ENCOUNTER — Telehealth: Payer: Self-pay

## 2019-05-08 DIAGNOSIS — R413 Other amnesia: Secondary | ICD-10-CM | POA: Diagnosis not present

## 2019-05-08 DIAGNOSIS — R251 Tremor, unspecified: Secondary | ICD-10-CM | POA: Diagnosis not present

## 2019-05-16 ENCOUNTER — Ambulatory Visit (INDEPENDENT_AMBULATORY_CARE_PROVIDER_SITE_OTHER): Payer: Medicare Other

## 2019-05-16 ENCOUNTER — Other Ambulatory Visit: Payer: Self-pay

## 2019-05-16 DIAGNOSIS — Z23 Encounter for immunization: Secondary | ICD-10-CM | POA: Diagnosis not present

## 2019-05-20 ENCOUNTER — Other Ambulatory Visit: Payer: Self-pay

## 2019-05-20 ENCOUNTER — Encounter: Payer: Self-pay | Admitting: Psychiatry

## 2019-05-20 ENCOUNTER — Ambulatory Visit (INDEPENDENT_AMBULATORY_CARE_PROVIDER_SITE_OTHER): Payer: Medicare Other | Admitting: Psychiatry

## 2019-05-20 DIAGNOSIS — F331 Major depressive disorder, recurrent, moderate: Secondary | ICD-10-CM | POA: Diagnosis not present

## 2019-05-20 DIAGNOSIS — F09 Unspecified mental disorder due to known physiological condition: Secondary | ICD-10-CM | POA: Diagnosis not present

## 2019-05-20 NOTE — Progress Notes (Signed)
Virtual Visit via Telephone Note  I connected with Cynthia Vaughan on 05/20/19 at  4:15 PM EDT by telephone and verified that I am speaking with the correct person using two identifiers.   I discussed the limitations, risks, security and privacy concerns of performing an evaluation and management service by telephone and the availability of in person appointments. I also discussed with the patient that there may be a patient responsible charge related to this service. The patient expressed understanding and agreed to proceed.   I discussed the assessment and treatment plan with the patient. The patient was provided an opportunity to ask questions and all were answered. The patient agreed with the plan and demonstrated an understanding of the instructions.   The patient was advised to call back or seek an in-person evaluation if the symptoms worsen or if the condition fails to improve as anticipated.  [facility] MD OP Progress Note  05/20/2019 5:12 PM Cynthia Vaughan  MRN:  [id number]  Chief Complaint:  Chief Complaint    Follow-up     HPI: Cynthia Vaughan is a 78 year old Caucasian female, married, lives in [place], has a history of cognitive disorder, MDD, hypertension, TIA, GERD, osteoarthritis, spinal scoliosis was evaluated by telemedicine today.  Collateral information was obtained from her husband-Cynthia Vaughan.  Patient today reports she is currently doing well on the current medication regimen.  She appeared to be alert, oriented to person place time and situation.  She was able to answer questions appropriately however in short phrases.  She reports sleep and appetite is fair.  She denies any concerns with her medications.  She reports she is compliant on them.  Her husband also reports she is doing well.   Visit Diagnosis:    ICD-10-CM   1. MDD (major depressive disorder), recurrent episode, moderate (HCC)  F33.1   2. Mild cognitive disorder  F09     Past Psychiatric History: I have  reviewed past psychiatric history from my progress note on 02/18/2019.  Past trials of Cymbalta, Abilify  Past Medical History:  Past Medical History:  Diagnosis Date  . Allergy   . Anxiety   . Cancer ([place])    SKIN  . CKD (chronic kidney disease) stage 2, GFR 60-89 ml/min   . Depression   . Edema    LEGS/FEET  . GERD (gastroesophageal reflux disease)   . Heart murmur   . Hyperlipidemia   . Hypertension   . Hypopotassemia   . Microalbuminuria   . Osteoarthritis   . Osteopenia   . [facility] spotted fever) 05/10/2016  . Urinary incontinence   . Vitamin D deficiency disease   . Wheezing     Past Surgical History:  Procedure Laterality Date  . ABDOMINAL HYSTERECTOMY  2004   Total (due to bladder surgery)  . BLADDER SURGERY     x 2  . BUNIONECTOMY    . CATARACT EXTRACTION W/PHACO Right 11/23/2015   Procedure: CATARACT EXTRACTION PHACO AND INTRAOCULAR LENS PLACEMENT (IOC);  Surgeon: Estill Cotta, MD;  Location: [facility] ORS;  Service: Ophthalmology;  Laterality: Right;  [place]    1:30.9 AP%  26.9 CDE   42.29 flud casette lot # [id number]  exp05/31/2018  . COLONOSCOPY      Family Psychiatric History: I have reviewed family psychiatric history from my progress note on 02/18/2019  Family History:  Family History  Problem Relation Age of Onset  . AAA (abdominal aortic aneurysm) Mother   . Arthritis Brother   . Heart disease  Brother   . Stroke Daughter   . Diabetes Son   . Stroke Son   . Seizures Son   . Diabetes Brother   . Heart disease Brother        7 total heart attacks  . Hyperlipidemia Brother   . Hypertension Brother   . Dementia Brother   . AAA (abdominal aortic aneurysm) Brother   . Stroke Son   . Heart disease Brother   . Dementia Sister     Social History: I have reviewed social history from my progress note on 02/18/2019 Social History   Socioeconomic History  . Marital status: Married    Spouse name: Cynthia Vaughan  . Number of children: 3  . Years of  education: Not on file  . Highest education level: GED or equivalent  Occupational History  . Not on file  Social Needs  . Financial resource strain: Not hard at all  . Food insecurity    Worry: Never true    Inability: Never true  . Transportation needs    Medical: Patient refused    Non-medical: Patient refused  Tobacco Use  . Smoking status: Former Research scientist (life sciences)  . Smokeless tobacco: Never Used  . Tobacco comment: quit 20 years ago   Substance and Sexual Activity  . Alcohol use: Yes    Comment: OCCAS  . Drug use: No  . Sexual activity: Never  Lifestyle  . Physical activity    Days per week: 0 days    Minutes per session: 0 min  . Stress: To some extent  Relationships  . Social Herbalist on phone: Patient refused    Gets together: Patient refused    Attends religious service: More than 4 times per year    Active member of club or organization: Yes    Attends meetings of clubs or organizations: More than 4 times per year    Relationship status: Married  Other Topics Concern  . Not on file  Social History Narrative  . Not on file    Allergies:  Allergies  Allergen Reactions  . Codeine Itching  . Lisinopril Cough    Metabolic Disorder Labs: Lab Results  Component Value Date   HGBA1C 5.5 12/19/2017   MPG 111.15 12/19/2017   No results found for: PROLACTIN Lab Results  Component Value Date   CHOL 180 08/31/2018   TRIG 90 08/31/2018   HDL 73 08/31/2018   CHOLHDL 2.8 12/19/2017   VLDL 12 12/19/2017   LDLCALC 89 08/31/2018   LDLCALC 81 06/01/2018   Lab Results  Component Value Date   TSH 1.810 01/22/2019   TSH 2.050 08/31/2018    Therapeutic Level Labs: No results found for: LITHIUM No results found for: VALPROATE No components found for:  CBMZ  Current Medications: Current Outpatient Medications  Medication Sig Dispense Refill  . acetaminophen (TYLENOL) 500 MG tablet Take 500 mg by mouth every 6 (six) hours as needed.    [name] amLODipine  (NORVASC) 10 MG tablet Take 1 tablet (10 mg total) by mouth daily. 90 tablet 3  . aspirin EC 81 MG tablet Take 81 mg by mouth daily.    [name] atorvastatin (LIPITOR) 40 MG tablet Take 1 tablet (40 mg total) by mouth at bedtime. 90 tablet 3  . buPROPion (WELLBUTRIN) 75 MG tablet Take 1 tablet (75 mg total) by mouth daily with breakfast. 90 tablet 0  . diclofenac sodium (VOLTAREN) 1 % GEL Apply 4 g topically 4 (four) times daily. 100  g 3  . donepezil (ARICEPT) 10 MG tablet Take 1 tablet (10 mg total) by mouth at bedtime. 90 tablet 1  . DULoxetine (CYMBALTA) 30 MG capsule Take 1 capsule (30 mg total) by mouth daily. 90 capsule 0  . fluticasone (FLONASE) 50 MCG/ACT nasal spray Place 2 sprays into both nostrils daily. 48 g 3  . gabapentin (NEURONTIN) 100 MG capsule Take 1 capsule (100 mg total) by mouth 3 (three) times daily. 270 capsule 0  . loratadine (CLARITIN) 10 MG tablet Take 1 tablet (10 mg total) by mouth daily. 90 tablet 3  . losartan (COZAAR) 50 MG tablet Take 1 tablet (50 mg total) by mouth daily. 90 tablet 3  . omeprazole (PRILOSEC) 20 MG capsule Take 1 capsule (20 mg total) by mouth daily. 90 capsule 0   No current facility-administered medications for this visit.      Musculoskeletal: Strength & Muscle Tone: UTA Gait & Station: Reports as WNL Patient leans: N/A  Psychiatric Specialty Exam: Review of Systems  Psychiatric/Behavioral: Positive for memory loss.  All other systems reviewed and are negative.   There were no vitals taken for this visit.There is no height or weight on file to calculate BMI.  General Appearance: UTA  Eye Contact:  Fair  Speech:  Normal Rate  Volume:  Normal  Mood:  Euthymic  Affect:  UTA  Thought Process:  Goal Directed and Descriptions of Associations: Intact  Orientation:  Other:  time, place, situation  Thought Content: Logical   Suicidal Thoughts:  No  Homicidal Thoughts:  No  Memory:  Immediate;   baseline Recent;   baseline Remote;    baseline  Judgement:  Fair  Insight:  Fair  Psychomotor Activity:  UTA  Concentration:  Concentration: Fair and Attention Span: Fair  Recall:  [employer] of Knowledge: Fair  Language: Fair  Akathisia:  No  Handed:  Right  AIMS (if indicated): reports tremors, chronic on and off  Assets:  Communication Skills Desire for Improvement Housing Social Support  ADL's:  Intact  Cognition: WNLstable on medications  Sleep:  Fair   Screenings: GAD-7     Office Visit from 03/08/2019 in [place] Visit from 02/14/2019 in [place] Visit from 08/31/2018 in [place] Visit from 04/03/2018 in [place] Visit from 02/27/2018 in [place]  Total GAD-7 Score  1  7  3  10  11     Mini-Mental     Office Visit from 12/04/2018 in [place] Visit from 12/18/2017 in [facility]  Total Score (max 30 points )  22  24    PHQ2-9     Office Visit from 03/08/2019 in [place] Visit from 02/14/2019 in [place] Visit from 12/04/2018 in [place] Visit from 08/31/2018 in [place] Visit from 06/01/2018 in [place]  PHQ-2 Total Score  0  4  1  1  1   PHQ-9 Total Score  5  12  7  3  4        Assessment and Plan: [place] is a 78 year old Caucasian female, married, retired, lives in [place], has a history of depression, hypertension, cognitive disorder, TIA, spinal stenosis was evaluated by telemedicine today.  Patient is biologically predisposed given her chronic health problems.  She also has psychosocial stressors of physical limitation due to chronic medical problems.  Patient is currently doing well on  the current medications.  Plan as noted below.  Plan MDD-improving Cymbalta at reduced dosage of 30 mg p.o. daily Wellbutrin 75 mg p.o. daily with breakfast  Cognitive disorder-mild  she will continue to work with neurology Continue Aricept 10 mg p.o. daily.  Collateral information was obtained from husband as noted above.  Follow-up in clinic in 3 months or sooner if needed.  December 22 at 2 PM  I have spent atleast 15 minutes non face to face with patient today. More than 50 % of the time was spent for psychoeducation and supportive psychotherapy and care coordination. This note was generated in part or whole with voice recognition software. Voice recognition is usually quite accurate but there are transcription errors that can and very often do occur. I apologize for any typographical errors that were not detected and corrected.      Ursula Alert, MD 05/20/2019, 5:12 PM

## 2019-05-21 ENCOUNTER — Telehealth: Payer: Self-pay

## 2019-05-29 ENCOUNTER — Telehealth: Payer: Self-pay

## 2019-06-02 ENCOUNTER — Other Ambulatory Visit: Payer: Self-pay | Admitting: Family Medicine

## 2019-06-04 IMAGING — CR DG CHEST 2V
2 series · 2 of 2 positions shown · non-contrast
Comparison: 12/18/2017

CLINICAL DATA: Weakness and stroke-like symptoms today.

EXAM:
CHEST - 2 VIEW

[chest pa]
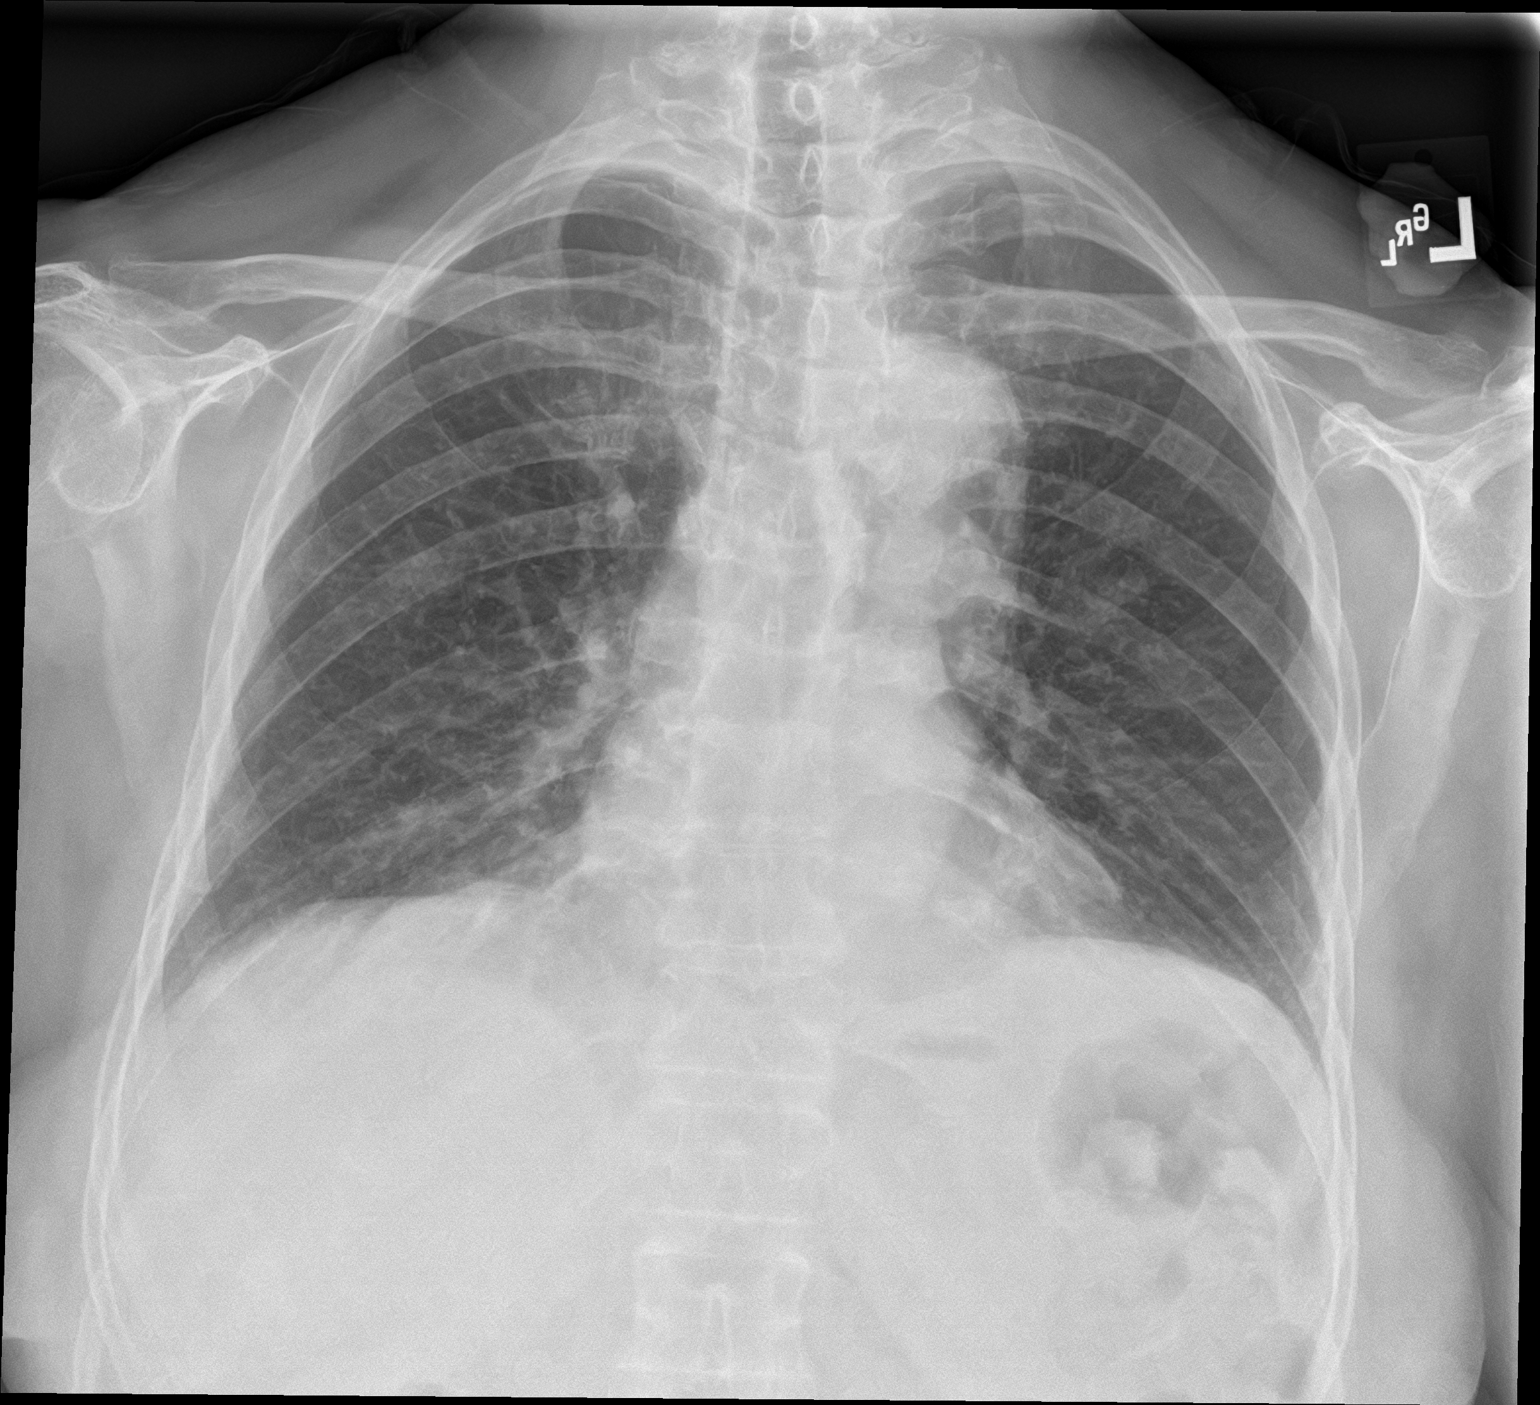

[chest lat]
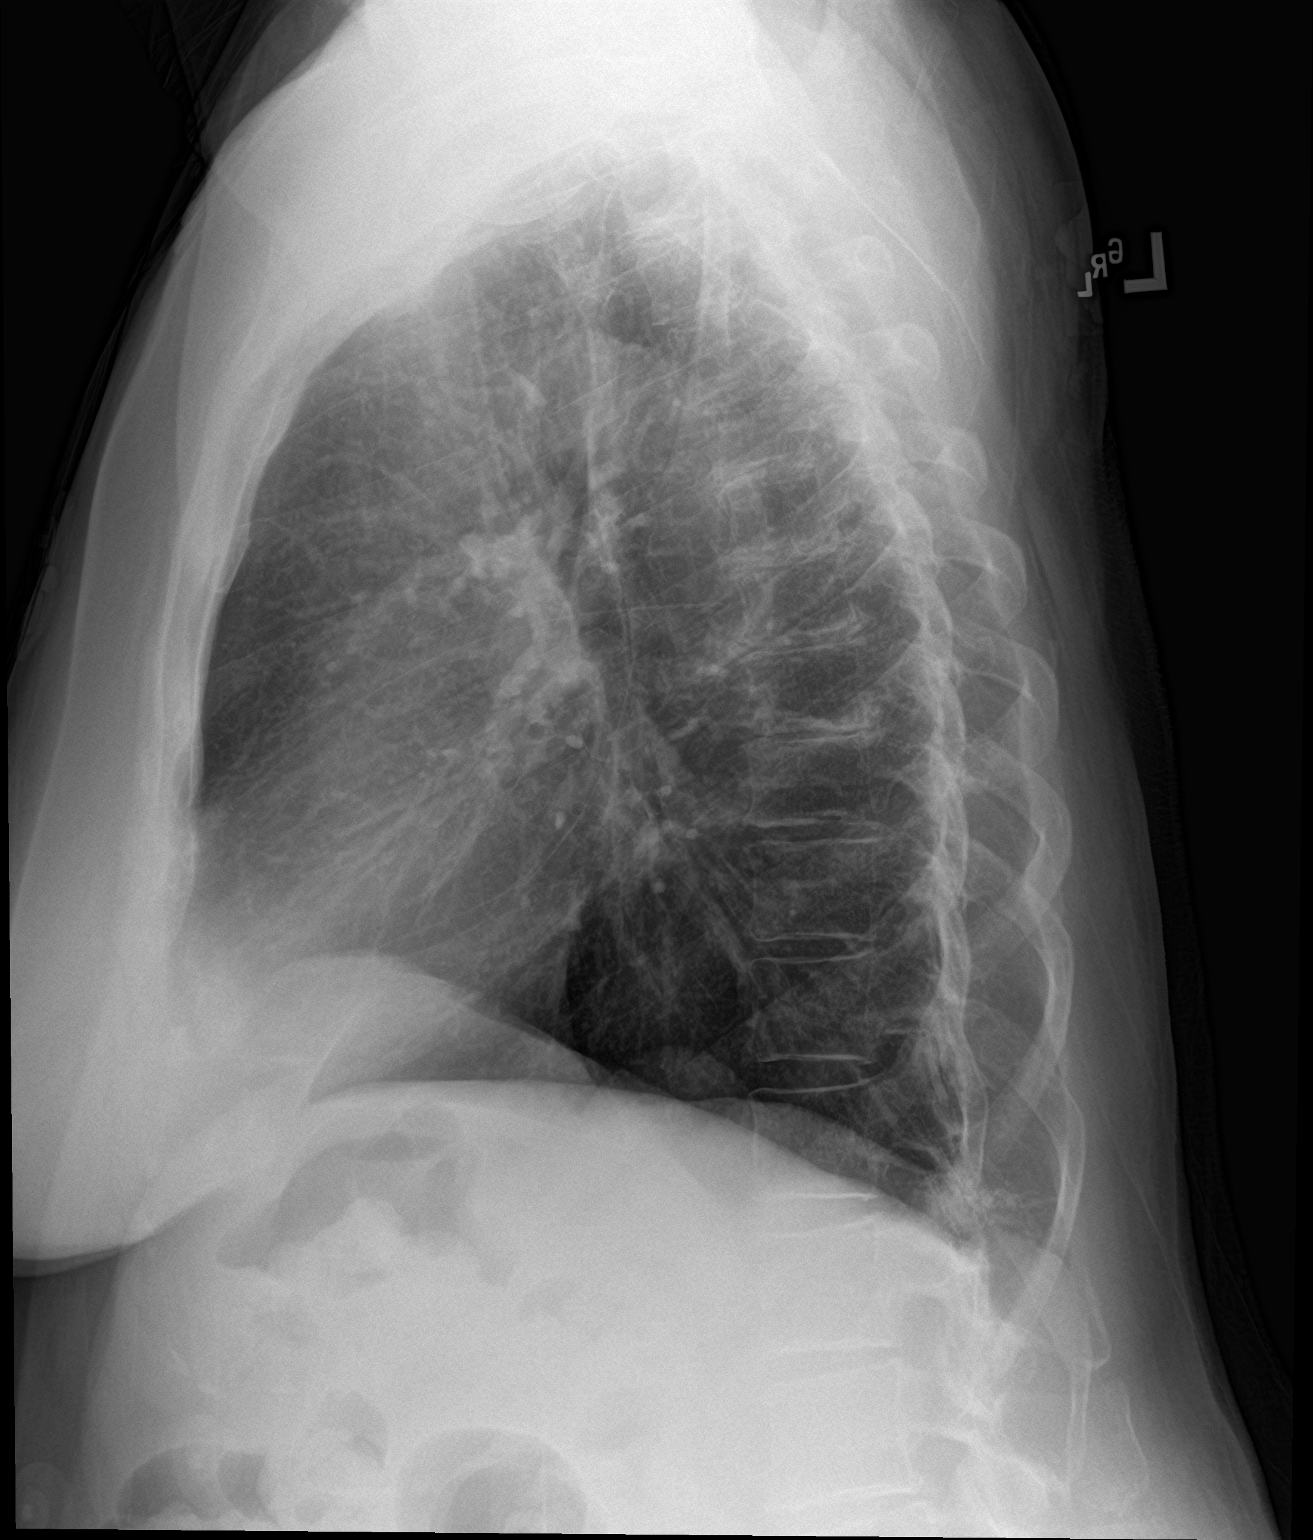

[2 of 2 positions shown; findings below may reference images not displayed]

FINDINGS: Shallow inspiration. Heart size and pulmonary vascularity are normal
for technique. Slight interstitial pattern to the lungs with some
peribronchial thickening likely representing chronic bronchitis. No
airspace disease or consolidation. No blunting of costophrenic
angles. No pneumothorax. Mediastinal contours appear intact.
Degenerative changes in the spine.
IMPRESSION: Chronic bronchitic changes in the lungs. No evidence of active
pulmonary disease.

## 2019-06-11 ENCOUNTER — Telehealth: Payer: Self-pay

## 2019-06-11 ENCOUNTER — Ambulatory Visit: Payer: Self-pay | Admitting: *Deleted

## 2019-06-11 DIAGNOSIS — R400 Somnolence: Secondary | ICD-10-CM

## 2019-06-11 NOTE — Chronic Care Management (AMB) (Signed)
  Chronic Care Management   Outreach Note  06/11/2019 Name: Cynthia Vaughan MRN: [id number] DOB: 1941-07-01  Referred by: [name], DO Reason for referral : Chronic Care Management (Unsuccessful Outreach )   An unsuccessful telephone outreach was attempted today. The patient was referred to the case management team by for assistance with care management and care coordination.   Follow Up Plan: The patient has been provided with contact information for the care management team and has been advised to call with any health related questions or concerns.   Merlene Morse [employer] Claudio RN, BSN Nurse Case Editor, commissioning Family Practice/[facility] Care Management  [phone]) Business Mobile

## 2019-06-13 ENCOUNTER — Ambulatory Visit (INDEPENDENT_AMBULATORY_CARE_PROVIDER_SITE_OTHER): Payer: Medicare Other

## 2019-06-13 ENCOUNTER — Ambulatory Visit (INDEPENDENT_AMBULATORY_CARE_PROVIDER_SITE_OTHER): Payer: Medicare Other | Admitting: Family Medicine

## 2019-06-13 ENCOUNTER — Other Ambulatory Visit: Payer: Self-pay

## 2019-06-13 VITALS — BP 112/60 | HR 92 | Temp 98.2°F | Resp 16 | Ht 59.0 in | Wt 114.8 lb

## 2019-06-13 VITALS — BP 112/60 | HR 92 | Temp 98.2°F | Ht 59.0 in | Wt 114.8 lb

## 2019-06-13 DIAGNOSIS — N182 Chronic kidney disease, stage 2 (mild): Secondary | ICD-10-CM | POA: Diagnosis not present

## 2019-06-13 DIAGNOSIS — L84 Corns and callosities: Secondary | ICD-10-CM

## 2019-06-13 DIAGNOSIS — Z0000 Encounter for general adult medical examination without abnormal findings: Secondary | ICD-10-CM

## 2019-06-13 DIAGNOSIS — I129 Hypertensive chronic kidney disease with stage 1 through stage 4 chronic kidney disease, or unspecified chronic kidney disease: Secondary | ICD-10-CM

## 2019-06-13 DIAGNOSIS — F331 Major depressive disorder, recurrent, moderate: Secondary | ICD-10-CM

## 2019-06-13 DIAGNOSIS — F419 Anxiety disorder, unspecified: Secondary | ICD-10-CM | POA: Diagnosis not present

## 2019-06-13 DIAGNOSIS — R32 Unspecified urinary incontinence: Secondary | ICD-10-CM

## 2019-06-13 DIAGNOSIS — E782 Mixed hyperlipidemia: Secondary | ICD-10-CM | POA: Diagnosis not present

## 2019-06-13 DIAGNOSIS — E559 Vitamin D deficiency, unspecified: Secondary | ICD-10-CM | POA: Diagnosis not present

## 2019-06-13 DIAGNOSIS — R739 Hyperglycemia, unspecified: Secondary | ICD-10-CM | POA: Diagnosis not present

## 2019-06-13 LAB — MICROSCOPIC EXAMINATION
Bacteria, UA: NONE SEEN
WBC, UA: NONE SEEN /hpf (ref 0–5)

## 2019-06-13 LAB — UA/M W/RFLX CULTURE, ROUTINE
Bilirubin, UA: NEGATIVE
Glucose, UA: NEGATIVE
Leukocytes,UA: NEGATIVE
Nitrite, UA: NEGATIVE
Protein,UA: NEGATIVE
Specific Gravity, UA: 1.02 (ref 1.005–1.030)
Urobilinogen, Ur: 1 mg/dL (ref 0.2–1.0)
pH, UA: 7 (ref 5.0–7.5)

## 2019-06-13 LAB — MICROALBUMIN, URINE WAIVED
Creatinine, Urine Waived: 100 mg/dL (ref 10–300)
Microalb, Ur Waived: 30 mg/L — ABNORMAL HIGH (ref 0–19)
Microalb/Creat Ratio: <30 mg/g (ref <30)

## 2019-06-13 LAB — BAYER DCA HB A1C WAIVED: HB A1C (BAYER DCA - WAIVED): 5.8 % (ref <7.0)

## 2019-06-13 MED ORDER — GABAPENTIN 100 MG PO CAPS: 100.0000 mg | ORAL_CAPSULE | Freq: Three times a day (TID) | ORAL | 0 refills | Status: DC

## 2019-06-13 MED ORDER — AMLODIPINE BESYLATE 5 MG PO TABS: 5.0000 mg | ORAL_TABLET | Freq: Every day | ORAL | 1 refills | Status: DC

## 2019-06-13 NOTE — Progress Notes (Signed)
BP 112/60   Pulse 92   Temp 98.2 F (36.8 C) (Temporal)   Ht 4\' 11"  (1.499 m)   Wt 114 lb 12.8 oz (52.1 kg)   LMP  (LMP Unknown)   BMI 23.19 kg/m    Subjective:    Patient ID: Cynthia Vaughan, female    DOB: 1940/10/05, 78 y.o.   MRN: [id number]  HPI: Cynthia Vaughan is a 78 y.o. female  Chief Complaint  Patient presents with  . Callouses  . Hypertension  . Hyperlipidemia  . Depression   Has a callous on the side of her foot that has been starting to pull off and it is painful. She would like to have it removed.   HYPERTENSION / HYPERLIPIDEMIA Satisfied with current treatment? yes Duration of hypertension: chronic BP monitoring frequency: not checking BP medication side effects: no Past BP meds: losartan, amlodipine Duration of hyperlipidemia: chronic Cholesterol medication side effects: no Cholesterol supplements: none Past cholesterol medications: atorvastatin Medication compliance: excellent compliance Aspirin: no Recent stressors: no Recurrent headaches: no Visual changes: no Palpitations: no Dyspnea: no Chest pain: no Lower extremity edema: no Dizzy/lightheaded: no  DEPRESSION Mood status: better Satisfied with current treatment?: yes Symptom severity: mild  Duration of current treatment : chronic Side effects: no Medication compliance: excellent compliance Psychotherapy/counseling: no  Previous psychiatric medications: wellbutrin, cymablta Depressed mood: yes Anxious mood: yes Anhedonia: no Significant weight loss or gain: no Insomnia: no  Fatigue: yes Feelings of worthlessness or guilt: no Impaired concentration/indecisiveness: no Suicidal ideations: no Hopelessness: no Crying spells: no Depression screen [facility] 2/9 06/13/2019 06/13/2019 03/08/2019 02/14/2019 12/04/2018  Decreased Interest 0 0 0 3 0  Down, Depressed, Hopeless 0 0 0 1 1  PHQ - 2 Score 0 0 0 4 1  Altered sleeping 3 - 3 2 0  Tired, decreased energy 3 - 1 2 2   Change in  appetite 0 - 1 2 1   Feeling bad or failure about yourself  0 - 0 1 1  Trouble concentrating 1 - 0 1 2  Moving slowly or fidgety/restless 0 - 0 0 0  Suicidal thoughts 0 - 0 0 0  PHQ-9 Score 7 - 5 12 7   Difficult doing work/chores Somewhat difficult - Not difficult at all Extremely dIfficult Somewhat difficult  Some recent data might be hidden    Relevant past medical, surgical, family and social history reviewed and updated as indicated. Interim medical history since our last visit reviewed. Allergies and medications reviewed and updated.  Review of Systems  Constitutional: Negative.   Respiratory: Negative.   Cardiovascular: Negative.   Musculoskeletal: Negative.   Skin: Positive for wound. Negative for color change, pallor and rash.  Neurological: Negative.   Psychiatric/Behavioral: Negative.     Per HPI unless specifically indicated above     Objective:    BP 112/60   Pulse 92   Temp 98.2 F (36.8 C) (Temporal)   Ht 4\' 11"  (1.499 m)   Wt 114 lb 12.8 oz (52.1 kg)   LMP  (LMP Unknown)   BMI 23.19 kg/m   Wt Readings from Last 3 Encounters:  06/13/19 114 lb 12.8 oz (52.1 kg)  06/13/19 114 lb 12.8 oz (52.1 kg)  03/08/19 115 lb 6.4 oz (52.3 kg)    Physical Exam Vitals signs and nursing note reviewed.  Constitutional:      General: She is not in acute distress.    Appearance: Normal appearance. She is not ill-appearing, toxic-appearing or diaphoretic.  HENT:  Head: Normocephalic and atraumatic.     Right Ear: External ear normal.     Left Ear: External ear normal.     Nose: Nose normal.     Mouth/Throat:     Mouth: Mucous membranes are moist.     Pharynx: Oropharynx is clear.  Eyes:     General: No scleral icterus.       Right eye: No discharge.        Left eye: No discharge.     Extraocular Movements: Extraocular movements intact.     Conjunctiva/sclera: Conjunctivae normal.     Pupils: Pupils are equal, round, and reactive to light.  Neck:      Musculoskeletal: Normal range of motion and neck supple.  Cardiovascular:     Rate and Rhythm: Normal rate and regular rhythm.     Pulses: Normal pulses.     Heart sounds: Normal heart sounds. No murmur. No friction rub. No gallop.   Pulmonary:     Effort: Pulmonary effort is normal. No respiratory distress.     Breath sounds: Normal breath sounds. No stridor. No wheezing, rhonchi or rales.  Chest:     Chest wall: No tenderness.  Musculoskeletal: Normal range of motion.  Skin:    General: Skin is warm and dry.     Capillary Refill: Capillary refill takes less than 2 seconds.     Coloration: Skin is not jaundiced or pale.     Findings: No bruising, erythema, lesion or rash.     Comments: Callous on lateral side of R heel, partially falling off  Neurological:     General: No focal deficit present.     Mental Status: She is alert and oriented to person, place, and time. Mental status is at baseline.  Psychiatric:        Mood and Affect: Mood normal.        Behavior: Behavior normal.        Thought Content: Thought content normal.        Judgment: Judgment normal.     Results for orders placed or performed in visit on 06/13/19  Microscopic Examination   BLD  Result Value Ref Range   WBC, UA None seen 0 - 5 /hpf   RBC 0-2 0 - 2 /hpf   Epithelial Cells (non renal) 0-10 0 - 10 /hpf   Casts Present None seen /lpf   Cast Type Granular casts (A) N/A   Crystals Present N/A   Crystal Type Amorphous Sediment N/A   Bacteria, UA None seen None seen/Few  Bayer DCA Hb A1c Waived  Result Value Ref Range   HB A1C (BAYER DCA - WAIVED) 5.8 <7.0 %  CBC with Differential/Platelet  Result Value Ref Range   WBC 6.5 3.4 - 10.8 x10E3/uL   RBC 4.21 3.77 - 5.28 x10E6/uL   Hemoglobin 13.4 11.1 - 15.9 g/dL   Hematocrit 39.6 34.0 - 46.6 %   MCV 94 79 - 97 fL   MCH 31.8 26.6 - 33.0 pg   MCHC 33.8 31.5 - 35.7 g/dL   RDW 11.9 11.7 - 15.4 %   Platelets 197 150 - 450 x10E3/uL   Neutrophils 61 Not  Estab. %   Lymphs 28 Not Estab. %   Monocytes 8 Not Estab. %   Eos 2 Not Estab. %   Basos 1 Not Estab. %   Neutrophils Absolute 4.0 1.4 - 7.0 x10E3/uL   Lymphocytes Absolute 1.8 0.7 - 3.1 x10E3/uL   Monocytes Absolute 0.5  0.1 - 0.9 x10E3/uL   EOS (ABSOLUTE) 0.1 0.0 - 0.4 x10E3/uL   Basophils Absolute 0.0 0.0 - 0.2 x10E3/uL   Immature Granulocytes 0 Not Estab. %   Immature Grans (Abs) 0.0 0.0 - 0.1 x10E3/uL  Comprehensive metabolic panel  Result Value Ref Range   Glucose 104 (H) 65 - 99 mg/dL   BUN 16 8 - 27 mg/dL   Creatinine, Ser 0.83 0.57 - 1.00 mg/dL   GFR calc non Af Amer 68 >59 mL/min/1.73   GFR calc Af Amer 79 >59 mL/min/1.73   BUN/Creatinine Ratio 19 12 - 28   Sodium 143 134 - 144 mmol/L   Potassium 4.7 3.5 - 5.2 mmol/L   Chloride 101 96 - 106 mmol/L   CO2 26 20 - 29 mmol/L   Calcium 9.5 8.7 - 10.3 mg/dL   Total Protein 6.8 6.0 - 8.5 g/dL   Albumin 4.3 3.7 - 4.7 g/dL   Globulin, Total 2.5 1.5 - 4.5 g/dL   Albumin/Globulin Ratio 1.7 1.2 - 2.2   Bilirubin Total 0.2 0.0 - 1.2 mg/dL   Alkaline Phosphatase 109 39 - 117 IU/L   AST 22 0 - 40 IU/L   ALT 26 0 - 32 IU/L  Lipid Panel w/o Chol/HDL Ratio  Result Value Ref Range   Cholesterol, Total 188 100 - 199 mg/dL   Triglycerides 80 0 - 149 mg/dL   HDL 79 >39 mg/dL   VLDL Cholesterol Cal 15 5 - 40 mg/dL   LDL Chol Calc (NIH) 94 0 - 99 mg/dL  Microalbumin, Urine Waived  Result Value Ref Range   Microalb, Ur Waived 30 (H) 0 - 19 mg/L   Creatinine, Urine Waived 100 10 - 300 mg/dL   Microalb/Creat Ratio <30 <30 mg/g  TSH  Result Value Ref Range   TSH 2.870 0.450 - 4.500 uIU/mL  UA/M w/rflx Culture, Routine   Specimen: Blood   BLD  Result Value Ref Range   Specific Gravity, UA 1.020 1.005 - 1.030   pH, UA 7.0 5.0 - 7.5   Color, UA Yellow Yellow   Appearance Ur Clear Clear   Leukocytes,UA Negative Negative   Protein,UA Negative Negative/Trace   Glucose, UA Negative Negative   Ketones, UA Trace (A) Negative   RBC,  UA Trace (A) Negative   Bilirubin, UA Negative Negative   Urobilinogen, Ur 1.0 0.2 - 1.0 mg/dL   Nitrite, UA Negative Negative   Microscopic Examination See below:   VITAMIN D 25 Hydroxy (Vit-D Deficiency, Fractures)  Result Value Ref Range   Vit D, 25-Hydroxy 37.7 30.0 - 100.0 ng/mL      Assessment & Plan:   Problem List Items Addressed This Visit      Genitourinary   Benign hypertensive renal disease - Primary    Under good control on current regimen. Continue current regimen. Continue to monitor. Call with any concerns. Refills given. Labs drawn today.        Relevant Orders   Bayer DCA Hb A1c Waived (Completed)   CBC with Differential/Platelet (Completed)   Comprehensive metabolic panel (Completed)   Microalbumin, Urine Waived (Completed)   TSH (Completed)   CKD (chronic kidney disease) stage 2, GFR 60-89 ml/min    Labs drawn today. Await results. Continue to monitor.       Relevant Orders   Bayer DCA Hb A1c Waived (Completed)   CBC with Differential/Platelet (Completed)   Comprehensive metabolic panel (Completed)   Microalbumin, Urine Waived (Completed)   TSH (Completed)  Other   Anxiety    Doing better. Continue to follow with psychiatry. Continue to monitor. Call with any concerns.       Relevant Orders   Bayer DCA Hb A1c Waived (Completed)   CBC with Differential/Platelet (Completed)   Comprehensive metabolic panel (Completed)   TSH (Completed)   Hyperlipidemia    Under good control on current regimen. Continue current regimen. Continue to monitor. Call with any concerns. Refills given. Labs drawn today.        Relevant Medications   amLODipine (NORVASC) 5 MG tablet   Other Relevant Orders   Bayer DCA Hb A1c Waived (Completed)   CBC with Differential/Platelet (Completed)   Comprehensive metabolic panel (Completed)   Lipid Panel w/o Chol/HDL Ratio (Completed)   TSH (Completed)   Urinary incontinence    Checking urine today. Await results.        Relevant Orders   Bayer DCA Hb A1c Waived (Completed)   CBC with Differential/Platelet (Completed)   Comprehensive metabolic panel (Completed)   TSH (Completed)   UA/M w/rflx Culture, Routine (Completed)   Vitamin D deficiency disease    Rechecking levels today. Await results. Treat as needed.       Relevant Orders   Bayer DCA Hb A1c Waived (Completed)   CBC with Differential/Platelet (Completed)   Comprehensive metabolic panel (Completed)   TSH (Completed)   VITAMIN D 25 Hydroxy (Vit-D Deficiency, Fractures) (Completed)   MDD (major depressive disorder), recurrent episode, moderate ([place])    Doing better. Continue to follow with psychiatry. Continue to monitor. Call with any concerns.       Relevant Orders   Bayer DCA Hb A1c Waived (Completed)   CBC with Differential/Platelet (Completed)   Comprehensive metabolic panel (Completed)   TSH (Completed)   RESOLVED: Depression   Relevant Orders   Bayer DCA Hb A1c Waived (Completed)   CBC with Differential/Platelet (Completed)   Comprehensive metabolic panel (Completed)   TSH (Completed)    Other Visit Diagnoses    Hard corn       Removed today as below. Return in 1 week for wound check.       Skin Procedure  Procedure: Informed consent given.  Sterile prep of the area.  Area infiltrated with lidocaine with epinephrine.  Using a surgical blade, callous removed.  Area cauterized. Pt ed on scarring.   Diagnosis:   ICD-10-CM   1. Benign hypertensive renal disease  I12.9 Bayer DCA Hb A1c Waived    CBC with Differential/Platelet    Comprehensive metabolic panel    Microalbumin, Urine Waived    TSH  2. CKD (chronic kidney disease) stage 2, GFR 60-89 ml/min  N18.2 Bayer DCA Hb A1c Waived    CBC with Differential/Platelet    Comprehensive metabolic panel    Microalbumin, Urine Waived    TSH  3. Anxiety  F41.9 Bayer DCA Hb A1c Waived    CBC with Differential/Platelet    Comprehensive metabolic panel    TSH  4. Moderate  episode of recurrent major depressive disorder (HCC)  F33.1 Bayer DCA Hb A1c Waived    CBC with Differential/Platelet    Comprehensive metabolic panel    TSH  5. Mixed hyperlipidemia  E78.2 Bayer DCA Hb A1c Waived    CBC with Differential/Platelet    Comprehensive metabolic panel    Lipid Panel w/o Chol/HDL Ratio    TSH  6. Vitamin D deficiency disease  E55.9 Bayer DCA Hb A1c Waived    CBC with Differential/Platelet  Comprehensive metabolic panel    TSH    VITAMIN D 25 Hydroxy (Vit-D Deficiency, Fractures)  7. Urinary incontinence, unspecified type  R32 Bayer DCA Hb A1c Waived    CBC with Differential/Platelet    Comprehensive metabolic panel    TSH    UA/M w/rflx Culture, Routine  8. MDD (major depressive disorder), recurrent episode, moderate (HCC)  F33.1 Bayer DCA Hb A1c Waived    CBC with Differential/Platelet    Comprehensive metabolic panel    TSH  9. Hard corn  L84    Removed today as below. Return in 1 week for wound check.     Lesion Location/Size: 1.5cm thickened callous- partially avulsed Physician: MPJ Consent:  Risks, benefits, and alternative treatments discussed and all questions were answered.  Patient elected to proceed and verbal consent obtained.  Description: Area prepped and draped using semi-sterile technique. Area locally anesthetized using 3 cc's of lidocaine 1% with epi. Callous removed using a #15 blade scalpel.  Adequate hemostastis achieved using Silver Nitrate. Wound dressed after application of bacitracin.   Post Procedure Instructions: Wound care instructions discussed and patient was instructed to keep area clean and dry.  Signs and symptoms of infection discussed, patient agrees to contact the office ASAP should they occur.  Dressing change recommended every other day.    Follow up plan: Return 1 week wound check.

## 2019-06-13 NOTE — Progress Notes (Signed)
Subjective:   Cynthia Vaughan is a 78 y.o. female who presents for Medicare Annual (Subsequent) preventive examination.  Review of Systems:   Cardiac Risk Factors include: advanced age (>51mn, >>7women);dyslipidemia     Objective:     Vitals: BP 112/60 (BP Location: Right Arm, Patient Position: Sitting, Cuff Size: Normal)   Pulse 92   Temp 98.2 F (36.8 C) (Temporal)   Resp 16   Ht _0  (1.499 m)   Wt 114 lb 12.8 oz (52.1 kg)   LMP  (LMP Unknown)   BMI 23.19 kg/m   Body mass index is 23.19 kg/m.  Advanced Directives 12/18/2017 04/20/2017 02/18/2016  Does Patient Have a Medical Advance Directive? No No No  Would patient like information on creating a medical advance directive? No - Patient declined No - Patient declined -  Some encounter information is confidential and restricted. Go to Review Flowsheets activity to see all data.    Tobacco Social History   Tobacco Use  Smoking Status Former Smoker  Smokeless Tobacco Never Used  Tobacco Comment   quit 20 years ago      Counseling given: Not Answered Comment: quit 20 years ago    Clinical Intake:  Pre-visit preparation completed: Yes  Pain : No/denies pain     Nutritional Status: BMI of 19-24  Normal Nutritional Risks: None Diabetes: No  How often do you need to have someone help you when you read instructions, pamphlets, or other written materials from your doctor or pharmacy?: 1 - Never     Information entered by :: Serjio Deupree,LPN  Past Medical History:  Diagnosis Date  . Allergy   . Anxiety   . Cancer ([place]    SKIN  . CKD (chronic kidney disease) stage 2, GFR 60-89 ml/min   . Depression   . Edema    LEGS/FEET  . GERD (gastroesophageal reflux disease)   . Heart murmur   . Hyperlipidemia   . Hypertension   . Hypopotassemia   . Microalbuminuria   . Osteoarthritis   . Osteopenia   . [facility] 05/10/2016  . Urinary incontinence   . Vitamin D deficiency  disease   . Wheezing    Past Surgical History:  Procedure Laterality Date  . ABDOMINAL HYSTERECTOMY  2004   Total (due to bladder surgery)  . BLADDER SURGERY     x 2  . BUNIONECTOMY    . CATARACT EXTRACTION W/PHACO Right 11/23/2015   Procedure: CATARACT EXTRACTION PHACO AND INTRAOCULAR LENS PLACEMENT (IOC);  Surgeon: SEstill Cotta MD;  Location: [facility] ORS;  Service: Ophthalmology;  Laterality: Right;  [place]   1:30.9 AP%  26.9 CDE   42.29 flud casette lot # [id number]  exp05/31/2018  . COLONOSCOPY     Family History  Problem Relation Age of Onset  . AAA (abdominal aortic aneurysm) Mother   . Arthritis Brother   . Heart disease Brother   . Stroke Daughter   . Diabetes Son   . Stroke Son   . Seizures Son   . Diabetes Brother   . Heart disease Brother        7 total heart attacks  . Hyperlipidemia Brother   . Hypertension Brother   . Dementia Brother   . AAA (abdominal aortic aneurysm) Brother   . Stroke Son   . Heart disease Brother   . Dementia Sister    Social History   Socioeconomic History  . Marital status: Married  Spouse name: hal  . Number of children: 3  . Years of education: Not on file  . Highest education level: GED or equivalent  Occupational History  . Occupation: retired   Scientific laboratory technician  . Financial resource strain: Not hard at all  . Food insecurity    Worry: Never true    Inability: Never true  . Transportation needs    Medical: Patient refused    Non-medical: Patient refused  Tobacco Use  . Smoking status: Former Research scientist (life sciences)  . Smokeless tobacco: Never Used  . Tobacco comment: quit 20 years ago   Substance and Sexual Activity  . Alcohol use: Yes    Comment: OCCAS  . Drug use: No  . Sexual activity: Never  Lifestyle  . Physical activity    Days per week: 0 days    Minutes per session: 0 min  . Stress: To some extent  Relationships  . Social Herbalist on phone: Patient refused    Gets together: Patient refused    Attends  religious service: More than 4 times per year    Active member of club or organization: Yes    Attends meetings of clubs or organizations: More than 4 times per year    Relationship status: Married  Other Topics Concern  . Not on file  Social History Narrative  . Not on file    Outpatient Encounter Medications as of 06/13/2019  Medication Sig  . acetaminophen (TYLENOL) 500 MG tablet Take 500 mg by mouth every 6 (six) hours as needed.  [name] amLODipine (NORVASC) 10 MG tablet Take 1 tablet (10 mg total) by mouth daily.  [name] aspirin EC 81 MG tablet Take 81 mg by mouth daily.  [name] atorvastatin (LIPITOR) 40 MG tablet Take 1 tablet (40 mg total) by mouth at bedtime.  [name] buPROPion (WELLBUTRIN) 75 MG tablet Take 1 tablet (75 mg total) by mouth daily with breakfast.  . donepezil (ARICEPT) 10 MG tablet Take 1 tablet (10 mg total) by mouth at bedtime.  . DULoxetine (CYMBALTA) 30 MG capsule Take 1 capsule (30 mg total) by mouth daily.  . fluticasone (FLONASE) 50 MCG/ACT nasal spray Place 2 sprays into both nostrils daily.  [name] gabapentin (NEURONTIN) 100 MG capsule Take 1 capsule (100 mg total) by mouth 3 (three) times daily.  [name] loratadine (CLARITIN) 10 MG tablet Take 1 tablet (10 mg total) by mouth daily.  [name] losartan (COZAAR) 50 MG tablet Take 1 tablet (50 mg total) by mouth daily.  [name] omeprazole (PRILOSEC) 20 MG capsule Take 1 capsule by mouth daily.  . diclofenac sodium (VOLTAREN) 1 % GEL Apply 4 g topically 4 (four) times daily. (Patient not taking: Reported on 06/13/2019)   No facility-administered encounter medications on file as of 06/13/2019.     Activities of Daily Living In your present state of health, do you have any difficulty performing the following activities: 06/13/2019  Hearing? N  Comment no hearing aids  Vision? N  Comment eyeglasses, goes to [facility]  Difficulty concentrating or making decisions? Y  Walking or climbing stairs? N  Comment holds on.  Dressing or bathing? N   Doing errands, shopping? N  Comment family Restaurant manager, fast food and eating ? N  Using the Toilet? N  In the past six months, have you accidently leaked urine? N  Do you have problems with loss of bowel control? N  Managing your Medications? N  Managing your Finances? Y  Comment husband does  Housekeeping or managing your Housekeeping? N  Some recent data might be hidden    Patient Care Team: [name], DO as PCP - General (Family Medicine) De Hollingshead, [facility] as Pharmacist (Pharmacist) Minor, Dalbert Garnet, RN as Case Manager    Assessment:   This is a routine wellness examination for Sabel.  Exercise Activities and Dietary recommendations Exercise limited by: None identified  Goals    . "She might need help managing her medications" (pt-stated)     Current Barriers:  . Diminished mood/flat affect, as well as "sleepiness" over the past few months. Additionally, patient struggling with tremor that inhibits some activities (husband notes she cannot hold a cup of coffee) . Established with psychiatry on 02/18/2019 - d/c aripiprazole and reduced duloxetine, started bupropion. At follow up appointment on 04/01/2019, it was noted that patient was improved; reported improvement in energy/motivation, as well as improvement in mood. Denies trouble sleeping. . Today, patient's husband, [name], confirms the above information. Also notes possible improvement in tremor. Confirms neurology follow up on 05/01/2019 with Dr. Melrose Nakayama o Does note that he picked up a prescription for donepezil 10 mg daily from [facility] that was prescribed by neurology. Somewhat frustrated, as they prefer all medications come from [facility]. This was how patient accidentally took 20 mg donepezil for a while; they filled prescriptions at both [facility] and [facility] (unsure how insurance allowed this)   Pharmacist Clinical Goal(s):  [name] Over the next 90 days, patient will work with primary care provider and CCM team to  address needs related to optimized medication management  Interventions: . Husband knows to look out at upcoming [facility] fill and if donepezil is included, to not give an extra donepezil from the [facility] fill. Encouraged him to update the patient's preferred pharmacy at [facility] when they go next month  Patient Self Care Activities:  . Currently UNABLE TO independently manage health  Please see past updates related to this goal by clicking on the "Past Updates" button in the selected goal      . I am just so sleepy and I don't know why (pt-stated)     Current Barriers:  [name] Knowledge Deficits related to recent symptom of increased letharygy   Nurse Case Manager Clinical Goal(s):  [name] Over the next 90 days, patient will work with [facility] to address needs related to incresed letharygy  Interventions:  . Provided education to patient re: on CCM and what resources CCM is availible to provide    . Met with patient and her spouse in exam room. Nash Dimmer with PCP about patients needs . 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 Provided CCM contact information . Spoke with patient's spouse about how patient was feeling and recent neurology appt.  [name] Spouse states patient continues to extremely lethargic . Spouse states Home Health has started and is helpful . Spouse states Neurology ordered Psych referral but he has not heard from them,  . Left a Vm with Psych referral coordinator Jonelle Sidle  . Psych Referral coordinator called and confirmed she was able to reach spouse and schedule appointment- July 9 with Dr. Thomasena Edis @ [facility].  [name]   Patient Self Care Activities:  . Currently UNABLE TO independently maintain activities of daily living realted to increased lethargy  Please see past updates related to this goal by clicking on the "Past Updates" button in the selected goal      .  Increase water intake      Recommend drinking at least  3-4 glasses of water a day        Fall Risk: Fall Risk  06/13/2019 12/04/2018 09/12/2017 05/08/2017 04/20/2017  Falls in the past year? 0 0 No Yes No  Number falls in past yr: 0 - - 1 -  Injury with Fall? 0 - - No -  Follow up - Falls evaluation completed - - -    FALL RISK PREVENTION PERTAINING TO THE HOME:  Any stairs in or around the home? No  If so, are there any without handrails? No   Home free of loose throw rugs in walkways, pet beds, electrical cords, etc? Yes  Adequate lighting in your home to reduce risk of falls? Yes   ASSISTIVE DEVICES UTILIZED TO PREVENT FALLS:  Life alert? No  Use of a cane, walker or w/c? No  Grab bars in the bathroom? Yes  Shower chair or bench in shower? Yes  Elevated toilet seat or a handicapped toilet? No   DME ORDERS:  DME order needed?  No   TIMED UP AND GO:  Was the test performed? Yes .  Length of time to ambulate 10 feet: 10 sec.   GAIT:  Appearance of gait: Gait slow and steady without the use of an assistive device.  Education: Fall risk prevention has been discussed.  Intervention(s) required? No   DME/home health order needed?  No    Depression Screen PHQ 2/9 Scores 06/13/2019 03/08/2019 02/14/2019 12/04/2018  PHQ - 2 Score 0 0 4 1  PHQ- 9 Score - _0 Cognitive Function MMSE - Mini Mental State Exam 12/04/2018 12/18/2017  Orientation to time 3 3  Orientation to Place 5 4  Registration 3 3  Attention/ Calculation 2 5  Recall 0 0  Language- name 2 objects 2 2  Language- repeat 1 1  Language- follow 3 step command 3 3  Language- read & follow direction 1 1  Write a sentence 1 1  Copy design 1 1  Total score 22 24     6CIT Screen 06/13/2019 04/20/2017  What Year? 0 points 0 points  What month? 0 points 0 points  What time? 0 points 0 points  Count back from 20 0 points 0 points  Months in reverse 0 points 0 points  Repeat phrase 4 points 0 points  Total Score 4 0    Immunization History  Administered  Date(s) Administered  . Fluad Quad(high Dose 65+) 05/16/2019  . Influenza, High Dose Seasonal PF 06/21/2017, 06/01/2018  . Influenza,inj,Quad PF,6+ Mos 05/21/2015  . Influenza,inj,quad, With Preservative 06/22/2016  . Influenza-Unspecified 06/11/2014, 05/21/2015, 07/04/2016  . Pneumococcal Conjugate-13 07/07/2015  . Pneumococcal Polysaccharide-23 02/15/2006, 11/04/2010  . Td 02/15/2006, 06/01/2018  . Zoster 02/05/2008    Qualifies for Shingles Vaccine? Yes  Zostavax completed 2009. Due for Shingrix. Education has been provided regarding the importance of this vaccine. Pt has been advised to call insurance company to determine out of pocket expense. Advised may also receive vaccine at local pharmacy or Health Dept. Verbalized acceptance and understanding.  Tdap: up to date  Flu Vaccine: up to date   Pneumococcal Vaccine: up to date   Screening Tests Health Maintenance  Topic Date Due  . TETANUS/TDAP  06/01/2028  . INFLUENZA VACCINE  Completed  . PNA vac Low Risk Adult  Completed  . DEXA SCAN  Addressed    Cancer Screenings:  Colorectal Screening: no  longer required   Mammogram: no longer required   Bone Density:  Completed 04/24/2014  Lung Cancer Screening: (Low Dose CT Chest recommended if Age 4-80 years, 30 pack-year currently smoking OR have quit w/in 15years.) does not qualify.     Additional Screening:  Hepatitis C Screening: does not qualify  Vision Screening: Recommended annual ophthalmology exams for early detection of glaucoma and other disorders of the eye. Is the patient up to date with their annual eye exam?  Yes  Who is the provider or what is the name of the office in which the pt attends annual eye exams? [facility]  Dental Screening: Recommended annual dental exams for proper oral hygiene  Community Resource Referral:  CRR required this visit?  No       Plan:  I have personally reviewed and addressed the Medicare Annual Wellness  questionnaire and have noted the following in the patient's chart:  A. Medical and social history B. Use of alcohol, tobacco or illicit drugs  C. Current medications and supplements D. Functional ability and status E.  Nutritional status F.  Physical activity G. Advance directives H. List of other physicians I.  Hospitalizations, surgeries, and ER visits in previous 12 months J.  [place] such as hearing and vision if needed, cognitive and depression L. Referrals and appointments   In addition, I have reviewed and discussed with patient certain preventive protocols, quality metrics, and best practice recommendations. A written personalized care plan for preventive services as well as general preventive health recommendations were provided to patient.  Signed,    Bevelyn Ngo, LPN  [id number]/86/1612 Nurse Health Advisor   Nurse Notes: none

## 2019-06-13 NOTE — Patient Instructions (Signed)
[name] , Thank you for taking time to come for your Medicare Wellness Visit. I appreciate your ongoing commitment to your health goals. Please review the following plan we discussed and let me know if I can assist you in the future.   Screening recommendations/referrals: Colonoscopy: no longer required Mammogram: no longer required  Bone Density: up to date  Recommended yearly ophthalmology/optometry visit for glaucoma screening and checkup Recommended yearly dental visit for hygiene and checkup  Vaccinations: Influenza vaccine: up to date Pneumococcal vaccine: up to date Tdap vaccine: up to date Shingles vaccine: shingrix eligible     Advanced directives: Advance directive discussed with you today. I have provided a copy for you to complete at home and have notarized. Once this is complete please bring a copy in to our office so we can scan it into your chart.  Conditions/risks identified: discussed chronic care management team.   Next appointment: Follow up in one year for your annual wellness visit    Preventive Care 65 Years and Older, Female Preventive care refers to lifestyle choices and visits with your health care provider that can promote health and wellness. What does preventive care include?  A yearly physical exam. This is also called an annual well check.  Dental exams once or twice a year.  Routine eye exams. Ask your health care provider how often you should have your eyes checked.  Personal lifestyle choices, including:  Daily care of your teeth and gums.  Regular physical activity.  Eating a healthy diet.  Avoiding tobacco and drug use.  Limiting alcohol use.  Practicing safe sex.  Taking low-dose aspirin every day.  Taking vitamin and mineral supplements as recommended by your health care provider. What happens during an annual well check? The services and screenings done by your health care provider during your annual well check will depend  on your age, overall health, lifestyle risk factors, and family history of disease. Counseling  Your health care provider may ask you questions about your:  Alcohol use.  Tobacco use.  Drug use.  Emotional well-being.  Home and relationship well-being.  Sexual activity.  Eating habits.  History of falls.  Memory and ability to understand (cognition).  Work and work Statistician.  Reproductive health. Screening  You may have the following tests or measurements:  Height, weight, and BMI.  Blood pressure.  Lipid and cholesterol levels. These may be checked every 5 years, or more frequently if you are over 1 years old.  Skin check.  Lung cancer screening. You may have this screening every year starting at age 77 if you have a 30-pack-year history of smoking and currently smoke or have quit within the past 15 years.  Fecal occult blood test (FOBT) of the stool. You may have this test every year starting at age 77.  Flexible sigmoidoscopy or colonoscopy. You may have a sigmoidoscopy every 5 years or a colonoscopy every 10 years starting at age 27.  Hepatitis C blood test.  Hepatitis B blood test.  Sexually transmitted disease (STD) testing.  Diabetes screening. This is done by checking your blood sugar (glucose) after you have not eaten for a while (fasting). You may have this done every 1-3 years.  Bone density scan. This is done to screen for osteoporosis. You may have this done starting at age 77.  Mammogram. This may be done every 1-2 years. Talk to your health care provider about how often you should have regular mammograms. Talk with your health care  provider about your test results, treatment options, and if necessary, the need for more tests. Vaccines  Your health care provider may recommend certain vaccines, such as:  Influenza vaccine. This is recommended every year.  Tetanus, diphtheria, and acellular pertussis (Tdap, Td) vaccine. You may need a Td  booster every 10 years.  Zoster vaccine. You may need this after age 77.  Pneumococcal 13-valent conjugate (PCV13) vaccine. One dose is recommended after age 77.  Pneumococcal polysaccharide (PPSV23) vaccine. One dose is recommended after age 77. Talk to your health care provider about which screenings and vaccines you need and how often you need them. This information is not intended to replace advice given to you by your health care provider. Make sure you discuss any questions you have with your health care provider. Document Released: 09/04/2015 Document Revised: 04/27/2016 Document Reviewed: 06/09/2015 Elsevier Interactive Patient Education  2017 [place] Prevention in the Home Falls can cause injuries. They can happen to people of all ages. There are many things you can do to make your home safe and to help prevent falls. What can I do on the outside of my home?  Regularly fix the edges of walkways and driveways and fix any cracks.  Remove anything that might make you trip as you walk through a door, such as a raised step or threshold.  Trim any bushes or trees on the path to your home.  Use bright outdoor lighting.  Clear any walking paths of anything that might make someone trip, such as rocks or tools.  Regularly check to see if handrails are loose or broken. Make sure that both sides of any steps have handrails.  Any raised decks and porches should have guardrails on the edges.  Have any leaves, snow, or ice cleared regularly.  Use sand or salt on walking paths during winter.  Clean up any spills in your garage right away. This includes oil or grease spills. What can I do in the bathroom?  Use night lights.  Install grab bars by the toilet and in the tub and shower. Do not use towel bars as grab bars.  Use non-skid mats or decals in the tub or shower.  If you need to sit down in the shower, use a plastic, non-slip stool.  Keep the floor dry. Clean up  any water that spills on the floor as soon as it happens.  Remove soap buildup in the tub or shower regularly.  Attach bath mats securely with double-sided non-slip rug tape.  Do not have throw rugs and other things on the floor that can make you trip. What can I do in the bedroom?  Use night lights.  Make sure that you have a light by your bed that is easy to reach.  Do not use any sheets or blankets that are too big for your bed. They should not hang down onto the floor.  Have a firm chair that has side arms. You can use this for support while you get dressed.  Do not have throw rugs and other things on the floor that can make you trip. What can I do in the kitchen?  Clean up any spills right away.  Avoid walking on wet floors.  Keep items that you use a lot in easy-to-reach places.  If you need to reach something above you, use a strong step stool that has a grab bar.  Keep electrical cords out of the way.  Do not use floor polish  or wax that makes floors slippery. If you must use wax, use non-skid floor wax.  Do not have throw rugs and other things on the floor that can make you trip. What can I do with my stairs?  Do not leave any items on the stairs.  Make sure that there are handrails on both sides of the stairs and use them. Fix handrails that are broken or loose. Make sure that handrails are as long as the stairways.  Check any carpeting to make sure that it is firmly attached to the stairs. Fix any carpet that is loose or worn.  Avoid having throw rugs at the top or bottom of the stairs. If you do have throw rugs, attach them to the floor with carpet tape.  Make sure that you have a light switch at the top of the stairs and the bottom of the stairs. If you do not have them, ask someone to add them for you. What else can I do to help prevent falls?  Wear shoes that:  Do not have high heels.  Have rubber bottoms.  Are comfortable and fit you well.  Are  closed at the toe. Do not wear sandals.  If you use a stepladder:  Make sure that it is fully opened. Do not climb a closed stepladder.  Make sure that both sides of the stepladder are locked into place.  Ask someone to hold it for you, if possible.  Clearly mark and make sure that you can see:  Any grab bars or handrails.  First and last steps.  Where the edge of each step is.  Use tools that help you move around (mobility aids) if they are needed. These include:  Canes.  Walkers.  Scooters.  Crutches.  Turn on the lights when you go into a dark area. Replace any light bulbs as soon as they burn out.  Set up your furniture so you have a clear path. Avoid moving your furniture around.  If any of your floors are uneven, fix them.  If there are any pets around you, be aware of where they are.  Review your medicines with your doctor. Some medicines can make you feel dizzy. This can increase your chance of falling. Ask your doctor what other things that you can do to help prevent falls. This information is not intended to replace advice given to you by your health care provider. Make sure you discuss any questions you have with your health care provider. Document Released: 06/04/2009 Document Revised: 01/14/2016 Document Reviewed: 09/12/2014 Elsevier Interactive Patient Education  2017 [employer].

## 2019-06-14 LAB — CBC WITH DIFFERENTIAL/PLATELET
Basophils Absolute: 0 10*3/uL (ref 0.0–0.2)
Basos: 1 %
EOS (ABSOLUTE): 0.1 10*3/uL (ref 0.0–0.4)
Eos: 2 %
Hematocrit: 39.6 % (ref 34.0–46.6)
Hemoglobin: 13.4 g/dL (ref 11.1–15.9)
Immature Grans (Abs): 0 10*3/uL (ref 0.0–0.1)
Immature Granulocytes: 0 %
Lymphocytes Absolute: 1.8 10*3/uL (ref 0.7–3.1)
Lymphs: 28 %
MCH: 31.8 pg (ref 26.6–33.0)
MCHC: 33.8 g/dL (ref 31.5–35.7)
MCV: 94 fL (ref 79–97)
Monocytes Absolute: 0.5 10*3/uL (ref 0.1–0.9)
Monocytes: 8 %
Neutrophils Absolute: 4 10*3/uL (ref 1.4–7.0)
Neutrophils: 61 %
Platelets: 197 10*3/uL (ref 150–450)
RBC: 4.21 x10E6/uL (ref 3.77–5.28)
RDW: 11.9 % (ref 11.7–15.4)
WBC: 6.5 10*3/uL (ref 3.4–10.8)

## 2019-06-14 LAB — COMPREHENSIVE METABOLIC PANEL
ALT: 26 IU/L (ref 0–32)
AST: 22 IU/L (ref 0–40)
Albumin/Globulin Ratio: 1.7 (ref 1.2–2.2)
Albumin: 4.3 g/dL (ref 3.7–4.7)
Alkaline Phosphatase: 109 IU/L (ref 39–117)
BUN/Creatinine Ratio: 19 (ref 12–28)
BUN: 16 mg/dL (ref 8–27)
Bilirubin Total: 0.2 mg/dL (ref 0.0–1.2)
CO2: 26 mmol/L (ref 20–29)
Calcium: 9.5 mg/dL (ref 8.7–10.3)
Chloride: 101 mmol/L (ref 96–106)
Creatinine, Ser: 0.83 mg/dL (ref 0.57–1.00)
GFR calc Af Amer: 79 mL/min/{1.73_m2} (ref >59)
GFR calc non Af Amer: 68 mL/min/{1.73_m2} (ref >59)
Globulin, Total: 2.5 g/dL (ref 1.5–4.5)
Glucose: 104 mg/dL — ABNORMAL HIGH (ref 65–99)
Potassium: 4.7 mmol/L (ref 3.5–5.2)
Sodium: 143 mmol/L (ref 134–144)
Total Protein: 6.8 g/dL (ref 6.0–8.5)

## 2019-06-14 LAB — LIPID PANEL W/O CHOL/HDL RATIO
Cholesterol, Total: 188 mg/dL (ref 100–199)
HDL: 79 mg/dL (ref >39)
LDL Chol Calc (NIH): 94 mg/dL (ref 0–99)
Triglycerides: 80 mg/dL (ref 0–149)
VLDL Cholesterol Cal: 15 mg/dL (ref 5–40)

## 2019-06-14 LAB — VITAMIN D 25 HYDROXY (VIT D DEFICIENCY, FRACTURES): Vit D, 25-Hydroxy: 37.7 ng/mL (ref 30.0–100.0)

## 2019-06-14 LAB — TSH: TSH: 2.87 u[IU]/mL (ref 0.450–4.500)

## 2019-06-16 ENCOUNTER — Encounter: Payer: Self-pay | Admitting: Family Medicine

## 2019-06-16 NOTE — Assessment & Plan Note (Signed)
Under good control on current regimen. Continue current regimen. Continue to monitor. Call with any concerns. Refills given. Labs drawn today.   

## 2019-06-16 NOTE — Assessment & Plan Note (Signed)
Labs drawn today. Await results. Continue to monitor.  

## 2019-06-16 NOTE — Assessment & Plan Note (Signed)
Checking urine today. Await results.  

## 2019-06-16 NOTE — Assessment & Plan Note (Signed)
Rechecking levels today. Await results. Treat as needed.  

## 2019-06-16 NOTE — Assessment & Plan Note (Signed)
Doing better. Continue to follow with psychiatry. Continue to monitor. Call with any concerns.

## 2019-06-21 ENCOUNTER — Ambulatory Visit: Payer: Medicare Other | Admitting: Family Medicine

## 2019-07-07 ENCOUNTER — Other Ambulatory Visit: Payer: Self-pay | Admitting: Psychiatry

## 2019-07-07 DIAGNOSIS — F331 Major depressive disorder, recurrent, moderate: Secondary | ICD-10-CM

## 2019-07-08 DIAGNOSIS — R251 Tremor, unspecified: Secondary | ICD-10-CM | POA: Diagnosis not present

## 2019-07-08 DIAGNOSIS — R202 Paresthesia of skin: Secondary | ICD-10-CM | POA: Diagnosis not present

## 2019-07-08 DIAGNOSIS — R200 Anesthesia of skin: Secondary | ICD-10-CM | POA: Diagnosis not present

## 2019-07-08 DIAGNOSIS — R413 Other amnesia: Secondary | ICD-10-CM | POA: Diagnosis not present

## 2019-08-02 ENCOUNTER — Other Ambulatory Visit: Payer: Self-pay | Admitting: Family Medicine

## 2019-09-30 ENCOUNTER — Other Ambulatory Visit: Payer: Self-pay | Admitting: Family Medicine

## 2019-09-30 ENCOUNTER — Other Ambulatory Visit: Payer: Self-pay | Admitting: Psychiatry

## 2019-09-30 DIAGNOSIS — F331 Major depressive disorder, recurrent, moderate: Secondary | ICD-10-CM

## 2019-10-30 ENCOUNTER — Other Ambulatory Visit: Payer: Self-pay | Admitting: Family Medicine

## 2019-10-30 NOTE — Telephone Encounter (Signed)
Requested Prescriptions  Pending Prescriptions Disp Refills  . fluticasone (FLONASE) 50 MCG/ACT nasal spray [Pharmacy Med Name: Fluticasone Propionate 22mcg/actuation Nasal Spray] 16 g     Sig: Instill 2 sprays in each nostril daily.     Ear, Nose, and Throat: Nasal Preparations - Corticosteroids Passed - 10/30/2019  6:09 AM      Passed - Valid encounter within last 12 months    Recent Outpatient Visits          4 months ago Benign hypertensive renal disease   [place] [facility] [place], Megan P, DO   7 months ago Moderate episode of recurrent major depressive disorder ([place])   [place], Megan P, DO   8 months ago Moderate episode of recurrent major depressive disorder ([place])   [place], Megan P, DO   9 months ago Somnolence   [place], DO   9 months ago [place], Megan P, DO      Future Appointments            In 7 months [employer] [employer]

## 2019-11-29 ENCOUNTER — Other Ambulatory Visit: Payer: Self-pay | Admitting: Family Medicine

## 2019-12-02 DIAGNOSIS — R413 Other amnesia: Secondary | ICD-10-CM | POA: Diagnosis not present

## 2019-12-02 DIAGNOSIS — R251 Tremor, unspecified: Secondary | ICD-10-CM | POA: Diagnosis not present

## 2019-12-02 DIAGNOSIS — G20 Parkinson's disease: Secondary | ICD-10-CM | POA: Diagnosis not present

## 2019-12-16 ENCOUNTER — Telehealth: Payer: Self-pay | Admitting: Family Medicine

## 2019-12-16 NOTE — Telephone Encounter (Signed)
Pt presented in office stating that pillpack [facility] advised her that we needed to contact them in regards to refills.

## 2019-12-17 ENCOUNTER — Other Ambulatory Visit: Payer: Self-pay | Admitting: Family Medicine

## 2019-12-17 DIAGNOSIS — F331 Major depressive disorder, recurrent, moderate: Secondary | ICD-10-CM

## 2019-12-17 MED ORDER — ATORVASTATIN CALCIUM 40 MG PO TABS: 40.0000 mg | ORAL_TABLET | Freq: Every day | ORAL | 0 refills | Status: DC

## 2019-12-17 MED ORDER — LORATADINE 10 MG PO TABS: 10.0000 mg | ORAL_TABLET | Freq: Every day | ORAL | 0 refills | Status: DC

## 2019-12-17 MED ORDER — GABAPENTIN 100 MG PO CAPS: 100.0000 mg | ORAL_CAPSULE | Freq: Three times a day (TID) | ORAL | 0 refills | Status: DC

## 2019-12-17 MED ORDER — AMLODIPINE BESYLATE 5 MG PO TABS: 5.0000 mg | ORAL_TABLET | Freq: Every day | ORAL | 0 refills | Status: DC

## 2019-12-17 MED ORDER — LOSARTAN POTASSIUM 50 MG PO TABS: 50.0000 mg | ORAL_TABLET | Freq: Every day | ORAL | 0 refills | Status: DC

## 2019-12-17 MED ORDER — OMEPRAZOLE 20 MG PO CPDR: 20.0000 mg | DELAYED_RELEASE_CAPSULE | Freq: Every day | ORAL | 0 refills | Status: DC

## 2019-12-17 MED ORDER — DONEPEZIL HCL 10 MG PO TABS: 10.0000 mg | ORAL_TABLET | Freq: Every day | ORAL | 0 refills | Status: DC

## 2019-12-17 MED ORDER — DULOXETINE HCL 30 MG PO CPEP: 30.0000 mg | ORAL_CAPSULE | Freq: Every day | ORAL | 0 refills | Status: DC

## 2019-12-17 MED ORDER — BUPROPION HCL 75 MG PO TABS: 75.0000 mg | ORAL_TABLET | Freq: Every day | ORAL | 0 refills | Status: DC

## 2019-12-17 NOTE — Telephone Encounter (Signed)
Called and spoke with patient's husband, all medications need to have refills sent to [facility] so that her next delivery is not late.  Follow up appt scheduled for May 10

## 2019-12-29 ENCOUNTER — Other Ambulatory Visit: Payer: Self-pay | Admitting: Family Medicine

## 2019-12-29 NOTE — Telephone Encounter (Signed)
Requested Prescriptions  Pending Prescriptions Disp Refills  . omeprazole (PRILOSEC) 20 MG capsule [Pharmacy Med Name: Omeprazole 20mg  Delayed-Release Capsule] 90 capsule 1    Sig: Take 1 capsule by mouth daily.     Gastroenterology: Proton Pump Inhibitors Passed - 12/29/2019  5:58 AM      Passed - Valid encounter within last 12 months    Recent Outpatient Visits          6 months ago Benign hypertensive renal disease   [facility] [place], Megan P, DO   9 months ago Moderate episode of recurrent major depressive disorder ([place])   [place], Megan P, DO   10 months ago Moderate episode of recurrent major depressive disorder ([place])   [place], Megan P, DO   11 months ago Somnolence   [place], DO   11 months ago [place], [place], DO      Future Appointments            Tomorrow [name], DO [place], [place]   In 5 months  [employer]

## 2019-12-30 ENCOUNTER — Other Ambulatory Visit: Payer: Self-pay

## 2019-12-30 ENCOUNTER — Ambulatory Visit (INDEPENDENT_AMBULATORY_CARE_PROVIDER_SITE_OTHER): Payer: Medicare Other | Admitting: Family Medicine

## 2019-12-30 ENCOUNTER — Encounter: Payer: Self-pay | Admitting: Family Medicine

## 2019-12-30 VITALS — BP 130/81 | HR 68 | Temp 98.0°F | Ht 59.25 in | Wt 124.0 lb

## 2019-12-30 DIAGNOSIS — R32 Unspecified urinary incontinence: Secondary | ICD-10-CM | POA: Diagnosis not present

## 2019-12-30 DIAGNOSIS — E559 Vitamin D deficiency, unspecified: Secondary | ICD-10-CM | POA: Diagnosis not present

## 2019-12-30 DIAGNOSIS — I129 Hypertensive chronic kidney disease with stage 1 through stage 4 chronic kidney disease, or unspecified chronic kidney disease: Secondary | ICD-10-CM | POA: Diagnosis not present

## 2019-12-30 DIAGNOSIS — N182 Chronic kidney disease, stage 2 (mild): Secondary | ICD-10-CM | POA: Diagnosis not present

## 2019-12-30 DIAGNOSIS — K219 Gastro-esophageal reflux disease without esophagitis: Secondary | ICD-10-CM

## 2019-12-30 DIAGNOSIS — F331 Major depressive disorder, recurrent, moderate: Secondary | ICD-10-CM

## 2019-12-30 DIAGNOSIS — F419 Anxiety disorder, unspecified: Secondary | ICD-10-CM | POA: Diagnosis not present

## 2019-12-30 DIAGNOSIS — E782 Mixed hyperlipidemia: Secondary | ICD-10-CM | POA: Diagnosis not present

## 2019-12-30 MED ORDER — ATORVASTATIN CALCIUM 40 MG PO TABS: 40.0000 mg | ORAL_TABLET | Freq: Every day | ORAL | 1 refills | Status: DC

## 2019-12-30 MED ORDER — DULOXETINE HCL 30 MG PO CPEP: 30.0000 mg | ORAL_CAPSULE | Freq: Every day | ORAL | 1 refills | Status: DC

## 2019-12-30 MED ORDER — LORATADINE 10 MG PO TABS: 10.0000 mg | ORAL_TABLET | Freq: Every day | ORAL | 3 refills | Status: DC

## 2019-12-30 MED ORDER — GABAPENTIN 100 MG PO CAPS: 100.0000 mg | ORAL_CAPSULE | Freq: Three times a day (TID) | ORAL | 1 refills | Status: DC

## 2019-12-30 MED ORDER — AMLODIPINE BESYLATE 5 MG PO TABS: 5.0000 mg | ORAL_TABLET | Freq: Every day | ORAL | 1 refills | Status: DC

## 2019-12-30 MED ORDER — DONEPEZIL HCL 10 MG PO TABS: 10.0000 mg | ORAL_TABLET | Freq: Every day | ORAL | 1 refills | Status: DC

## 2019-12-30 MED ORDER — LOSARTAN POTASSIUM 50 MG PO TABS: 50.0000 mg | ORAL_TABLET | Freq: Every day | ORAL | 1 refills | Status: DC

## 2019-12-30 MED ORDER — FLUTICASONE PROPIONATE 50 MCG/ACT NA SUSP: NASAL | 3 refills | Status: DC

## 2019-12-30 MED ORDER — OMEPRAZOLE 20 MG PO CPDR: 20.0000 mg | DELAYED_RELEASE_CAPSULE | Freq: Every day | ORAL | 1 refills | Status: DC

## 2019-12-30 MED ORDER — BUPROPION HCL 75 MG PO TABS: 75.0000 mg | ORAL_TABLET | Freq: Every day | ORAL | 1 refills | Status: DC

## 2019-12-30 NOTE — Progress Notes (Signed)
BP 130/81 (BP Location: Left Arm, Patient Position: Sitting, Cuff Size: Normal)   Pulse 68   Temp 98 F (36.7 C) (Oral)   Ht 4' 11.25" (1.505 m)   Wt 124 lb (56.2 kg)   LMP  (LMP Unknown)   SpO2 97%   BMI 24.83 kg/m    Subjective:    Patient ID: Cynthia Vaughan, female    DOB: 1941/03/29, 79 y.o.   MRN: [id number]  HPI: Cynthia Vaughan is a 79 y.o. female  Chief Complaint  Patient presents with  . Hypertension  . Hyperlipidemia  . Depression   HYPERTENSION / HYPERLIPIDEMIA Satisfied with current treatment? yes Duration of hypertension: chronic BP monitoring frequency: not checking BP medication side effects: no Past BP meds: losartan, amlodipine Duration of hyperlipidemia: chronic Cholesterol medication side effects: no Cholesterol supplements: none Past cholesterol medications: atorvastatin Medication compliance: excellent compliance Aspirin: yes Recent stressors: no Recurrent headaches: no Visual changes: no Palpitations: no Dyspnea: no Chest pain: no Lower extremity edema: no Dizzy/lightheaded: no  DEPRESSION Mood status: controlled Satisfied with current treatment?: yes Symptom severity: mild  Duration of current treatment : chronic Side effects: no Medication compliance: excellent compliance Psychotherapy/counseling: no  Previous psychiatric medications: cymbalta and wellbutrin Depressed mood: no Anxious mood: no Anhedonia: no Significant weight loss or gain: no Insomnia: no  Fatigue: no Feelings of worthlessness or guilt: no Impaired concentration/indecisiveness: no Suicidal ideations: no Hopelessness: no Crying spells: no Depression screen [facility] 2/9 12/30/2019 06/13/2019 06/13/2019 03/08/2019 02/14/2019  Decreased Interest 0 0 0 0 3  Down, Depressed, Hopeless 0 0 0 0 1  PHQ - 2 Score 0 0 0 0 4  Altered sleeping 0 3 - 3 2  Tired, decreased energy 0 3 - 1 2  Change in appetite 0 0 - 1 2  Feeling bad or failure about yourself  0 0 - 0 1    Trouble concentrating 0 1 - 0 1  Moving slowly or fidgety/restless 0 0 - 0 0  Suicidal thoughts 0 0 - 0 0  PHQ-9 Score 0 7 - 5 12  Difficult doing work/chores Not difficult at all Somewhat difficult - Not difficult at all Extremely dIfficult  Some recent data might be hidden    Relevant past medical, surgical, family and social history reviewed and updated as indicated. Interim medical history since our last visit reviewed. Allergies and medications reviewed and updated.  Review of Systems  Constitutional: Negative.   Respiratory: Negative.   Cardiovascular: Negative.   Gastrointestinal: Negative.   Musculoskeletal: Negative.   Neurological: Negative.   Psychiatric/Behavioral: Negative.     Per HPI unless specifically indicated above     Objective:    BP 130/81 (BP Location: Left Arm, Patient Position: Sitting, Cuff Size: Normal)   Pulse 68   Temp 98 F (36.7 C) (Oral)   Ht 4' 11.25" (1.505 m)   Wt 124 lb (56.2 kg)   LMP  (LMP Unknown)   SpO2 97%   BMI 24.83 kg/m   Wt Readings from Last 3 Encounters:  12/30/19 124 lb (56.2 kg)  06/13/19 114 lb 12.8 oz (52.1 kg)  06/13/19 114 lb 12.8 oz (52.1 kg)    Physical Exam Vitals and nursing note reviewed.  Constitutional:      General: She is not in acute distress.    Appearance: Normal appearance. She is not ill-appearing, toxic-appearing or diaphoretic.  HENT:     Head: Normocephalic and atraumatic.     Right Ear: External  ear normal.     Left Ear: External ear normal.     Nose: Nose normal.     Mouth/Throat:     Mouth: Mucous membranes are moist.     Pharynx: Oropharynx is clear.  Eyes:     General: No scleral icterus.       Right eye: No discharge.        Left eye: No discharge.     Extraocular Movements: Extraocular movements intact.     Conjunctiva/sclera: Conjunctivae normal.     Pupils: Pupils are equal, round, and reactive to light.  Cardiovascular:     Rate and Rhythm: Normal rate and regular rhythm.      Pulses: Normal pulses.     Heart sounds: Normal heart sounds. No murmur. No friction rub. No gallop.   Pulmonary:     Effort: Pulmonary effort is normal. No respiratory distress.     Breath sounds: Normal breath sounds. No stridor. No wheezing, rhonchi or rales.  Chest:     Chest wall: No tenderness.  Musculoskeletal:        General: Normal range of motion.     Cervical back: Normal range of motion and neck supple.  Skin:    General: Skin is warm and dry.     Capillary Refill: Capillary refill takes less than 2 seconds.     Coloration: Skin is not jaundiced or pale.     Findings: No bruising, erythema, lesion or rash.  Neurological:     General: No focal deficit present.     Mental Status: She is alert and oriented to person, place, and time. Mental status is at baseline.  Psychiatric:        Mood and Affect: Mood normal.        Behavior: Behavior normal.        Thought Content: Thought content normal.        Judgment: Judgment normal.     Results for orders placed or performed in visit on 06/13/19  Microscopic Examination   BLD  Result Value Ref Range   WBC, UA None seen 0 - 5 /hpf   RBC 0-2 0 - 2 /hpf   Epithelial Cells (non renal) 0-10 0 - 10 /hpf   Casts Present None seen /lpf   Cast Type Granular casts (A) N/A   Crystals Present N/A   Crystal Type Amorphous Sediment N/A   Bacteria, UA None seen None seen/Few  Bayer DCA Hb A1c Waived  Result Value Ref Range   HB A1C (BAYER DCA - WAIVED) 5.8 <7.0 %  CBC with Differential/Platelet  Result Value Ref Range   WBC 6.5 3.4 - 10.8 x10E3/uL   RBC 4.21 3.77 - 5.28 x10E6/uL   Hemoglobin 13.4 11.1 - 15.9 g/dL   Hematocrit 39.6 34.0 - 46.6 %   MCV 94 79 - 97 fL   MCH 31.8 26.6 - 33.0 pg   MCHC 33.8 31.5 - 35.7 g/dL   RDW 11.9 11.7 - 15.4 %   Platelets 197 150 - 450 x10E3/uL   Neutrophils 61 Not Estab. %   Lymphs 28 Not Estab. %   Monocytes 8 Not Estab. %   Eos 2 Not Estab. %   Basos 1 Not Estab. %   Neutrophils  Absolute 4.0 1.4 - 7.0 x10E3/uL   Lymphocytes Absolute 1.8 0.7 - 3.1 x10E3/uL   Monocytes Absolute 0.5 0.1 - 0.9 x10E3/uL   EOS (ABSOLUTE) 0.1 0.0 - 0.4 x10E3/uL   Basophils Absolute 0.0 0.0 -  0.2 x10E3/uL   Immature Granulocytes 0 Not Estab. %   Immature Grans (Abs) 0.0 0.0 - 0.1 x10E3/uL  Comprehensive metabolic panel  Result Value Ref Range   Glucose 104 (H) 65 - 99 mg/dL   BUN 16 8 - 27 mg/dL   Creatinine, Ser 0.83 0.57 - 1.00 mg/dL   GFR calc non Af Amer 68 >59 mL/min/1.73   GFR calc Af Amer 79 >59 mL/min/1.73   BUN/Creatinine Ratio 19 12 - 28   Sodium 143 134 - 144 mmol/L   Potassium 4.7 3.5 - 5.2 mmol/L   Chloride 101 96 - 106 mmol/L   CO2 26 20 - 29 mmol/L   Calcium 9.5 8.7 - 10.3 mg/dL   Total Protein 6.8 6.0 - 8.5 g/dL   Albumin 4.3 3.7 - 4.7 g/dL   Globulin, Total 2.5 1.5 - 4.5 g/dL   Albumin/Globulin Ratio 1.7 1.2 - 2.2   Bilirubin Total 0.2 0.0 - 1.2 mg/dL   Alkaline Phosphatase 109 39 - 117 IU/L   AST 22 0 - 40 IU/L   ALT 26 0 - 32 IU/L  Lipid Panel w/o Chol/HDL Ratio  Result Value Ref Range   Cholesterol, Total 188 100 - 199 mg/dL   Triglycerides 80 0 - 149 mg/dL   HDL 79 >39 mg/dL   VLDL Cholesterol Cal 15 5 - 40 mg/dL   LDL Chol Calc (NIH) 94 0 - 99 mg/dL  Microalbumin, Urine Waived  Result Value Ref Range   Microalb, Ur Waived 30 (H) 0 - 19 mg/L   Creatinine, Urine Waived 100 10 - 300 mg/dL   Microalb/Creat Ratio <30 <30 mg/g  TSH  Result Value Ref Range   TSH 2.870 0.450 - 4.500 uIU/mL  UA/M w/rflx Culture, Routine   Specimen: Blood   BLD  Result Value Ref Range   Specific Gravity, UA 1.020 1.005 - 1.030   pH, UA 7.0 5.0 - 7.5   Color, UA Yellow Yellow   Appearance Ur Clear Clear   Leukocytes,UA Negative Negative   Protein,UA Negative Negative/Trace   Glucose, UA Negative Negative   Ketones, UA Trace (A) Negative   RBC, UA Trace (A) Negative   Bilirubin, UA Negative Negative   Urobilinogen, Ur 1.0 0.2 - 1.0 mg/dL   Nitrite, UA Negative  Negative   Microscopic Examination See below:   VITAMIN D 25 Hydroxy (Vit-D Deficiency, Fractures)  Result Value Ref Range   Vit D, 25-Hydroxy 37.7 30.0 - 100.0 ng/mL      Assessment & Plan:   Problem List Items Addressed This Visit      Digestive   GERD (gastroesophageal reflux disease)    Under good control on current regimen. Continue current regimen. Continue to monitor. Call with any concerns. Refills given. Labs done today.       Relevant Medications   omeprazole (PRILOSEC) 20 MG capsule   Other Relevant Orders   CBC with Differential/Platelet   Comprehensive metabolic panel     Genitourinary   Benign hypertensive renal disease - Primary    Under good control on current regimen. Continue current regimen. Continue to monitor. Call with any concerns. Refills given. Labs done today.      Relevant Orders   Comprehensive metabolic panel   Microalbumin, Urine Waived   TSH   CKD (chronic kidney disease) stage 2, GFR 60-89 ml/min    Rechecking labs today. Await results. Treat as needed.       Relevant Orders   Comprehensive metabolic panel  Other   Anxiety    Under good control on current regimen. Continue current regimen. Continue to monitor. Call with any concerns. Refills given. Labs done today.      Relevant Medications   buPROPion (WELLBUTRIN) 75 MG tablet   DULoxetine (CYMBALTA) 30 MG capsule   Other Relevant Orders   Comprehensive metabolic panel   Hyperlipidemia   Relevant Medications   amLODipine (NORVASC) 5 MG tablet   atorvastatin (LIPITOR) 40 MG tablet   losartan (COZAAR) 50 MG tablet   Other Relevant Orders   Comprehensive metabolic panel   Lipid Panel w/o Chol/HDL Ratio   Urinary incontinence    Checking UA. Await results.       Relevant Orders   Comprehensive metabolic panel   UA/M w/rflx Culture, Routine   Vitamin D deficiency disease    Rechecking labs today. Await results.       Relevant Orders   Comprehensive metabolic panel    VITAMIN D 25 Hydroxy (Vit-D Deficiency, Fractures)   MDD (major depressive disorder), recurrent episode, moderate (HCC)    Under good control on current regimen. Continue current regimen. Continue to monitor. Call with any concerns. Refills given. Labs done today.      Relevant Medications   buPROPion (WELLBUTRIN) 75 MG tablet   DULoxetine (CYMBALTA) 30 MG capsule   Other Relevant Orders   Comprehensive metabolic panel   TSH       Follow up plan: Return in about 6 months (around 07/01/2020).

## 2019-12-30 NOTE — Assessment & Plan Note (Signed)
Under good control on current regimen. Continue current regimen. Continue to monitor. Call with any concerns. Refills given. Labs done today.  

## 2019-12-30 NOTE — Assessment & Plan Note (Signed)
Checking UA. Await results.

## 2019-12-30 NOTE — Assessment & Plan Note (Signed)
Rechecking labs today. Await results. Treat as needed.  °

## 2019-12-30 NOTE — Assessment & Plan Note (Signed)
Rechecking labs today. Await results.  

## 2019-12-31 LAB — CBC WITH DIFFERENTIAL/PLATELET
Basophils Absolute: 0 10*3/uL (ref 0.0–0.2)
Basos: 1 %
EOS (ABSOLUTE): 0.2 10*3/uL (ref 0.0–0.4)
Eos: 3 %
Hematocrit: 42.7 % (ref 34.0–46.6)
Hemoglobin: 14.2 g/dL (ref 11.1–15.9)
Immature Grans (Abs): 0 10*3/uL (ref 0.0–0.1)
Immature Granulocytes: 0 %
Lymphocytes Absolute: 2.1 10*3/uL (ref 0.7–3.1)
Lymphs: 31 %
MCH: 31.5 pg (ref 26.6–33.0)
MCHC: 33.3 g/dL (ref 31.5–35.7)
MCV: 95 fL (ref 79–97)
Monocytes Absolute: 0.6 10*3/uL (ref 0.1–0.9)
Monocytes: 9 %
Neutrophils Absolute: 3.8 10*3/uL (ref 1.4–7.0)
Neutrophils: 56 %
Platelets: 198 10*3/uL (ref 150–450)
RBC: 4.51 x10E6/uL (ref 3.77–5.28)
RDW: 11.9 % (ref 11.7–15.4)
WBC: 6.7 10*3/uL (ref 3.4–10.8)

## 2019-12-31 LAB — COMPREHENSIVE METABOLIC PANEL
ALT: 24 IU/L (ref 0–32)
AST: 23 IU/L (ref 0–40)
Albumin/Globulin Ratio: 1.3 (ref 1.2–2.2)
Albumin: 3.9 g/dL (ref 3.7–4.7)
Alkaline Phosphatase: 107 IU/L (ref 39–117)
BUN/Creatinine Ratio: 21 (ref 12–28)
BUN: 17 mg/dL (ref 8–27)
Bilirubin Total: 0.2 mg/dL (ref 0.0–1.2)
CO2: 25 mmol/L (ref 20–29)
Calcium: 9.4 mg/dL (ref 8.7–10.3)
Chloride: 104 mmol/L (ref 96–106)
Creatinine, Ser: 0.8 mg/dL (ref 0.57–1.00)
GFR calc Af Amer: 82 mL/min/{1.73_m2} (ref >59)
GFR calc non Af Amer: 71 mL/min/{1.73_m2} (ref >59)
Globulin, Total: 2.9 g/dL (ref 1.5–4.5)
Glucose: 102 mg/dL — ABNORMAL HIGH (ref 65–99)
Potassium: 4.4 mmol/L (ref 3.5–5.2)
Sodium: 142 mmol/L (ref 134–144)
Total Protein: 6.8 g/dL (ref 6.0–8.5)

## 2019-12-31 LAB — LIPID PANEL W/O CHOL/HDL RATIO
Cholesterol, Total: 198 mg/dL (ref 100–199)
HDL: 84 mg/dL (ref >39)
LDL Chol Calc (NIH): 99 mg/dL (ref 0–99)
Triglycerides: 85 mg/dL (ref 0–149)
VLDL Cholesterol Cal: 15 mg/dL (ref 5–40)

## 2019-12-31 LAB — TSH: TSH: 2.32 u[IU]/mL (ref 0.450–4.500)

## 2019-12-31 LAB — VITAMIN D 25 HYDROXY (VIT D DEFICIENCY, FRACTURES): Vit D, 25-Hydroxy: 34.5 ng/mL (ref 30.0–100.0)

## 2020-01-01 LAB — UA/M W/RFLX CULTURE, ROUTINE
Bilirubin, UA: NEGATIVE
Glucose, UA: NEGATIVE
Ketones, UA: NEGATIVE
Leukocytes,UA: NEGATIVE
Nitrite, UA: NEGATIVE
Protein,UA: NEGATIVE
Specific Gravity, UA: 1.02 (ref 1.005–1.030)
Urobilinogen, Ur: 1 mg/dL (ref 0.2–1.0)
pH, UA: 7 (ref 5.0–7.5)

## 2020-01-01 LAB — MICROSCOPIC EXAMINATION

## 2020-01-01 LAB — MICROALBUMIN, URINE WAIVED
Creatinine, Urine Waived: 100 mg/dL (ref 10–300)
Microalb, Ur Waived: 30 mg/L — ABNORMAL HIGH (ref 0–19)
Microalb/Creat Ratio: <30 mg/g (ref <30)

## 2020-01-01 LAB — URINE CULTURE, REFLEX

## 2020-03-09 DIAGNOSIS — R251 Tremor, unspecified: Secondary | ICD-10-CM | POA: Diagnosis not present

## 2020-03-09 DIAGNOSIS — G20 Parkinson's disease: Secondary | ICD-10-CM | POA: Diagnosis not present

## 2020-03-09 DIAGNOSIS — R202 Paresthesia of skin: Secondary | ICD-10-CM | POA: Diagnosis not present

## 2020-03-09 DIAGNOSIS — R413 Other amnesia: Secondary | ICD-10-CM | POA: Diagnosis not present

## 2020-03-09 DIAGNOSIS — R200 Anesthesia of skin: Secondary | ICD-10-CM | POA: Diagnosis not present

## 2020-03-18 DIAGNOSIS — H2512 Age-related nuclear cataract, left eye: Secondary | ICD-10-CM | POA: Diagnosis not present

## 2020-03-20 DIAGNOSIS — H2512 Age-related nuclear cataract, left eye: Secondary | ICD-10-CM | POA: Diagnosis not present

## 2020-03-28 ENCOUNTER — Other Ambulatory Visit: Payer: Self-pay | Admitting: Family Medicine

## 2020-04-27 ENCOUNTER — Other Ambulatory Visit: Payer: Self-pay | Admitting: Family Medicine

## 2020-04-28 DIAGNOSIS — H2512 Age-related nuclear cataract, left eye: Secondary | ICD-10-CM | POA: Diagnosis not present

## 2020-05-06 ENCOUNTER — Encounter: Payer: Self-pay | Admitting: Ophthalmology

## 2020-05-06 ENCOUNTER — Other Ambulatory Visit: Payer: Self-pay

## 2020-05-08 ENCOUNTER — Other Ambulatory Visit
Admission: RE | Admit: 2020-05-08 | Discharge: 2020-05-08 | Disposition: A | Discharge status: Home / Self Care | Payer: Medicare Other | Source: Ambulatory Visit | Attending: Ophthalmology | Admitting: Ophthalmology

## 2020-05-08 ENCOUNTER — Other Ambulatory Visit: Payer: Self-pay

## 2020-05-08 DIAGNOSIS — Z20822 Contact with and (suspected) exposure to covid-19: Secondary | ICD-10-CM | POA: Diagnosis not present

## 2020-05-08 DIAGNOSIS — Z01812 Encounter for preprocedural laboratory examination: Secondary | ICD-10-CM | POA: Insufficient documentation

## 2020-05-09 LAB — SARS CORONAVIRUS 2 (TAT 6-24 HRS): SARS Coronavirus 2: NEGATIVE

## 2020-05-11 NOTE — Discharge Instructions (Signed)

## 2020-05-12 ENCOUNTER — Encounter
Admission: RE | Disposition: A | Discharge status: Home / Self Care | Payer: Self-pay | Source: Home / Self Care | Attending: Ophthalmology

## 2020-05-12 ENCOUNTER — Ambulatory Visit: Payer: Medicare Other | Admitting: Anesthesiology

## 2020-05-12 ENCOUNTER — Ambulatory Visit
Admission: RE | Admit: 2020-05-12 | Discharge: 2020-05-12 | Disposition: A | Discharge status: Home / Self Care | Payer: Medicare Other | Attending: Ophthalmology | Admitting: Ophthalmology

## 2020-05-12 ENCOUNTER — Encounter: Payer: Self-pay | Admitting: Ophthalmology

## 2020-05-12 ENCOUNTER — Other Ambulatory Visit: Payer: Self-pay

## 2020-05-12 DIAGNOSIS — E7800 Pure hypercholesterolemia, unspecified: Secondary | ICD-10-CM | POA: Diagnosis not present

## 2020-05-12 DIAGNOSIS — Z885 Allergy status to narcotic agent status: Secondary | ICD-10-CM | POA: Insufficient documentation

## 2020-05-12 DIAGNOSIS — F0390 Unspecified dementia without behavioral disturbance: Secondary | ICD-10-CM | POA: Diagnosis not present

## 2020-05-12 DIAGNOSIS — F329 Major depressive disorder, single episode, unspecified: Secondary | ICD-10-CM | POA: Diagnosis not present

## 2020-05-12 DIAGNOSIS — H2512 Age-related nuclear cataract, left eye: Secondary | ICD-10-CM | POA: Insufficient documentation

## 2020-05-12 DIAGNOSIS — Z90710 Acquired absence of both cervix and uterus: Secondary | ICD-10-CM | POA: Insufficient documentation

## 2020-05-12 DIAGNOSIS — G629 Polyneuropathy, unspecified: Secondary | ICD-10-CM | POA: Insufficient documentation

## 2020-05-12 DIAGNOSIS — K219 Gastro-esophageal reflux disease without esophagitis: Secondary | ICD-10-CM | POA: Diagnosis not present

## 2020-05-12 DIAGNOSIS — I10 Essential (primary) hypertension: Secondary | ICD-10-CM | POA: Insufficient documentation

## 2020-05-12 DIAGNOSIS — M4800 Spinal stenosis, site unspecified: Secondary | ICD-10-CM | POA: Diagnosis not present

## 2020-05-12 DIAGNOSIS — Z9849 Cataract extraction status, unspecified eye: Secondary | ICD-10-CM | POA: Diagnosis not present

## 2020-05-12 DIAGNOSIS — M1990 Unspecified osteoarthritis, unspecified site: Secondary | ICD-10-CM | POA: Diagnosis not present

## 2020-05-12 DIAGNOSIS — Z87891 Personal history of nicotine dependence: Secondary | ICD-10-CM | POA: Insufficient documentation

## 2020-05-12 DIAGNOSIS — H25812 Combined forms of age-related cataract, left eye: Secondary | ICD-10-CM | POA: Diagnosis not present

## 2020-05-12 HISTORY — DX: Presence of dental prosthetic device (complete) (partial): Z97.2

## 2020-05-12 HISTORY — PX: CATARACT EXTRACTION W/PHACO: SHX586

## 2020-05-12 SURGERY — PHACOEMULSIFICATION, CATARACT, WITH IOL INSERTION
Anesthesia: Monitor Anesthesia Care | Site: Eye | Laterality: Left

## 2020-05-12 MED ORDER — MOXIFLOXACIN HCL 0.5 % OP SOLN
OPHTHALMIC | Status: DC | PRN
  Administered 2020-05-12: 0.2 mL via OPHTHALMIC

## 2020-05-12 MED ORDER — TETRACAINE HCL 0.5 % OP SOLN
1.0000 [drp] | OPHTHALMIC | Status: DC | PRN
  Administered 2020-05-12 (×3): 1 [drp] via OPHTHALMIC

## 2020-05-12 MED ORDER — EPINEPHRINE PF 1 MG/ML IJ SOLN
INTRAOCULAR | Status: DC | PRN
  Administered 2020-05-12: 70 mL via OPHTHALMIC

## 2020-05-12 MED ORDER — NA CHONDROIT SULF-NA HYALURON 40-17 MG/ML IO SOLN
INTRAOCULAR | Status: DC | PRN
  Administered 2020-05-12: 1 mL via INTRAOCULAR

## 2020-05-12 MED ORDER — ARMC OPHTHALMIC DILATING DROPS
1.0000 "application " | OPHTHALMIC | Status: DC | PRN
  Administered 2020-05-12 (×3): 1 via OPHTHALMIC

## 2020-05-12 MED ORDER — LIDOCAINE HCL (PF) 2 % IJ SOLN
INTRAOCULAR | Status: DC | PRN
  Administered 2020-05-12: 2 mL

## 2020-05-12 MED ORDER — FENTANYL CITRATE (PF) 100 MCG/2ML IJ SOLN
INTRAMUSCULAR | Status: DC | PRN
Start: 2020-05-12 — End: 2020-05-12
  Administered 2020-05-12: 50 ug via INTRAVENOUS

## 2020-05-12 MED ORDER — MIDAZOLAM HCL 2 MG/2ML IJ SOLN
INTRAMUSCULAR | Status: DC | PRN
  Administered 2020-05-12: 1 mg via INTRAVENOUS

## 2020-05-12 MED ORDER — BRIMONIDINE TARTRATE-TIMOLOL 0.2-0.5 % OP SOLN
OPHTHALMIC | Status: DC | PRN
  Administered 2020-05-12: 1 [drp] via OPHTHALMIC

## 2020-05-12 SURGICAL SUPPLY — 20 items
CANNULA ANT/CHMB 27G (MISCELLANEOUS) ×2 IMPLANT
CANNULA ANT/CHMB 27GA (MISCELLANEOUS) ×6 IMPLANT
GLOVE SURG LX 8.0 MICRO (GLOVE) ×2
GLOVE SURG LX STRL 8.0 MICRO (GLOVE) ×1 IMPLANT
GLOVE SURG TRIUMPH 8.0 PF LTX (GLOVE) ×3 IMPLANT
GOWN STRL REUS W/ TWL LRG LVL3 (GOWN DISPOSABLE) ×2 IMPLANT
GOWN STRL REUS W/TWL LRG LVL3 (GOWN DISPOSABLE) ×6
LENS IOL ACRSF IQ ULTRA 23.5 (Intraocular Lens) IMPLANT
LENS IOL ACRYSOF IQ 23.5 (Intraocular Lens) ×3 IMPLANT
MARKER SKIN DUAL TIP RULER LAB (MISCELLANEOUS) ×3 IMPLANT
NDL FILTER BLUNT 18X1 1/2 (NEEDLE) ×1 IMPLANT
NEEDLE FILTER BLUNT 18X 1/2SAF (NEEDLE) ×2
NEEDLE FILTER BLUNT 18X1 1/2 (NEEDLE) ×1 IMPLANT
PACK EYE AFTER SURG (MISCELLANEOUS) ×3 IMPLANT
PACK OPTHALMIC (MISCELLANEOUS) ×3 IMPLANT
PACK PORFILIO (MISCELLANEOUS) ×3 IMPLANT
SYR 3ML LL SCALE MARK (SYRINGE) ×3 IMPLANT
SYR TB 1ML LUER SLIP (SYRINGE) ×3 IMPLANT
WATER STERILE IRR 250ML POUR (IV SOLUTION) ×3 IMPLANT
WIPE NON LINTING 3.25X3.25 (MISCELLANEOUS) ×3 IMPLANT

## 2020-05-12 NOTE — Anesthesia Postprocedure Evaluation (Signed)
Anesthesia Post Note  Patient: Cynthia Vaughan  Procedure(s) Performed: CATARACT EXTRACTION PHACO AND INTRAOCULAR LENS PLACEMENT (IOC) LEFT 15.24 01:28.8 (Left Eye)     Patient location during evaluation: PACU Anesthesia Type: MAC Level of consciousness: awake and alert Pain management: pain level controlled Vital Signs Assessment: post-procedure vital signs reviewed and stable Respiratory status: spontaneous breathing Cardiovascular status: blood pressure returned to baseline Postop Assessment: no apparent nausea or vomiting, adequate PO intake and no headache Anesthetic complications: no   No complications documented.  Adele Barthel Gerldine Suleiman

## 2020-05-12 NOTE — Anesthesia Procedure Notes (Signed)
Procedure Name: MAC Date/Time: 05/12/2020 11:16 AM Performed by: Cameron Ali, CRNA Pre-anesthesia Checklist: Patient identified, Emergency Drugs available, Suction available, Timeout performed and Patient being monitored Patient Re-evaluated:Patient Re-evaluated prior to induction Oxygen Delivery Method: Nasal cannula Placement Confirmation: positive ETCO2

## 2020-05-12 NOTE — H&P (Signed)
All labs reviewed. Abnormal studies sent to patients PCP when indicated.  Previous H&P reviewed, patient examined, there are NO CHANGES.  Cynthia Cipriani Porfilio9/21/202111:10 AM

## 2020-05-12 NOTE — Op Note (Signed)
PREOPERATIVE DIAGNOSIS:  Nuclear sclerotic cataract of the left eye.   POSTOPERATIVE DIAGNOSIS:  Nuclear sclerotic cataract of the left eye.   OPERATIVE PROCEDURE:@   SURGEON:  Birder Robson, MD.   ANESTHESIA:  Anesthesiologist: Page, Adele Barthel, MD CRNA: Mayme Genta, CRNA; Cameron Ali, CRNA  1.      Managed anesthesia care. 2.     0.27ml of Shugarcaine was instilled following the paracentesis   COMPLICATIONS:  None.   TECHNIQUE:   Stop and chop   DESCRIPTION OF PROCEDURE:  The patient was examined and consented in the preoperative holding area where the aforementioned topical anesthesia was applied to the left eye and then brought back to the Operating Room where the left eye was prepped and draped in the usual sterile ophthalmic fashion and a lid speculum was placed. A paracentesis was created with the side port blade and the anterior chamber was filled with viscoelastic. A near clear corneal incision was performed with the steel keratome. A continuous curvilinear capsulorrhexis was performed with a cystotome followed by the capsulorrhexis forceps. Hydrodissection and hydrodelineation were carried out with BSS on a blunt cannula. The lens was removed in a stop and chop  technique and the remaining cortical material was removed with the irrigation-aspiration handpiece. The capsular bag was inflated with viscoelastic and the Technis [id number] lens was placed in the capsular bag without complication. The remaining viscoelastic was removed from the eye with the irrigation-aspiration handpiece. The wounds were hydrated. The anterior chamber was flushed with BSS and the eye was inflated to physiologic pressure. 0.67ml Vigamox was placed in the anterior chamber. The wounds were found to be water tight. The eye was dressed with Combigan. The patient was given protective glasses to wear throughout the day and a shield with which to sleep tonight. The patient was also given drops with which to begin a  drop regimen today and will follow-up with me in one day. Implant Name Type Inv. Item Serial No. Manufacturer Lot No. LRB No. Used Action  LENS IOL ACRYSOF IQ 23.5 - [id number] Intraocular Lens LENS IOL ACRYSOF IQ 23.5 [id number] ALCON  Left 1 Implanted    Procedure(s): CATARACT EXTRACTION PHACO AND INTRAOCULAR LENS PLACEMENT (IOC) LEFT 15.24 01:28.8 (Left)  Electronically signed: Birder Robson 05/12/2020 11:35 AM

## 2020-05-12 NOTE — Anesthesia Preprocedure Evaluation (Signed)
Anesthesia Evaluation  Patient identified by MRN, date of birth, ID band Patient awake    History of Anesthesia Complications Negative for: history of anesthetic complications  Airway Mallampati: II  TM Distance: >3 FB Neck ROM: Full    Dental no notable dental hx.    Pulmonary former smoker,    Pulmonary exam normal        Cardiovascular Exercise Tolerance: Good hypertension, Pt. on medications Normal cardiovascular exam     Neuro/Psych PSYCHIATRIC DISORDERS (major depressive disorder) Depression On Sinemet, patient denies Parkinson's. States this is for her memory.    GI/Hepatic GERD  Medicated,  Endo/Other    Renal/GU CRFRenal disease     Musculoskeletal   Abdominal   Peds  Hematology   Anesthesia Other Findings   Reproductive/Obstetrics                            Anesthesia Physical Anesthesia Plan  ASA: III  Anesthesia Plan: MAC   Post-op Pain Management:    Induction: Intravenous  PONV Risk Score and Plan: 2 and TIVA, Midazolam and Treatment may vary due to age or medical condition  Airway Management Planned: Nasal Cannula and Natural Airway  Additional Equipment: None  Intra-op Plan:   Post-operative Plan:   Informed Consent: I have reviewed the patients History and Physical, chart, labs and discussed the procedure including the risks, benefits and alternatives for the proposed anesthesia with the patient or authorized representative who has indicated his/her understanding and acceptance.       Plan Discussed with: CRNA  Anesthesia Plan Comments:         Anesthesia Quick Evaluation

## 2020-05-12 NOTE — Transfer of Care (Signed)
Immediate Anesthesia Transfer of Care Note  Patient: Cynthia Vaughan  Procedure(s) Performed: CATARACT EXTRACTION PHACO AND INTRAOCULAR LENS PLACEMENT (IOC) LEFT 15.24 01:28.8 (Left Eye)  Patient Location: PACU  Anesthesia Type: MAC  Level of Consciousness: awake, alert  and patient cooperative  Airway and Oxygen Therapy: Patient Spontanous Breathing and Patient connected to supplemental oxygen  Post-op Assessment: Post-op Vital signs reviewed, Patient's Cardiovascular Status Stable, Respiratory Function Stable, Patent Airway and No signs of Nausea or vomiting  Post-op Vital Signs: Reviewed and stable  Complications: No complications documented.

## 2020-05-13 ENCOUNTER — Encounter: Payer: Self-pay | Admitting: Ophthalmology

## 2020-05-14 ENCOUNTER — Other Ambulatory Visit: Payer: Self-pay

## 2020-05-14 ENCOUNTER — Encounter: Payer: Self-pay | Admitting: Dermatology

## 2020-05-14 ENCOUNTER — Ambulatory Visit (INDEPENDENT_AMBULATORY_CARE_PROVIDER_SITE_OTHER): Payer: Medicare Other | Admitting: Dermatology

## 2020-05-14 DIAGNOSIS — L578 Other skin changes due to chronic exposure to nonionizing radiation: Secondary | ICD-10-CM | POA: Diagnosis not present

## 2020-05-14 DIAGNOSIS — L820 Inflamed seborrheic keratosis: Secondary | ICD-10-CM

## 2020-05-14 DIAGNOSIS — L821 Other seborrheic keratosis: Secondary | ICD-10-CM | POA: Diagnosis not present

## 2020-05-14 NOTE — Progress Notes (Signed)
   Follow-Up Visit   Subjective  Cynthia Vaughan is a 79 y.o. female who presents for the following: Other (Scaly spots of right cheek that are irritating.). Also other spots of the face to be checked.  The following portions of the chart were reviewed this encounter and updated as appropriate:  Tobacco  Allergies  Meds  Problems  Med Hx  Surg Hx  Fam Hx     Review of Systems:  No other skin or systemic complaints except as noted in HPI or Assessment and Plan.  Objective  Well appearing patient in no apparent distress; mood and affect are within normal limits.  A focused examination was performed including face. Relevant physical exam findings are noted in the Assessment and Plan.  Objective  Right Malar Cheek (5): Erythematous keratotic or waxy stuck-on papule or plaque.    Assessment & Plan    Actinic Damage - diffuse scaly erythematous macules with underlying dyspigmentation - Recommend daily broad spectrum sunscreen SPF 30+ to sun-exposed areas, reapply every 2 hours as needed.  - Call for new or changing lesions.  Seborrheic Keratoses - Stuck-on, waxy, tan-brown papules and plaques  - Discussed benign etiology and prognosis. - Observe - Call for any changes   Inflamed seborrheic keratosis (5) Right Malar Cheek  Destruction of lesion - Right Malar Cheek Complexity: simple   Destruction method: cryotherapy   Informed consent: discussed and consent obtained   Timeout:  patient name, date of birth, surgical site, and procedure verified Lesion destroyed using liquid nitrogen: Yes   Region frozen until ice ball extended beyond lesion: Yes   Outcome: patient tolerated procedure well with no complications   Post-procedure details: wound care instructions given    Return if symptoms worsen or fail to improve.  I, Ashok Cordia, CMA, am acting as scribe for Sarina Ser, MD .  Documentation: I have reviewed the above documentation for accuracy and completeness,  and I agree with the above.  Sarina Ser, MD

## 2020-05-14 NOTE — Patient Instructions (Signed)

## 2020-05-27 ENCOUNTER — Other Ambulatory Visit: Payer: Self-pay

## 2020-05-27 MED ORDER — GABAPENTIN 100 MG PO CAPS: 100.0000 mg | ORAL_CAPSULE | Freq: Three times a day (TID) | ORAL | 2 refills | Status: DC

## 2020-06-15 ENCOUNTER — Encounter: Payer: Self-pay | Admitting: Family Medicine

## 2020-06-15 ENCOUNTER — Ambulatory Visit (INDEPENDENT_AMBULATORY_CARE_PROVIDER_SITE_OTHER): Payer: Medicare Other

## 2020-06-15 ENCOUNTER — Other Ambulatory Visit: Payer: Self-pay

## 2020-06-15 ENCOUNTER — Ambulatory Visit (INDEPENDENT_AMBULATORY_CARE_PROVIDER_SITE_OTHER): Payer: Medicare Other | Admitting: Family Medicine

## 2020-06-15 VITALS — BP 111/69 | HR 72 | Temp 98.4°F | Ht 62.0 in | Wt 133.0 lb

## 2020-06-15 VITALS — Ht 62.0 in | Wt 130.0 lb

## 2020-06-15 DIAGNOSIS — Z1159 Encounter for screening for other viral diseases: Secondary | ICD-10-CM | POA: Diagnosis not present

## 2020-06-15 DIAGNOSIS — N182 Chronic kidney disease, stage 2 (mild): Secondary | ICD-10-CM

## 2020-06-15 DIAGNOSIS — R413 Other amnesia: Secondary | ICD-10-CM

## 2020-06-15 DIAGNOSIS — I129 Hypertensive chronic kidney disease with stage 1 through stage 4 chronic kidney disease, or unspecified chronic kidney disease: Secondary | ICD-10-CM | POA: Diagnosis not present

## 2020-06-15 DIAGNOSIS — Z23 Encounter for immunization: Secondary | ICD-10-CM | POA: Diagnosis not present

## 2020-06-15 DIAGNOSIS — E782 Mixed hyperlipidemia: Secondary | ICD-10-CM

## 2020-06-15 DIAGNOSIS — F419 Anxiety disorder, unspecified: Secondary | ICD-10-CM | POA: Diagnosis not present

## 2020-06-15 DIAGNOSIS — E559 Vitamin D deficiency, unspecified: Secondary | ICD-10-CM

## 2020-06-15 DIAGNOSIS — K219 Gastro-esophageal reflux disease without esophagitis: Secondary | ICD-10-CM | POA: Diagnosis not present

## 2020-06-15 DIAGNOSIS — Z0000 Encounter for general adult medical examination without abnormal findings: Secondary | ICD-10-CM

## 2020-06-15 DIAGNOSIS — F331 Major depressive disorder, recurrent, moderate: Secondary | ICD-10-CM | POA: Diagnosis not present

## 2020-06-15 LAB — MICROALBUMIN, URINE WAIVED
Creatinine, Urine Waived: 100 mg/dL (ref 10–300)
Microalb, Ur Waived: 150 mg/L — ABNORMAL HIGH (ref 0–19)
Microalb/Creat Ratio: >300 mg/g — ABNORMAL HIGH (ref <30)

## 2020-06-15 MED ORDER — DULOXETINE HCL 30 MG PO CPEP: 30.0000 mg | ORAL_CAPSULE | Freq: Every day | ORAL | 1 refills | Status: DC

## 2020-06-15 MED ORDER — ATORVASTATIN CALCIUM 40 MG PO TABS
40.0000 mg | ORAL_TABLET | Freq: Every day | ORAL | 1 refills | Status: DC
Start: 2020-06-15 — End: 2020-12-24

## 2020-06-15 MED ORDER — DONEPEZIL HCL 10 MG PO TABS: 10.0000 mg | ORAL_TABLET | Freq: Every day | ORAL | 1 refills | Status: DC

## 2020-06-15 MED ORDER — LORATADINE 10 MG PO TABS
10.0000 mg | ORAL_TABLET | Freq: Every day | ORAL | 3 refills | Status: DC
Start: 2020-06-15 — End: 2021-05-27

## 2020-06-15 MED ORDER — AMLODIPINE BESYLATE 2.5 MG PO TABS
2.5000 mg | ORAL_TABLET | Freq: Every day | ORAL | 1 refills | Status: DC
Start: 2020-06-15 — End: 2020-11-27

## 2020-06-15 MED ORDER — BUPROPION HCL 75 MG PO TABS: 75.0000 mg | ORAL_TABLET | Freq: Every day | ORAL | 1 refills | Status: DC

## 2020-06-15 MED ORDER — OMEPRAZOLE 20 MG PO CPDR
20.0000 mg | DELAYED_RELEASE_CAPSULE | Freq: Every day | ORAL | 1 refills | Status: DC
Start: 2020-06-15 — End: 2020-12-24

## 2020-06-15 MED ORDER — GABAPENTIN 100 MG PO CAPS
100.0000 mg | ORAL_CAPSULE | Freq: Three times a day (TID) | ORAL | 1 refills | Status: DC
Start: 2020-06-15 — End: 2020-12-24

## 2020-06-15 MED ORDER — LOSARTAN POTASSIUM 50 MG PO TABS
50.0000 mg | ORAL_TABLET | Freq: Every day | ORAL | 1 refills | Status: DC
Start: 2020-06-15 — End: 2020-12-24

## 2020-06-15 NOTE — Assessment & Plan Note (Signed)
Rechecking labs today. Await results. Treat as needed.  °

## 2020-06-15 NOTE — Assessment & Plan Note (Signed)
Over treated. Will cut her amlodipine to 2.5mg  and recheck 3 months. Continue to monitor.

## 2020-06-15 NOTE — Progress Notes (Signed)
BP 111/69 (BP Location: Left Arm, Patient Position: Sitting)   Pulse 72   Temp 98.4 F (36.9 C) (Oral)   Ht 5\' 2"  (1.575 m)   Wt 133 lb (60.3 kg)   LMP  (LMP Unknown)   SpO2 95%   BMI 24.33 kg/m    Subjective:    Patient ID: Cynthia Vaughan, female    DOB: 1941-03-31, 79 y.o.   MRN: [id number]  HPI: Cynthia Vaughan is a 79 y.o. female  Chief Complaint  Patient presents with  . Gastroesophageal Reflux    follow up   . Depression  . Hyperlipidemia  . Chronic Kidney Disease   HYPERTENSION / [place] Satisfied with current treatment? yes Duration of hypertension: chronic BP monitoring frequency: not checking BP medication side effects: no Past BP meds: losartan, amlodipine Duration of hyperlipidemia: chronic Cholesterol medication side effects: no Cholesterol supplements: none Past cholesterol medications: crestor Medication compliance: excellent compliance Aspirin: no Recent stressors: no Recurrent headaches: no Visual changes: no Palpitations: no Dyspnea: no Chest pain: no Lower extremity edema: no Dizzy/lightheaded: no  DEPRESSION Mood status: controlled Satisfied with current treatment?: yes Symptom severity: mild  Duration of current treatment : years Side effects: no Medication compliance: excellent compliance Psychotherapy/counseling: no  Previous psychiatric medications: welbutrin, cymbalta Depressed mood: no Anxious mood: no Anhedonia: no Significant weight loss or gain: no Insomnia: no  Fatigue: no Feelings of worthlessness or guilt: no Impaired concentration/indecisiveness: no Suicidal ideations: no Hopelessness: no Crying spells: no Depression screen [facility] 2/9 06/15/2020 06/15/2020 12/30/2019 06/13/2019 06/13/2019  Decreased Interest 0 0 0 0 0  Down, Depressed, Hopeless 0 0 0 0 0  PHQ - 2 Score 0 0 0 0 0  Altered sleeping 0 - 0 3 -  Tired, decreased energy 0 - 0 3 -  Change in appetite 0 - 0 0 -  Feeling bad or failure about  yourself  0 - 0 0 -  Trouble concentrating 0 - 0 1 -  Moving slowly or fidgety/restless 0 - 0 0 -  Suicidal thoughts 0 - 0 0 -  PHQ-9 Score 0 - 0 7 -  Difficult doing work/chores Not difficult at all - Not difficult at all Somewhat difficult -  Some recent data might be hidden   GERD GERD control status: controlled  Satisfied with current treatment? yes Heartburn frequency: rarely Medication side effects: no  Medication compliance: good Dysphagia: no Odynophagia:  no Hematemesis: no Blood in stool: no EGD: no  Relevant past medical, surgical, family and social history reviewed and updated as indicated. Interim medical history since our last visit reviewed. Allergies and medications reviewed and updated.  Review of Systems  Constitutional: Negative.   Respiratory: Negative.   Cardiovascular: Negative.   Gastrointestinal: Negative.   Musculoskeletal: Negative.   Skin: Negative.   Psychiatric/Behavioral: Positive for confusion. Negative for agitation, behavioral problems, decreased concentration, dysphoric mood, hallucinations, self-injury, sleep disturbance and suicidal ideas. The patient is not nervous/anxious and is not hyperactive.     Per HPI unless specifically indicated above     Objective:    BP 111/69 (BP Location: Left Arm, Patient Position: Sitting)   Pulse 72   Temp 98.4 F (36.9 C) (Oral)   Ht 5\' 2"  (1.575 m)   Wt 133 lb (60.3 kg)   LMP  (LMP Unknown)   SpO2 95%   BMI 24.33 kg/m   Wt Readings from Last 3 Encounters:  06/15/20 133 lb (60.3 kg)  06/15/20 130 lb (59  kg)  05/12/20 132 lb (59.9 kg)    Physical Exam Vitals and nursing note reviewed.  Constitutional:      General: She is not in acute distress.    Appearance: Normal appearance. She is not ill-appearing, toxic-appearing or diaphoretic.  HENT:     Head: Normocephalic and atraumatic.     Right Ear: External ear normal.     Left Ear: External ear normal.     Nose: Nose normal.      Mouth/Throat:     Mouth: Mucous membranes are moist.     Pharynx: Oropharynx is clear.  Eyes:     General: No scleral icterus.       Right eye: No discharge.        Left eye: No discharge.     Extraocular Movements: Extraocular movements intact.     Conjunctiva/sclera: Conjunctivae normal.     Pupils: Pupils are equal, round, and reactive to light.  Cardiovascular:     Rate and Rhythm: Normal rate and regular rhythm.     Pulses: Normal pulses.     Heart sounds: Normal heart sounds. No murmur heard.  No friction rub. No gallop.   Pulmonary:     Effort: Pulmonary effort is normal. No respiratory distress.     Breath sounds: Normal breath sounds. No stridor. No wheezing, rhonchi or rales.  Chest:     Chest wall: No tenderness.  Musculoskeletal:        General: Normal range of motion.     Cervical back: Normal range of motion and neck supple.  Skin:    General: Skin is warm and dry.     Capillary Refill: Capillary refill takes less than 2 seconds.     Coloration: Skin is not jaundiced or pale.     Findings: No bruising, erythema, lesion or rash.  Neurological:     General: No focal deficit present.     Mental Status: She is alert and oriented to person, place, and time. Mental status is at baseline.  Psychiatric:        Mood and Affect: Mood normal.        Behavior: Behavior normal.        Thought Content: Thought content normal.        Judgment: Judgment normal.     Results for orders placed or performed during the hospital encounter of 05/08/20  SARS CORONAVIRUS 2 (TAT 6-24 HRS) Nasopharyngeal Nasopharyngeal Swab   Specimen: Nasopharyngeal Swab  Result Value Ref Range   SARS Coronavirus 2 NEGATIVE NEGATIVE      Assessment & Plan:   Problem List Items Addressed This Visit      Digestive   GERD (gastroesophageal reflux disease)    Under good control on current regimen. Continue current regimen. Continue to monitor. Call with any concerns. Refills given. Labs drawn  today.       Relevant Orders   CBC with Differential/Platelet   Comprehensive metabolic panel     Genitourinary   Benign hypertensive renal disease - Primary    Over treated. Will cut her amlodipine to 2.5mg  and recheck 3 months. Continue to monitor.       Relevant Orders   CBC with Differential/Platelet   Comprehensive metabolic panel   Microalbumin, Urine Waived   CKD (chronic kidney disease) stage 2, GFR 60-89 ml/min    Rechecking labs today. Await results. Treat as needed.       Relevant Orders   CBC with Differential/Platelet   Comprehensive metabolic  panel   Microalbumin, Urine Waived   Urinalysis, Routine w reflex microscopic     Other   Anxiety    Under good control on current regimen. Continue current regimen. Continue to monitor. Call with any concerns. Refills given. Labs drawn today.       Relevant Orders   CBC with Differential/Platelet   Comprehensive metabolic panel   Hyperlipidemia    Under good control on current regimen. Continue current regimen. Continue to monitor. Call with any concerns. Refills given. Labs drawn today.       Relevant Orders   CBC with Differential/Platelet   Comprehensive metabolic panel   Lipid Panel w/o Chol/HDL Ratio   Vitamin D deficiency disease    Rechecking labs today. Await results. Treat as needed.       Relevant Orders   CBC with Differential/Platelet   Comprehensive metabolic panel   VITAMIN D 25 Hydroxy (Vit-D Deficiency, Fractures)   Memory impairment    Under good control on current regimen. Continue current regimen. Continue to monitor. Call with any concerns. Refills given. Labs drawn today.       Relevant Orders   CBC with Differential/Platelet   Comprehensive metabolic panel   MDD (major depressive disorder), recurrent episode, moderate (HCC)    Under good control on current regimen. Continue current regimen. Continue to monitor. Call with any concerns. Refills given. Labs drawn today.        Relevant Orders   CBC with Differential/Platelet   Comprehensive metabolic panel   TSH    Other Visit Diagnoses    Need for hepatitis C screening test       Labs drawn today.   Relevant Orders   Hepatitis C Antibody   Needs flu shot       Flu shot given today.   Relevant Orders   Flu Vaccine QUAD High Dose(Fluad) (Completed)       Follow up plan: Return in about 3 months (around 09/15/2020) for bp follow up.

## 2020-06-15 NOTE — Addendum Note (Signed)
Addended by: [name] on: 06/15/2020 04:43 PM   Modules accepted: Orders

## 2020-06-15 NOTE — Assessment & Plan Note (Signed)
Under good control on current regimen. Continue current regimen. Continue to monitor. Call with any concerns. Refills given. Labs drawn today.   

## 2020-06-15 NOTE — Patient Instructions (Signed)
Cynthia Vaughan , Thank you for taking time to come for your Medicare Wellness Visit. I appreciate your ongoing commitment to your health goals. Please review the following plan we discussed and let me know if I can assist you in the future.   Screening recommendations/referrals: Colonoscopy: not required Mammogram: not required Bone Density: completed 04/25/2014 Recommended yearly ophthalmology/optometry visit for glaucoma screening and checkup Recommended yearly dental visit for hygiene and checkup  Vaccinations: Influenza vaccine: due Pneumococcal vaccine: completed 07/07/2015 Tdap vaccine: completed 06/01/2018 Shingles vaccine: discussed   Covid-19: 10/14/2019, 11/04/2019  Advanced directives: Advance directive discussed with you today. .   Conditions/risks identified: none  Next appointment: Follow up in one year for your annual wellness visit    Preventive Care 65 Years and Older, Female Preventive care refers to lifestyle choices and visits with your health care provider that can promote health and wellness. What does preventive care include?  A yearly physical exam. This is also called an annual well check.  Dental exams once or twice a year.  Routine eye exams. Ask your health care provider how often you should have your eyes checked.  Personal lifestyle choices, including:  Daily care of your teeth and gums.  Regular physical activity.  Eating a healthy diet.  Avoiding tobacco and drug use.  Limiting alcohol use.  Practicing safe sex.  Taking low-dose aspirin every day.  Taking vitamin and mineral supplements as recommended by your health care provider. What happens during an annual well check? The services and screenings done by your health care provider during your annual well check will depend on your age, overall health, lifestyle risk factors, and family history of disease. Counseling  Your health care provider may ask you questions about  your:  Alcohol use.  Tobacco use.  Drug use.  Emotional well-being.  Home and relationship well-being.  Sexual activity.  Eating habits.  History of falls.  Memory and ability to understand (cognition).  Work and work Statistician.  Reproductive health. Screening  You may have the following tests or measurements:  Height, weight, and BMI.  Blood pressure.  Lipid and cholesterol levels. These may be checked every 5 years, or more frequently if you are over 62 years old.  Skin check.  Lung cancer screening. You may have this screening every year starting at age 78 if you have a 30-pack-year history of smoking and currently smoke or have quit within the past 15 years.  Fecal occult blood test (FOBT) of the stool. You may have this test every year starting at age 78.  Flexible sigmoidoscopy or colonoscopy. You may have a sigmoidoscopy every 5 years or a colonoscopy every 10 years starting at age 78.  Hepatitis C blood test.  Hepatitis B blood test.  Sexually transmitted disease (STD) testing.  Diabetes screening. This is done by checking your blood sugar (glucose) after you have not eaten for a while (fasting). You may have this done every 1-3 years.  Bone density scan. This is done to screen for osteoporosis. You may have this done starting at age 78.  Mammogram. This may be done every 1-2 years. Talk to your health care provider about how often you should have regular mammograms. Talk with your health care provider about your test results, treatment options, and if necessary, the need for more tests. Vaccines  Your health care provider may recommend certain vaccines, such as:  Influenza vaccine. This is recommended every year.  Tetanus, diphtheria, and acellular pertussis (Tdap, Td) vaccine. You  may need a Td booster every 10 years.  Zoster vaccine. You may need this after age 78.  Pneumococcal 13-valent conjugate (PCV13) vaccine. One dose is recommended  after age 78.  Pneumococcal polysaccharide (PPSV23) vaccine. One dose is recommended after age 78. Talk to your health care provider about which screenings and vaccines you need and how often you need them. This information is not intended to replace advice given to you by your health care provider. Make sure you discuss any questions you have with your health care provider. Document Released: 09/04/2015 Document Revised: 04/27/2016 Document Reviewed: 06/09/2015 Elsevier Interactive Patient Education  2017 [place] Prevention in the Home Falls can cause injuries. They can happen to people of all ages. There are many things you can do to make your home safe and to help prevent falls. What can I do on the outside of my home?  Regularly fix the edges of walkways and driveways and fix any cracks.  Remove anything that might make you trip as you walk through a door, such as a raised step or threshold.  Trim any bushes or trees on the path to your home.  Use bright outdoor lighting.  Clear any walking paths of anything that might make someone trip, such as rocks or tools.  Regularly check to see if handrails are loose or broken. Make sure that both sides of any steps have handrails.  Any raised decks and porches should have guardrails on the edges.  Have any leaves, snow, or ice cleared regularly.  Use sand or salt on walking paths during winter.  Clean up any spills in your garage right away. This includes oil or grease spills. What can I do in the bathroom?  Use night lights.  Install grab bars by the toilet and in the tub and shower. Do not use towel bars as grab bars.  Use non-skid mats or decals in the tub or shower.  If you need to sit down in the shower, use a plastic, non-slip stool.  Keep the floor dry. Clean up any water that spills on the floor as soon as it happens.  Remove soap buildup in the tub or shower regularly.  Attach bath mats securely with  double-sided non-slip rug tape.  Do not have throw rugs and other things on the floor that can make you trip. What can I do in the bedroom?  Use night lights.  Make sure that you have a light by your bed that is easy to reach.  Do not use any sheets or blankets that are too big for your bed. They should not hang down onto the floor.  Have a firm chair that has side arms. You can use this for support while you get dressed.  Do not have throw rugs and other things on the floor that can make you trip. What can I do in the kitchen?  Clean up any spills right away.  Avoid walking on wet floors.  Keep items that you use a lot in easy-to-reach places.  If you need to reach something above you, use a strong step stool that has a grab bar.  Keep electrical cords out of the way.  Do not use floor polish or wax that makes floors slippery. If you must use wax, use non-skid floor wax.  Do not have throw rugs and other things on the floor that can make you trip. What can I do with my stairs?  Do not leave any items  on the stairs.  Make sure that there are handrails on both sides of the stairs and use them. Fix handrails that are broken or loose. Make sure that handrails are as long as the stairways.  Check any carpeting to make sure that it is firmly attached to the stairs. Fix any carpet that is loose or worn.  Avoid having throw rugs at the top or bottom of the stairs. If you do have throw rugs, attach them to the floor with carpet tape.  Make sure that you have a light switch at the top of the stairs and the bottom of the stairs. If you do not have them, ask someone to add them for you. What else can I do to help prevent falls?  Wear shoes that:  Do not have high heels.  Have rubber bottoms.  Are comfortable and fit you well.  Are closed at the toe. Do not wear sandals.  If you use a stepladder:  Make sure that it is fully opened. Do not climb a closed stepladder.  Make  sure that both sides of the stepladder are locked into place.  Ask someone to hold it for you, if possible.  Clearly mark and make sure that you can see:  Any grab bars or handrails.  First and last steps.  Where the edge of each step is.  Use tools that help you move around (mobility aids) if they are needed. These include:  Canes.  Walkers.  Scooters.  Crutches.  Turn on the lights when you go into a dark area. Replace any light bulbs as soon as they burn out.  Set up your furniture so you have a clear path. Avoid moving your furniture around.  If any of your floors are uneven, fix them.  If there are any pets around you, be aware of where they are.  Review your medicines with your doctor. Some medicines can make you feel dizzy. This can increase your chance of falling. Ask your doctor what other things that you can do to help prevent falls. This information is not intended to replace advice given to you by your health care provider. Make sure you discuss any questions you have with your health care provider. Document Released: 06/04/2009 Document Revised: 01/14/2016 Document Reviewed: 09/12/2014 Elsevier Interactive Patient Education  2017 [employer].

## 2020-06-15 NOTE — Progress Notes (Addendum)
I connected with Barnie Mort today by telephone and verified that I am speaking with the correct person using two identifiers. Location patient: home Location provider: work Persons participating in the virtual visit: Berdine Rasmusson, spouse, [name] Lu, [place] LPN.   I discussed the limitations, risks, security and privacy concerns of performing an evaluation and management service by telephone and the availability of in person appointments. I also discussed with the patient that there may be a patient responsible charge related to this service. The patient expressed understanding and verbally consented to this telephonic visit.    Interactive audio and video telecommunications were attempted between this provider and patient, however failed, due to patient having technical difficulties OR patient did not have access to video capability.  We continued and completed visit with audio only.     Vital signs may be patient reported or missing.  Subjective:   Cynthia Vaughan is a 79 y.o. female who presents for Medicare Annual (Subsequent) preventive examination.  Review of Systems     Cardiac Risk Factors include: advanced age (>16mn, >>20women);sedentary lifestyle     Objective:    Today's Vitals   06/15/20 1427  Weight: 130 lb (59 kg)  Height: _0  (1.575 m)   Body mass index is 23.78 kg/m.  Advanced Directives 06/15/2020 05/12/2020 06/13/2019 12/18/2017 04/20/2017 02/18/2016  Does Patient Have a Medical Advance Directive? _1  No  Does patient want to make changes to medical advance directive? - - Yes (MAU/Ambulatory/Procedural Areas - Information given) - - -  Would patient like information on creating a medical advance directive? - No - Patient declined - No - Patient declined No - Patient declined -  Some encounter information is confidential and restricted. Go to Review Flowsheets activity to see all data.    Current Medications (verified) Outpatient  Encounter Medications as of 06/15/2020  Medication Sig  . acetaminophen (TYLENOL) 500 MG tablet Take 500 mg by mouth every 6 (six) hours as needed.  .Marland KitchenamLODipine (NORVASC) 5 MG tablet Take 1 tablet (5 mg total) by mouth daily.  .Marland Kitchenaspirin EC 81 MG tablet Take 81 mg by mouth daily.  .Marland Kitchenatorvastatin (LIPITOR) 40 MG tablet Take 1 tablet (40 mg total) by mouth at bedtime.  .Marland KitchenbuPROPion (WELLBUTRIN) 75 MG tablet Take 1 tablet (75 mg total) by mouth daily with breakfast.  . Calcium Carb-Cholecalciferol (CALCIUM 600 + D PO) Take by mouth daily.  . carbidopa-levodopa (SINEMET IR) 25-100 MG tablet Take 1 tablet by mouth 3 (three) times daily.  . diclofenac sodium (VOLTAREN) 1 % GEL Apply 4 g topically 4 (four) times daily.  . DULoxetine (CYMBALTA) 30 MG capsule Take 1 capsule (30 mg total) by mouth daily.  . fluticasone (FLONASE) 50 MCG/ACT nasal spray Instill 2 sprays in each nostril daily.  .Marland Kitchengabapentin (NEURONTIN) 100 MG capsule Take 1 capsule (100 mg total) by mouth 3 (three) times daily.  .Marland Kitchenloratadine (CLARITIN) 10 MG tablet Take 1 tablet (10 mg total) by mouth daily.  .Marland Kitchenlosartan (COZAAR) 50 MG tablet Take 1 tablet (50 mg total) by mouth daily.  . memantine (NAMENDA) 5 MG tablet Take 5 mg by mouth daily.  .Marland Kitchenomeprazole (PRILOSEC) 20 MG capsule Take 1 capsule (20 mg total) by mouth daily.  .Marland Kitchendonepezil (ARICEPT) 10 MG tablet Take 1 tablet (10 mg total) by mouth at bedtime.   No facility-administered encounter medications on file as of 06/15/2020.    Allergies (verified) Codeine and  Lisinopril   History: Past Medical History:  Diagnosis Date  . Allergy   . Anxiety   . Cancer ([place])    SKIN  . CKD (chronic kidney disease) stage 2, GFR 60-89 ml/min   . Depression   . Edema    LEGS/FEET  . GERD (gastroesophageal reflux disease)   . Heart murmur   . Hx of basal cell carcinoma 10/20/2009   R lateral infraorbital  . Hx of basal cell carcinoma 06/30/2015   R infra orbital scar  .  Hyperlipidemia   . Hypertension   . Hypopotassemia   . Microalbuminuria   . Osteoarthritis    feet, legs  . Osteopenia   . [facility] spotted fever) 05/10/2016  . Urinary incontinence   . Vitamin D deficiency disease   . Wears dentures    partial lower  . Wheezing    Past Surgical History:  Procedure Laterality Date  . ABDOMINAL HYSTERECTOMY  2004   Total (due to bladder surgery)  . BLADDER SURGERY     x 2  . BUNIONECTOMY    . CATARACT EXTRACTION W/PHACO Right 11/23/2015   Procedure: CATARACT EXTRACTION PHACO AND INTRAOCULAR LENS PLACEMENT (IOC);  Surgeon: Estill Cotta, MD;  Location: [facility] ORS;  Service: Ophthalmology;  Laterality: Right;  [place]    1:30.9 AP%  26.9 CDE   42.29 flud casette lot # [id number]  exp05/31/2018  . CATARACT EXTRACTION W/PHACO Left 05/12/2020   Procedure: CATARACT EXTRACTION PHACO AND INTRAOCULAR LENS PLACEMENT (IOC) LEFT 15.24 01:28.8;  Surgeon: Birder Robson, MD;  Location: [place];  Service: Ophthalmology;  Laterality: Left;  . COLONOSCOPY     Family History  Problem Relation Age of Onset  . AAA (abdominal aortic aneurysm) Mother   . Arthritis Brother   . Heart disease Brother   . Stroke Daughter   . Diabetes Son   . Stroke Son   . Seizures Son   . Diabetes Brother   . Heart disease Brother        7 total heart attacks  . Hyperlipidemia Brother   . Hypertension Brother   . Dementia Brother   . AAA (abdominal aortic aneurysm) Brother   . Stroke Son   . Heart disease Brother   . Dementia Sister    Social History   Socioeconomic History  . Marital status: Married    Spouse name: hal  . Number of children: 3  . Years of education: Not on file  . Highest education level: GED or equivalent  Occupational History  . Occupation: retired   Tobacco Use  . Smoking status: Former Smoker    Quit date: 1990    Years since quitting: 31.8  . Smokeless tobacco: Never Used  . Tobacco comment:    Vaping Use  . Vaping  Use: Never used  Substance and Sexual Activity  . Alcohol use: Not Currently    Comment: OCCAS  . Drug use: No  . Sexual activity: Not Currently  Other Topics Concern  . Not on file  Social History Narrative  . Not on file   Social Determinants of Health   Financial Resource Strain: Low Risk   . Difficulty of Paying Living Expenses: Not hard at all  Food Insecurity: No Food Insecurity  . Worried About Charity fundraiser in the Last Year: Never true  . Ran Out of Food in the Last Year: Never true  Transportation Needs: No Transportation Needs  . Lack of Transportation (Medical): No  .  Lack of Transportation (Non-Medical): No  Physical Activity: Inactive  . Days of Exercise per Week: 0 days  . Minutes of Exercise per Session: 0 min  Stress: No Stress Concern Present  . Feeling of Stress : Not at all  Social Connections:   . Frequency of Communication with Friends and Family: Not on file  . Frequency of Social Gatherings with Friends and Family: Not on file  . Attends Religious Services: Not on file  . Active Member of Clubs or Organizations: Not on file  . Attends Archivist Meetings: Not on file  . Marital Status: Not on file    Tobacco Counseling Counseling given: Not Answered Comment:     Clinical Intake:  Pre-visit preparation completed: Yes  Pain : No/denies pain     Nutritional Status: BMI of 19-24  Normal Nutritional Risks: None Diabetes: No  How often do you need to have someone help you when you read instructions, pamphlets, or other written materials from your doctor or pharmacy?: 1 - Never What is the last grade level you completed in school?: GED  Diabetic? no  Interpreter Needed?: No  Information entered by :: NAllen LPN   Activities of Daily Living In your present state of health, do you have any difficulty performing the following activities: 06/15/2020 05/12/2020  Hearing? N N  Vision? N N  Difficulty concentrating or making  decisions? N N  Walking or climbing stairs? N N  Dressing or bathing? N -  Doing errands, shopping? N -  Preparing Food and eating ? N -  Using the Toilet? N -  In the past six months, have you accidently leaked urine? N -  Do you have problems with loss of bowel control? N -  Managing your Medications? N -  Managing your Finances? N -  Housekeeping or managing your Housekeeping? N -  Some recent data might be hidden    Patient Care Team: [name], DO as PCP - General (Family Medicine)  Indicate any recent Medical Services you may have received from other than [facility] providers in the past year (date may be approximate).     Assessment:   This is a routine wellness examination for [place].  Hearing/Vision screen  Hearing Screening   _0  _1  _2  _3  _4  _5  _6  _7  _8   Right ear:           Left ear:           Vision Screening Comments: Regular eye exams, [facility]   Dietary issues and exercise activities discussed: Current Exercise Habits: The patient does not participate in regular exercise at present  Goals    .  "She might need help managing her medications" (pt-stated)      Current Barriers:  . Diminished mood/flat affect, as well as "sleepiness" over the past few months. Additionally, patient struggling with tremor that inhibits some activities (husband notes she cannot hold a cup of coffee) . Established with psychiatry on 02/18/2019 - d/c aripiprazole and reduced duloxetine, started bupropion. At follow up appointment on 04/01/2019, it was noted that patient was improved; reported improvement in energy/motivation, as well as improvement in mood. Denies trouble sleeping. . Today, patient's husband, [name], confirms the above information. Also notes possible improvement in tremor. Confirms neurology follow up on 05/01/2019 with Dr. Melrose Nakayama o Does note that he picked up a prescription for donepezil 10 mg daily from [facility] that was prescribed  by neurology. Somewhat frustrated, as they prefer all medications  come from [place]. This was how patient accidentally took 20 mg donepezil for a while; they filled prescriptions at both [facility] and [facility] (unsure how insurance allowed this)   Pharmacist Clinical Goal(s):  [name] Over the next 90 days, patient will work with primary care provider and CCM team to address needs related to optimized medication management  Interventions: . Husband knows to look out at upcoming [facility] fill and if donepezil is included, to not give an extra donepezil from the [facility] fill. Encouraged him to update the patient's preferred pharmacy at [facility] when they go next month  Patient Self Care Activities:  . Currently UNABLE TO independently manage health  Please see past updates related to this goal by clicking on the "Past Updates" button in the selected goal      .  I am just so sleepy and I don't know why (pt-stated)      Current Barriers:  [name] Knowledge Deficits related to recent symptom of increased letharygy   Nurse Case Manager Clinical Goal(s):  [name] Over the next 90 days, patient will work with [facility] to address needs related to incresed letharygy  Interventions:  . Provided education to patient re: on CCM and what resources CCM is availible to provide    . Met with patient and her spouse in exam room. Nash Dimmer with PCP about patients needs . 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 Provided CCM contact information . Spoke with patient's spouse about how patient was feeling and recent neurology appt.  [name] Spouse states patient continues to extremely lethargic . Spouse states Home Health has started and is helpful . Spouse states Neurology ordered Psych referral but he has not heard from them,  . Left a Vm with Psych referral coordinator Jonelle Sidle  . Psych Referral coordinator called and confirmed she was able to reach spouse and schedule appointment-  July 9 with Dr. Thomasena Edis @ [facility].  [name]   Patient Self Care Activities:  . Currently UNABLE TO independently maintain activities of daily living realted to increased lethargy  Please see past updates related to this goal by clicking on the "Past Updates" button in the selected goal      .  Increase water intake       Recommend drinking at least 3-4 glasses of water a day     .  Patient Stated      06/15/2020, no goals      Depression Screen PHQ 2/9 Scores 06/15/2020 12/30/2019 06/13/2019 06/13/2019 03/08/2019 02/14/2019 12/04/2018  PHQ - 2 Score 0 0 0 0 0 4 1  PHQ- 9 Score - 0 7 - _0 Fall Risk Fall Risk  06/15/2020 06/13/2019 12/04/2018 09/12/2017 05/08/2017  Falls in the past year? 0 0 0 No Yes  Number falls in past yr: - 0 - - 1  Injury with Fall? - 0 - - No  Risk for fall due to : Medication side effect - - - -  Follow up Education provided;Falls evaluation completed;Falls prevention discussed - Falls evaluation completed - -    Any stairs in or around the home? Yes  If so, are there any without handrails? No  Home free of loose throw rugs in walkways, pet beds, electrical cords, etc? Yes  Adequate lighting in your home to reduce risk of falls? Yes   ASSISTIVE DEVICES UTILIZED TO PREVENT FALLS:  Life alert? No  Use of a cane, walker or w/c? No  Grab bars in the bathroom? Yes  Shower chair or bench in shower? Yes  Elevated toilet seat or a handicapped toilet? No   TIMED UP AND GO:  Was the test performed? No . .  Cognitive Function: MMSE - Mini Mental State Exam 12/04/2018 12/18/2017  Orientation to time 3 3  Orientation to Place 5 4  Registration 3 3  Attention/ Calculation 2 5  Recall 0 0  Language- name 2 objects 2 2  Language- repeat 1 1  Language- follow 3 step command 3 3  Language- read & follow direction 1 1  Write a sentence 1 1  Copy design 1 1  Total score 22 24     6CIT Screen 06/15/2020 06/13/2019 04/20/2017  What Year? 0  points 0 points 0 points  What month? 0 points 0 points 0 points  What time? 0 points 0 points 0 points  Count back from 20 0 points 0 points 0 points  Months in reverse 4 points 0 points 0 points  Repeat phrase 6 points 4 points 0 points  Total Score 10 4 0    Immunizations Immunization History  Administered Date(s) Administered  . Fluad Quad(high Dose 65+) 05/16/2019  . Influenza, High Dose Seasonal PF 06/21/2017, 06/01/2018  . Influenza,inj,Quad PF,6+ Mos 05/21/2015  . Influenza,inj,quad, With Preservative 06/22/2016  . Influenza-Unspecified 06/11/2014, 05/21/2015, 07/04/2016  . PFIZER SARS-COV-2 Vaccination 10/14/2019, 11/04/2019  . Pneumococcal Conjugate-13 07/07/2015  . Pneumococcal Polysaccharide-23 02/15/2006, 11/04/2010  . Td 02/15/2006, 06/01/2018  . Zoster 02/05/2008    TDAP status: Up to date Flu Vaccine status: Declined, Education has been provided regarding the importance of this vaccine but patient still declined. Advised may receive this vaccine at local pharmacy or Health Dept. Aware to provide a copy of the vaccination record if obtained from local pharmacy or Health Dept. Verbalized acceptance and understanding. Pneumococcal vaccine status: Up to date Covid-19 vaccine status: Completed vaccines  Qualifies for Shingles Vaccine? Yes   Zostavax completed Yes   Shingrix Completed?: No.    Education has been provided regarding the importance of this vaccine. Patient has been advised to call insurance company to determine out of pocket expense if they have not yet received this vaccine. Advised may also receive vaccine at local pharmacy or Health Dept. Verbalized acceptance and understanding.  Screening Tests Health Maintenance  Topic Date Due  . Hepatitis C Screening  Never done  . INFLUENZA VACCINE  03/22/2020  . TETANUS/TDAP  06/01/2028  . COVID-19 Vaccine  Completed  . PNA vac Low Risk Adult  Completed  . DEXA SCAN  Addressed    Health  Maintenance  Health Maintenance Due  Topic Date Due  . Hepatitis C Screening  Never done  . INFLUENZA VACCINE  03/22/2020    Colorectal cancer screening: No longer required.  Mammogram status: No longer required.  Bone Density status: Completed 04/25/2014.   Lung Cancer Screening: (Low Dose CT Chest recommended if Age 33-80 years, 30 pack-year currently smoking OR have quit w/in 15years.) does not qualify.   Lung Cancer Screening Referral: no  Additional Screening:  Hepatitis C Screening: does qualify;   Vision Screening: Recommended annual ophthalmology exams for early detection of glaucoma and other disorders of the eye. Is the patient up to date with their annual eye exam?  Yes  Who is the provider or what is the name of the office in which the patient attends annual eye exams? [facility] If pt is not established with a provider, would they like to be referred to a provider to establish care? No .   Dental Screening: Recommended annual dental exams for proper oral hygiene  Community Resource Referral / Chronic Care Management: CRR required this visit?  No   CCM required this visit?  No      Plan:     I have personally reviewed and noted the following in the patient's chart:   . Medical and social history . Use of alcohol, tobacco or illicit drugs  . Current medications and supplements . Functional ability and status . Nutritional status . Physical activity . Advanced directives . List of other physicians . Hospitalizations, surgeries, and ER visits in previous 12 months . Vitals . Screenings to include cognitive, depression, and falls . Referrals and appointments  In addition, I have reviewed and discussed with patient certain preventive protocols, quality metrics, and best practice recommendations. A written personalized care plan for preventive services as well as general preventive health recommendations were provided to patient.     Kellie Simmering, LPN   [id number]   Nurse Notes:

## 2020-06-16 LAB — COMPREHENSIVE METABOLIC PANEL
ALT: 20 IU/L (ref 0–32)
AST: 16 IU/L (ref 0–40)
Albumin/Globulin Ratio: 1.8 (ref 1.2–2.2)
Albumin: 4.3 g/dL (ref 3.7–4.7)
Alkaline Phosphatase: 117 IU/L (ref 44–121)
BUN/Creatinine Ratio: 16 (ref 12–28)
BUN: 15 mg/dL (ref 8–27)
Bilirubin Total: <0.2 mg/dL (ref 0.0–1.2)
CO2: 23 mmol/L (ref 20–29)
Calcium: 9.2 mg/dL (ref 8.7–10.3)
Chloride: 105 mmol/L (ref 96–106)
Creatinine, Ser: 0.93 mg/dL (ref 0.57–1.00)
GFR calc Af Amer: 68 mL/min/{1.73_m2} (ref >59)
GFR calc non Af Amer: 59 mL/min/{1.73_m2} — ABNORMAL LOW (ref >59)
Globulin, Total: 2.4 g/dL (ref 1.5–4.5)
Glucose: 84 mg/dL (ref 65–99)
Potassium: 3.9 mmol/L (ref 3.5–5.2)
Sodium: 142 mmol/L (ref 134–144)
Total Protein: 6.7 g/dL (ref 6.0–8.5)

## 2020-06-16 LAB — LIPID PANEL W/O CHOL/HDL RATIO
Cholesterol, Total: 205 mg/dL — ABNORMAL HIGH (ref 100–199)
HDL: 94 mg/dL (ref >39)
LDL Chol Calc (NIH): 96 mg/dL (ref 0–99)
Triglycerides: 88 mg/dL (ref 0–149)
VLDL Cholesterol Cal: 15 mg/dL (ref 5–40)

## 2020-06-16 LAB — CBC WITH DIFFERENTIAL/PLATELET
Basophils Absolute: 0 10*3/uL (ref 0.0–0.2)
Basos: 0 %
EOS (ABSOLUTE): 0.2 10*3/uL (ref 0.0–0.4)
Eos: 3 %
Hematocrit: 40.7 % (ref 34.0–46.6)
Hemoglobin: 13.7 g/dL (ref 11.1–15.9)
Immature Grans (Abs): 0 10*3/uL (ref 0.0–0.1)
Immature Granulocytes: 0 %
Lymphocytes Absolute: 2.1 10*3/uL (ref 0.7–3.1)
Lymphs: 31 %
MCH: 30.9 pg (ref 26.6–33.0)
MCHC: 33.7 g/dL (ref 31.5–35.7)
MCV: 92 fL (ref 79–97)
Monocytes Absolute: 0.7 10*3/uL (ref 0.1–0.9)
Monocytes: 10 %
Neutrophils Absolute: 3.9 10*3/uL (ref 1.4–7.0)
Neutrophils: 56 %
Platelets: 208 10*3/uL (ref 150–450)
RBC: 4.44 x10E6/uL (ref 3.77–5.28)
RDW: 12 % (ref 11.7–15.4)
WBC: 7 10*3/uL (ref 3.4–10.8)

## 2020-06-16 LAB — TSH: TSH: 1.9 u[IU]/mL (ref 0.450–4.500)

## 2020-06-16 LAB — VITAMIN D 25 HYDROXY (VIT D DEFICIENCY, FRACTURES): Vit D, 25-Hydroxy: 30.1 ng/mL (ref 30.0–100.0)

## 2020-06-16 LAB — HEPATITIS C ANTIBODY: Hep C Virus Ab: <0.1 s/co ratio (ref 0.0–0.9)

## 2020-06-17 ENCOUNTER — Telehealth: Payer: Self-pay | Admitting: Family Medicine

## 2020-06-17 NOTE — Telephone Encounter (Signed)
Called to get clarification on the patient's medication for amLODipine (NORVASC) 2.5 MG tablet.  Please call to verify the dosage amount.  CB# [phone]

## 2020-06-17 NOTE — Telephone Encounter (Signed)
Called pharmacy and clarified Amlodipine RX. They wanted to verify that the dose was decreased from 5 mg to 2.5 mg. Verified that this change was correct.

## 2020-09-03 DIAGNOSIS — R251 Tremor, unspecified: Secondary | ICD-10-CM | POA: Diagnosis not present

## 2020-09-03 DIAGNOSIS — R413 Other amnesia: Secondary | ICD-10-CM | POA: Diagnosis not present

## 2020-09-03 DIAGNOSIS — G20 Parkinson's disease: Secondary | ICD-10-CM | POA: Diagnosis not present

## 2020-09-15 ENCOUNTER — Encounter: Payer: Self-pay | Admitting: Family Medicine

## 2020-09-15 ENCOUNTER — Other Ambulatory Visit: Payer: Self-pay

## 2020-09-15 ENCOUNTER — Ambulatory Visit (INDEPENDENT_AMBULATORY_CARE_PROVIDER_SITE_OTHER): Payer: Medicare Other | Admitting: Family Medicine

## 2020-09-15 VITALS — BP 129/79 | HR 80 | Temp 99.1°F | Wt 134.4 lb

## 2020-09-15 DIAGNOSIS — G20A1 Parkinson's disease without dyskinesia, without mention of fluctuations: Secondary | ICD-10-CM | POA: Insufficient documentation

## 2020-09-15 DIAGNOSIS — I129 Hypertensive chronic kidney disease with stage 1 through stage 4 chronic kidney disease, or unspecified chronic kidney disease: Secondary | ICD-10-CM | POA: Diagnosis not present

## 2020-09-15 DIAGNOSIS — G20 Parkinson's disease: Secondary | ICD-10-CM

## 2020-09-15 NOTE — Progress Notes (Signed)
BP 129/79   Pulse 80   Temp 99.1 F (37.3 C) (Oral)   Wt 134 lb 6.4 oz (61 kg)   LMP  (LMP Unknown)   SpO2 93%   BMI 24.58 kg/m    Subjective:    Patient ID: Cynthia Vaughan, female    DOB: 1940-10-24, 80 y.o.   MRN: [id number]  HPI: Cynthia Vaughan is a 80 y.o. female  Chief Complaint  Patient presents with  . Hypertension   HYPERTENSION Hypertension status: controlled  Satisfied with current treatment? yes Duration of hypertension: chronic BP monitoring frequency:  not checking BP medication side effects:  no Medication compliance: excellent compliance Previous BP meds:amlodipine, losartan Aspirin: no Recurrent headaches: no Visual changes: no Palpitations: no Dyspnea: no Chest pain: no Lower extremity edema: no Dizzy/lightheaded: no   Relevant past medical, surgical, family and social history reviewed and updated as indicated. Interim medical history since our last visit reviewed. Allergies and medications reviewed and updated.  Review of Systems  Constitutional: Negative.   Respiratory: Negative.   Cardiovascular: Negative.   Gastrointestinal: Negative.   Musculoskeletal: Negative.   Psychiatric/Behavioral: Negative.     Per HPI unless specifically indicated above     Objective:    BP 129/79   Pulse 80   Temp 99.1 F (37.3 C) (Oral)   Wt 134 lb 6.4 oz (61 kg)   LMP  (LMP Unknown)   SpO2 93%   BMI 24.58 kg/m   Wt Readings from Last 3 Encounters:  09/15/20 134 lb 6.4 oz (61 kg)  06/15/20 133 lb (60.3 kg)  06/15/20 130 lb (59 kg)    Physical Exam Vitals and nursing note reviewed.  Constitutional:      General: She is not in acute distress.    Appearance: Normal appearance. She is not ill-appearing, toxic-appearing or diaphoretic.  HENT:     Head: Normocephalic and atraumatic.     Right Ear: External ear normal.     Left Ear: External ear normal.     Nose: Nose normal.     Mouth/Throat:     Mouth: Mucous membranes are moist.      Pharynx: Oropharynx is clear.  Eyes:     General: No scleral icterus.       Right eye: No discharge.        Left eye: No discharge.     Extraocular Movements: Extraocular movements intact.     Conjunctiva/sclera: Conjunctivae normal.     Pupils: Pupils are equal, round, and reactive to light.  Cardiovascular:     Rate and Rhythm: Normal rate and regular rhythm.     Pulses: Normal pulses.     Heart sounds: Normal heart sounds. No murmur heard. No friction rub. No gallop.   Pulmonary:     Effort: Pulmonary effort is normal. No respiratory distress.     Breath sounds: Normal breath sounds. No stridor. No wheezing, rhonchi or rales.  Chest:     Chest wall: No tenderness.  Musculoskeletal:        General: Normal range of motion.     Cervical back: Normal range of motion and neck supple.  Skin:    General: Skin is warm and dry.     Capillary Refill: Capillary refill takes less than 2 seconds.     Coloration: Skin is not jaundiced or pale.     Findings: No bruising, erythema, lesion or rash.  Neurological:     General: No focal deficit present.  Mental Status: She is alert and oriented to person, place, and time. Mental status is at baseline.  Psychiatric:        Mood and Affect: Mood normal.        Behavior: Behavior normal.        Thought Content: Thought content normal.        Judgment: Judgment normal.     Results for orders placed or performed in visit on 16/60/63  Basic metabolic panel  Result Value Ref Range   Glucose 83 65 - 99 mg/dL   BUN 14 8 - 27 mg/dL   Creatinine, Ser 0.98 0.57 - 1.00 mg/dL   GFR calc non Af Amer 55 (L) >59 mL/min/1.73   GFR calc Af Amer 64 >59 mL/min/1.73   BUN/Creatinine Ratio 14 12 - 28   Sodium 141 134 - 144 mmol/L   Potassium 4.3 3.5 - 5.2 mmol/L   Chloride 103 96 - 106 mmol/L   CO2 23 20 - 29 mmol/L   Calcium 9.2 8.7 - 10.3 mg/dL      Assessment & Plan:   Problem List Items Addressed This Visit      Nervous and Auditory    Parkinson disease ([place])     Genitourinary   Benign hypertensive renal disease - Primary    Under good control on current regimen. Continue current regimen. Continue to monitor. Call with any concerns. Refills given. Labs drawn today.       Relevant Orders   Basic metabolic panel (Completed)       Follow up plan: Return in about 3 months (around 12/14/2020).

## 2020-09-16 LAB — BASIC METABOLIC PANEL
BUN/Creatinine Ratio: 14 (ref 12–28)
BUN: 14 mg/dL (ref 8–27)
CO2: 23 mmol/L (ref 20–29)
Calcium: 9.2 mg/dL (ref 8.7–10.3)
Chloride: 103 mmol/L (ref 96–106)
Creatinine, Ser: 0.98 mg/dL (ref 0.57–1.00)
GFR calc Af Amer: 64 mL/min/{1.73_m2} (ref >59)
GFR calc non Af Amer: 55 mL/min/{1.73_m2} — ABNORMAL LOW (ref >59)
Glucose: 83 mg/dL (ref 65–99)
Potassium: 4.3 mmol/L (ref 3.5–5.2)
Sodium: 141 mmol/L (ref 134–144)

## 2020-09-18 NOTE — Assessment & Plan Note (Signed)
Under good control on current regimen. Continue current regimen. Continue to monitor. Call with any concerns. Refills given. Labs drawn today.   

## 2020-11-27 ENCOUNTER — Other Ambulatory Visit: Payer: Self-pay | Admitting: Family Medicine

## 2020-11-27 NOTE — Telephone Encounter (Signed)
Requested Prescriptions  Pending Prescriptions Disp Refills  . fluticasone (FLONASE) 50 MCG/ACT nasal spray [Pharmacy Med Name: Fluticasone Propionate 66mcg/actuation Nasal Spray] 48 g 0    Sig: Instill 2 sprays in each nostril daily.     Ear, Nose, and Throat: Nasal Preparations - Corticosteroids Passed - 11/27/2020  6:23 AM      Passed - Valid encounter within last 12 months    Recent Outpatient Visits          2 months ago Benign hypertensive renal disease   [facility] [place], Megan P, DO   5 months ago Benign hypertensive renal disease   [facility] [place], [place], DO   11 months ago Benign hypertensive renal disease   [facility] [place], [place], DO   1 year ago Benign hypertensive renal disease   [facility] [place], Megan P, DO   1 year ago Moderate episode of recurrent major depressive disorder [facility])   [place], Barb Merino, DO      Future Appointments            In 2 weeks Wynetta Emery, Barb Merino, DO [place], [place]   In 6 months  [employer], [place]

## 2020-11-27 NOTE — Telephone Encounter (Signed)
Requested Prescriptions  Pending Prescriptions Disp Refills  . amLODipine (NORVASC) 2.5 MG tablet [Pharmacy Med Name: Amlodipine Besylate 2.5mg  Tablet] 90 tablet 1    Sig: Take 1 tablet by mouth daily.     Cardiovascular:  Calcium Channel Blockers Passed - 11/27/2020  6:08 AM      Passed - Last BP in normal range    BP Readings from Last 1 Encounters:  09/15/20 129/79         Passed - Valid encounter within last 6 months    Recent Outpatient Visits          2 months ago Benign hypertensive renal disease   [facility] [place], Megan P, DO   5 months ago Benign hypertensive renal disease   [facility] [place], [place], DO   11 months ago Benign hypertensive renal disease   [facility] [place], [place], DO   1 year ago Benign hypertensive renal disease   [facility] [place], Megan P, DO   1 year ago Moderate episode of recurrent major depressive disorder [facility])   [place], Barb Merino, DO      Future Appointments            In 2 weeks Wynetta Emery, Barb Merino, DO [place], [place]   In 6 months  [employer], [place]

## 2020-12-14 ENCOUNTER — Ambulatory Visit: Payer: Medicare Other | Admitting: Family Medicine

## 2020-12-17 DIAGNOSIS — H0015 Chalazion left lower eyelid: Secondary | ICD-10-CM | POA: Diagnosis not present

## 2020-12-24 ENCOUNTER — Other Ambulatory Visit: Payer: Self-pay

## 2020-12-24 ENCOUNTER — Encounter: Payer: Self-pay | Admitting: Family Medicine

## 2020-12-24 ENCOUNTER — Ambulatory Visit (INDEPENDENT_AMBULATORY_CARE_PROVIDER_SITE_OTHER): Payer: Medicare Other | Admitting: Family Medicine

## 2020-12-24 VITALS — BP 125/80 | HR 72 | Temp 98.4°F | Wt 128.6 lb

## 2020-12-24 DIAGNOSIS — K219 Gastro-esophageal reflux disease without esophagitis: Secondary | ICD-10-CM | POA: Diagnosis not present

## 2020-12-24 DIAGNOSIS — F09 Unspecified mental disorder due to known physiological condition: Secondary | ICD-10-CM

## 2020-12-24 DIAGNOSIS — E559 Vitamin D deficiency, unspecified: Secondary | ICD-10-CM

## 2020-12-24 DIAGNOSIS — N182 Chronic kidney disease, stage 2 (mild): Secondary | ICD-10-CM

## 2020-12-24 DIAGNOSIS — E782 Mixed hyperlipidemia: Secondary | ICD-10-CM

## 2020-12-24 DIAGNOSIS — I129 Hypertensive chronic kidney disease with stage 1 through stage 4 chronic kidney disease, or unspecified chronic kidney disease: Secondary | ICD-10-CM

## 2020-12-24 DIAGNOSIS — F331 Major depressive disorder, recurrent, moderate: Secondary | ICD-10-CM

## 2020-12-24 DIAGNOSIS — F419 Anxiety disorder, unspecified: Secondary | ICD-10-CM | POA: Diagnosis not present

## 2020-12-24 MED ORDER — AMLODIPINE BESYLATE 2.5 MG PO TABS: 2.5000 mg | ORAL_TABLET | Freq: Every day | ORAL | 1 refills | Status: DC

## 2020-12-24 MED ORDER — ATORVASTATIN CALCIUM 40 MG PO TABS: 40.0000 mg | ORAL_TABLET | Freq: Every day | ORAL | 1 refills | Status: DC

## 2020-12-24 MED ORDER — DULOXETINE HCL 30 MG PO CPEP: 30.0000 mg | ORAL_CAPSULE | Freq: Every day | ORAL | 1 refills | Status: DC

## 2020-12-24 MED ORDER — OMEPRAZOLE 20 MG PO CPDR: 20.0000 mg | DELAYED_RELEASE_CAPSULE | Freq: Every day | ORAL | 1 refills | Status: DC

## 2020-12-24 MED ORDER — BUPROPION HCL 75 MG PO TABS: 75.0000 mg | ORAL_TABLET | Freq: Every day | ORAL | 1 refills | Status: DC

## 2020-12-24 MED ORDER — FLUTICASONE PROPIONATE 50 MCG/ACT NA SUSP: 2.0000 | Freq: Every day | NASAL | 3 refills | Status: DC

## 2020-12-24 MED ORDER — LOSARTAN POTASSIUM 50 MG PO TABS: 50.0000 mg | ORAL_TABLET | Freq: Every day | ORAL | 1 refills | Status: DC

## 2020-12-24 MED ORDER — GABAPENTIN 100 MG PO CAPS: 100.0000 mg | ORAL_CAPSULE | Freq: Three times a day (TID) | ORAL | 1 refills | Status: DC

## 2020-12-24 NOTE — Assessment & Plan Note (Signed)
Rechecking labs today. Await results. Treat as needed.  °

## 2020-12-24 NOTE — Assessment & Plan Note (Signed)
Under good control on current regimen. Continue current regimen. Continue to monitor. Call with any concerns. Refills given. Labs drawn today.   

## 2020-12-24 NOTE — Progress Notes (Signed)
BP 125/80   Pulse 72   Temp 98.4 F (36.9 C)   Wt 128 lb 9.6 oz (58.3 kg)   LMP  (LMP Unknown)   SpO2 95%   BMI 23.52 kg/m    Subjective:    Patient ID: Cynthia Vaughan, female    DOB: Feb 11, 1941, 80 y.o.   MRN: [id number]  HPI: AVERILL Vaughan is a 80 y.o. female  Chief Complaint  Patient presents with  . Gastroesophageal Reflux  . Hyperlipidemia  . Depression  . Anxiety   HYPERTENSION / HYPERLIPIDEMIA Satisfied with current treatment? yes Duration of hypertension: chronic BP monitoring frequency: not checking BP medication side effects: no Past BP meds: amlodipine, losartan Duration of hyperlipidemia: chronic Cholesterol medication side effects: no Cholesterol supplements: none Past cholesterol medications: atorvasatatin Medication compliance: excellent compliance Aspirin: yes Recent stressors: no Recurrent headaches: no Visual changes: no Palpitations: no Dyspnea: no Chest pain: no Lower extremity edema: no Dizzy/lightheaded: no  GERD GERD control status: controlled  Satisfied with current treatment? yes Heartburn frequency: not on the medicine Medication side effects: no  Medication compliance: stable Dysphagia: no Odynophagia:  no Hematemesis: no Blood in stool: no EGD: no  ANXIETY/DEPRESSION Duration: chronic Status:controlled Anxious mood: no  Excessive worrying: no Irritability: no  Sweating: no Nausea: no Palpitations:no Hyperventilation: no Panic attacks: no Agoraphobia: no  Obscessions/compulsions: no Depressed mood: no Depression screen [facility] 2/9 12/24/2020 09/15/2020 06/15/2020 06/15/2020 12/30/2019  Decreased Interest 0 0 0 0 0  Down, Depressed, Hopeless 0 0 0 0 0  PHQ - 2 Score 0 0 0 0 0  Altered sleeping 1 1 0 - 0  Tired, decreased energy 1 0 0 - 0  Change in appetite 0 0 0 - 0  Feeling bad or failure about yourself  0 0 0 - 0  Trouble concentrating 0 0 0 - 0  Moving slowly or fidgety/restless 0 0 0 - 0  Suicidal thoughts 0  0 0 - 0  PHQ-9 Score 2 1 0 - 0  Difficult doing work/chores Not difficult at all Not difficult at all Not difficult at all - Not difficult at all  Some recent data might be hidden   Anhedonia: no Weight changes: no Insomnia: no   Hypersomnia: no Fatigue/loss of energy: yes Feelings of worthlessness: no Feelings of guilt: no Impaired concentration/indecisiveness: no Suicidal ideations: no  Crying spells: no Recent Stressors/Life Changes: no   Relationship problems: no   Family stress: no     Financial stress: no    Job stress: no    Recent death/loss: no  Relevant past medical, surgical, family and social history reviewed and updated as indicated. Interim medical history since our last visit reviewed. Allergies and medications reviewed and updated.  Review of Systems  Constitutional: Negative.   Respiratory: Negative.   Cardiovascular: Negative.   Gastrointestinal: Negative.   Musculoskeletal: Negative.   Neurological: Negative.   Psychiatric/Behavioral: Negative.     Per HPI unless specifically indicated above     Objective:    BP 125/80   Pulse 72   Temp 98.4 F (36.9 C)   Wt 128 lb 9.6 oz (58.3 kg)   LMP  (LMP Unknown)   SpO2 95%   BMI 23.52 kg/m   Wt Readings from Last 3 Encounters:  12/24/20 128 lb 9.6 oz (58.3 kg)  09/15/20 134 lb 6.4 oz (61 kg)  06/15/20 133 lb (60.3 kg)    Physical Exam Vitals and nursing note reviewed.  Constitutional:  General: She is not in acute distress.    Appearance: Normal appearance. She is not ill-appearing, toxic-appearing or diaphoretic.  HENT:     Head: Normocephalic and atraumatic.     Right Ear: External ear normal.     Left Ear: External ear normal.     Nose: Nose normal.     Mouth/Throat:     Mouth: Mucous membranes are moist.     Pharynx: Oropharynx is clear.  Eyes:     General: No scleral icterus.       Right eye: No discharge.        Left eye: No discharge.     Extraocular Movements: Extraocular  movements intact.     Conjunctiva/sclera: Conjunctivae normal.     Pupils: Pupils are equal, round, and reactive to light.  Cardiovascular:     Rate and Rhythm: Normal rate and regular rhythm.     Pulses: Normal pulses.     Heart sounds: Normal heart sounds. No murmur heard. No friction rub. No gallop.   Pulmonary:     Effort: Pulmonary effort is normal. No respiratory distress.     Breath sounds: Normal breath sounds. No stridor. No wheezing, rhonchi or rales.  Chest:     Chest wall: No tenderness.  Musculoskeletal:        General: Normal range of motion.     Cervical back: Normal range of motion and neck supple.  Skin:    General: Skin is warm and dry.     Capillary Refill: Capillary refill takes less than 2 seconds.     Coloration: Skin is not jaundiced or pale.     Findings: No bruising, erythema, lesion or rash.  Neurological:     General: No focal deficit present.     Mental Status: She is alert and oriented to person, place, and time. Mental status is at baseline.  Psychiatric:        Mood and Affect: Mood normal.        Behavior: Behavior normal.        Thought Content: Thought content normal.        Judgment: Judgment normal.     Results for orders placed or performed in visit on 59/56/38  Basic metabolic panel  Result Value Ref Range   Glucose 83 65 - 99 mg/dL   BUN 14 8 - 27 mg/dL   Creatinine, Ser 0.98 0.57 - 1.00 mg/dL   GFR calc non Af Amer 55 (L) >59 mL/min/1.73   GFR calc Af Amer 64 >59 mL/min/1.73   BUN/Creatinine Ratio 14 12 - 28   Sodium 141 134 - 144 mmol/L   Potassium 4.3 3.5 - 5.2 mmol/L   Chloride 103 96 - 106 mmol/L   CO2 23 20 - 29 mmol/L   Calcium 9.2 8.7 - 10.3 mg/dL      Assessment & Plan:   Problem List Items Addressed This Visit      Digestive   GERD (gastroesophageal reflux disease) - Primary    Under good control on current regimen. Continue current regimen. Continue to monitor. Call with any concerns. Refills given. Labs drawn  today.        Relevant Medications   omeprazole (PRILOSEC) 20 MG capsule   Other Relevant Orders   Comprehensive metabolic panel   CBC with Differential/Platelet     Nervous and Auditory   Mild cognitive disorder    Under good control on current regimen. Continue current regimen. Continue to monitor. Call with any  concerns. Refills through neurology. Continue to follow with neurology.        Genitourinary   Benign hypertensive renal disease    Under good control on current regimen. Continue current regimen. Continue to monitor. Call with any concerns. Refills given. Labs drawn today.       Relevant Orders   Comprehensive metabolic panel   TSH   CKD (chronic kidney disease) stage 2, GFR 60-89 ml/min    Rechecking labs today. Await results. Treat as needed.         Other   Anxiety    Under good control on current regimen. Continue current regimen. Continue to monitor. Call with any concerns. Refills given. Labs drawn today.       Relevant Medications   DULoxetine (CYMBALTA) 30 MG capsule   buPROPion (WELLBUTRIN) 75 MG tablet   Other Relevant Orders   Comprehensive metabolic panel   Hyperlipidemia    Under good control on current regimen. Continue current regimen. Continue to monitor. Call with any concerns. Refills given. Labs drawn today.       Relevant Medications   losartan (COZAAR) 50 MG tablet   atorvastatin (LIPITOR) 40 MG tablet   amLODipine (NORVASC) 2.5 MG tablet   Other Relevant Orders   Comprehensive metabolic panel   Lipid Panel w/o Chol/HDL Ratio   Vitamin D deficiency disease    Rechecking labs today. Await results. Treat as needed.       Relevant Orders   Comprehensive metabolic panel   VITAMIN D 25 Hydroxy (Vit-D Deficiency, Fractures)   MDD (major depressive disorder), recurrent episode, moderate (HCC)    Under good control on current regimen. Continue current regimen. Continue to monitor. Call with any concerns. Refills given. Labs drawn  today.       Relevant Medications   DULoxetine (CYMBALTA) 30 MG capsule   buPROPion (WELLBUTRIN) 75 MG tablet   Other Relevant Orders   Comprehensive metabolic panel   TSH       Follow up plan: Return in about 3 months (around 03/26/2021).

## 2020-12-24 NOTE — Assessment & Plan Note (Signed)
Under good control on current regimen. Continue current regimen. Continue to monitor. Call with any concerns. Refills through neurology. Continue to follow with neurology.

## 2020-12-25 LAB — TSH: TSH: 3.38 u[IU]/mL (ref 0.450–4.500)

## 2020-12-25 LAB — COMPREHENSIVE METABOLIC PANEL
ALT: 13 IU/L (ref 0–32)
AST: 11 IU/L (ref 0–40)
Albumin/Globulin Ratio: 1.5 (ref 1.2–2.2)
Albumin: 4.1 g/dL (ref 3.7–4.7)
Alkaline Phosphatase: 130 IU/L — ABNORMAL HIGH (ref 44–121)
BUN/Creatinine Ratio: 17 (ref 12–28)
BUN: 14 mg/dL (ref 8–27)
Bilirubin Total: 0.3 mg/dL (ref 0.0–1.2)
CO2: 22 mmol/L (ref 20–29)
Calcium: 9.2 mg/dL (ref 8.7–10.3)
Chloride: 102 mmol/L (ref 96–106)
Creatinine, Ser: 0.82 mg/dL (ref 0.57–1.00)
Globulin, Total: 2.7 g/dL (ref 1.5–4.5)
Glucose: 130 mg/dL — ABNORMAL HIGH (ref 65–99)
Potassium: 4.1 mmol/L (ref 3.5–5.2)
Sodium: 140 mmol/L (ref 134–144)
Total Protein: 6.8 g/dL (ref 6.0–8.5)
eGFR: 73 mL/min/{1.73_m2} (ref >59)

## 2020-12-25 LAB — CBC WITH DIFFERENTIAL/PLATELET
Basophils Absolute: 0 10*3/uL (ref 0.0–0.2)
Basos: 0 %
EOS (ABSOLUTE): 0.2 10*3/uL (ref 0.0–0.4)
Eos: 3 %
Hematocrit: 42.7 % (ref 34.0–46.6)
Hemoglobin: 14.2 g/dL (ref 11.1–15.9)
Immature Grans (Abs): 0 10*3/uL (ref 0.0–0.1)
Immature Granulocytes: 0 %
Lymphocytes Absolute: 1.4 10*3/uL (ref 0.7–3.1)
Lymphs: 23 %
MCH: 30.6 pg (ref 26.6–33.0)
MCHC: 33.3 g/dL (ref 31.5–35.7)
MCV: 92 fL (ref 79–97)
Monocytes Absolute: 0.4 10*3/uL (ref 0.1–0.9)
Monocytes: 7 %
Neutrophils Absolute: 3.9 10*3/uL (ref 1.4–7.0)
Neutrophils: 67 %
Platelets: 167 10*3/uL (ref 150–450)
RBC: 4.64 x10E6/uL (ref 3.77–5.28)
RDW: 12.5 % (ref 11.7–15.4)
WBC: 5.9 10*3/uL (ref 3.4–10.8)

## 2020-12-25 LAB — LIPID PANEL W/O CHOL/HDL RATIO
Cholesterol, Total: 205 mg/dL — ABNORMAL HIGH (ref 100–199)
HDL: 83 mg/dL (ref >39)
LDL Chol Calc (NIH): 104 mg/dL — ABNORMAL HIGH (ref 0–99)
Triglycerides: 101 mg/dL (ref 0–149)
VLDL Cholesterol Cal: 18 mg/dL (ref 5–40)

## 2020-12-25 LAB — VITAMIN D 25 HYDROXY (VIT D DEFICIENCY, FRACTURES): Vit D, 25-Hydroxy: 27 ng/mL — ABNORMAL LOW (ref 30.0–100.0)

## 2021-03-17 DIAGNOSIS — G20 Parkinson's disease: Secondary | ICD-10-CM | POA: Diagnosis not present

## 2021-03-17 DIAGNOSIS — R200 Anesthesia of skin: Secondary | ICD-10-CM | POA: Diagnosis not present

## 2021-03-17 DIAGNOSIS — R202 Paresthesia of skin: Secondary | ICD-10-CM | POA: Diagnosis not present

## 2021-03-17 DIAGNOSIS — R413 Other amnesia: Secondary | ICD-10-CM | POA: Diagnosis not present

## 2021-03-17 DIAGNOSIS — R251 Tremor, unspecified: Secondary | ICD-10-CM | POA: Diagnosis not present

## 2021-03-26 ENCOUNTER — Other Ambulatory Visit: Payer: Self-pay

## 2021-03-26 ENCOUNTER — Encounter: Payer: Self-pay | Admitting: Family Medicine

## 2021-03-26 ENCOUNTER — Telehealth: Payer: Self-pay

## 2021-03-26 ENCOUNTER — Ambulatory Visit (INDEPENDENT_AMBULATORY_CARE_PROVIDER_SITE_OTHER): Payer: Medicare Other | Admitting: Family Medicine

## 2021-03-26 VITALS — BP 115/75 | HR 70 | Temp 98.3°F | Ht 62.01 in | Wt 127.0 lb

## 2021-03-26 DIAGNOSIS — F331 Major depressive disorder, recurrent, moderate: Secondary | ICD-10-CM | POA: Diagnosis not present

## 2021-03-26 DIAGNOSIS — E782 Mixed hyperlipidemia: Secondary | ICD-10-CM

## 2021-03-26 DIAGNOSIS — E559 Vitamin D deficiency, unspecified: Secondary | ICD-10-CM | POA: Diagnosis not present

## 2021-03-26 DIAGNOSIS — K219 Gastro-esophageal reflux disease without esophagitis: Secondary | ICD-10-CM | POA: Diagnosis not present

## 2021-03-26 DIAGNOSIS — I129 Hypertensive chronic kidney disease with stage 1 through stage 4 chronic kidney disease, or unspecified chronic kidney disease: Secondary | ICD-10-CM | POA: Diagnosis not present

## 2021-03-26 DIAGNOSIS — N182 Chronic kidney disease, stage 2 (mild): Secondary | ICD-10-CM

## 2021-03-26 NOTE — Assessment & Plan Note (Signed)
Under good control on current regimen. Continue current regimen. Continue to monitor. Call with any concerns. Refills given. Labs drawn today.   

## 2021-03-26 NOTE — Telephone Encounter (Signed)
Pharmacy notified to D/C medication

## 2021-03-26 NOTE — Telephone Encounter (Signed)
-----   Message from [name], [place] sent at 03/26/2021  2:02 PM EDT ----- Please call pill pack and let them know we're stopping the wellbutrin- please do not send it in next pack. Thanks!

## 2021-03-26 NOTE — Assessment & Plan Note (Signed)
Doing well. Will try cutting back on meds- stop wellbutrin. Recheck 3 months. Call with any concerns.

## 2021-03-26 NOTE — Assessment & Plan Note (Signed)
Rechecking labs today. Await results. Treat as needed.  °

## 2021-03-26 NOTE — Patient Instructions (Signed)
STOP the wellbutrin- call me if she seems more down. Let's get rid of some pills.

## 2021-03-26 NOTE — Progress Notes (Signed)
BP 115/75   Pulse 70   Temp 98.3 F (36.8 C) (Oral)   Ht 5' 2.01" (1.575 m)   Wt 127 lb (57.6 kg)   LMP  (LMP Unknown)   SpO2 96%   BMI 23.22 kg/m    Subjective:    Patient ID: Cynthia Vaughan, female    DOB: April 19, 1941, 80 y.o.   MRN: [id number]  HPI: Cynthia Vaughan is a 80 y.o. female  Chief Complaint  Patient presents with   Hypertension   Hyperlipidemia   Depression    HYPERTENSION / [place] Satisfied with current treatment? yes Duration of hypertension: chronic BP monitoring frequency: not checking BP medication side effects: no Past BP meds: amlodipine, losartan Duration of hyperlipidemia: chronic Cholesterol medication side effects: no Cholesterol supplements: none Past cholesterol medications: atorvastatin Medication compliance: excellent compliance Aspirin: yes Recent stressors: no Recurrent headaches: no Visual changes: no Palpitations: no Dyspnea: no Chest pain: no Lower extremity edema: no Dizzy/lightheaded: no  DEPRESSION Mood status: controlled Satisfied with current treatment?: yes Symptom severity: mild  Duration of current treatment : chronic Side effects: no Medication compliance: excellent compliance Psychotherapy/counseling: no  Previous psychiatric medications: cymbalta and wellbutrin Depressed mood: no Anxious mood: no Anhedonia: no Significant weight loss or gain: no Insomnia: no  Fatigue: no Feelings of worthlessness or guilt: no Impaired concentration/indecisiveness: no Suicidal ideations: no Hopelessness: no Crying spells: no Depression screen [facility] 2/9 03/26/2021 12/24/2020 09/15/2020 06/15/2020 06/15/2020  Decreased Interest 0 0 0 0 0  Down, Depressed, Hopeless 0 0 0 0 0  PHQ - 2 Score 0 0 0 0 0  Altered sleeping - 1 1 0 -  Tired, decreased energy - 1 0 0 -  Change in appetite - 0 0 0 -  Feeling bad or failure about yourself  - 0 0 0 -  Trouble concentrating - 0 0 0 -  Moving slowly or fidgety/restless - 0 0 0  -  Suicidal thoughts - 0 0 0 -  PHQ-9 Score - 2 1 0 -  Difficult doing work/chores - Not difficult at all Not difficult at all Not difficult at all -  Some recent data might be hidden    Relevant past medical, surgical, family and social history reviewed and updated as indicated. Interim medical history since our last visit reviewed. Allergies and medications reviewed and updated.  Review of Systems  Constitutional: Negative.   Respiratory: Negative.    Cardiovascular: Negative.   Gastrointestinal: Negative.   Musculoskeletal: Negative.   Neurological: Negative.   Psychiatric/Behavioral: Negative.     Per HPI unless specifically indicated above     Objective:    BP 115/75   Pulse 70   Temp 98.3 F (36.8 C) (Oral)   Ht 5' 2.01" (1.575 m)   Wt 127 lb (57.6 kg)   LMP  (LMP Unknown)   SpO2 96%   BMI 23.22 kg/m   Wt Readings from Last 3 Encounters:  03/26/21 127 lb (57.6 kg)  12/24/20 128 lb 9.6 oz (58.3 kg)  09/15/20 134 lb 6.4 oz (61 kg)    Physical Exam Vitals and nursing note reviewed.  Constitutional:      General: She is not in acute distress.    Appearance: Normal appearance. She is not ill-appearing, toxic-appearing or diaphoretic.  HENT:     Head: Normocephalic and atraumatic.     Right Ear: External ear normal.     Left Ear: External ear normal.     Nose: Nose normal.  Mouth/Throat:     Mouth: Mucous membranes are moist.     Pharynx: Oropharynx is clear.  Eyes:     General: No scleral icterus.       Right eye: No discharge.        Left eye: No discharge.     Extraocular Movements: Extraocular movements intact.     Conjunctiva/sclera: Conjunctivae normal.     Pupils: Pupils are equal, round, and reactive to light.  Cardiovascular:     Rate and Rhythm: Normal rate and regular rhythm.     Pulses: Normal pulses.     Heart sounds: Normal heart sounds. No murmur heard.   No friction rub. No gallop.  Pulmonary:     Effort: Pulmonary effort is normal.  No respiratory distress.     Breath sounds: Normal breath sounds. No stridor. No wheezing, rhonchi or rales.  Chest:     Chest wall: No tenderness.  Musculoskeletal:        General: Normal range of motion.     Cervical back: Normal range of motion and neck supple.  Skin:    General: Skin is warm and dry.     Capillary Refill: Capillary refill takes less than 2 seconds.     Coloration: Skin is not jaundiced or pale.     Findings: No bruising, erythema, lesion or rash.  Neurological:     General: No focal deficit present.     Mental Status: She is alert and oriented to person, place, and time. Mental status is at baseline.  Psychiatric:        Mood and Affect: Mood normal.        Behavior: Behavior normal.        Thought Content: Thought content normal.        Judgment: Judgment normal.    Results for orders placed or performed in visit on 12/24/20  Comprehensive metabolic panel  Result Value Ref Range   Glucose 130 (H) 65 - 99 mg/dL   BUN 14 8 - 27 mg/dL   Creatinine, Ser 0.82 0.57 - 1.00 mg/dL   eGFR 73 >59 mL/min/1.73   BUN/Creatinine Ratio 17 12 - 28   Sodium 140 134 - 144 mmol/L   Potassium 4.1 3.5 - 5.2 mmol/L   Chloride 102 96 - 106 mmol/L   CO2 22 20 - 29 mmol/L   Calcium 9.2 8.7 - 10.3 mg/dL   Total Protein 6.8 6.0 - 8.5 g/dL   Albumin 4.1 3.7 - 4.7 g/dL   Globulin, Total 2.7 1.5 - 4.5 g/dL   Albumin/Globulin Ratio 1.5 1.2 - 2.2   Bilirubin Total 0.3 0.0 - 1.2 mg/dL   Alkaline Phosphatase 130 (H) 44 - 121 IU/L   AST 11 0 - 40 IU/L   ALT 13 0 - 32 IU/L  CBC with Differential/Platelet  Result Value Ref Range   WBC 5.9 3.4 - 10.8 x10E3/uL   RBC 4.64 3.77 - 5.28 x10E6/uL   Hemoglobin 14.2 11.1 - 15.9 g/dL   Hematocrit 42.7 34.0 - 46.6 %   MCV 92 79 - 97 fL   MCH 30.6 26.6 - 33.0 pg   MCHC 33.3 31.5 - 35.7 g/dL   RDW 12.5 11.7 - 15.4 %   Platelets 167 150 - 450 x10E3/uL   Neutrophils 67 Not Estab. %   Lymphs 23 Not Estab. %   Monocytes 7 Not Estab. %   Eos 3  Not Estab. %   Basos 0 Not Estab. %  Neutrophils Absolute 3.9 1.4 - 7.0 x10E3/uL   Lymphocytes Absolute 1.4 0.7 - 3.1 x10E3/uL   Monocytes Absolute 0.4 0.1 - 0.9 x10E3/uL   EOS (ABSOLUTE) 0.2 0.0 - 0.4 x10E3/uL   Basophils Absolute 0.0 0.0 - 0.2 x10E3/uL   Immature Granulocytes 0 Not Estab. %   Immature Grans (Abs) 0.0 0.0 - 0.1 x10E3/uL  Lipid Panel w/o Chol/HDL Ratio  Result Value Ref Range   Cholesterol, Total 205 (H) 100 - 199 mg/dL   Triglycerides 101 0 - 149 mg/dL   HDL 83 >39 mg/dL   VLDL Cholesterol Cal 18 5 - 40 mg/dL   LDL Chol Calc (NIH) 104 (H) 0 - 99 mg/dL  TSH  Result Value Ref Range   TSH 3.380 0.450 - 4.500 uIU/mL  VITAMIN D 25 Hydroxy (Vit-D Deficiency, Fractures)  Result Value Ref Range   Vit D, 25-Hydroxy 27.0 (L) 30.0 - 100.0 ng/mL      Assessment & Plan:   Problem List Items Addressed This Visit       Digestive   GERD (gastroesophageal reflux disease) - Primary    Under good control on current regimen. Continue current regimen. Continue to monitor. Call with any concerns. Refills given. Labs drawn today.        Relevant Orders   Comprehensive metabolic panel   CBC with Differential/Platelet     Genitourinary   Benign hypertensive renal disease    Under good control on current regimen. Continue current regimen. Continue to monitor. Call with any concerns. Refills given. Labs drawn today.       Relevant Orders   Comprehensive metabolic panel   CBC with Differential/Platelet   TSH   Urinalysis, Routine w reflex microscopic   CKD (chronic kidney disease) stage 2, GFR 60-89 ml/min    Rechecking labs today. Await results. Treat as needed.       Relevant Orders   Comprehensive metabolic panel   CBC with Differential/Platelet     Other   Hyperlipidemia    Under good control on current regimen. Continue current regimen. Continue to monitor. Call with any concerns. Refills given. Labs drawn today.      Relevant Orders   Comprehensive  metabolic panel   CBC with Differential/Platelet   Lipid Panel w/o Chol/HDL Ratio   Vitamin D deficiency disease    Rechecking labs today. Await results. Treat as needed.       Relevant Orders   Comprehensive metabolic panel   CBC with Differential/Platelet   VITAMIN D 25 Hydroxy (Vit-D Deficiency, Fractures)   MDD (major depressive disorder), recurrent episode, moderate ([place])    Doing well. Will try cutting back on meds- stop wellbutrin. Recheck 3 months. Call with any concerns.       Relevant Medications   traZODone (DESYREL) 50 MG tablet   Other Relevant Orders   Comprehensive metabolic panel   CBC with Differential/Platelet   TSH     Follow up plan: Return in about 3 months (around 06/26/2021).

## 2021-03-27 LAB — CBC WITH DIFFERENTIAL/PLATELET
Basophils Absolute: 0 10*3/uL (ref 0.0–0.2)
Basos: 1 %
EOS (ABSOLUTE): 0.2 10*3/uL (ref 0.0–0.4)
Eos: 3 %
Hematocrit: 41.6 % (ref 34.0–46.6)
Hemoglobin: 14.1 g/dL (ref 11.1–15.9)
Immature Grans (Abs): 0 10*3/uL (ref 0.0–0.1)
Immature Granulocytes: 1 %
Lymphocytes Absolute: 2.1 10*3/uL (ref 0.7–3.1)
Lymphs: 33 %
MCH: 30.9 pg (ref 26.6–33.0)
MCHC: 33.9 g/dL (ref 31.5–35.7)
MCV: 91 fL (ref 79–97)
Monocytes Absolute: 0.6 10*3/uL (ref 0.1–0.9)
Monocytes: 9 %
Neutrophils Absolute: 3.4 10*3/uL (ref 1.4–7.0)
Neutrophils: 53 %
Platelets: 213 10*3/uL (ref 150–450)
RBC: 4.56 x10E6/uL (ref 3.77–5.28)
RDW: 12.5 % (ref 11.7–15.4)
WBC: 6.2 10*3/uL (ref 3.4–10.8)

## 2021-03-27 LAB — COMPREHENSIVE METABOLIC PANEL
ALT: 8 IU/L (ref 0–32)
AST: 15 IU/L (ref 0–40)
Albumin/Globulin Ratio: 1.6 (ref 1.2–2.2)
Albumin: 4.3 g/dL (ref 3.7–4.7)
Alkaline Phosphatase: 112 IU/L (ref 44–121)
BUN/Creatinine Ratio: 15 (ref 12–28)
BUN: 13 mg/dL (ref 8–27)
Bilirubin Total: 0.3 mg/dL (ref 0.0–1.2)
CO2: 23 mmol/L (ref 20–29)
Calcium: 9.7 mg/dL (ref 8.7–10.3)
Chloride: 102 mmol/L (ref 96–106)
Creatinine, Ser: 0.89 mg/dL (ref 0.57–1.00)
Globulin, Total: 2.7 g/dL (ref 1.5–4.5)
Glucose: 109 mg/dL — ABNORMAL HIGH (ref 65–99)
Potassium: 4.2 mmol/L (ref 3.5–5.2)
Sodium: 142 mmol/L (ref 134–144)
Total Protein: 7 g/dL (ref 6.0–8.5)
eGFR: 66 mL/min/{1.73_m2} (ref >59)

## 2021-03-27 LAB — LIPID PANEL W/O CHOL/HDL RATIO
Cholesterol, Total: 202 mg/dL — ABNORMAL HIGH (ref 100–199)
HDL: 91 mg/dL (ref >39)
LDL Chol Calc (NIH): 94 mg/dL (ref 0–99)
Triglycerides: 100 mg/dL (ref 0–149)
VLDL Cholesterol Cal: 17 mg/dL (ref 5–40)

## 2021-03-27 LAB — VITAMIN D 25 HYDROXY (VIT D DEFICIENCY, FRACTURES): Vit D, 25-Hydroxy: 26.3 ng/mL — ABNORMAL LOW (ref 30.0–100.0)

## 2021-03-27 LAB — TSH: TSH: 1.86 u[IU]/mL (ref 0.450–4.500)

## 2021-04-14 ENCOUNTER — Other Ambulatory Visit: Payer: Self-pay

## 2021-04-14 ENCOUNTER — Ambulatory Visit (INDEPENDENT_AMBULATORY_CARE_PROVIDER_SITE_OTHER): Payer: Medicare Other | Admitting: Dermatology

## 2021-04-14 DIAGNOSIS — D229 Melanocytic nevi, unspecified: Secondary | ICD-10-CM

## 2021-04-14 DIAGNOSIS — L820 Inflamed seborrheic keratosis: Secondary | ICD-10-CM | POA: Diagnosis not present

## 2021-04-14 DIAGNOSIS — D1800 Hemangioma unspecified site: Secondary | ICD-10-CM

## 2021-04-14 DIAGNOSIS — L578 Other skin changes due to chronic exposure to nonionizing radiation: Secondary | ICD-10-CM

## 2021-04-14 DIAGNOSIS — D485 Neoplasm of uncertain behavior of skin: Secondary | ICD-10-CM | POA: Diagnosis not present

## 2021-04-14 DIAGNOSIS — L986 Other infiltrative disorders of the skin and subcutaneous tissue: Secondary | ICD-10-CM | POA: Diagnosis not present

## 2021-04-14 HISTORY — DX: Melanocytic nevi, unspecified: D22.9

## 2021-04-14 NOTE — Patient Instructions (Signed)
Wound Care Instructions  Cleanse wound gently with soap and water once a day then pat dry with clean gauze. Apply a thing coat of Petrolatum (petroleum jelly, "Vaseline") over the wound (unless you have an allergy to this). We recommend that you use a new, sterile tube of Vaseline. Do not pick or remove scabs. Do not remove the yellow or white "healing tissue" from the base of the wound.  Cover the wound with fresh, clean, nonstick gauze and secure with paper tape. You may use Band-Aids in place of gauze and tape if the would is small enough, but would recommend trimming much of the tape off as there is often too much. Sometimes Band-Aids can irritate the skin.  You should call the office for your biopsy report after 1 week if you have not already been contacted.  If you experience any problems, such as abnormal amounts of bleeding, swelling, significant bruising, significant pain, or evidence of infection, please call the office immediately.  FOR ADULT SURGERY PATIENTS: If you need something for pain relief you may take 1 extra strength Tylenol (acetaminophen) AND 2 Ibuprofen (200mg each) together every 4 hours as needed for pain. (do not take these if you are allergic to them or if you have a reason you should not take them.) Typically, you may only need pain medication for 1 to 3 days.    

## 2021-04-14 NOTE — Progress Notes (Signed)
   Follow-Up Visit   Subjective  Cynthia Vaughan is a 80 y.o. female who presents for the following: Spot Check (Pt has a spot on her left abd that she states has been there but recently started to itch. Pt also has a newer spot on her right arm that she has noticed that she would like checked today. ).    The following portions of the chart were reviewed this encounter and updated as appropriate:      Review of Systems: No other skin or systemic complaints except as noted in HPI or Assessment and Plan.   Objective  Well appearing patient in no apparent distress; mood and affect are within normal limits.  A focused examination was performed including right arm and abdomen. Relevant physical exam findings are noted in the Assessment and Plan.  Left Abdomen x 1 Erythematous keratotic or waxy stuck-on papule  Right lower elbow 8 mm irregular brown macule         Assessment & Plan  Inflamed seborrheic keratosis Left Abdomen x 1     Destruction of lesion - Left Abdomen x 1  Destruction method: cryotherapy   Informed consent: discussed and consent obtained   Lesion destroyed using liquid nitrogen: Yes   Region frozen until ice ball extended beyond lesion: Yes   Outcome: patient tolerated procedure well with no complications   Post-procedure details: wound care instructions given   Additional details:  Prior to procedure, discussed risks of blister formation, small wound, skin dyspigmentation, or rare scar following cryotherapy. Recommend Vaseline ointment to treated areas while healing.   Neoplasm of uncertain behavior of skin Right lower elbow  Epidermal / dermal shaving  Lesion diameter (cm):  1 Informed consent: discussed and consent obtained   Patient was prepped and draped in usual sterile fashion: Area prepped with alcohol. Anesthesia: the lesion was anesthetized in a standard fashion   Anesthetic:  1% lidocaine w/ epinephrine 1-100,000 buffered w/ 8.4%  NaHCO3 Instrument used: flexible razor blade   Hemostasis achieved with: pressure, aluminum chloride and electrodesiccation   Outcome: patient tolerated procedure well   Post-procedure details: wound care instructions given   Post-procedure details comment:  Ointment and small bandage applied  Specimen 1 - Surgical pathology Differential Diagnosis: lentigo vs SK, r/o atypia  Check Margins: Yes  Hemangiomas - Red papules - Discussed benign nature - Observe - Call for any changes  Actinic Damage - chronic, secondary to cumulative UV radiation exposure/sun exposure over time - diffuse scaly erythematous macules with underlying dyspigmentation - Recommend daily broad spectrum sunscreen SPF 30+ to sun-exposed areas, reapply every 2 hours as needed.  - Recommend staying in the shade or wearing long sleeves, sun glasses (UVA+UVB protection) and wide brim hats (4-inch brim around the entire circumference of the hat). - Call for new or changing lesions.  Return pending bx.  I, Harriett Sine, CMA, am acting as scribe for Brendolyn Patty, MD.  Documentation: I have reviewed the above documentation for accuracy and completeness, and I agree with the above.  Brendolyn Patty MD

## 2021-04-20 ENCOUNTER — Telehealth: Payer: Self-pay

## 2021-04-20 NOTE — Telephone Encounter (Signed)
-----   Message from Brendolyn Patty, MD sent at 04/20/2021  8:25 AM EDT ----- Skin , right lower elbow ATYPICAL JUNCTIONAL LENTIGINOUS MELANOCYTIC PROLIFERATION, PERIPHERAL MARGIN INVOLVED  Atypical pigmented lesion, needs excision - please call patient

## 2021-04-20 NOTE — Telephone Encounter (Signed)
Advised patient and husband of biopsy results and surgery scheduled for 05/17/2021 at 1:30pm.

## 2021-05-27 ENCOUNTER — Other Ambulatory Visit: Payer: Self-pay | Admitting: Family Medicine

## 2021-05-27 NOTE — Telephone Encounter (Signed)
Requested Prescriptions  Pending Prescriptions Disp Refills  . loratadine (CLARITIN) 10 MG tablet [Pharmacy Med Name: Loratadine 10mg  Tablet] 90 tablet 0    Sig: Take 1 tablet by mouth daily.     Ear, Nose, and Throat:  Antihistamines Passed - 05/27/2021  6:01 AM      Passed - Valid encounter within last 12 months    Recent Outpatient Visits          2 months ago Gastroesophageal reflux disease, unspecified whether esophagitis present   [facility], Megan P, DO   5 months ago Gastroesophageal reflux disease, unspecified whether esophagitis present   [facility], Megan P, DO   8 months ago Benign hypertensive renal disease   [facility] El Cenizo, Ludell, DO   11 months ago Benign hypertensive renal disease   [facility] Kermit, Christopher, DO   1 year ago Benign hypertensive renal disease   [facility] Johnson, Barb Merino, DO      Future Appointments            In 2 weeks  [facility] [place]   In 1 month Caruthers, Megan P, DO [facility]

## 2021-06-16 ENCOUNTER — Ambulatory Visit: Payer: Medicare Other

## 2021-06-17 DIAGNOSIS — G479 Sleep disorder, unspecified: Secondary | ICD-10-CM | POA: Diagnosis not present

## 2021-06-17 DIAGNOSIS — G20 Parkinson's disease: Secondary | ICD-10-CM | POA: Diagnosis not present

## 2021-06-17 DIAGNOSIS — R413 Other amnesia: Secondary | ICD-10-CM | POA: Diagnosis not present

## 2021-06-17 DIAGNOSIS — R251 Tremor, unspecified: Secondary | ICD-10-CM | POA: Diagnosis not present

## 2021-06-18 ENCOUNTER — Ambulatory Visit (INDEPENDENT_AMBULATORY_CARE_PROVIDER_SITE_OTHER): Payer: Medicare Other

## 2021-06-18 VITALS — Ht 62.0 in | Wt 129.0 lb

## 2021-06-18 DIAGNOSIS — Z0000 Encounter for general adult medical examination without abnormal findings: Secondary | ICD-10-CM | POA: Diagnosis not present

## 2021-06-18 NOTE — Progress Notes (Signed)
I connected with Cynthia Vaughan today by telephone and verified that I am speaking with the correct person using two identifiers. Location patient: home Location provider: work Persons participating in the virtual visit: Shawne Bulow, [place] spouse, Glenna Durand LPN.   I discussed the limitations, risks, security and privacy concerns of performing an evaluation and management service by telephone and the availability of in person appointments. I also discussed with the patient that there may be a patient responsible charge related to this service. The patient expressed understanding and verbally consented to this telephonic visit.    Interactive audio and video telecommunications were attempted between this provider and patient, however failed, due to patient having technical difficulties OR patient did not have access to video capability.  We continued and completed visit with audio only.     Vital signs may be patient reported or missing.  Subjective:   Cynthia Vaughan is a 80 y.o. female who presents for Medicare Annual (Subsequent) preventive examination.  Review of Systems     Cardiac Risk Factors include: advanced age (>19men, >36 women);dyslipidemia;sedentary lifestyle     Objective:    Today's Vitals   06/18/21 1111  Weight: 129 lb (58.5 kg)  Height: 5\' 2"  (1.575 m)   Body mass index is 23.59 kg/m.  Advanced Directives 06/18/2021 06/15/2020 05/12/2020 06/13/2019 12/18/2017 04/20/2017 02/18/2016  Does Patient Have a Medical Advance Directive? No No No No No No No  Does patient want to make changes to medical advance directive? - - - Yes (MAU/Ambulatory/Procedural Areas - Information given) - - -  Would patient like information on creating a medical advance directive? - - No - Patient declined - No - Patient declined No - Patient declined -  Some encounter information is confidential and restricted. Go to Review Flowsheets activity to see all data.    Current Medications  (verified) Outpatient Encounter Medications as of 06/18/2021  Medication Sig   acetaminophen (TYLENOL) 500 MG tablet Take 500 mg by mouth every 6 (six) hours as needed.   amLODipine (NORVASC) 2.5 MG tablet Take 1 tablet (2.5 mg total) by mouth daily.   aspirin EC 81 MG tablet Take 81 mg by mouth daily.   atorvastatin (LIPITOR) 40 MG tablet Take 1 tablet (40 mg total) by mouth at bedtime.   Calcium Carb-Cholecalciferol (CALCIUM 600 + D PO) Take by mouth daily.   carbidopa-levodopa (SINEMET IR) 25-100 MG tablet Take 1 tablet by mouth 3 (three) times daily.   donepezil (ARICEPT) 10 MG tablet Take 10 mg by mouth at bedtime.   DULoxetine (CYMBALTA) 30 MG capsule Take 1 capsule (30 mg total) by mouth daily.   fluticasone (FLONASE) 50 MCG/ACT nasal spray Place 2 sprays into both nostrils daily.   gabapentin (NEURONTIN) 100 MG capsule Take 1 capsule (100 mg total) by mouth 3 (three) times daily.   loratadine (CLARITIN) 10 MG tablet Take 1 tablet by mouth daily.   losartan (COZAAR) 50 MG tablet Take 1 tablet (50 mg total) by mouth daily.   memantine (NAMENDA) 5 MG tablet Take 5 mg by mouth daily.   omeprazole (PRILOSEC) 20 MG capsule Take 1 capsule (20 mg total) by mouth daily.   traZODone (DESYREL) 50 MG tablet Take 25 mg by mouth at bedtime as needed. (Patient not taking: Reported on 06/18/2021)   No facility-administered encounter medications on file as of 06/18/2021.    Allergies (verified) Codeine and Lisinopril   History: Past Medical History:  Diagnosis Date   Allergy  Anxiety    Atypical mole 04/14/2021   ATYPICAL JUNCTIONAL LENTIGINOUS MELANOCYTIC PROLIFERATION, R lower elbow, surgery scheduled 05/17/2021 at 1:30pm   Cancer [facility])    SKIN   CKD (chronic kidney disease) stage 2, GFR 60-89 ml/min    Depression    Edema    LEGS/FEET   GERD (gastroesophageal reflux disease)    Heart murmur    Hx of basal cell carcinoma 10/20/2009   R lateral infraorbital   Hx of basal cell  carcinoma 06/30/2015   R infra orbital scar   Hyperlipidemia    Hypertension    Hypopotassemia    Microalbuminuria    Osteoarthritis    feet, legs   Osteopenia    [facility] spotted fever) 05/10/2016   Urinary incontinence    Vitamin D deficiency disease    Wears dentures    partial lower   Wheezing    Past Surgical History:  Procedure Laterality Date   ABDOMINAL HYSTERECTOMY  2004   Total (due to bladder surgery)   BLADDER SURGERY     x 2   BUNIONECTOMY     CATARACT EXTRACTION W/PHACO Right 11/23/2015   Procedure: CATARACT EXTRACTION PHACO AND INTRAOCULAR LENS PLACEMENT ([place]);  Surgeon: Estill Cotta, MD;  Location: [facility] ORS;  Service: Ophthalmology;  Laterality: Right;  [place]    1:30.9 AP%  26.9 CDE   42.29 flud casette lot # [id number]  exp05/31/2018   CATARACT EXTRACTION W/PHACO Left 05/12/2020   Procedure: CATARACT EXTRACTION PHACO AND INTRAOCULAR LENS PLACEMENT ([facility]) LEFT 15.24 01:28.8;  Surgeon: Birder Robson, MD;  Location: [place];  Service: Ophthalmology;  Laterality: Left;   COLONOSCOPY     Family History  Problem Relation Age of Onset   AAA (abdominal aortic aneurysm) Mother    Arthritis Brother    Heart disease Brother    Stroke Daughter    Diabetes Son    Stroke Son    Seizures Son    Diabetes Brother    Heart disease Brother        7 total heart attacks   Hyperlipidemia Brother    Hypertension Brother    Dementia Brother    AAA (abdominal aortic aneurysm) Brother    Stroke Son    Heart disease Brother    Dementia Sister    Social History   Socioeconomic History   Marital status: Married    Spouse name: hal   Number of children: 3   Years of education: Not on file   Highest education level: GED or equivalent  Occupational History   Occupation: retired   Tobacco Use   Smoking status: Former    Types: Cigarettes    Quit date: 1990    Years since quitting: 32.8   Smokeless tobacco: Never   Tobacco comments:        Scientific laboratory technician Use: Never used  Substance and Sexual Activity   Alcohol use: Not Currently    Comment: OCCAS   Drug use: No   Sexual activity: Not Currently  Other Topics Concern   Not on file  Social History Narrative   Not on file   Social Determinants of Health   Financial Resource Strain: Low Risk    Difficulty of Paying Living Expenses: Not hard at all  Food Insecurity: No Food Insecurity   Worried About Charity fundraiser in the Last Year: Never true   [place] in the Last Year: Never true  Transportation Needs: No  Transportation Needs   Lack of Transportation (Medical): No   Lack of Transportation (Non-Medical): No  Physical Activity: Inactive   Days of Exercise per Week: 0 days   Minutes of Exercise per Session: 0 min  Stress: No Stress Concern Present   Feeling of Stress : Not at all  Social Connections: Not on file    Tobacco Counseling Counseling given: Not Answered Tobacco comments:     Clinical Intake:  Pre-visit preparation completed: Yes  Pain : No/denies pain     Nutritional Status: BMI of 19-24  Normal Nutritional Risks: None Diabetes: No  How often do you need to have someone help you when you read instructions, pamphlets, or other written materials from your doctor or pharmacy?: 1 - Never What is the last grade level you completed in school?: GED  Diabetic? no  Interpreter Needed?: No  Information entered by :: NAllen LPN   Activities of Daily Living In your present state of health, do you have any difficulty performing the following activities: 06/18/2021  Hearing? N  Vision? N  Difficulty concentrating or making decisions? Y  Walking or climbing stairs? N  Dressing or bathing? N  Doing errands, shopping? Y  Preparing Food and eating ? N  Using the Toilet? N  In the past six months, have you accidently leaked urine? N  Do you have problems with loss of bowel control? N  Managing your Medications? N  Comment has  packets  Managing your Finances? N  Housekeeping or managing your Housekeeping? N  Some recent data might be hidden    Patient Care Team: [name], DO as PCP - General (Family Medicine)  Indicate any recent Medical Services you may have received from other than [facility] providers in the past year (date may be approximate).     Assessment:   This is a routine wellness examination for Breanda.  Hearing/Vision screen Vision Screening - Comments:: Regular eye exams, [facility]  Dietary issues and exercise activities discussed: Current Exercise Habits: The patient does not participate in regular exercise at present   Goals Addressed             This Visit's Progress    Patient Stated       06/18/2021, no goals       Depression Screen PHQ 2/9 Scores 06/18/2021 03/26/2021 03/26/2021 12/24/2020 09/15/2020 06/15/2020 06/15/2020  PHQ - 2 Score 0 0 0 0 0 0 0  PHQ- 9 Score - 4 - 2 1 0 -    Fall Risk Fall Risk  06/18/2021 03/26/2021 06/15/2020 06/15/2020 06/13/2019  Falls in the past year? 0 0 0 0 0  Number falls in past yr: - 0 - - 0  Injury with Fall? - 0 - - 0  Risk for fall due to : Medication side effect No Fall Risks - Medication side effect -  Follow up Falls evaluation completed;Education provided;Falls prevention discussed Falls evaluation completed Falls evaluation completed Education provided;Falls evaluation completed;Falls prevention discussed -    FALL RISK PREVENTION PERTAINING TO THE HOME:  Any stairs in or around the home? Yes  If so, are there any without handrails? No  Home free of loose throw rugs in walkways, pet beds, electrical cords, etc? Yes  Adequate lighting in your home to reduce risk of falls? Yes   ASSISTIVE DEVICES UTILIZED TO PREVENT FALLS:  Life alert? No  Use of a cane, walker or w/c? No  Grab bars in the bathroom? Yes  Shower  chair or bench in shower? Yes  Elevated toilet seat or a handicapped toilet? No   TIMED UP AND GO:  Was  the test performed? No .      Cognitive Function: MMSE - Mini Mental State Exam 12/04/2018 12/18/2017  Orientation to time 3 3  Orientation to Place 5 4  Registration 3 3  Attention/ Calculation 2 5  Recall 0 0  Language- name 2 objects 2 2  Language- repeat 1 1  Language- follow 3 step command 3 3  Language- read & follow direction 1 1  Write a sentence 1 1  Copy design 1 1  Total score 22 24     6CIT Screen 06/18/2021 06/15/2020 06/13/2019 04/20/2017  What Year? 0 points 0 points 0 points 0 points  What month? 3 points 0 points 0 points 0 points  What time? 0 points 0 points 0 points 0 points  Count back from 20 0 points 0 points 0 points 0 points  Months in reverse 4 points 4 points 0 points 0 points  Repeat phrase 10 points 6 points 4 points 0 points  Total Score 17 10 4  0    Immunizations Immunization History  Administered Date(s) Administered   Fluad Quad(high Dose 65+) 05/16/2019, 06/15/2020   Influenza, High Dose Seasonal PF 06/21/2017, 06/01/2018   Influenza,inj,Quad PF,6+ Mos 05/21/2015   Influenza,inj,quad, With Preservative 06/22/2016   Influenza-Unspecified 06/11/2014, 05/21/2015, 07/04/2016   PFIZER(Purple Top)SARS-COV-2 Vaccination 10/14/2019, 11/04/2019, 08/25/2020   Pneumococcal Conjugate-13 07/07/2015   Pneumococcal Polysaccharide-23 02/15/2006, 11/04/2010   Td 02/15/2006, 06/01/2018   Zoster, Live 02/05/2008    TDAP status: Up to date  Flu Vaccine status: Due, Education has been provided regarding the importance of this vaccine. Advised may receive this vaccine at local pharmacy or Health Dept. Aware to provide a copy of the vaccination record if obtained from local pharmacy or Health Dept. Verbalized acceptance and understanding.  Pneumococcal vaccine status: Up to date  Covid-19 vaccine status: Completed vaccines  Qualifies for Shingles Vaccine? Yes   Zostavax completed Yes   Shingrix Completed?: No.    Education has been provided regarding  the importance of this vaccine. Patient has been advised to call insurance company to determine out of pocket expense if they have not yet received this vaccine. Advised may also receive vaccine at local pharmacy or Health Dept. Verbalized acceptance and understanding.  Screening Tests Health Maintenance  Topic Date Due   COVID-19 Vaccine (4 - Booster for Pfizer series) 10/20/2020   INFLUENZA VACCINE  03/22/2021   Zoster Vaccines- Shingrix (1 of 2) 06/26/2021 (Originally 01/22/1960)   TETANUS/TDAP  06/01/2028   Pneumonia Vaccine 81+ Years old  Completed   Hepatitis C Screening  Completed   DEXA SCAN  Addressed   HPV VACCINES  Aged Out    Health Maintenance  Health Maintenance Due  Topic Date Due   COVID-19 Vaccine (4 - Booster for Pfizer series) 10/20/2020   INFLUENZA VACCINE  03/22/2021    Colorectal cancer screening: No longer required.   Mammogram status: No longer required due to age.  Bone Density status: Completed 04/24/2014.  Lung Cancer Screening: (Low Dose CT Chest recommended if Age 58-80 years, 30 pack-year currently smoking OR have quit w/in 15years.) does not qualify.   Lung Cancer Screening Referral: no  Additional Screening:  Hepatitis C Screening: does qualify; Completed 06/15/2020  Vision Screening: Recommended annual ophthalmology exams for early detection of glaucoma and other disorders of the eye. Is the patient up to  date with their annual eye exam?  Yes  Who is the provider or what is the name of the office in which the patient attends annual eye exams? [facility] If pt is not established with a provider, would they like to be referred to a provider to establish care? No .   Dental Screening: Recommended annual dental exams for proper oral hygiene  Community Resource Referral / Chronic Care Management: CRR required this visit?  No   CCM required this visit?  No      Plan:     I have personally reviewed and noted the following in the  patient's chart:   Medical and social history Use of alcohol, tobacco or illicit drugs  Current medications and supplements including opioid prescriptions.  Functional ability and status Nutritional status Physical activity Advanced directives List of other physicians Hospitalizations, surgeries, and ER visits in previous 12 months Vitals Screenings to include cognitive, depression, and falls Referrals and appointments  In addition, I have reviewed and discussed with patient certain preventive protocols, quality metrics, and best practice recommendations. A written personalized care plan for preventive services as well as general preventive health recommendations were provided to patient.     Kellie Simmering, LPN   [id number]   Nurse Notes:

## 2021-06-18 NOTE — Patient Instructions (Signed)
[name] , Thank you for taking time to come for your Medicare Wellness Visit. I appreciate your ongoing commitment to your health goals. Please review the following plan we discussed and let me know if I can assist you in the future.   Screening recommendations/referrals: Colonoscopy: not required Mammogram: not required Bone Density: completed 04/24/2014 Recommended yearly ophthalmology/optometry visit for glaucoma screening and checkup Recommended yearly dental visit for hygiene and checkup  Vaccinations: Influenza vaccine: due Pneumococcal vaccine: completed 07/07/2015 Tdap vaccine: completed 06/01/2018, due 06/01/2028 Shingles vaccine: discussed   Covid-19: 08/25/2020, 11/04/2019, 10/14/2019  Advanced directives: Advance directive discussed with you today. .  Conditions/risks identified: none  Next appointment: Follow up in one year for your annual wellness visit    Preventive Care 65 Years and Older, Female Preventive care refers to lifestyle choices and visits with your health care provider that can promote health and wellness. What does preventive care include? A yearly physical exam. This is also called an annual well check. Dental exams once or twice a year. Routine eye exams. Ask your health care provider how often you should have your eyes checked. Personal lifestyle choices, including: Daily care of your teeth and gums. Regular physical activity. Eating a healthy diet. Avoiding tobacco and drug use. Limiting alcohol use. Practicing safe sex. Taking low-dose aspirin every day. Taking vitamin and mineral supplements as recommended by your health care provider. What happens during an annual well check? The services and screenings done by your health care provider during your annual well check will depend on your age, overall health, lifestyle risk factors, and family history of disease. Counseling  Your health care provider may ask you questions about your: Alcohol  use. Tobacco use. Drug use. Emotional well-being. Home and relationship well-being. Sexual activity. Eating habits. History of falls. Memory and ability to understand (cognition). Work and work Statistician. Reproductive health. Screening  You may have the following tests or measurements: Height, weight, and BMI. Blood pressure. Lipid and cholesterol levels. These may be checked every 5 years, or more frequently if you are over 27 years old. Skin check. Lung cancer screening. You may have this screening every year starting at age 49 if you have a 30-pack-year history of smoking and currently smoke or have quit within the past 15 years. Fecal occult blood test (FOBT) of the stool. You may have this test every year starting at age 24. Flexible sigmoidoscopy or colonoscopy. You may have a sigmoidoscopy every 5 years or a colonoscopy every 10 years starting at age 29. Hepatitis C blood test. Hepatitis B blood test. Sexually transmitted disease (STD) testing. Diabetes screening. This is done by checking your blood sugar (glucose) after you have not eaten for a while (fasting). You may have this done every 1-3 years. Bone density scan. This is done to screen for osteoporosis. You may have this done starting at age 54. Mammogram. This may be done every 1-2 years. Talk to your health care provider about how often you should have regular mammograms. Talk with your health care provider about your test results, treatment options, and if necessary, the need for more tests. Vaccines  Your health care provider may recommend certain vaccines, such as: Influenza vaccine. This is recommended every year. Tetanus, diphtheria, and acellular pertussis (Tdap, Td) vaccine. You may need a Td booster every 10 years. Zoster vaccine. You may need this after age 10. Pneumococcal 13-valent conjugate (PCV13) vaccine. One dose is recommended after age 33. Pneumococcal polysaccharide (PPSV23) vaccine. One dose is  recommended after age 63. Talk to your health care provider about which screenings and vaccines you need and how often you need them. This information is not intended to replace advice given to you by your health care provider. Make sure you discuss any questions you have with your health care provider. Document Released: 09/04/2015 Document Revised: 04/27/2016 Document Reviewed: 06/09/2015 Elsevier Interactive Patient Education  2017 [place] Prevention in the Home Falls can cause injuries. They can happen to people of all ages. There are many things you can do to make your home safe and to help prevent falls. What can I do on the outside of my home? Regularly fix the edges of walkways and driveways and fix any cracks. Remove anything that might make you trip as you walk through a door, such as a raised step or threshold. Trim any bushes or trees on the path to your home. Use bright outdoor lighting. Clear any walking paths of anything that might make someone trip, such as rocks or tools. Regularly check to see if handrails are loose or broken. Make sure that both sides of any steps have handrails. Any raised decks and porches should have guardrails on the edges. Have any leaves, snow, or ice cleared regularly. Use sand or salt on walking paths during winter. Clean up any spills in your garage right away. This includes oil or grease spills. What can I do in the bathroom? Use night lights. Install grab bars by the toilet and in the tub and shower. Do not use towel bars as grab bars. Use non-skid mats or decals in the tub or shower. If you need to sit down in the shower, use a plastic, non-slip stool. Keep the floor dry. Clean up any water that spills on the floor as soon as it happens. Remove soap buildup in the tub or shower regularly. Attach bath mats securely with double-sided non-slip rug tape. Do not have throw rugs and other things on the floor that can make you  trip. What can I do in the bedroom? Use night lights. Make sure that you have a light by your bed that is easy to reach. Do not use any sheets or blankets that are too big for your bed. They should not hang down onto the floor. Have a firm chair that has side arms. You can use this for support while you get dressed. Do not have throw rugs and other things on the floor that can make you trip. What can I do in the kitchen? Clean up any spills right away. Avoid walking on wet floors. Keep items that you use a lot in easy-to-reach places. If you need to reach something above you, use a strong step stool that has a grab bar. Keep electrical cords out of the way. Do not use floor polish or wax that makes floors slippery. If you must use wax, use non-skid floor wax. Do not have throw rugs and other things on the floor that can make you trip. What can I do with my stairs? Do not leave any items on the stairs. Make sure that there are handrails on both sides of the stairs and use them. Fix handrails that are broken or loose. Make sure that handrails are as long as the stairways. Check any carpeting to make sure that it is firmly attached to the stairs. Fix any carpet that is loose or worn. Avoid having throw rugs at the top or bottom of the stairs. If you  do have throw rugs, attach them to the floor with carpet tape. Make sure that you have a light switch at the top of the stairs and the bottom of the stairs. If you do not have them, ask someone to add them for you. What else can I do to help prevent falls? Wear shoes that: Do not have high heels. Have rubber bottoms. Are comfortable and fit you well. Are closed at the toe. Do not wear sandals. If you use a stepladder: Make sure that it is fully opened. Do not climb a closed stepladder. Make sure that both sides of the stepladder are locked into place. Ask someone to hold it for you, if possible. Clearly mark and make sure that you can  see: Any grab bars or handrails. First and last steps. Where the edge of each step is. Use tools that help you move around (mobility aids) if they are needed. These include: Canes. Walkers. Scooters. Crutches. Turn on the lights when you go into a dark area. Replace any light bulbs as soon as they burn out. Set up your furniture so you have a clear path. Avoid moving your furniture around. If any of your floors are uneven, fix them. If there are any pets around you, be aware of where they are. Review your medicines with your doctor. Some medicines can make you feel dizzy. This can increase your chance of falling. Ask your doctor what other things that you can do to help prevent falls. This information is not intended to replace advice given to you by your health care provider. Make sure you discuss any questions you have with your health care provider. Document Released: 06/04/2009 Document Revised: 01/14/2016 Document Reviewed: 09/12/2014 Elsevier Interactive Patient Education  2017 [employer].

## 2021-06-29 ENCOUNTER — Ambulatory Visit: Payer: Medicare Other | Admitting: Family Medicine

## 2021-07-05 ENCOUNTER — Other Ambulatory Visit: Payer: Self-pay

## 2021-07-05 ENCOUNTER — Encounter: Payer: Self-pay | Admitting: Dermatology

## 2021-07-05 ENCOUNTER — Ambulatory Visit (INDEPENDENT_AMBULATORY_CARE_PROVIDER_SITE_OTHER): Payer: Medicare Other | Admitting: Dermatology

## 2021-07-05 DIAGNOSIS — D485 Neoplasm of uncertain behavior of skin: Secondary | ICD-10-CM

## 2021-07-05 DIAGNOSIS — L905 Scar conditions and fibrosis of skin: Secondary | ICD-10-CM | POA: Diagnosis not present

## 2021-07-05 NOTE — Progress Notes (Signed)
   Follow-Up Visit   Subjective  Cynthia Vaughan is a 80 y.o. 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 ).   The following portions of the chart were reviewed this encounter and updated as appropriate:       Review of Systems:  No other skin or systemic complaints except as noted in HPI or Assessment and Plan.  Objective  Well appearing patient in no apparent distress; mood and affect are within normal limits.  A focused examination was performed including right lower elbow. Relevant physical exam findings are noted in the Assessment and Plan.  R lower elbow Pink biopsy site, 10.0 x 9.61mm   Assessment & Plan  Neoplasm of uncertain behavior of skin R lower elbow  Skin excision  Lesion length (cm):  1 Lesion width (cm):  0.9 Margin per side (cm):  0.2 Total excision diameter (cm):  1.4 Informed consent: discussed and consent obtained   Timeout: patient name, date of birth, surgical site, and procedure verified   Procedure prep:  Patient was prepped and draped in usual sterile fashion Prep type:  Povidone-iodine Anesthesia: the lesion was anesthetized in a standard fashion   Anesthesia comment:  Total 18cc - 6cc lido w/epi, 12cc bupivicaine Anesthetic:  1% lidocaine w/ epinephrine 1-100,000 buffered w/ 8.4% NaHCO3 (0.5% bupivicaine) Instrument used: #15 blade   Hemostasis achieved with: pressure and electrodesiccation   Outcome: patient tolerated procedure well with no complications   Additional details:  Tag medial tip  Skin repair Complexity:  Intermediate Final length (cm):  3.8 Informed consent: discussed and consent obtained   Timeout: patient name, date of birth, surgical site, and procedure verified   Reason for type of repair: reduce tension to allow closure, reduce the risk of dehiscence, infection, and necrosis, reduce  subcutaneous dead space and avoid a hematoma, preserve normal anatomical and functional relationships and enhance both functionality and cosmetic results   Undermining: edges could be approximated without difficulty and edges undermined   Subcutaneous layers (deep stitches):  Suture size:  4-0 Suture type: Vicryl (polyglactin 910)   Stitches:  Buried vertical mattress Fine/surface layer approximation (top stitches):  Suture size:  4-0 Suture type: nylon   Stitches: horizontal mattress and simple interrupted   Suture removal (days):  7 Hemostasis achieved with: suture Outcome: patient tolerated procedure well with no complications   Post-procedure details: sterile dressing applied and wound care instructions given   Post-procedure details comment:  Ointment and pressure bandage applied Dressing type: pressure dressing (mupirocin)    Specimen 1 - Surgical pathology Differential Diagnosis: Atypical Junctional Lentiginous Proliferation, bx proven Check Margins: Yes Pink biopsy site. [phone]  Return in about 1 week (around 07/12/2021) for suture removal.  I, Jamesetta Orleans, CMA, am acting as scribe for Brendolyn Patty, MD .  Documentation: I have reviewed the above documentation for accuracy and completeness, and I agree with the above.  Brendolyn Patty MD

## 2021-07-05 NOTE — Patient Instructions (Signed)
Wound Care Instructions  Cleanse wound gently with soap and water once a day then pat dry with clean gauze. Apply a thing coat of Petrolatum (petroleum jelly, "Vaseline") over the wound (unless you have an allergy to this). We recommend that you use a new, sterile tube of Vaseline. Do not pick or remove scabs. Do not remove the yellow or white "healing tissue" from the base of the wound.  Cover the wound with fresh, clean, nonstick gauze and secure with paper tape. You may use Band-Aids in place of gauze and tape if the would is small enough, but would recommend trimming much of the tape off as there is often too much. Sometimes Band-Aids can irritate the skin.  You should call the office for your biopsy report after 1 week if you have not already been contacted.  If you experience any problems, such as abnormal amounts of bleeding, swelling, significant bruising, significant pain, or evidence of infection, please call the office immediately.  FOR ADULT SURGERY PATIENTS: If you need something for pain relief you may take 1 extra strength Tylenol (acetaminophen) AND 2 Ibuprofen (200mg each) together every 4 hours as needed for pain. (do not take these if you are allergic to them or if you have a reason you should not take them.) Typically, you may only need pain medication for 1 to 3 days.   If you have any questions or concerns for your doctor, please call our main line at [phone] and press option 4 to reach your doctor's medical assistant. If no one answers, please leave a voicemail as directed and we will return your call as soon as possible. Messages left after 4 pm will be answered the following business day.   You may also send us a message via MyChart. We typically respond to MyChart messages within 1-2 business days.  For prescription refills, please ask your pharmacy to contact our office. Our fax number is [phone].  If you have an urgent issue when the clinic is closed that  cannot wait until the next business day, you can page your doctor at the number below.    Please note that while we do our best to be available for urgent issues outside of office hours, we are not available 24/7.   If you have an urgent issue and are unable to reach us, you may choose to seek medical care at your doctor's office, retail clinic, urgent care center, or emergency room.  If you have a medical emergency, please immediately call 911 or go to the emergency department.  Pager Numbers  - Dr. Kowalski: [phone]  - Dr. Moye: [phone]  - Dr. Stewart: [phone]  In the event of inclement weather, please call our main line at [phone] for an update on the status of any delays or closures.  Dermatology Medication Tips: Please keep the boxes that topical medications come in in order to help keep track of the instructions about where and how to use these. Pharmacies typically print the medication instructions only on the boxes and not directly on the medication tubes.   If your medication is too expensive, please contact our office at [phone] option 4 or send us a message through MyChart.   We are unable to tell what your co-pay for medications will be in advance as this is different depending on your insurance coverage. However, we may be able to find a substitute medication at lower cost or fill out paperwork to get insurance to cover a needed   medication.   If a prior authorization is required to get your medication covered by your insurance company, please allow us 1-2 business days to complete this process.  Drug prices often vary depending on where the prescription is filled and some pharmacies may offer cheaper prices.  The website www.goodrx.com contains coupons for medications through different pharmacies. The prices here do not account for what the cost may be with help from insurance (it may be cheaper with your insurance), but the website can give you the  price if you did not use any insurance.  - You can print the associated coupon and take it with your prescription to the pharmacy.  - You may also stop by our office during regular business hours and pick up a GoodRx coupon card.  - If you need your prescription sent electronically to a different pharmacy, notify our office through [facility] MyChart or by phone at [phone] option 4.   

## 2021-07-06 ENCOUNTER — Telehealth: Payer: Self-pay

## 2021-07-06 NOTE — Telephone Encounter (Signed)
Left message for patient to call with any problems or questions from surgery yesterday.  

## 2021-07-12 ENCOUNTER — Other Ambulatory Visit: Payer: Self-pay

## 2021-07-12 ENCOUNTER — Telehealth: Payer: Self-pay

## 2021-07-12 ENCOUNTER — Ambulatory Visit (INDEPENDENT_AMBULATORY_CARE_PROVIDER_SITE_OTHER): Payer: Medicare Other | Admitting: Dermatology

## 2021-07-12 DIAGNOSIS — D229 Melanocytic nevi, unspecified: Secondary | ICD-10-CM

## 2021-07-12 DIAGNOSIS — D2261 Melanocytic nevi of right upper limb, including shoulder: Secondary | ICD-10-CM

## 2021-07-12 NOTE — Progress Notes (Signed)
   Follow-Up Visit   Subjective  Cynthia Vaughan is a 80 y.o. female who presents for the following: RESIDUAL ATYPICAL MELANOCYTIC PROLIFERATION, MARGINS FREE (R lower elbow).   The following portions of the chart were reviewed this encounter and updated as appropriate:       Review of Systems:  No other skin or systemic complaints except as noted in HPI or Assessment and Plan.  Objective  Well appearing patient in no apparent distress; mood and affect are within normal limits.  A focused examination was performed including right arm. Relevant physical exam findings are noted in the Assessment and Plan.  Right Forearm - Posterior Healing excision site, no evidence of infection   Assessment & Plan  Atypical nevus Right Forearm - Posterior  RESIDUAL ATYPICAL MELANOCYTIC PROLIFERATION, MARGINS FREE  Encounter for Removal of Sutures - Incision site at the right lower elbow is clean, dry and intact - Wound cleansed, sutures removed, wound cleansed and steri strips applied.  - Discussed pathology results showing RESIDUAL ATYPICAL MELANOCYTIC PROLIFERATION, MARGINS FREE  - Patient advised to keep steri-strips dry until they fall off. - Scars remodel for a full year. - Once steri-strips fall off, patient can apply over-the-counter silicone scar cream each night to help with scar remodeling if desired. - Patient advised to call with any concerns or if they notice any new or changing lesions.   Return in about 6 months (around 01/09/2022) for UBSE.  I, Othelia Pulling, RMA, am acting as scribe for Brendolyn Patty, MD .  Documentation: I have reviewed the above documentation for accuracy and completeness, and I agree with the above.  Brendolyn Patty MD

## 2021-07-12 NOTE — Telephone Encounter (Signed)
Left pt msg to see if she could come today at 2:30 for suture removal instead of 2:15./sh

## 2021-07-12 NOTE — Patient Instructions (Signed)

## 2021-07-24 ENCOUNTER — Other Ambulatory Visit: Payer: Self-pay | Admitting: Family Medicine

## 2021-07-24 DIAGNOSIS — F331 Major depressive disorder, recurrent, moderate: Secondary | ICD-10-CM

## 2021-07-24 NOTE — Telephone Encounter (Signed)
Requested Prescriptions  Pending Prescriptions Disp Refills  . DULoxetine (CYMBALTA) 30 MG capsule [Pharmacy Med Name: Duloxetine 30mg  Delayed-Release Capsule] 90 capsule 1    Sig: Take 1 capsule by mouth daily.     Psychiatry: Antidepressants - SNRI Passed - 07/24/2021  5:57 AM      Passed - Completed PHQ-2 or PHQ-9 in the last 360 days      Passed - Last BP in normal range    BP Readings from Last 1 Encounters:  03/26/21 115/75         Passed - Valid encounter within last 6 months    Recent Outpatient Visits          4 months ago Gastroesophageal reflux disease, unspecified whether esophagitis present   [facility], Megan P, DO   7 months ago Gastroesophageal reflux disease, unspecified whether esophagitis present   [facility], Megan P, DO   10 months ago Benign hypertensive renal disease   [facility] [place], [place], DO   1 year ago Benign hypertensive renal disease   [facility] [place], [place], DO   1 year ago Benign hypertensive renal disease   [place], [place], DO      Future Appointments            In 5 months Brendolyn Patty, MD [place]   In 11 months  [facility], [place]

## 2021-08-21 ENCOUNTER — Other Ambulatory Visit: Payer: Self-pay | Admitting: Family Medicine

## 2021-08-21 NOTE — Telephone Encounter (Signed)
Requested medication (s) are due for refill today: filled 90 day supply 07/24/21, it is mail order  Requested medication (s) are on the active medication list: yes  Last refill:  07/24/21  Future visit scheduled: no  Notes to clinic:  requesting early but is mail order. Please assess.  Requested Prescriptions  Pending Prescriptions Disp Refills   loratadine (CLARITIN) 10 MG tablet [Pharmacy Med Name: Loratadine 10mg  Tablet] 90 tablet 0    Sig: Take 1 tablet by mouth daily.     Ear, Nose, and Throat:  Antihistamines Passed - 08/21/2021  6:19 AM      Passed - Valid encounter within last 12 months    Recent Outpatient Visits           4 months ago Gastroesophageal reflux disease, unspecified whether esophagitis present   [facility], Megan P, DO   8 months ago Gastroesophageal reflux disease, unspecified whether esophagitis present   [facility], Megan P, DO   11 months ago Benign hypertensive renal disease   [facility] [place], [place], DO   1 year ago Benign hypertensive renal disease   [facility] [place], [place], DO   1 year ago Benign hypertensive renal disease   [place], [place], DO       Future Appointments             In 5 months Brendolyn Patty, MD [place]   In 10 months  [place]             gabapentin (NEURONTIN) 100 MG capsule [Pharmacy Med Name: Gabapentin 100mg  Capsule] 270 capsule 0    Sig: Take 1 capsule by mouth three times daily.     Neurology: Anticonvulsants - gabapentin Passed - 08/21/2021  6:19 AM      Passed - Valid encounter within last 12 months    Recent Outpatient Visits           4 months ago Gastroesophageal reflux disease, unspecified whether esophagitis present   [facility], Megan P, DO   8 months ago Gastroesophageal reflux disease, unspecified whether esophagitis present   [facility], Megan P, DO   11 months ago Benign hypertensive renal disease   [facility] [place], [place], DO   1 year ago Benign hypertensive renal disease   [facility] [place], [place], DO   1 year ago Benign hypertensive renal disease   [place], [place], DO       Future Appointments             In 5 months Brendolyn Patty, MD [place]   In 10 months  [facility], [place]

## 2021-10-01 ENCOUNTER — Encounter: Payer: Self-pay | Admitting: Family Medicine

## 2021-10-01 ENCOUNTER — Ambulatory Visit (INDEPENDENT_AMBULATORY_CARE_PROVIDER_SITE_OTHER): Payer: Medicare Other | Admitting: Family Medicine

## 2021-10-01 ENCOUNTER — Other Ambulatory Visit: Payer: Self-pay

## 2021-10-01 VITALS — BP 116/78 | HR 76 | Temp 98.1°F | Wt 134.0 lb

## 2021-10-01 DIAGNOSIS — Z23 Encounter for immunization: Secondary | ICD-10-CM

## 2021-10-01 DIAGNOSIS — M79671 Pain in right foot: Secondary | ICD-10-CM | POA: Diagnosis not present

## 2021-10-01 DIAGNOSIS — M79672 Pain in left foot: Secondary | ICD-10-CM

## 2021-10-01 DIAGNOSIS — M4726 Other spondylosis with radiculopathy, lumbar region: Secondary | ICD-10-CM

## 2021-10-01 DIAGNOSIS — E559 Vitamin D deficiency, unspecified: Secondary | ICD-10-CM | POA: Diagnosis not present

## 2021-10-01 DIAGNOSIS — F331 Major depressive disorder, recurrent, moderate: Secondary | ICD-10-CM | POA: Diagnosis not present

## 2021-10-01 DIAGNOSIS — E782 Mixed hyperlipidemia: Secondary | ICD-10-CM

## 2021-10-01 DIAGNOSIS — N182 Chronic kidney disease, stage 2 (mild): Secondary | ICD-10-CM

## 2021-10-01 DIAGNOSIS — I129 Hypertensive chronic kidney disease with stage 1 through stage 4 chronic kidney disease, or unspecified chronic kidney disease: Secondary | ICD-10-CM | POA: Diagnosis not present

## 2021-10-01 DIAGNOSIS — G20 Parkinson's disease: Secondary | ICD-10-CM

## 2021-10-01 DIAGNOSIS — K219 Gastro-esophageal reflux disease without esophagitis: Secondary | ICD-10-CM

## 2021-10-01 DIAGNOSIS — G20A1 Parkinson's disease without dyskinesia, without mention of fluctuations: Secondary | ICD-10-CM

## 2021-10-01 DIAGNOSIS — F09 Unspecified mental disorder due to known physiological condition: Secondary | ICD-10-CM | POA: Diagnosis not present

## 2021-10-01 LAB — MICROSCOPIC EXAMINATION: WBC, UA: NONE SEEN /hpf (ref 0–5)

## 2021-10-01 LAB — MICROALBUMIN, URINE WAIVED
Creatinine, Urine Waived: 100 mg/dL (ref 10–300)
Microalb, Ur Waived: 30 mg/L — ABNORMAL HIGH (ref 0–19)
Microalb/Creat Ratio: <30 mg/g (ref <30)

## 2021-10-01 LAB — URINALYSIS, ROUTINE W REFLEX MICROSCOPIC
Bilirubin, UA: NEGATIVE
Glucose, UA: NEGATIVE
Ketones, UA: NEGATIVE
Leukocytes,UA: NEGATIVE
Nitrite, UA: NEGATIVE
Protein,UA: NEGATIVE
Specific Gravity, UA: 1.025 (ref 1.005–1.030)
Urobilinogen, Ur: 1 mg/dL (ref 0.2–1.0)
pH, UA: 7 (ref 5.0–7.5)

## 2021-10-01 MED ORDER — LORATADINE 10 MG PO TABS: 10.0000 mg | ORAL_TABLET | Freq: Every day | ORAL | 1 refills | Status: DC

## 2021-10-01 MED ORDER — ATORVASTATIN CALCIUM 40 MG PO TABS: 40.0000 mg | ORAL_TABLET | Freq: Every day | ORAL | 1 refills | Status: DC

## 2021-10-01 MED ORDER — OMEPRAZOLE 20 MG PO CPDR: 20.0000 mg | DELAYED_RELEASE_CAPSULE | Freq: Every day | ORAL | 1 refills | Status: DC

## 2021-10-01 MED ORDER — AMLODIPINE BESYLATE 2.5 MG PO TABS: 2.5000 mg | ORAL_TABLET | Freq: Every day | ORAL | 1 refills | Status: DC

## 2021-10-01 MED ORDER — LOSARTAN POTASSIUM 50 MG PO TABS
50.0000 mg | ORAL_TABLET | Freq: Every day | ORAL | 1 refills | Status: DC
Start: 2021-10-01 — End: 2022-04-01

## 2021-10-01 MED ORDER — GABAPENTIN 300 MG PO CAPS: 300.0000 mg | ORAL_CAPSULE | Freq: Three times a day (TID) | ORAL | 1 refills | Status: DC

## 2021-10-01 MED ORDER — DULOXETINE HCL 30 MG PO CPEP: 30.0000 mg | ORAL_CAPSULE | Freq: Every day | ORAL | 1 refills | Status: DC

## 2021-10-01 NOTE — Assessment & Plan Note (Signed)
Rechecking labs today. Await results. Treat as needed.  °

## 2021-10-01 NOTE — Assessment & Plan Note (Signed)
Under good control on current regimen. Continue current regimen. Continue to monitor. Call with any concerns. Refills given. Labs drawn today.   

## 2021-10-01 NOTE — Assessment & Plan Note (Signed)
Continue to follow with neurology. Call with any concerns.

## 2021-10-01 NOTE — Assessment & Plan Note (Signed)
Uncontrolled. No longer seeing ortho or neurosurgery. Will increase her gabapentin and recheck 3 months.

## 2021-10-01 NOTE — Assessment & Plan Note (Signed)
Stable. Continue to follow with neurology.

## 2021-10-01 NOTE — Progress Notes (Signed)
BP 116/78    Pulse 76    Temp 98.1 F (36.7 C)    Wt 134 lb (60.8 kg)    LMP  (LMP Unknown)    SpO2 96%    BMI 24.51 kg/m    Subjective:    Patient ID: Cynthia Vaughan, female    DOB: [birth year], 81 y.o.   MRN: [id number]  HPI: Cynthia Vaughan is a 81 y.o. female  No chief complaint on file.  HYPERTENSION / HYPERLIPIDEMIA Satisfied with current treatment? yes Duration of hypertension: chronic BP monitoring frequency: not checking BP medication side effects: no Past BP meds: losartan, amlodipine Duration of hyperlipidemia: chronic Cholesterol medication side effects: no Cholesterol supplements: none Past cholesterol medications: atorvastatin Medication compliance: good compliance Aspirin: yes Recent stressors: no Recurrent headaches: no Visual changes: no Palpitations: no Dyspnea: no Chest pain: no Lower extremity edema: no Dizzy/lightheaded: no  Relevant past medical, surgical, family and social history reviewed and updated as indicated. Interim medical history since our last visit reviewed. Allergies and medications reviewed and updated.  Review of Systems  Constitutional: Negative.   Respiratory: Negative.    Cardiovascular: Negative.   Gastrointestinal: Negative.   Musculoskeletal:  Positive for back pain and myalgias. Negative for arthralgias, gait problem, joint swelling, neck pain and neck stiffness.  Skin: Negative.   Neurological: Negative.   Psychiatric/Behavioral: Negative.     Per HPI unless specifically indicated above     Objective:    BP 116/78    Pulse 76    Temp 98.1 F (36.7 C)    Wt 134 lb (60.8 kg)    LMP  (LMP Unknown)    SpO2 96%    BMI 24.51 kg/m   Wt Readings from Last 3 Encounters:  10/01/21 134 lb (60.8 kg)  06/18/21 129 lb (58.5 kg)  03/26/21 127 lb (57.6 kg)    Physical Exam Vitals and nursing note reviewed.  Constitutional:      General: She is not in acute distress.    Appearance: Normal appearance. She is not  ill-appearing, toxic-appearing or diaphoretic.  HENT:     Head: Normocephalic and atraumatic.     Right Ear: External ear normal.     Left Ear: External ear normal.     Nose: Nose normal.     Mouth/Throat:     Mouth: Mucous membranes are moist.     Pharynx: Oropharynx is clear.  Eyes:     General: No scleral icterus.       Right eye: No discharge.        Left eye: No discharge.     Extraocular Movements: Extraocular movements intact.     Conjunctiva/sclera: Conjunctivae normal.     Pupils: Pupils are equal, round, and reactive to light.  Cardiovascular:     Rate and Rhythm: Normal rate and regular rhythm.     Pulses: Normal pulses.     Heart sounds: Normal heart sounds. No murmur heard.   No friction rub. No gallop.  Pulmonary:     Effort: Pulmonary effort is normal. No respiratory distress.     Breath sounds: Normal breath sounds. No stridor. No wheezing, rhonchi or rales.  Chest:     Chest wall: No tenderness.  Musculoskeletal:        General: Normal range of motion.     Cervical back: Normal range of motion and neck supple.  Skin:    General: Skin is warm and dry.     Capillary Refill: Capillary  refill takes less than 2 seconds.     Coloration: Skin is not jaundiced or pale.     Findings: No bruising, erythema, lesion or rash.  Neurological:     General: No focal deficit present.     Mental Status: She is alert. Mental status is at baseline. She is disoriented.  Psychiatric:        Mood and Affect: Mood normal.        Behavior: Behavior normal.        Thought Content: Thought content normal.        Judgment: Judgment normal.    Results for orders placed or performed in visit on 03/26/21  Comprehensive metabolic panel  Result Value Ref Range   Glucose 109 (H) 65 - 99 mg/dL   BUN 13 8 - 27 mg/dL   Creatinine, Ser 0.89 0.57 - 1.00 mg/dL   eGFR 66 >59 mL/min/1.73   BUN/Creatinine Ratio 15 12 - 28   Sodium 142 134 - 144 mmol/L   Potassium 4.2 3.5 - 5.2 mmol/L    Chloride 102 96 - 106 mmol/L   CO2 23 20 - 29 mmol/L   Calcium 9.7 8.7 - 10.3 mg/dL   Total Protein 7.0 6.0 - 8.5 g/dL   Albumin 4.3 3.7 - 4.7 g/dL   Globulin, Total 2.7 1.5 - 4.5 g/dL   Albumin/Globulin Ratio 1.6 1.2 - 2.2   Bilirubin Total 0.3 0.0 - 1.2 mg/dL   Alkaline Phosphatase 112 44 - 121 IU/L   AST 15 0 - 40 IU/L   ALT 8 0 - 32 IU/L  CBC with Differential/Platelet  Result Value Ref Range   WBC 6.2 3.4 - 10.8 x10E3/uL   RBC 4.56 3.77 - 5.28 x10E6/uL   Hemoglobin 14.1 11.1 - 15.9 g/dL   Hematocrit 41.6 34.0 - 46.6 %   MCV 91 79 - 97 fL   MCH 30.9 26.6 - 33.0 pg   MCHC 33.9 31.5 - 35.7 g/dL   RDW 12.5 11.7 - 15.4 %   Platelets 213 150 - 450 x10E3/uL   Neutrophils 53 Not Estab. %   Lymphs 33 Not Estab. %   Monocytes 9 Not Estab. %   Eos 3 Not Estab. %   Basos 1 Not Estab. %   Neutrophils Absolute 3.4 1.4 - 7.0 x10E3/uL   Lymphocytes Absolute 2.1 0.7 - 3.1 x10E3/uL   Monocytes Absolute 0.6 0.1 - 0.9 x10E3/uL   EOS (ABSOLUTE) 0.2 0.0 - 0.4 x10E3/uL   Basophils Absolute 0.0 0.0 - 0.2 x10E3/uL   Immature Granulocytes 1 Not Estab. %   Immature Grans (Abs) 0.0 0.0 - 0.1 x10E3/uL  Lipid Panel w/o Chol/HDL Ratio  Result Value Ref Range   Cholesterol, Total 202 (H) 100 - 199 mg/dL   Triglycerides 100 0 - 149 mg/dL   HDL 91 >39 mg/dL   VLDL Cholesterol Cal 17 5 - 40 mg/dL   LDL Chol Calc (NIH) 94 0 - 99 mg/dL  TSH  Result Value Ref Range   TSH 1.860 0.450 - 4.500 uIU/mL  VITAMIN D 25 Hydroxy (Vit-D Deficiency, Fractures)  Result Value Ref Range   Vit D, 25-Hydroxy 26.3 (L) 30.0 - 100.0 ng/mL      Assessment & Plan:   Problem List Items Addressed This Visit       Digestive   GERD (gastroesophageal reflux disease)    Under good control on current regimen. Continue current regimen. Continue to monitor. Call with any concerns. Refills given. Labs drawn today.  Relevant Medications   omeprazole (PRILOSEC) 20 MG capsule   Other Relevant Orders   CBC with  Differential/Platelet   Comprehensive metabolic panel     Nervous and Auditory   Mild cognitive disorder    Stable. Continue to follow with neurology.       Parkinson disease ([place])    Continue to follow with neurology. Call with any concerns.       Relevant Medications   gabapentin (NEURONTIN) 300 MG capsule     Musculoskeletal and Integument   Degenerative joint disease (DJD) of lumbar spine    Uncontrolled. No longer seeing ortho or neurosurgery. Will increase her gabapentin and recheck 3 months.         Genitourinary   Benign hypertensive renal disease    Under good control on current regimen. Continue current regimen. Continue to monitor. Call with any concerns. Refills given. Labs drawn today.       Relevant Orders   CBC with Differential/Platelet   Comprehensive metabolic panel   TSH   Microalbumin, Urine Waived   CKD (chronic kidney disease) stage 2, GFR 60-89 ml/min    Under good control on current regimen. Continue current regimen. Continue to monitor. Call with any concerns. Refills given. Labs drawn today.       Relevant Orders   CBC with Differential/Platelet   Comprehensive metabolic panel   Urinalysis, Routine w reflex microscopic     Other   Hyperlipidemia    Under good control on current regimen. Continue current regimen. Continue to monitor. Call with any concerns. Refills given. Labs drawn today.       Relevant Medications   amLODipine (NORVASC) 2.5 MG tablet   atorvastatin (LIPITOR) 40 MG tablet   losartan (COZAAR) 50 MG tablet   Other Relevant Orders   CBC with Differential/Platelet   Comprehensive metabolic panel   Lipid Panel w/o Chol/HDL Ratio   Vitamin D deficiency disease    Rechecking labs today. Await results. Treat as needed.       Relevant Orders   CBC with Differential/Platelet   Comprehensive metabolic panel   VITAMIN D 25 Hydroxy (Vit-D Deficiency, Fractures)   MDD (major depressive disorder), recurrent episode, moderate  ([place]) - Primary    Under good control on current regimen. Continue current regimen. Continue to monitor. Call with any concerns. Refills given. Labs drawn today.       Relevant Medications   DULoxetine (CYMBALTA) 30 MG capsule   Other Visit Diagnoses     Bilateral foot pain       Will refer to podiatry. Call with any concerns.    Relevant Orders   Ambulatory referral to Podiatry   Need for influenza vaccination       Relevant Orders   Flu Vaccine QUAD High Dose(Fluad) (Completed)        Follow up plan: Return in about 3 months (around 12/29/2021).

## 2021-10-02 LAB — CBC WITH DIFFERENTIAL/PLATELET
Basophils Absolute: 0 10*3/uL (ref 0.0–0.2)
Basos: 0 %
EOS (ABSOLUTE): 0.2 10*3/uL (ref 0.0–0.4)
Eos: 3 %
Hematocrit: 39.6 % (ref 34.0–46.6)
Hemoglobin: 13.2 g/dL (ref 11.1–15.9)
Immature Grans (Abs): 0 10*3/uL (ref 0.0–0.1)
Immature Granulocytes: 0 %
Lymphocytes Absolute: 1.7 10*3/uL (ref 0.7–3.1)
Lymphs: 23 %
MCH: 31.5 pg (ref 26.6–33.0)
MCHC: 33.3 g/dL (ref 31.5–35.7)
MCV: 95 fL (ref 79–97)
Monocytes Absolute: 0.8 10*3/uL (ref 0.1–0.9)
Monocytes: 10 %
Neutrophils Absolute: 4.7 10*3/uL (ref 1.4–7.0)
Neutrophils: 64 %
Platelets: 163 10*3/uL (ref 150–450)
RBC: 4.19 x10E6/uL (ref 3.77–5.28)
RDW: 12.2 % (ref 11.7–15.4)
WBC: 7.5 10*3/uL (ref 3.4–10.8)

## 2021-10-02 LAB — LIPID PANEL W/O CHOL/HDL RATIO
Cholesterol, Total: 186 mg/dL (ref 100–199)
HDL: 75 mg/dL (ref >39)
LDL Chol Calc (NIH): 90 mg/dL (ref 0–99)
Triglycerides: 119 mg/dL (ref 0–149)
VLDL Cholesterol Cal: 21 mg/dL (ref 5–40)

## 2021-10-02 LAB — COMPREHENSIVE METABOLIC PANEL
ALT: 6 IU/L (ref 0–32)
AST: 11 IU/L (ref 0–40)
Albumin/Globulin Ratio: 1.7 (ref 1.2–2.2)
Albumin: 4.2 g/dL (ref 3.7–4.7)
Alkaline Phosphatase: 95 IU/L (ref 44–121)
BUN/Creatinine Ratio: 13 (ref 12–28)
BUN: 12 mg/dL (ref 8–27)
Bilirubin Total: 0.2 mg/dL (ref 0.0–1.2)
CO2: 25 mmol/L (ref 20–29)
Calcium: 9 mg/dL (ref 8.7–10.3)
Chloride: 102 mmol/L (ref 96–106)
Creatinine, Ser: 0.91 mg/dL (ref 0.57–1.00)
Globulin, Total: 2.5 g/dL (ref 1.5–4.5)
Glucose: 129 mg/dL — ABNORMAL HIGH (ref 70–99)
Potassium: 4.3 mmol/L (ref 3.5–5.2)
Sodium: 143 mmol/L (ref 134–144)
Total Protein: 6.7 g/dL (ref 6.0–8.5)
eGFR: 64 mL/min/{1.73_m2} (ref >59)

## 2021-10-02 LAB — TSH: TSH: 1.31 u[IU]/mL (ref 0.450–4.500)

## 2021-10-02 LAB — VITAMIN D 25 HYDROXY (VIT D DEFICIENCY, FRACTURES): Vit D, 25-Hydroxy: 35.7 ng/mL (ref 30.0–100.0)

## 2021-10-22 ENCOUNTER — Ambulatory Visit (INDEPENDENT_AMBULATORY_CARE_PROVIDER_SITE_OTHER): Payer: Medicare Other | Admitting: Podiatry

## 2021-10-22 ENCOUNTER — Ambulatory Visit (INDEPENDENT_AMBULATORY_CARE_PROVIDER_SITE_OTHER): Payer: Medicare Other

## 2021-10-22 ENCOUNTER — Encounter: Payer: Self-pay | Admitting: Podiatry

## 2021-10-22 ENCOUNTER — Other Ambulatory Visit: Payer: Self-pay

## 2021-10-22 DIAGNOSIS — M19072 Primary osteoarthritis, left ankle and foot: Secondary | ICD-10-CM

## 2021-10-22 DIAGNOSIS — M19079 Primary osteoarthritis, unspecified ankle and foot: Secondary | ICD-10-CM

## 2021-10-22 DIAGNOSIS — M2041 Other hammer toe(s) (acquired), right foot: Secondary | ICD-10-CM | POA: Diagnosis not present

## 2021-10-22 DIAGNOSIS — M7752 Other enthesopathy of left foot: Secondary | ICD-10-CM | POA: Diagnosis not present

## 2021-10-22 DIAGNOSIS — M19071 Primary osteoarthritis, right ankle and foot: Secondary | ICD-10-CM | POA: Diagnosis not present

## 2021-10-22 MED ORDER — BETAMETHASONE SOD PHOS & ACET 6 (3-3) MG/ML IJ SUSP
3.0000 mg | Freq: Once | INTRAMUSCULAR | Status: AC
  Administered 2021-10-22: 3 mg via INTRA_ARTICULAR

## 2021-10-22 NOTE — Progress Notes (Signed)
? ?HPI: 81 y.o. female presenting today as a new patient with her husband for evaluation of left ankle pain and right fifth toe pain.  Patient does have a history of bunionectomy and tailor's bunionectomy surgeries.  She says that the fifth toe is elevated and it hurts and rubs against her shoes.  She also experiences left ankle pain on a daily basis.  She presents for further treatment and evaluation ? ?Past Medical History:  ?Diagnosis Date  ? Allergy   ? Anxiety   ? Atypical mole 04/14/2021  ? ATYPICAL JUNCTIONAL LENTIGINOUS MELANOCYTIC PROLIFERATION, R lower elbow, exc 07/05/2021  ? [facility])   ? SKIN  ? CKD (chronic kidney disease) stage 2, GFR 60-89 ml/min   ? Depression   ? Edema   ? LEGS/FEET  ? GERD (gastroesophageal reflux disease)   ? Heart murmur   ? Hx of basal cell carcinoma 10/20/2009  ? R lateral infraorbital  ? Hx of basal cell carcinoma 06/30/2015  ? R infra orbital scar  ? Hyperlipidemia   ? Hypertension   ? Hypopotassemia   ? Microalbuminuria   ? Osteoarthritis   ? feet, legs  ? Osteopenia   ? 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 Urinary incontinence   ? Vitamin D deficiency disease   ? Wears dentures   ? partial lower  ? Wheezing   ? ?Past Surgical History:  ?Procedure Laterality Date  ? BLADDER SURGERY    ? x 2  ? BUNIONECTOMY    ? CATARACT EXTRACTION W/PHACO Right 11/23/2015  ? Procedure: CATARACT EXTRACTION PHACO AND INTRAOCULAR LENS PLACEMENT (IOC);  Surgeon: Estill Cotta, MD;  Location: [facility] ORS;  Service: Ophthalmology;  Laterality: Right;  [place]    1:30.9 ?AP%  26.9 ?CDE   42.29 ?flud casette lot # [id number]  exp05/31/2018  ? CATARACT EXTRACTION W/PHACO Left 05/12/2020  ? Procedure: CATARACT EXTRACTION PHACO AND INTRAOCULAR LENS PLACEMENT (IOC) LEFT 15.24 01:28.8;  Surgeon: Birder Robson, MD;  Location: [place];  Service: Ophthalmology;  Laterality: Left;  ? COLONOSCOPY    ? TOTAL ABDOMINAL HYSTERECTOMY  08/22/2002  ? Total (due to bladder surgery)   ? ?Allergies  ?Allergen Reactions  ? Codeine Itching  ? Lisinopril Cough  ? ? ? ?Objective: ?Physical Exam ?General: The patient is alert and oriented x3 in no acute distress. ? ?Dermatology: Skin is cool, dry and supple bilateral lower extremities. Negative for open lesions or macerations. ? ?Vascular: Palpable pedal pulses bilaterally. No edema or erythema noted. Capillary refill within normal limits. ? ?Neurological: Epicritic and protective threshold grossly intact bilaterally.  ? ?Musculoskeletal Exam: All pedal and ankle joints range of motion within normal limits bilateral. Muscle strength 5/5 in all groups bilateral.  Reducible hammertoe contracture deformity noted to the symptomatic toe.  There is pain on palpation to the left ankle joint with a pes planovalgus and medial longitudinal arch collapse of the foot ? ?Radiographic exam: Degenerative changes noted to the left ankle with a pes planovalgus deformity especially visible on lateral view.  The fifth digit to the right foot is also in an elevated position.  Hardware noted to the first and fifth metatarsals bilateral with stable routine healing ? ?Assessment: ?1.  Reducible elevated fifth toe secondary to extensor tendon pull right ?2.  Capsulitis/DJD left ankle ? ? ?Plan of Care:  ?1. Patient evaluated.  Injection of 0.5 cc Celestone Soluspan injected in the left ankle joint ?2.  Different treatment options were discussed with the patient  and husband today ?3.  After evaluating the patient I do believe that an extensor tenotomy to the respective digit would help alleviate the patient's symptoms and lower the toe in better alignment with the adjacent lesser digits.  This would drop the toe to alleviate pressure from the digit also in shoe gear.  The procedure was explained in detail and all patient questions were answered.  No guarantees were expressed or implied.  The patient consented for correction here in the office ?4.  Prior to procedure the toe  was blocked in a digital block fashion using 3 mL of lidocaine 2%. ?5.  Extensor tenotomy was performed of the respective digit using a surgical #11 scalpel and a small percutaneous stab incision on the dorsal aspect of the toe just proximal to the MTP joint.  The toe was immediately in a more rectus position.  Betadine soaked dry sterile dressing was applied. ?6.  Post care instructions were provided ?7.  Surgical shoe dispensed ?8.  Return to clinic in 1 week ?  ? ?Edrick Kins, DPM ?[place] ? ?Dr. Edrick Kins, DPM  ?  ?[address]                                        ?[place], [place] [address]                ?Office [phone]  ?Fax [phone] ? ?

## 2021-10-29 ENCOUNTER — Other Ambulatory Visit: Payer: Self-pay

## 2021-10-29 ENCOUNTER — Ambulatory Visit (INDEPENDENT_AMBULATORY_CARE_PROVIDER_SITE_OTHER): Payer: Medicare Other | Admitting: Podiatry

## 2021-10-29 ENCOUNTER — Encounter: Payer: Self-pay | Admitting: Podiatry

## 2021-10-29 DIAGNOSIS — M7752 Other enthesopathy of left foot: Secondary | ICD-10-CM

## 2021-10-29 NOTE — Progress Notes (Signed)
? ?HPI: 81 y.o. female presenting today status post extensor tenotomy of the right fifth digit that was performed 10/22/2021 in the office.  Also for follow-up evaluation regarding left ankle pain.  Patient states that she feels much better to both feet.  No new complaints at this time ? ?Past Medical History:  ?Diagnosis Date  ? Allergy   ? Anxiety   ? Atypical mole 04/14/2021  ? ATYPICAL JUNCTIONAL LENTIGINOUS MELANOCYTIC PROLIFERATION, R lower elbow, exc 07/05/2021  ? [facility])   ? SKIN  ? CKD (chronic kidney disease) stage 2, GFR 60-89 ml/min   ? Depression   ? Edema   ? LEGS/FEET  ? GERD (gastroesophageal reflux disease)   ? Heart murmur   ? Hx of basal cell carcinoma 10/20/2009  ? R lateral infraorbital  ? Hx of basal cell carcinoma 06/30/2015  ? R infra orbital scar  ? Hyperlipidemia   ? Hypertension   ? Hypopotassemia   ? Microalbuminuria   ? Osteoarthritis   ? feet, legs  ? Osteopenia   ? 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 Urinary incontinence   ? Vitamin D deficiency disease   ? Wears dentures   ? partial lower  ? Wheezing   ? ? ?Past Surgical History:  ?Procedure Laterality Date  ? BLADDER SURGERY    ? x 2  ? BUNIONECTOMY    ? CATARACT EXTRACTION W/PHACO Right 11/23/2015  ? Procedure: CATARACT EXTRACTION PHACO AND INTRAOCULAR LENS PLACEMENT (IOC);  Surgeon: Estill Cotta, MD;  Location: [facility] ORS;  Service: Ophthalmology;  Laterality: Right;  [place]    1:30.9 ?AP%  26.9 ?CDE   42.29 ?flud casette lot # [id number]  exp05/31/2018  ? CATARACT EXTRACTION W/PHACO Left 05/12/2020  ? Procedure: CATARACT EXTRACTION PHACO AND INTRAOCULAR LENS PLACEMENT (IOC) LEFT 15.24 01:28.8;  Surgeon: Birder Robson, MD;  Location: [place];  Service: Ophthalmology;  Laterality: Left;  ? COLONOSCOPY    ? TOTAL ABDOMINAL HYSTERECTOMY  08/22/2002  ? Total (due to bladder surgery)  ? ? ?Allergies  ?Allergen Reactions  ? Codeine Itching  ? Lisinopril Cough  ? ?  ?Physical Exam: ?General: The patient is  alert and oriented x3 in no acute distress. ? ?Dermatology: Skin is warm, dry and supple bilateral lower extremities. Negative for open lesions or macerations.  The percutaneous stab incision to the extensor tenotomy of the right fifth digit appears to have been healed.  There is some slight bruising around the area however but there is no erythema or edema and no clinical evidence of infection ? ?Vascular: Palpable pedal pulses bilaterally. Capillary refill within normal limits.  Slight bruising to the extensor tenotomy site of the right foot ? ?Neurological: Light touch and protective threshold grossly intact ? ?Musculoskeletal Exam: The right fifth digit is in a more plantarflexed position.  Significant improvement.  Negative for any ankle pain left ? ?Assessment: ?1.  Extended toe right fifth digit; resolved.  S/P extensor tenotomy 10/22/2021 ?2.  Left ankle capsulitis; resolved ? ? ?Plan of Care:  ?1. Patient evaluated.  ?2.  Patient may resume full activity no restrictions ?3.  Recommend good supportive shoes and sneakers ?4.  Return to clinic as needed ? ?  ?  ?Edrick Kins, DPM ?[place] ? ?Dr. Edrick Kins, DPM  ?  ?[address]                                        ?  [place], [place] [address]                ?Office [phone]  ?Fax [phone] ? ? ? ? ?

## 2021-11-22 ENCOUNTER — Other Ambulatory Visit: Payer: Self-pay | Admitting: Family Medicine

## 2021-11-23 NOTE — Telephone Encounter (Signed)
Requested medication (s) are due for refill today: yes ? ?Requested medication (s) are on the active medication list: yes ? ?Last refill:  12/24/20 ? ?Future visit scheduled: yes ? ?Notes to clinic:  Unable to refill per protocol, cannot delegate.  ? ? ?  ?Requested Prescriptions  ?Pending Prescriptions Disp Refills  ? fluticasone (FLONASE) 50 MCG/ACT nasal spray [Pharmacy Med Name: Fluticasone Propionate 68mg/actuation Nasal Spray]  2  ?  Sig: Instill 2 sprays in each nostril daily.  ?  ? Not Delegated - Ear, Nose, and Throat: Nasal Preparations - Corticosteroids Failed - 11/22/2021  6:01 AM  ?  ?  Failed - This refill cannot be delegated  ?  ?  Passed - Valid encounter within last 12 months  ?  Recent Outpatient Visits   ? ?      ? 1 month ago MDD (major depressive disorder), recurrent episode, moderate ([place]  ? CM[place] Megan P, DO  ? 8 months ago Gastroesophageal reflux disease, unspecified whether esophagitis present  ? CC[place] Megan P, DO  ? 11 months ago Gastroesophageal reflux disease, unspecified whether esophagitis present  ? [place], DO  ? 1 year ago Benign hypertensive renal disease  ? [place], DO  ? 1 year ago Benign hypertensive renal disease  ? [place], DO  ? ?  ?  ?Future Appointments   ? ?        ? In 1 month JWynetta Emery MBarb Merino DO [facility]  ? In 1 month SBrendolyn Patty MD [facility] ? In 6 months  [facility]  ? ?  ? ?  ?  ?  ? ? ?

## 2021-12-16 DIAGNOSIS — R251 Tremor, unspecified: Secondary | ICD-10-CM | POA: Diagnosis not present

## 2021-12-16 DIAGNOSIS — G479 Sleep disorder, unspecified: Secondary | ICD-10-CM | POA: Diagnosis not present

## 2021-12-16 DIAGNOSIS — R413 Other amnesia: Secondary | ICD-10-CM | POA: Diagnosis not present

## 2021-12-17 DIAGNOSIS — M3501 Sicca syndrome with keratoconjunctivitis: Secondary | ICD-10-CM | POA: Diagnosis not present

## 2021-12-30 ENCOUNTER — Encounter: Payer: Self-pay | Admitting: Family Medicine

## 2021-12-30 ENCOUNTER — Ambulatory Visit (INDEPENDENT_AMBULATORY_CARE_PROVIDER_SITE_OTHER): Payer: Medicare Other | Admitting: Family Medicine

## 2021-12-30 VITALS — BP 124/81 | HR 69 | Temp 98.1°F | Wt 134.8 lb

## 2021-12-30 DIAGNOSIS — F331 Major depressive disorder, recurrent, moderate: Secondary | ICD-10-CM

## 2021-12-30 MED ORDER — DULOXETINE HCL 20 MG PO CPEP: 20.0000 mg | ORAL_CAPSULE | Freq: Every day | ORAL | 0 refills | Status: DC

## 2021-12-30 NOTE — Progress Notes (Signed)
? ?BP 124/81   Pulse 69   Temp 98.1 ?F (36.7 ?C)   Wt 134 lb 12.8 oz (61.1 kg)   LMP  (LMP Unknown)   SpO2 98%   BMI 24.66 kg/m?   ? ?Subjective:  ? ? Patient ID: Cynthia Vaughan, female    DOB: [birth year], 81 y.o.   MRN: [id number] ? ?HPI: ?Cynthia Vaughan is a 81 y.o. female ? ?Chief Complaint  ?Patient presents with  ? Depression  ? ?ANXIETY/DEPRESSION ?Duration: chronic ?Stable:better ?Anxious mood: no  ?Excessive worrying: no ?Irritability: no  ?Sweating: no ?Nausea: no ?Palpitations:no ?Hyperventilation: no ?Panic attacks: no ?Agoraphobia: no  ?Obscessions/compulsions: no ?Depressed mood: no ? ?  12/30/2021  ?  2:37 PM 10/01/2021  ?  2:44 PM 06/18/2021  ? 11:20 AM 03/26/2021  ?  1:51 PM 03/26/2021  ?  1:33 PM  ?Depression screen PHQ 2/9  ?Decreased Interest 1 2 0 0 0  ?Down, Depressed, Hopeless 0 2 0 0 0  ?PHQ - 2 Score 1 4 0 0 0  ?Altered sleeping 1 2  3    ?Tired, decreased energy 1 2  1    ?Change in appetite 1 2  0   ?Feeling bad or failure about yourself  0 0  0   ?Trouble concentrating 0 0  0   ?Moving slowly or fidgety/restless 0 0  0   ?Suicidal thoughts 0 0  0   ?PHQ-9 Score 4 10  4    ?Difficult doing work/chores Not difficult at all   Not difficult at all   ? ?Anhedonia: no ?Weight changes: no ?Insomnia: no   ?Hypersomnia: no ?Fatigue/loss of energy: yes ?Feelings of worthlessness: no ?Feelings of guilt: no ?Impaired concentration/indecisiveness: no ?Suicidal ideations: no  ?Crying spells: no ?Recent Stressors/Life Changes: no ?  Relationship problems: no ?  Family stress: no   ?  Financial stress: no  ?  Job stress: no  ?  Recent death/loss: no ? ?Relevant past medical, surgical, family and social history reviewed and updated as indicated. Interim medical history since our last visit reviewed. ?Allergies and medications reviewed and updated. ? ?Review of Systems  ?Constitutional: Negative.   ?Respiratory: Negative.    ?Cardiovascular: Negative.   ?Gastrointestinal: Negative.   ?Musculoskeletal:  Negative.   ?Psychiatric/Behavioral: Negative.  Negative for agitation, behavioral problems, confusion, decreased concentration, dysphoric mood, hallucinations, self-injury, sleep disturbance and suicidal ideas. The patient is not nervous/anxious and is not hyperactive.   ? ?Per HPI unless specifically indicated above ? ?   ?Objective:  ?  ?BP 124/81   Pulse 69   Temp 98.1 ?F (36.7 ?C)   Wt 134 lb 12.8 oz (61.1 kg)   LMP  (LMP Unknown)   SpO2 98%   BMI 24.66 kg/m?   ?Wt Readings from Last 3 Encounters:  ?12/30/21 134 lb 12.8 oz (61.1 kg)  ?10/01/21 134 lb (60.8 kg)  ?06/18/21 129 lb (58.5 kg)  ?  ?Physical Exam ?Vitals and nursing note reviewed.  ?Constitutional:   ?   General: She is not in acute distress. ?   Appearance: Normal appearance. She is not ill-appearing, toxic-appearing or diaphoretic.  ?HENT:  ?   Head: Normocephalic and atraumatic.  ?   Right Ear: External ear normal.  ?   Left Ear: External ear normal.  ?   Nose: Nose normal.  ?   Mouth/Throat:  ?   Mouth: Mucous membranes are moist.  ?   Pharynx: Oropharynx is clear.  ?Eyes:  ?  General: No scleral icterus.    ?   Right eye: No discharge.     ?   Left eye: No discharge.  ?   Extraocular Movements: Extraocular movements intact.  ?   Conjunctiva/sclera: Conjunctivae normal.  ?   Pupils: Pupils are equal, round, and reactive to light.  ?Cardiovascular:  ?   Rate and Rhythm: Normal rate and regular rhythm.  ?   Pulses: Normal pulses.  ?   Heart sounds: Normal heart sounds. No murmur heard. ?  No friction rub. No gallop.  ?Pulmonary:  ?   Effort: Pulmonary effort is normal. No respiratory distress.  ?   Breath sounds: Normal breath sounds. No stridor. No wheezing, rhonchi or rales.  ?Chest:  ?   Chest wall: No tenderness.  ?Musculoskeletal:     ?   General: Normal range of motion.  ?   Cervical back: Normal range of motion and neck supple.  ?Skin: ?   General: Skin is warm and dry.  ?   Capillary Refill: Capillary refill takes less than 2 seconds.   ?   Coloration: Skin is not jaundiced or pale.  ?   Findings: No bruising, erythema, lesion or rash.  ?Neurological:  ?   General: No focal deficit present.  ?   Mental Status: She is alert and oriented to person, place, and time. Mental status is at baseline.  ?Psychiatric:     ?   Mood and Affect: Mood normal.     ?   Behavior: Behavior normal.     ?   Thought Content: Thought content normal.     ?   Judgment: Judgment normal.  ? ? ?Results for orders placed or performed in visit on 10/01/21  ?Microscopic Examination  ? Urine  ?Result Value Ref Range  ? WBC, UA None seen 0 - 5 /hpf  ? RBC 0-2 0 - 2 /hpf  ? Epithelial Cells (non renal) 0-10 0 - 10 /hpf  ? Mucus, UA Present (A) Not Estab.  ? Bacteria, UA Few (A) None seen/Few  ?CBC with Differential/Platelet  ?Result Value Ref Range  ? WBC 7.5 3.4 - 10.8 x10E3/uL  ? RBC 4.19 3.77 - 5.28 x10E6/uL  ? Hemoglobin 13.2 11.1 - 15.9 g/dL  ? Hematocrit 39.6 34.0 - 46.6 %  ? MCV 95 79 - 97 fL  ? MCH 31.5 26.6 - 33.0 pg  ? MCHC 33.3 31.5 - 35.7 g/dL  ? RDW 12.2 11.7 - 15.4 %  ? Platelets 163 150 - 450 x10E3/uL  ? Neutrophils 64 Not Estab. %  ? Lymphs 23 Not Estab. %  ? Monocytes 10 Not Estab. %  ? Eos 3 Not Estab. %  ? Basos 0 Not Estab. %  ? Neutrophils Absolute 4.7 1.4 - 7.0 x10E3/uL  ? Lymphocytes Absolute 1.7 0.7 - 3.1 x10E3/uL  ? Monocytes Absolute 0.8 0.1 - 0.9 x10E3/uL  ? EOS (ABSOLUTE) 0.2 0.0 - 0.4 x10E3/uL  ? Basophils Absolute 0.0 0.0 - 0.2 x10E3/uL  ? Immature Granulocytes 0 Not Estab. %  ? Immature Grans (Abs) 0.0 0.0 - 0.1 x10E3/uL  ?Comprehensive metabolic panel  ?Result Value Ref Range  ? Glucose 129 (H) 70 - 99 mg/dL  ? BUN 12 8 - 27 mg/dL  ? Creatinine, Ser 0.91 0.57 - 1.00 mg/dL  ? eGFR 64 >59 mL/min/1.73  ? BUN/Creatinine Ratio 13 12 - 28  ? Sodium 143 134 - 144 mmol/L  ? Potassium 4.3 3.5 - 5.2 mmol/L  ?  Chloride 102 96 - 106 mmol/L  ? CO2 25 20 - 29 mmol/L  ? Calcium 9.0 8.7 - 10.3 mg/dL  ? Total Protein 6.7 6.0 - 8.5 g/dL  ? Albumin 4.2 3.7 - 4.7 g/dL   ? Globulin, Total 2.5 1.5 - 4.5 g/dL  ? Albumin/Globulin Ratio 1.7 1.2 - 2.2  ? Bilirubin Total 0.2 0.0 - 1.2 mg/dL  ? Alkaline Phosphatase 95 44 - 121 IU/L  ? AST 11 0 - 40 IU/L  ? ALT 6 0 - 32 IU/L  ?Lipid Panel w/o Chol/HDL Ratio  ?Result Value Ref Range  ? Cholesterol, Total 186 100 - 199 mg/dL  ? Triglycerides 119 0 - 149 mg/dL  ? HDL 75 >39 mg/dL  ? VLDL Cholesterol Cal 21 5 - 40 mg/dL  ? LDL Chol Calc (NIH) 90 0 - 99 mg/dL  ?Urinalysis, Routine w reflex microscopic  ?Result Value Ref Range  ? Specific Gravity, UA 1.025 1.005 - 1.030  ? pH, UA 7.0 5.0 - 7.5  ? Color, UA Yellow Yellow  ? Appearance Ur Cloudy (A) Clear  ? Leukocytes,UA Negative Negative  ? Protein,UA Negative Negative/Trace  ? Glucose, UA Negative Negative  ? Ketones, UA Negative Negative  ? RBC, UA Trace (A) Negative  ? Bilirubin, UA Negative Negative  ? Urobilinogen, Ur 1.0 0.2 - 1.0 mg/dL  ? Nitrite, UA Negative Negative  ? Microscopic Examination See below:   ?TSH  ?Result Value Ref Range  ? TSH 1.310 0.450 - 4.500 uIU/mL  ?Microalbumin, Urine Waived  ?Result Value Ref Range  ? Microalb, Ur Waived 30 (H) 0 - 19 mg/L  ? Creatinine, Urine Waived 100 10 - 300 mg/dL  ? Microalb/Creat Ratio <30 <30 mg/g  ?VITAMIN D 25 Hydroxy (Vit-D Deficiency, Fractures)  ?Result Value Ref Range  ? Vit D, 25-Hydroxy 35.7 30.0 - 100.0 ng/mL  ? ?   ?Assessment & Plan:  ? ?Problem List Items Addressed This Visit   ? ?  ? Other  ? MDD (major depressive disorder), recurrent episode, moderate ([place]) - Primary  ?  Doing very well. Will cut her cymbalta down to 21m and recheck in 3 months. Call with any concerns.  ? ?  ?  ? Relevant Medications  ? DULoxetine (CYMBALTA) 20 MG capsule  ?  ? ?Follow up plan: ?Return in about 3 months (around 04/01/2022). ? ? ? ? ? ?

## 2021-12-30 NOTE — Assessment & Plan Note (Signed)
Doing very well. Will cut her cymbalta down to '20mg'$  and recheck in 3 months. Call with any concerns.  ?

## 2022-01-03 ENCOUNTER — Telehealth: Payer: Self-pay

## 2022-01-03 NOTE — Telephone Encounter (Signed)
-----   Message from [name], DO sent at 12/30/2021  2:32 PM EDT ----- ?Please cancel her '30mg'$  cymbalta- decreasing to 20 with mail order ? ?

## 2022-01-03 NOTE — Telephone Encounter (Signed)
Spoke with [employer] and he informed me that he has already spoken with someone and got patient prescription for 30 MG Cymbalta per Dr.Johnson's request. Erlene Quan says the new prescription of 20 MG Cymbalta is scheduled to ship out on 01/16/22. ?

## 2022-01-18 ENCOUNTER — Ambulatory Visit (INDEPENDENT_AMBULATORY_CARE_PROVIDER_SITE_OTHER): Payer: Medicare Other | Admitting: Dermatology

## 2022-01-18 ENCOUNTER — Encounter: Payer: Self-pay | Admitting: Dermatology

## 2022-01-18 DIAGNOSIS — Z85828 Personal history of other malignant neoplasm of skin: Secondary | ICD-10-CM

## 2022-01-18 DIAGNOSIS — D1801 Hemangioma of skin and subcutaneous tissue: Secondary | ICD-10-CM

## 2022-01-18 DIAGNOSIS — L570 Actinic keratosis: Secondary | ICD-10-CM

## 2022-01-18 DIAGNOSIS — D225 Melanocytic nevi of trunk: Secondary | ICD-10-CM

## 2022-01-18 DIAGNOSIS — Z86018 Personal history of other benign neoplasm: Secondary | ICD-10-CM | POA: Diagnosis not present

## 2022-01-18 DIAGNOSIS — Z1283 Encounter for screening for malignant neoplasm of skin: Secondary | ICD-10-CM

## 2022-01-18 DIAGNOSIS — L814 Other melanin hyperpigmentation: Secondary | ICD-10-CM | POA: Diagnosis not present

## 2022-01-18 DIAGNOSIS — D2239 Melanocytic nevi of other parts of face: Secondary | ICD-10-CM

## 2022-01-18 DIAGNOSIS — L821 Other seborrheic keratosis: Secondary | ICD-10-CM

## 2022-01-18 DIAGNOSIS — L578 Other skin changes due to chronic exposure to nonionizing radiation: Secondary | ICD-10-CM

## 2022-01-18 DIAGNOSIS — D229 Melanocytic nevi, unspecified: Secondary | ICD-10-CM

## 2022-01-18 DIAGNOSIS — L738 Other specified follicular disorders: Secondary | ICD-10-CM | POA: Diagnosis not present

## 2022-01-18 DIAGNOSIS — L988 Other specified disorders of the skin and subcutaneous tissue: Secondary | ICD-10-CM

## 2022-01-18 NOTE — Progress Notes (Signed)
Follow-Up Visit   Subjective  Cynthia Vaughan is a 81 y.o. female who presents for the following: Annual Exam (Upper body skin cancer screening. 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 Hx of BCC's. Would like to discuss treatment for bags under eyes).  The patient presents for Upper Body Skin Exam (UBSE) for skin cancer screening and mole check.  The patient has spots, moles and lesions to be evaluated, some may be new or changing and the patient has concerns that these could be cancer.   The following portions of the chart were reviewed this encounter and updated as appropriate:      Review of Systems: No other skin or systemic complaints except as noted in HPI or Assessment and Plan.   Objective  Well appearing patient in no apparent distress; mood and affect are within normal limits.  All skin waist up examined.  Right Antecubital Fossa 0.6 cm tan macule, lighter center  Left Ala Nasi 0.2 cm pink-white smooth firm papule  Right Zygomatic Area x1 Pink scaly papule within scar  Left Malar Cheek Infraoccular skin with fulness   Assessment & Plan   History of Basal Cell Carcinoma of the Skin - No evidence of recurrence today - Recommend regular full body skin exams - Recommend daily broad spectrum sunscreen SPF 30+ to sun-exposed areas, reapply every 2 hours as needed.  - Call if any new or changing lesions are noted between office visits    History of Atypical melanocytic proliferation. Right lower elbow. 07/05/2021 s/p excision - No evidence of recurrence today - Recommend regular full body skin exams - Recommend daily broad spectrum sunscreen SPF 30+ to sun-exposed areas, reapply every 2 hours as needed.  - Call if any new or changing lesions are noted between office visits   Lentigo Right Antecubital Fossa  Benign-appearing.  Observation.  Call clinic for new or changing lesions.  Recommend daily use of broad spectrum spf 30+ sunscreen to sun-exposed areas.     Fibrous papule of skin Left Ala Nasi  Vs milia   Benign-appearing.  Observation.  Call clinic for new or changing lesions.  Recommend daily use of broad spectrum spf 30+ sunscreen to sun-exposed areas.    AK (actinic keratosis) Right Zygomatic Area x1  Vs ISK  Actinic keratoses are precancerous spots that appear secondary to cumulative UV radiation exposure/sun exposure over time. They are chronic with expected duration over 1 year. A portion of actinic keratoses will progress to squamous cell carcinoma of the skin. It is not possible to reliably predict which spots will progress to skin cancer and so treatment is recommended to prevent development of skin cancer.  Recommend daily broad spectrum sunscreen SPF 30+ to sun-exposed areas, reapply every 2 hours as needed.  Recommend staying in the shade or wearing long sleeves, sun glasses (UVA+UVB protection) and wide brim hats (4-inch brim around the entire circumference of the hat). Call for new or changing lesions.  Destruction of lesion - Right Zygomatic Area x1  Destruction method: cryotherapy   Informed consent: discussed and consent obtained   Lesion destroyed using liquid nitrogen: Yes   Region frozen until ice ball extended beyond lesion: Yes   Outcome: patient tolerated procedure well with no complications   Post-procedure details: wound care instructions given   Additional details:  Prior to procedure, discussed risks of blister formation, small wound, skin dyspigmentation, or rare scar following cryotherapy. Recommend Vaseline ointment to treated areas while healing.   Elastosis of  skin Left Malar Cheek  Recommend blepharoplasty with Dr. Norlene Duel, contact info given, likely not covered by insurance   Lentigines - Scattered tan macules - Due to sun exposure - Benign-appearing, observe - Recommend daily broad spectrum sunscreen SPF 30+ to sun-exposed areas, reapply every 2 hours as needed. - Call for any  changes  Seborrheic Keratoses - Stuck-on, waxy, tan-brown papules and/or plaques  - Benign-appearing - Discussed benign etiology and prognosis. - Observe - Call for any changes  Melanocytic Nevi. Face, back - Tan-brown and/or pink-flesh-colored symmetric macules and papules - Benign appearing on exam today - Observation - Call clinic for new or changing moles - Recommend daily use of broad spectrum spf 30+ sunscreen to sun-exposed areas.   Hemangiomas. Torso - Red papules - Discussed benign nature - Observe - Call for any changes  Actinic Damage - Chronic condition, secondary to cumulative UV/sun exposure - diffuse scaly erythematous macules with underlying dyspigmentation - Recommend daily broad spectrum sunscreen SPF 30+ to sun-exposed areas, reapply every 2 hours as needed.  - Staying in the shade or wearing long sleeves, sun glasses (UVA+UVB protection) and wide brim hats (4-inch brim around the entire circumference of the hat) are also recommended for sun protection.  - Call for new or changing lesions.  Sebaceous Hyperplasia. Forehead. - Small yellow papules with a central dell - Benign - Observe   Skin cancer screening performed today.   Return in about 6 months (around 07/21/2022) for TBSE.  I, Emelia Salisbury, CMA, am acting as scribe for Brendolyn Patty, MD.  Documentation: I have reviewed the above documentation for accuracy and completeness, and I agree with the above.  Brendolyn Patty MD

## 2022-01-18 NOTE — Patient Instructions (Addendum)
Recommend daily broad spectrum sunscreen SPF 30+ to sun-exposed areas, reapply every 2 hours as needed. Call for new or changing lesions.  Staying in the shade or wearing long sleeves, sun glasses (UVA+UVB protection) and wide brim hats (4-inch brim around the entire circumference of the hat) are also recommended for sun protection.   Recommend plastic surgeon to help with bags under eyes. For Blepharoplasty at lower eyelids. Dr. Norlene Duel at [facility] [address], [place], [place] [address] Phone: [phone]   Melanoma ABCDEs  Melanoma is the most dangerous type of skin cancer, and is the leading cause of death from skin disease.  You are more likely to develop melanoma if you: Have light-colored skin, light-colored eyes, or red or blond hair Spend a lot of time in the sun Tan regularly, either outdoors or in a tanning bed Have had blistering sunburns, especially during childhood Have a close family member who has had a melanoma Have atypical moles or large birthmarks  Early detection of melanoma is key since treatment is typically straightforward and cure rates are extremely high if we catch it early.   The first sign of melanoma is often a change in a mole or a new dark spot.  The ABCDE system is a way of remembering the signs of melanoma.  A for asymmetry:  The two halves do not match. B for border:  The edges of the growth are irregular. C for color:  A mixture of colors are present instead of an even brown color. D for diameter:  Melanomas are usually (but not always) greater than 38m - the size of a pencil eraser. E for evolution:  The spot keeps changing in size, shape, and color.  Please check your skin once per month between visits. You can use a small mirror in front and a large mirror behind you to keep an eye on the back side or your body.   If you see any new or changing lesions before your next follow-up, please call to schedule a visit.  Please  continue daily skin protection including broad spectrum sunscreen SPF 30+ to sun-exposed areas, reapplying every 2 hours as needed when you're outdoors.   Staying in the shade or wearing long sleeves, sun glasses (UVA+UVB protection) and wide brim hats (4-inch brim around the entire circumference of the hat) are also recommended for sun protection.     If You Need Anything After Your Visit  If you have any questions or concerns for your doctor, please call our main line at [phone] press option 4 to reach your doctor's medical assistant. If no one answers, please leave a voicemail as directed and we will return your call as soon as possible. Messages left after 4 pm will be answered the following business day.   You may also send [place] message via MAline We typically respond to MyChart messages within 1-2 business days.  For prescription refills, please ask your pharmacy to contact our office. Our fax number is [phone]  If you have an urgent issue when the clinic is closed that cannot wait until the next business day, you can page your doctor at the number below.    Please note that while we do our best to be available for urgent issues outside of office hours, we are not available 24/7.   If you have an urgent issue and are unable to reach [place] you may choose to seek medical care at your doctor's office, retail clinic,  urgent care center, or emergency room.  If you have a medical emergency, please immediately call 911 or go to the emergency department.  Pager Numbers  - Dr. Nehemiah Massed: [phone]  - Dr. Laurence Ferrari: [phone]  - Dr. Nicole Kindred: [phone]  In the event of inclement weather, please call our main line at [phone] for an update on the status of any delays or closures.  Dermatology Medication Tips: Please keep the boxes that topical medications come in in order to help keep track of the instructions about where and how to use these. Pharmacies typically  print the medication instructions only on the boxes and not directly on the medication tubes.   If your medication is too expensive, please contact our office at [phone] option 4 or send [place] a message through [place].   We are unable to tell what your co-pay for medications will be in advance as this is different depending on your insurance coverage. However, we may be able to find a substitute medication at lower cost or fill out paperwork to get insurance to cover a needed medication.   If a prior authorization is required to get your medication covered by your insurance company, please allow [place] 1-2 business days to complete this process.  Drug prices often vary depending on where the prescription is filled and some pharmacies may offer cheaper prices.  The website www.goodrx.com contains coupons for medications through different pharmacies. The prices here do not account for what the cost may be with help from insurance (it may be cheaper with your insurance), but the website can give you the price if you did not use any insurance.  - You can print the associated coupon and take it with your prescription to the pharmacy.  - You may also stop by our office during regular business hours and pick up a GoodRx coupon card.  - If you need your prescription sent electronically to a different pharmacy, notify our office through [facility] or by phone at [phone] option 4.     Si Usted Necesita Algo Despus de Su Visita  Tambin puede enviarnos un mensaje a travs de Pharmacist, community. Por lo general respondemos a los mensajes de MyChart en el transcurso de 1 a 2 das hbiles.  Para renovar recetas, por favor pida a su farmacia que se ponga en contacto con nuestra oficina. Harland Dingwall de fax es [place] [phone].  Si tiene un asunto urgente cuando la clnica est cerrada y que no puede esperar hasta el siguiente da hbil, puede llamar/localizar a su doctor(a) al nmero que aparece a  continuacin.   Por favor, tenga en cuenta que aunque hacemos todo lo posible para estar disponibles para asuntos urgentes fuera del horario de [place], no estamos disponibles las 24 horas del da, los 7 das de la [place].   Si tiene un problema urgente y no puede comunicarse con nosotros, puede optar por buscar atencin mdica  en el consultorio de su doctor(a), en una clnica privada, en un centro de atencin urgente o en una sala de emergencias.  Si tiene Engineering geologist, por favor llame inmediatamente al 911 o vaya a la sala de emergencias.  Nmeros de bper  - Dr. Nehemiah Massed: [phone]  - Dra. Moye: [phone]  - Dra. Nicole Kindred: [phone]  En caso de inclemencias del [place], por favor llame a Johnsie Kindred principal al [phone] para una actualizacin sobre el [place] de cualquier retraso o cierre.  Consejos para la medicacin en dermatologa: Por favor, guarde las cajas  en las que vienen los medicamentos de uso tpico para ayudarle a seguir las instrucciones sobre dnde y cmo usarlos. Las farmacias generalmente imprimen las instrucciones del medicamento slo en las cajas y no directamente en los tubos del [place].   Si su medicamento es muy caro, por favor, pngase en contacto con Zigmund Daniel llamando al [phone] y presione la opcin 4 o envenos un mensaje a travs de Pharmacist, community.   No podemos decirle cul ser su copago por los medicamentos por adelantado ya que esto es diferente dependiendo de la cobertura de su seguro. Sin embargo, es posible que podamos encontrar un medicamento sustituto a Electrical engineer un formulario para que el seguro cubra el medicamento que se considera necesario.   Si se requiere una autorizacin previa para que su compaa de seguros [place] su medicamento, por favor permtanos de 1 a 2 das hbiles para completar este proceso.  Los precios de los medicamentos varan con frecuencia dependiendo del Environmental consultant de dnde se surte la receta  y alguna farmacias pueden ofrecer precios ms baratos.  El sitio web www.goodrx.com tiene cupones para medicamentos de Airline pilot. Los precios aqu no tienen en cuenta lo que podra costar con la ayuda del seguro (puede ser ms barato con su seguro), pero el sitio web puede darle el precio si no utiliz Research scientist (physical sciences).  - Puede imprimir el cupn correspondiente y llevarlo con su receta a la farmacia.  - Tambin puede pasar por nuestra oficina durante el horario de atencin regular y Charity fundraiser una tarjeta de cupones de GoodRx.  - Si necesita que su receta se enve electrnicamente a una farmacia diferente, informe a nuestra oficina a travs de MyChart de [place] o por telfono llamando al [phone] y presione la opcin 4.

## 2022-03-22 ENCOUNTER — Other Ambulatory Visit: Payer: Self-pay | Admitting: Family Medicine

## 2022-03-22 DIAGNOSIS — F331 Major depressive disorder, recurrent, moderate: Secondary | ICD-10-CM

## 2022-03-23 NOTE — Telephone Encounter (Signed)
Requested Prescriptions  Pending Prescriptions Disp Refills  . gabapentin (NEURONTIN) 300 MG capsule [Pharmacy Med Name: Gabapentin 362m Capsule] 270 capsule 0    Sig: Take 1 capsule by mouth three times daily.     Neurology: Anticonvulsants - gabapentin Passed - 03/22/2022  5:33 AM      Passed - Cr in normal range and within 360 days    Creatinine  Date Value Ref Range Status  06/08/2014 0.73 0.60 - 1.30 mg/dL Final   Creatinine, Ser  Date Value Ref Range Status  10/01/2021 0.91 0.57 - 1.00 mg/dL Final         Passed - Completed PHQ-2 or PHQ-9 in the last 360 days      Passed - Valid encounter within last 12 months    Recent Outpatient Visits          2 months ago MDD (major depressive disorder), recurrent episode, moderate ([place]   [place]   5 months ago MDD (major depressive disorder), recurrent episode, moderate ([place]   [place], [place]   12 months ago Gastroesophageal reflux disease, unspecified whether esophagitis present   [place], [place]   1 year ago Gastroesophageal reflux disease, unspecified whether esophagitis present   [place] Megan P, DO   1 year ago Benign hypertensive renal disease   [facility] JValerie Roys DO      Future Appointments            In 1 week JWynetta Emery MBarb Merino DO [place]  In 2 months  [facility] [place]  In 4 months SBrendolyn Patty MD [place]          . DULoxetine (CYMBALTA) 20 MG capsule [Pharmacy Med Name: Duloxetine 265mDelayed-Release Capsule] 90 capsule 0    Sig: Take 1 capsule by mouth daily.     Psychiatry: Antidepressants - SNRI - duloxetine Passed - 03/22/2022  5:33 AM      Passed - Cr in normal range and within 360 days    Creatinine  Date Value Ref Range Status  06/08/2014 0.73 0.60 - 1.30 mg/dL Final   Creatinine, Ser  Date Value Ref  Range Status  10/01/2021 0.91 0.57 - 1.00 mg/dL Final         Passed - eGFR is 30 or above and within 360 days    EGFR (African American)  Date Value Ref Range Status  06/08/2014 >60 >6040min Final   GFR calc Af Amer  Date Value Ref Range Status  09/15/2020 64 >59 mL/min/1.73 Final    Comment:    **In accordance with recommendations from the NKF-ASN Task force,**   [facility] is in the process of updating its eGFR calculation to the   2021 CKD-EPI creatinine equation that estimates kidney function   without a race variable.    EGFR (Non-African Amer.)  Date Value Ref Range Status  06/08/2014 >60 >80m35mn Final    Comment:    eGFR values <80mL74m/1.73 m2 may be an indication of chronic kidney disease (CKD). Calculated eGFR, using the MRDR Study equation, is useful in  patients with stable renal function. The eGFR calculation will not be reliable in acutely ill patients when serum creatinine is changing rapidly. It is not useful in patients on dialysis. The eGFR calculation may not be applicable to patients at the low and high extremes of body sizes, pregnant  women, and vegetarians.    GFR calc non Af Amer  Date Value Ref Range Status  09/15/2020 55 (L) >59 mL/min/1.73 Final   eGFR  Date Value Ref Range Status  10/01/2021 64 >59 mL/min/1.73 Final         Passed - Completed PHQ-2 or PHQ-9 in the last 360 days      Passed - Last BP in normal range    BP Readings from Last 1 Encounters:  12/30/21 124/81         Passed - Valid encounter within last 6 months    Recent Outpatient Visits          2 months ago MDD (major depressive disorder), recurrent episode, moderate ([place])   [place], Megan P, DO   5 months ago MDD (major depressive disorder), recurrent episode, moderate ([place])   [place], Megan P, DO   12 months ago Gastroesophageal reflux disease, unspecified whether esophagitis present   [place], Megan P, DO   1 year ago Gastroesophageal reflux disease, unspecified whether esophagitis present   [place], Megan P, DO   1 year ago Benign hypertensive renal disease   [facility] [name], DO      Future Appointments            In 1 week Wynetta Emery, Barb Merino, DO [place], [place]   In 2 months  [facility], [place]   In 4 months Brendolyn Patty, MD [place]

## 2022-04-01 ENCOUNTER — Ambulatory Visit (INDEPENDENT_AMBULATORY_CARE_PROVIDER_SITE_OTHER): Payer: Medicare Other | Admitting: Family Medicine

## 2022-04-01 ENCOUNTER — Encounter: Payer: Self-pay | Admitting: Family Medicine

## 2022-04-01 VITALS — BP 126/75 | HR 80 | Temp 98.4°F | Ht 62.0 in | Wt 152.3 lb

## 2022-04-01 DIAGNOSIS — I129 Hypertensive chronic kidney disease with stage 1 through stage 4 chronic kidney disease, or unspecified chronic kidney disease: Secondary | ICD-10-CM | POA: Diagnosis not present

## 2022-04-01 DIAGNOSIS — F331 Major depressive disorder, recurrent, moderate: Secondary | ICD-10-CM | POA: Diagnosis not present

## 2022-04-01 DIAGNOSIS — M159 Polyosteoarthritis, unspecified: Secondary | ICD-10-CM | POA: Diagnosis not present

## 2022-04-01 DIAGNOSIS — K219 Gastro-esophageal reflux disease without esophagitis: Secondary | ICD-10-CM | POA: Diagnosis not present

## 2022-04-01 DIAGNOSIS — F419 Anxiety disorder, unspecified: Secondary | ICD-10-CM | POA: Diagnosis not present

## 2022-04-01 DIAGNOSIS — E782 Mixed hyperlipidemia: Secondary | ICD-10-CM

## 2022-04-01 DIAGNOSIS — E559 Vitamin D deficiency, unspecified: Secondary | ICD-10-CM | POA: Diagnosis not present

## 2022-04-01 MED ORDER — DULOXETINE HCL 40 MG PO CPEP: 40.0000 mg | ORAL_CAPSULE | Freq: Every day | ORAL | 1 refills | Status: DC

## 2022-04-01 MED ORDER — AMLODIPINE BESYLATE 2.5 MG PO TABS: 2.5000 mg | ORAL_TABLET | Freq: Every day | ORAL | 1 refills | Status: DC

## 2022-04-01 MED ORDER — LOSARTAN POTASSIUM 50 MG PO TABS: 50.0000 mg | ORAL_TABLET | Freq: Every day | ORAL | 1 refills | Status: DC

## 2022-04-01 MED ORDER — LORATADINE 10 MG PO TABS: 10.0000 mg | ORAL_TABLET | Freq: Every day | ORAL | 1 refills | Status: DC

## 2022-04-01 MED ORDER — GABAPENTIN 300 MG PO CAPS
ORAL_CAPSULE | ORAL | 1 refills | Status: DC
Start: 2022-04-01 — End: 2022-05-27

## 2022-04-01 MED ORDER — OMEPRAZOLE 20 MG PO CPDR: 20.0000 mg | DELAYED_RELEASE_CAPSULE | Freq: Every day | ORAL | 1 refills | Status: DC

## 2022-04-01 MED ORDER — ATORVASTATIN CALCIUM 40 MG PO TABS
40.0000 mg | ORAL_TABLET | Freq: Every day | ORAL | 1 refills | Status: DC
Start: 2022-04-01 — End: 2022-05-27

## 2022-04-01 NOTE — Assessment & Plan Note (Signed)
Pain significantly worse on lower dose of cymbalta. Will go back up to '40mg'$  and recheck 1 month. Call with any concerns.

## 2022-04-01 NOTE — Assessment & Plan Note (Signed)
Under good control on current regimen. Continue current regimen. Continue to monitor. Call with any concerns. Refills given. Labs draw today.

## 2022-04-01 NOTE — Assessment & Plan Note (Signed)
Under good control on current regimen. Continue current regimen. Continue to monitor. Call with any concerns. Refills given. Labs drawn today.   

## 2022-04-01 NOTE — Progress Notes (Signed)
BP 126/75   Pulse 80   Temp 98.4 F (36.9 C) (Oral)   Ht 5' 2"  (1.575 m)   Wt 152 lb 4.8 oz (69.1 kg)   LMP  (LMP Unknown)   SpO2 96%   BMI 27.86 kg/m    Subjective:    Patient ID: Cynthia Vaughan, female    DOB: 1941-05-21, 81 y.o.   MRN: [id number]  HPI: Cynthia Vaughan is a 81 y.o. female  Chief Complaint  Patient presents with   Hypertension   Hyperlipidemia   Depression   Arthritis    Patient says she has been having issues with Arthritis all over her body. Patient says she has tried an over the counter. Patient says he gives her 2 ibuprofen after breakfast.    DEPRESSION Mood status: stable Satisfied with current treatment?: yes Symptom severity: mild  Duration of current treatment : chronic Side effects: no Medication compliance: excellent compliance Psychotherapy/counseling: no  Previous psychiatric medications: cymbalta Depressed mood: no Anxious mood: no Anhedonia: no Significant weight loss or gain: no Insomnia: yes hard to fall asleep Fatigue: yes Feelings of worthlessness or guilt: yes Impaired concentration/indecisiveness: no Suicidal ideations: no Hopelessness: no Crying spells: no    04/01/2022    2:24 PM 12/30/2021    2:37 PM 10/01/2021    2:44 PM 06/18/2021   11:20 AM 03/26/2021    1:51 PM  Depression screen PHQ 2/9  Decreased Interest 0 1 2 0 0  Down, Depressed, Hopeless 1 0 2 0 0  PHQ - 2 Score 1 1 4  0 0  Altered sleeping 0 1 2  3   Tired, decreased energy 2 1 2  1   Change in appetite 0 1 2  0  Feeling bad or failure about yourself  3 0 0  0  Trouble concentrating 0 0 0  0  Moving slowly or fidgety/restless 0 0 0  0  Suicidal thoughts 0 0 0  0  PHQ-9 Score 6 4 10  4   Difficult doing work/chores Not difficult at all Not difficult at all   Not difficult at all    HYPERTENSION / [place] Satisfied with current treatment? yes Duration of hypertension: chronic BP monitoring frequency: not checking BP medication side effects:  no Past BP meds: amlodipine, losartan Duration of hyperlipidemia: chronic Cholesterol medication side effects: no Cholesterol supplements: none Past cholesterol medications: atorvastatin Medication compliance: excellent compliance Aspirin: no Recent stressors: no Recurrent headaches: no Visual changes: no Palpitations: no Dyspnea: no Chest pain: no Lower extremity edema: no Dizzy/lightheaded: no  Relevant past medical, surgical, family and social history reviewed and updated as indicated. Interim medical history since our last visit reviewed. Allergies and medications reviewed and updated.  Review of Systems  Constitutional: Negative.   Respiratory: Negative.    Cardiovascular: Negative.   Gastrointestinal: Negative.   Musculoskeletal:  Positive for arthralgias, gait problem and myalgias. Negative for back pain, joint swelling, neck pain and neck stiffness.  Skin: Negative.   Psychiatric/Behavioral:  Positive for sleep disturbance. Negative for behavioral problems, confusion, decreased concentration, dysphoric mood, hallucinations, self-injury and suicidal ideas. The patient is not nervous/anxious and is not hyperactive.     Per HPI unless specifically indicated above     Objective:    BP 126/75   Pulse 80   Temp 98.4 F (36.9 C) (Oral)   Ht 5' 2"  (1.575 m)   Wt 152 lb 4.8 oz (69.1 kg)   LMP  (LMP Unknown)   SpO2 96%  BMI 27.86 kg/m   Wt Readings from Last 3 Encounters:  04/01/22 152 lb 4.8 oz (69.1 kg)  12/30/21 134 lb 12.8 oz (61.1 kg)  10/01/21 134 lb (60.8 kg)    Physical Exam Vitals and nursing note reviewed.  Constitutional:      General: She is not in acute distress.    Appearance: Normal appearance. She is normal weight. She is not ill-appearing, toxic-appearing or diaphoretic.  HENT:     Head: Normocephalic and atraumatic.     Right Ear: External ear normal.     Left Ear: External ear normal.     Nose: Nose normal.     Mouth/Throat:     Mouth:  Mucous membranes are moist.     Pharynx: Oropharynx is clear.  Eyes:     General: No scleral icterus.       Right eye: No discharge.        Left eye: No discharge.     Extraocular Movements: Extraocular movements intact.     Conjunctiva/sclera: Conjunctivae normal.     Pupils: Pupils are equal, round, and reactive to light.  Cardiovascular:     Rate and Rhythm: Normal rate and regular rhythm.     Pulses: Normal pulses.     Heart sounds: Normal heart sounds. No murmur heard.    No friction rub. No gallop.  Pulmonary:     Effort: Pulmonary effort is normal. No respiratory distress.     Breath sounds: Normal breath sounds. No stridor. No wheezing, rhonchi or rales.  Chest:     Chest wall: No tenderness.  Musculoskeletal:        General: Normal range of motion.     Cervical back: Normal range of motion and neck supple.  Skin:    General: Skin is warm and dry.     Capillary Refill: Capillary refill takes less than 2 seconds.     Coloration: Skin is not jaundiced or pale.     Findings: No bruising, erythema, lesion or rash.  Neurological:     General: No focal deficit present.     Mental Status: She is alert and oriented to person, place, and time. Mental status is at baseline.  Psychiatric:        Mood and Affect: Mood normal.        Behavior: Behavior normal.        Thought Content: Thought content normal.        Judgment: Judgment normal.     Results for orders placed or performed in visit on 10/01/21  Microscopic Examination   Urine  Result Value Ref Range   WBC, UA None seen 0 - 5 /hpf   RBC, Urine 0-2 0 - 2 /hpf   Epithelial Cells (non renal) 0-10 0 - 10 /hpf   Mucus, UA Present (A) Not Estab.   Bacteria, UA Few (A) None seen/Few  CBC with Differential/Platelet  Result Value Ref Range   WBC 7.5 3.4 - 10.8 x10E3/uL   RBC 4.19 3.77 - 5.28 x10E6/uL   Hemoglobin 13.2 11.1 - 15.9 g/dL   Hematocrit 39.6 34.0 - 46.6 %   MCV 95 79 - 97 fL   MCH 31.5 26.6 - 33.0 pg    MCHC 33.3 31.5 - 35.7 g/dL   RDW 12.2 11.7 - 15.4 %   Platelets 163 150 - 450 x10E3/uL   Neutrophils 64 Not Estab. %   Lymphs 23 Not Estab. %   Monocytes 10 Not Estab. %  Eos 3 Not Estab. %   Basos 0 Not Estab. %   Neutrophils Absolute 4.7 1.4 - 7.0 x10E3/uL   Lymphocytes Absolute 1.7 0.7 - 3.1 x10E3/uL   Monocytes Absolute 0.8 0.1 - 0.9 x10E3/uL   EOS (ABSOLUTE) 0.2 0.0 - 0.4 x10E3/uL   Basophils Absolute 0.0 0.0 - 0.2 x10E3/uL   Immature Granulocytes 0 Not Estab. %   Immature Grans (Abs) 0.0 0.0 - 0.1 x10E3/uL  Comprehensive metabolic panel  Result Value Ref Range   Glucose 129 (H) 70 - 99 mg/dL   BUN 12 8 - 27 mg/dL   Creatinine, Ser 0.91 0.57 - 1.00 mg/dL   eGFR 64 >59 mL/min/1.73   BUN/Creatinine Ratio 13 12 - 28   Sodium 143 134 - 144 mmol/L   Potassium 4.3 3.5 - 5.2 mmol/L   Chloride 102 96 - 106 mmol/L   CO2 25 20 - 29 mmol/L   Calcium 9.0 8.7 - 10.3 mg/dL   Total Protein 6.7 6.0 - 8.5 g/dL   Albumin 4.2 3.7 - 4.7 g/dL   Globulin, Total 2.5 1.5 - 4.5 g/dL   Albumin/Globulin Ratio 1.7 1.2 - 2.2   Bilirubin Total 0.2 0.0 - 1.2 mg/dL   Alkaline Phosphatase 95 44 - 121 IU/L   AST 11 0 - 40 IU/L   ALT 6 0 - 32 IU/L  Lipid Panel w/o Chol/HDL Ratio  Result Value Ref Range   Cholesterol, Total 186 100 - 199 mg/dL   Triglycerides 119 0 - 149 mg/dL   HDL 75 >39 mg/dL   VLDL Cholesterol Cal 21 5 - 40 mg/dL   LDL Chol Calc (NIH) 90 0 - 99 mg/dL  Urinalysis, Routine w reflex microscopic  Result Value Ref Range   Specific Gravity, UA 1.025 1.005 - 1.030   pH, UA 7.0 5.0 - 7.5   Color, UA Yellow Yellow   Appearance Ur Cloudy (A) Clear   Leukocytes,UA Negative Negative   Protein,UA Negative Negative/Trace   Glucose, UA Negative Negative   Ketones, UA Negative Negative   RBC, UA Trace (A) Negative   Bilirubin, UA Negative Negative   Urobilinogen, Ur 1.0 0.2 - 1.0 mg/dL   Nitrite, UA Negative Negative   Microscopic Examination See below:   TSH  Result Value Ref Range    TSH 1.310 0.450 - 4.500 uIU/mL  Microalbumin, Urine Waived  Result Value Ref Range   Microalb, Ur Waived 30 (H) 0 - 19 mg/L   Creatinine, Urine Waived 100 10 - 300 mg/dL   Microalb/Creat Ratio <30 <30 mg/g  VITAMIN D 25 Hydroxy (Vit-D Deficiency, Fractures)  Result Value Ref Range   Vit D, 25-Hydroxy 35.7 30.0 - 100.0 ng/mL      Assessment & Plan:   Problem List Items Addressed This Visit       Digestive   GERD (gastroesophageal reflux disease)    Under good control on current regimen. Continue current regimen. Continue to monitor. Call with any concerns. Refills given. Labs drawn today.       Relevant Medications   omeprazole (PRILOSEC) 20 MG capsule   Other Relevant Orders   Comprehensive metabolic panel   CBC with Differential/Platelet     Musculoskeletal and Integument   Osteoarthritis    Pain significantly worse on lower dose of cymbalta. Will go back up to 73m and recheck 1 month. Call with any concerns.         Genitourinary   Benign hypertensive renal disease - Primary    Under  good control on current regimen. Continue current regimen. Continue to monitor. Call with any concerns. Refills given. Labs drawn today.        Relevant Orders   Comprehensive metabolic panel   CBC with Differential/Platelet   Microalbumin, Urine Waived   TSH   Urinalysis, Routine w reflex microscopic     Other   Anxiety   Relevant Medications   DULoxetine 40 MG CPEP   Other Relevant Orders   Comprehensive metabolic panel   CBC with Differential/Platelet   Hyperlipidemia    Under good control on current regimen. Continue current regimen. Continue to monitor. Call with any concerns. Refills given. Labs draw today.       Relevant Medications   losartan (COZAAR) 50 MG tablet   atorvastatin (LIPITOR) 40 MG tablet   amLODipine (NORVASC) 2.5 MG tablet   Other Relevant Orders   Comprehensive metabolic panel   CBC with Differential/Platelet   Lipid Panel w/o Chol/HDL Ratio    Vitamin D deficiency disease    Rechecking labs today. Await results.       Relevant Orders   Comprehensive metabolic panel   CBC with Differential/Platelet   VITAMIN D 25 Hydroxy (Vit-D Deficiency, Fractures)   MDD (major depressive disorder), recurrent episode, moderate (HCC)    Under good control on current regimen. Will increase cymbalta due to pain. Continue to monitor. Call with any concerns. Refills given.        Relevant Medications   DULoxetine 40 MG CPEP   Other Relevant Orders   Comprehensive metabolic panel   CBC with Differential/Platelet     Follow up plan: Return in about 4 weeks (around 04/29/2022).

## 2022-04-01 NOTE — Assessment & Plan Note (Signed)
Under good control on current regimen. Will increase cymbalta due to pain. Continue to monitor. Call with any concerns. Refills given.

## 2022-04-01 NOTE — Assessment & Plan Note (Signed)
Rechecking labs today. Await results.  

## 2022-04-02 LAB — TSH: TSH: 3.77 u[IU]/mL (ref 0.450–4.500)

## 2022-04-02 LAB — COMPREHENSIVE METABOLIC PANEL
ALT: 24 IU/L (ref 0–32)
AST: 18 IU/L (ref 0–40)
Albumin/Globulin Ratio: 1.6 (ref 1.2–2.2)
Albumin: 4 g/dL (ref 3.8–4.8)
Alkaline Phosphatase: 67 IU/L (ref 44–121)
BUN/Creatinine Ratio: 21 (ref 12–28)
BUN: 19 mg/dL (ref 8–27)
Bilirubin Total: 0.2 mg/dL (ref 0.0–1.2)
CO2: 22 mmol/L (ref 20–29)
Calcium: 9.1 mg/dL (ref 8.7–10.3)
Chloride: 103 mmol/L (ref 96–106)
Creatinine, Ser: 0.91 mg/dL (ref 0.57–1.00)
Globulin, Total: 2.5 g/dL (ref 1.5–4.5)
Glucose: 137 mg/dL — ABNORMAL HIGH (ref 70–99)
Potassium: 4.5 mmol/L (ref 3.5–5.2)
Sodium: 141 mmol/L (ref 134–144)
Total Protein: 6.5 g/dL (ref 6.0–8.5)
eGFR: 64 mL/min/{1.73_m2} (ref >59)

## 2022-04-02 LAB — CBC WITH DIFFERENTIAL/PLATELET
Basophils Absolute: 0 10*3/uL (ref 0.0–0.2)
Basos: 0 %
EOS (ABSOLUTE): 0.3 10*3/uL (ref 0.0–0.4)
Eos: 4 %
Hematocrit: 38.2 % (ref 34.0–46.6)
Hemoglobin: 12.6 g/dL (ref 11.1–15.9)
Immature Grans (Abs): 0 10*3/uL (ref 0.0–0.1)
Immature Granulocytes: 1 %
Lymphocytes Absolute: 2 10*3/uL (ref 0.7–3.1)
Lymphs: 28 %
MCH: 31.3 pg (ref 26.6–33.0)
MCHC: 33 g/dL (ref 31.5–35.7)
MCV: 95 fL (ref 79–97)
Monocytes Absolute: 0.6 10*3/uL (ref 0.1–0.9)
Monocytes: 9 %
Neutrophils Absolute: 4.1 10*3/uL (ref 1.4–7.0)
Neutrophils: 58 %
Platelets: 209 10*3/uL (ref 150–450)
RBC: 4.03 x10E6/uL (ref 3.77–5.28)
RDW: 13.3 % (ref 11.7–15.4)
WBC: 7 10*3/uL (ref 3.4–10.8)

## 2022-04-02 LAB — VITAMIN D 25 HYDROXY (VIT D DEFICIENCY, FRACTURES): Vit D, 25-Hydroxy: 30.4 ng/mL (ref 30.0–100.0)

## 2022-04-02 LAB — LIPID PANEL W/O CHOL/HDL RATIO
Cholesterol, Total: 192 mg/dL (ref 100–199)
HDL: 73 mg/dL (ref >39)
LDL Chol Calc (NIH): 100 mg/dL — ABNORMAL HIGH (ref 0–99)
Triglycerides: 108 mg/dL (ref 0–149)
VLDL Cholesterol Cal: 19 mg/dL (ref 5–40)

## 2022-04-12 ENCOUNTER — Other Ambulatory Visit: Payer: Self-pay | Admitting: Family Medicine

## 2022-04-12 DIAGNOSIS — F331 Major depressive disorder, recurrent, moderate: Secondary | ICD-10-CM

## 2022-04-13 ENCOUNTER — Ambulatory Visit: Payer: Self-pay | Admitting: *Deleted

## 2022-04-13 NOTE — Telephone Encounter (Signed)
  Chief Complaint: Med Clarification. Symptoms: NA Frequency: NA Pertinent Negatives: Patient denies NA Disposition: '[]'$ ED /'[]'$ Urgent Care (no appt availability in office) / '[]'$ Appointment(In office/virtual)/ '[]'$  [facility] [facility]/ '[]'$ Home Care/ '[]'$ Refused Recommended Disposition /'[]'$ [place] [facility]/ '[]'$  Follow-up with PCP Additional Notes: [facility] calling for clarification on Gabapentin and Duloxetine orders. Reviewed orders from medication profile orders and office note. States will process as ordered. Reason for Disposition  Pharmacy calling with prescription question and triager answers question  Answer Assessment - Initial Assessment Questions 1. NAME of MEDICINE: "What medicine(s) are you calling about?"     Gabapentin and duloxetine. 2. QUESTION: "What is your question?" (e.g., double dose of medicine, side effect)     Clarification. 3. PRESCRIBER: "Who prescribed the medicine?" Reason: if prescribed by specialist, call should be referred to that group.     PCP  Protocols used: Medication Question Call-A-AH

## 2022-04-13 NOTE — Telephone Encounter (Signed)
Requested medication (s) are due for refill today: na  Requested medication (s) are on the active medication list: yes  Last refill:  04/01/22 #90 1 refill  Future visit scheduled: yes in 2 weeks  Notes to clinic:  Pharmacy comment: Does this medication replace DULOXETINE 20 MG DR CAP, TRAZODONE 50 MG TAB? Please provide guidance     Requested Prescriptions  Pending Prescriptions Disp Refills   DULoxetine HCl 40 MG CPEP [Pharmacy Med Name: Duloxetine 70m Delayed-Release Capsule] 90 capsule 1    Sig: Take 1 capsule by mouth daily.     Psychiatry: Antidepressants - SNRI - duloxetine Passed - 04/12/2022  5:55 PM      Passed - Cr in normal range and within 360 days    Creatinine  Date Value Ref Range Status  06/08/2014 0.73 0.60 - 1.30 mg/dL Final   Creatinine, Ser  Date Value Ref Range Status  04/01/2022 0.91 0.57 - 1.00 mg/dL Final         Passed - eGFR is 30 or above and within 360 days    EGFR (African American)  Date Value Ref Range Status  06/08/2014 >60 >647mmin Final   GFR calc Af Amer  Date Value Ref Range Status  09/15/2020 64 >59 mL/min/1.73 Final    Comment:    **In accordance with recommendations from the NKF-ASN Task force,**   [facility] is in the process of updating its eGFR calculation to the   2021 CKD-EPI creatinine equation that estimates kidney function   without a race variable.    EGFR (Non-African Amer.)  Date Value Ref Range Status  06/08/2014 >60 >6045min Final    Comment:    eGFR values <29m14mn/1.73 m2 may be an indication of chronic kidney disease (CKD). Calculated eGFR, using the MRDR Study equation, is useful in  patients with stable renal function. The eGFR calculation will not be reliable in acutely ill patients when serum creatinine is changing rapidly. It is not useful in patients on dialysis. The eGFR calculation may not be applicable to patients at the low and high extremes of body sizes, pregnant women, and vegetarians.     GFR calc non Af Amer  Date Value Ref Range Status  09/15/2020 55 (L) >59 mL/min/1.73 Final   eGFR  Date Value Ref Range Status  04/01/2022 64 >59 mL/min/1.73 Final         Passed - Completed PHQ-2 or PHQ-9 in the last 360 days      Passed - Last BP in normal range    BP Readings from Last 1 Encounters:  04/01/22 126/75         Passed - Valid encounter within last 6 months    Recent Outpatient Visits           1 week ago Benign hypertensive renal disease   [facility] Johnson, Megan P, DO   3 months ago MDD (major depressive disorder), recurrent episode, moderate ([facility]   6 months ago MDD (major depressive disorder), recurrent episode, moderate ([facility]   1 year ago Gastroesophageal reflux disease, unspecified whether esophagitis present   [place] [facility], [place]   1 year ago Gastroesophageal reflux disease, unspecified whether esophagitis present   [place]       Future Appointments             In 2  weeks [name], DO [facility], [facility]   In 2 months  [employer], [place]   In 3 months Brendolyn Patty, MD [place]

## 2022-04-29 ENCOUNTER — Ambulatory Visit (INDEPENDENT_AMBULATORY_CARE_PROVIDER_SITE_OTHER): Payer: Medicare Other | Admitting: Family Medicine

## 2022-04-29 ENCOUNTER — Encounter: Payer: Self-pay | Admitting: Family Medicine

## 2022-04-29 DIAGNOSIS — F419 Anxiety disorder, unspecified: Secondary | ICD-10-CM

## 2022-04-29 DIAGNOSIS — F331 Major depressive disorder, recurrent, moderate: Secondary | ICD-10-CM

## 2022-04-29 DIAGNOSIS — M159 Polyosteoarthritis, unspecified: Secondary | ICD-10-CM | POA: Diagnosis not present

## 2022-04-29 MED ORDER — DULOXETINE HCL 20 MG PO CPEP: ORAL_CAPSULE | ORAL | 3 refills | Status: DC

## 2022-04-29 NOTE — Assessment & Plan Note (Signed)
Has been out of her cymbalta. Anxiety is worse. Called to pill pack to find out what's going on. Rx sent to local pharmacy- will titrate back up to '40mg'$ . Recheck 1 month.

## 2022-04-29 NOTE — Progress Notes (Signed)
BP 133/85   Pulse 72   Temp 97.9 F (36.6 C)   Wt 159 lb 12.8 oz (72.5 kg)   LMP  (LMP Unknown)   SpO2 98%   BMI 29.23 kg/m    Subjective:    Patient ID: Cynthia Vaughan, female    DOB: [birth year], 81 y.o.   MRN: [id number]  HPI: Cynthia Vaughan is a 81 y.o. female  Chief Complaint  Patient presents with   Osteoarthritis    Patient states she is still in a lot of pain, not sure if she is on the cymbalta.    Has been having more pain and more anxiety. Stopped her cymbalta because it wasn't in her pill back last time. Not sure why it wasn't. Not feeling well. Notes that her muscles and joints are hurting more. Her mood has been more irritable and she has been having more anxiety. She is not feeling well.  Relevant past medical, surgical, family and social history reviewed and updated as indicated. Interim medical history since our last visit reviewed. Allergies and medications reviewed and updated.  Review of Systems  Constitutional: Negative.   Respiratory: Negative.    Cardiovascular: Negative.   Gastrointestinal: Negative.   Musculoskeletal:  Positive for arthralgias, back pain and myalgias. Negative for gait problem, joint swelling, neck pain and neck stiffness.  Skin: Negative.   Neurological: Negative.   Psychiatric/Behavioral:  Positive for agitation and dysphoric mood. Negative for behavioral problems, confusion, decreased concentration, hallucinations, self-injury, sleep disturbance and suicidal ideas. The patient is nervous/anxious. The patient is not hyperactive.     Per HPI unless specifically indicated above     Objective:    BP 133/85   Pulse 72   Temp 97.9 F (36.6 C)   Wt 159 lb 12.8 oz (72.5 kg)   LMP  (LMP Unknown)   SpO2 98%   BMI 29.23 kg/m   Wt Readings from Last 3 Encounters:  04/29/22 159 lb 12.8 oz (72.5 kg)  04/01/22 152 lb 4.8 oz (69.1 kg)  12/30/21 134 lb 12.8 oz (61.1 kg)    Physical Exam Vitals and nursing note reviewed.   Constitutional:      General: She is not in acute distress.    Appearance: Normal appearance. She is normal weight. She is not ill-appearing, toxic-appearing or diaphoretic.  HENT:     Head: Normocephalic and atraumatic.     Right Ear: External ear normal.     Left Ear: External ear normal.     Nose: Nose normal.     Mouth/Throat:     Mouth: Mucous membranes are moist.     Pharynx: Oropharynx is clear.  Eyes:     General: No scleral icterus.       Right eye: No discharge.        Left eye: No discharge.     Extraocular Movements: Extraocular movements intact.     Conjunctiva/sclera: Conjunctivae normal.     Pupils: Pupils are equal, round, and reactive to light.  Cardiovascular:     Rate and Rhythm: Normal rate and regular rhythm.     Pulses: Normal pulses.     Heart sounds: Normal heart sounds. No murmur heard.    No friction rub. No gallop.  Pulmonary:     Effort: Pulmonary effort is normal. No respiratory distress.     Breath sounds: Normal breath sounds. No stridor. No wheezing, rhonchi or rales.  Chest:     Chest wall: No tenderness.  Musculoskeletal:  General: Normal range of motion.     Cervical back: Normal range of motion and neck supple.  Skin:    General: Skin is warm and dry.     Capillary Refill: Capillary refill takes less than 2 seconds.     Coloration: Skin is not jaundiced or pale.     Findings: No bruising, erythema, lesion or rash.  Neurological:     General: No focal deficit present.     Mental Status: She is alert and oriented to person, place, and time. Mental status is at baseline.  Psychiatric:        Mood and Affect: Mood normal.        Behavior: Behavior normal.        Thought Content: Thought content normal.        Judgment: Judgment normal.     Results for orders placed or performed in visit on 04/01/22  Comprehensive metabolic panel  Result Value Ref Range   Glucose 137 (H) 70 - 99 mg/dL   BUN 19 8 - 27 mg/dL   Creatinine, Ser  0.91 0.57 - 1.00 mg/dL   eGFR 64 >59 mL/min/1.73   BUN/Creatinine Ratio 21 12 - 28   Sodium 141 134 - 144 mmol/L   Potassium 4.5 3.5 - 5.2 mmol/L   Chloride 103 96 - 106 mmol/L   CO2 22 20 - 29 mmol/L   Calcium 9.1 8.7 - 10.3 mg/dL   Total Protein 6.5 6.0 - 8.5 g/dL   Albumin 4.0 3.8 - 4.8 g/dL   Globulin, Total 2.5 1.5 - 4.5 g/dL   Albumin/Globulin Ratio 1.6 1.2 - 2.2   Bilirubin Total 0.2 0.0 - 1.2 mg/dL   Alkaline Phosphatase 67 44 - 121 IU/L   AST 18 0 - 40 IU/L   ALT 24 0 - 32 IU/L  CBC with Differential/Platelet  Result Value Ref Range   WBC 7.0 3.4 - 10.8 x10E3/uL   RBC 4.03 3.77 - 5.28 x10E6/uL   Hemoglobin 12.6 11.1 - 15.9 g/dL   Hematocrit 38.2 34.0 - 46.6 %   MCV 95 79 - 97 fL   MCH 31.3 26.6 - 33.0 pg   MCHC 33.0 31.5 - 35.7 g/dL   RDW 13.3 11.7 - 15.4 %   Platelets 209 150 - 450 x10E3/uL   Neutrophils 58 Not Estab. %   Lymphs 28 Not Estab. %   Monocytes 9 Not Estab. %   Eos 4 Not Estab. %   Basos 0 Not Estab. %   Neutrophils Absolute 4.1 1.4 - 7.0 x10E3/uL   Lymphocytes Absolute 2.0 0.7 - 3.1 x10E3/uL   Monocytes Absolute 0.6 0.1 - 0.9 x10E3/uL   EOS (ABSOLUTE) 0.3 0.0 - 0.4 x10E3/uL   Basophils Absolute 0.0 0.0 - 0.2 x10E3/uL   Immature Granulocytes 1 Not Estab. %   Immature Grans (Abs) 0.0 0.0 - 0.1 x10E3/uL  Lipid Panel w/o Chol/HDL Ratio  Result Value Ref Range   Cholesterol, Total 192 100 - 199 mg/dL   Triglycerides 108 0 - 149 mg/dL   HDL 73 >39 mg/dL   VLDL Cholesterol Cal 19 5 - 40 mg/dL   LDL Chol Calc (NIH) 100 (H) 0 - 99 mg/dL  TSH  Result Value Ref Range   TSH 3.770 0.450 - 4.500 uIU/mL  VITAMIN D 25 Hydroxy (Vit-D Deficiency, Fractures)  Result Value Ref Range   Vit D, 25-Hydroxy 30.4 30.0 - 100.0 ng/mL      Assessment & Plan:   Problem List Items  Addressed This Visit       Musculoskeletal and Integument   Osteoarthritis    Has been out of her cymbalta. Pain is worse. Called to pill pack to find out what's going on. Rx sent to  local pharmacy- will titrate back up to 57m. Recheck 1 month.         Other   Anxiety    Has been out of her cymbalta. Anxiety is worse. Called to pill pack to find out what's going on. Rx sent to local pharmacy- will titrate back up to 476m Recheck 1 month.       Relevant Medications   DULoxetine (CYMBALTA) 20 MG capsule   MDD (major depressive disorder), recurrent episode, moderate [place]   Has been out of her cymbalta. Anxiety is worse. Called to pill pack to find out what's going on. Rx sent to local pharmacy- will titrate back up to 4089mRecheck 1 month.       Relevant Medications   DULoxetine (CYMBALTA) 20 MG capsule     Follow up plan: Return in about 4 weeks (around 05/27/2022).

## 2022-04-29 NOTE — Assessment & Plan Note (Signed)
Has been out of her cymbalta. Pain is worse. Called to pill pack to find out what's going on. Rx sent to local pharmacy- will titrate back up to '40mg'$ . Recheck 1 month.

## 2022-05-27 ENCOUNTER — Encounter: Payer: Self-pay | Admitting: Family Medicine

## 2022-05-27 ENCOUNTER — Ambulatory Visit (INDEPENDENT_AMBULATORY_CARE_PROVIDER_SITE_OTHER): Payer: Medicare Other | Admitting: Family Medicine

## 2022-05-27 VITALS — BP 126/83 | HR 83 | Temp 98.6°F | Wt 164.6 lb

## 2022-05-27 DIAGNOSIS — F331 Major depressive disorder, recurrent, moderate: Secondary | ICD-10-CM

## 2022-05-27 DIAGNOSIS — M545 Low back pain, unspecified: Secondary | ICD-10-CM | POA: Diagnosis not present

## 2022-05-27 DIAGNOSIS — R52 Pain, unspecified: Secondary | ICD-10-CM | POA: Diagnosis not present

## 2022-05-27 MED ORDER — KETOROLAC TROMETHAMINE 60 MG/2ML IM SOLN
60.0000 mg | Freq: Once | INTRAMUSCULAR | Status: AC
  Administered 2022-05-27: 60 mg via INTRAMUSCULAR

## 2022-05-27 MED ORDER — GABAPENTIN 300 MG PO CAPS: 600.0000 mg | ORAL_CAPSULE | Freq: Two times a day (BID) | ORAL | 1 refills | Status: DC

## 2022-05-27 NOTE — Progress Notes (Signed)
BP 126/83   Pulse 83   Temp 98.6 F (37 C)   Wt 164 lb 9.6 oz (74.7 kg)   LMP  (LMP Unknown)   SpO2 95%   BMI 30.11 kg/m    Subjective:    Patient ID: Cynthia Vaughan, female    DOB: 04/10/1941, 81 y.o.   MRN: [id number]  HPI: Cynthia Vaughan is a 81 y.o. female  Chief Complaint  Patient presents with   Anxiety    Patient husband states he doesn't feel like its helping, patient is still very anxious and in pain.    Depression   Osteoarthritis    Patient states her pain is getting worse, sometimes is not able to straighten up due to her back hurting so much.    Has been having widespread pain. Has been crying a lot and not feeling like herself for at least a month.  DEPRESSION Mood status: exacerbated Satisfied with current treatment?: no Symptom severity: moderate  Duration of current treatment : months Side effects: no Medication compliance: good compliance Psychotherapy/counseling: no  Previous psychiatric medications: cymbalta, wellbutrin Depressed mood: yes Anxious mood: yes Anhedonia: no Significant weight loss or gain: no Insomnia: yes  Fatigue: yes Feelings of worthlessness or guilt: yes Impaired concentration/indecisiveness: no Suicidal ideations: no Hopelessness: no Crying spells: yes    05/27/2022    2:40 PM 04/01/2022    2:24 PM 12/30/2021    2:37 PM 10/01/2021    2:44 PM 06/18/2021   11:20 AM  Depression screen PHQ 2/9  Decreased Interest 2 0 1 2 0  Down, Depressed, Hopeless 1 1 0 2 0  PHQ - 2 Score 3 1 1 4  0  Altered sleeping 1 0 1 2   Tired, decreased energy 2 2 1 2    Change in appetite 2 0 1 2   Feeling bad or failure about yourself  1 3 0 0   Trouble concentrating 1 0 0 0   Moving slowly or fidgety/restless 0 0 0 0   Suicidal thoughts 0 0 0 0   PHQ-9 Score 10 6 4 10    Difficult doing work/chores Somewhat difficult Not difficult at all Not difficult at all       Relevant past medical, surgical, family and social history reviewed and  updated as indicated. Interim medical history since our last visit reviewed. Allergies and medications reviewed and updated.  Review of Systems  Constitutional:  Positive for fatigue. Negative for activity change, appetite change, chills, diaphoresis, fever and unexpected weight change.  HENT: Negative.    Respiratory: Negative.    Cardiovascular: Negative.   Gastrointestinal: Negative.   Musculoskeletal:  Positive for myalgias. Negative for arthralgias, back pain, gait problem, joint swelling, neck pain and neck stiffness.  Skin: Negative.   Neurological: Negative.   Psychiatric/Behavioral:  Positive for confusion and dysphoric mood. Negative for agitation, behavioral problems, decreased concentration, hallucinations, self-injury, sleep disturbance and suicidal ideas. The patient is nervous/anxious. The patient is not hyperactive.     Per HPI unless specifically indicated above     Objective:    BP 126/83   Pulse 83   Temp 98.6 F (37 C)   Wt 164 lb 9.6 oz (74.7 kg)   LMP  (LMP Unknown)   SpO2 95%   BMI 30.11 kg/m   Wt Readings from Last 3 Encounters:  05/27/22 164 lb 9.6 oz (74.7 kg)  04/29/22 159 lb 12.8 oz (72.5 kg)  04/01/22 152 lb 4.8 oz (69.1 kg)  Physical Exam Vitals and nursing note reviewed.  Constitutional:      General: She is not in acute distress.    Appearance: Normal appearance. She is not ill-appearing, toxic-appearing or diaphoretic.  HENT:     Head: Normocephalic and atraumatic.     Right Ear: External ear normal.     Left Ear: External ear normal.     Nose: Nose normal.     Mouth/Throat:     Mouth: Mucous membranes are moist.     Pharynx: Oropharynx is clear.  Eyes:     General: No scleral icterus.       Right eye: No discharge.        Left eye: No discharge.     Extraocular Movements: Extraocular movements intact.     Conjunctiva/sclera: Conjunctivae normal.     Pupils: Pupils are equal, round, and reactive to light.  Cardiovascular:      Rate and Rhythm: Normal rate and regular rhythm.     Pulses: Normal pulses.     Heart sounds: Normal heart sounds. No murmur heard.    No friction rub. No gallop.  Pulmonary:     Effort: Pulmonary effort is normal. No respiratory distress.     Breath sounds: Normal breath sounds. No stridor. No wheezing, rhonchi or rales.  Chest:     Chest wall: No tenderness.  Musculoskeletal:        General: Normal range of motion.     Cervical back: Normal range of motion and neck supple.  Skin:    General: Skin is warm and dry.     Capillary Refill: Capillary refill takes less than 2 seconds.     Coloration: Skin is not jaundiced or pale.     Findings: No bruising, erythema, lesion or rash.  Neurological:     General: No focal deficit present.     Mental Status: She is alert and oriented to person, place, and time. Mental status is at baseline.  Psychiatric:        Mood and Affect: Mood normal.        Behavior: Behavior normal.        Thought Content: Thought content normal.        Judgment: Judgment normal.     Results for orders placed or performed in visit on 05/27/22  Comprehensive metabolic panel  Result Value Ref Range   Glucose 102 (H) 70 - 99 mg/dL   BUN 17 8 - 27 mg/dL   Creatinine, Ser 1.03 (H) 0.57 - 1.00 mg/dL   eGFR 55 (L) >59 mL/min/1.73   BUN/Creatinine Ratio 17 12 - 28   Sodium 141 134 - 144 mmol/L   Potassium 4.3 3.5 - 5.2 mmol/L   Chloride 102 96 - 106 mmol/L   CO2 24 20 - 29 mmol/L   Calcium 9.5 8.7 - 10.3 mg/dL   Total Protein 6.7 6.0 - 8.5 g/dL   Albumin 4.0 3.8 - 4.8 g/dL   Globulin, Total 2.7 1.5 - 4.5 g/dL   Albumin/Globulin Ratio 1.5 1.2 - 2.2   Bilirubin Total <0.2 0.0 - 1.2 mg/dL   Alkaline Phosphatase 73 44 - 121 IU/L   AST 20 0 - 40 IU/L   ALT 21 0 - 32 IU/L  CBC with Differential/Platelet  Result Value Ref Range   WBC 7.5 3.4 - 10.8 x10E3/uL   RBC 4.12 3.77 - 5.28 x10E6/uL   Hemoglobin 13.1 11.1 - 15.9 g/dL   Hematocrit 38.1 34.0 - 46.6 %  MCV  93 79 - 97 fL   MCH 31.8 26.6 - 33.0 pg   MCHC 34.4 31.5 - 35.7 g/dL   RDW 12.4 11.7 - 15.4 %   Platelets 231 150 - 450 x10E3/uL   Neutrophils 52 Not Estab. %   Lymphs 31 Not Estab. %   Monocytes 11 Not Estab. %   Eos 5 Not Estab. %   Basos 0 Not Estab. %   Neutrophils Absolute 3.9 1.4 - 7.0 x10E3/uL   Lymphocytes Absolute 2.3 0.7 - 3.1 x10E3/uL   Monocytes Absolute 0.8 0.1 - 0.9 x10E3/uL   EOS (ABSOLUTE) 0.4 0.0 - 0.4 x10E3/uL   Basophils Absolute 0.0 0.0 - 0.2 x10E3/uL   Immature Granulocytes 1 Not Estab. %   Immature Grans (Abs) 0.0 0.0 - 0.1 x10E3/uL  TSH  Result Value Ref Range   TSH 4.040 0.450 - 4.500 uIU/mL  Lyme Disease Serology w/Reflex  Result Value Ref Range   Lyme Total Antibody EIA Negative Negative  Rocky mtn spotted fvr abs pnl(IgG+IgM)  Result Value Ref Range   RMSF IgG Negative Negative   RMSF IgM 0.26 0.00 - 9.73 index  Ehrlichia Antibody Panel  Result Value Ref Range   E.Chaffeensis (HME) IgG Negative Neg:<1:64   E. Chaffeensis (HME) IgM Titer Negative Neg:<1:20   HGE IgG Titer Negative Neg:<1:64   HGE IgM Titer Negative Neg:<1:20   Result Comment: Comment   Babesia microti Antibody Panel  Result Value Ref Range   Babesia microti IgG <1:10 Neg:<1:10   Babesia microti IgM <1:10 Neg:<1:10   Result Comment: Comment       Assessment & Plan:   Problem List Items Addressed This Visit       Other   MDD (major depressive disorder), recurrent episode, moderate ([place])    Not doing well at all. Only back on cymbalta for about 4 weeks. Will give it a bit more time and recheck 2 weeks. Call with any concerns.       Other Visit Diagnoses     Total body pain    -  Primary   Better with toradol. Will check for tick diseases and increase gabapentin. Stop atorvastatin. Continue to monitor. Recheck 2 weeks.    Relevant Orders   Comprehensive metabolic panel (Completed)   TSH (Completed)   Lyme Disease Serology w/Reflex (Completed)   Rocky mtn spotted fvr  abs pnl(IgG+IgM) (Completed)   Ehrlichia Antibody Panel (Completed)   Babesia microti Antibody Panel (Completed)   Urinalysis, Routine w reflex microscopic   Acute bilateral low back pain without sciatica       Relevant Medications   ketorolac (TORADOL) injection 60 mg (Completed)   Other Relevant Orders   CBC with Differential/Platelet (Completed)   Urinalysis, Routine w reflex microscopic        Follow up plan: Return in about 2 weeks (around 06/10/2022).

## 2022-05-27 NOTE — Assessment & Plan Note (Signed)
Not doing well at all. Only back on cymbalta for about 4 weeks. Will give it a bit more time and recheck 2 weeks. Call with any concerns.

## 2022-05-30 ENCOUNTER — Telehealth: Payer: Self-pay

## 2022-05-30 MED ORDER — DULOXETINE HCL 20 MG PO CPEP: 40.0000 mg | ORAL_CAPSULE | Freq: Every day | ORAL | 1 refills | Status: DC

## 2022-05-30 NOTE — Telephone Encounter (Signed)
Per Pill Pack, cymbalta '40mg'$  is not covered by insurance but Cymbalta '20MG'$  capsules is with a $20 copay. If '20MG'$  is appropriate they will need a new prescription sent.

## 2022-05-30 NOTE — Telephone Encounter (Signed)
-----   Message from [name], DO sent at 05/27/2022  2:54 PM EDT ----- Can we check with pill pack why they won't cover the cymbalta

## 2022-06-02 LAB — CBC WITH DIFFERENTIAL/PLATELET
Basophils Absolute: 0 10*3/uL (ref 0.0–0.2)
Basos: 0 %
EOS (ABSOLUTE): 0.4 10*3/uL (ref 0.0–0.4)
Eos: 5 %
Hematocrit: 38.1 % (ref 34.0–46.6)
Hemoglobin: 13.1 g/dL (ref 11.1–15.9)
Immature Grans (Abs): 0 10*3/uL (ref 0.0–0.1)
Immature Granulocytes: 1 %
Lymphocytes Absolute: 2.3 10*3/uL (ref 0.7–3.1)
Lymphs: 31 %
MCH: 31.8 pg (ref 26.6–33.0)
MCHC: 34.4 g/dL (ref 31.5–35.7)
MCV: 93 fL (ref 79–97)
Monocytes Absolute: 0.8 10*3/uL (ref 0.1–0.9)
Monocytes: 11 %
Neutrophils Absolute: 3.9 10*3/uL (ref 1.4–7.0)
Neutrophils: 52 %
Platelets: 231 10*3/uL (ref 150–450)
RBC: 4.12 x10E6/uL (ref 3.77–5.28)
RDW: 12.4 % (ref 11.7–15.4)
WBC: 7.5 10*3/uL (ref 3.4–10.8)

## 2022-06-02 LAB — BABESIA MICROTI ANTIBODY PANEL
Babesia microti IgG: <1:10 {titer}
Babesia microti IgM: <1:10 {titer}

## 2022-06-02 LAB — EHRLICHIA ANTIBODY PANEL
E. Chaffeensis (HME) IgM Titer: NEGATIVE
E.Chaffeensis (HME) IgG: NEGATIVE
HGE IgG Titer: NEGATIVE
HGE IgM Titer: NEGATIVE

## 2022-06-02 LAB — COMPREHENSIVE METABOLIC PANEL
ALT: 21 IU/L (ref 0–32)
AST: 20 IU/L (ref 0–40)
Albumin/Globulin Ratio: 1.5 (ref 1.2–2.2)
Albumin: 4 g/dL (ref 3.8–4.8)
Alkaline Phosphatase: 73 IU/L (ref 44–121)
BUN/Creatinine Ratio: 17 (ref 12–28)
BUN: 17 mg/dL (ref 8–27)
Bilirubin Total: <0.2 mg/dL (ref 0.0–1.2)
CO2: 24 mmol/L (ref 20–29)
Calcium: 9.5 mg/dL (ref 8.7–10.3)
Chloride: 102 mmol/L (ref 96–106)
Creatinine, Ser: 1.03 mg/dL — ABNORMAL HIGH (ref 0.57–1.00)
Globulin, Total: 2.7 g/dL (ref 1.5–4.5)
Glucose: 102 mg/dL — ABNORMAL HIGH (ref 70–99)
Potassium: 4.3 mmol/L (ref 3.5–5.2)
Sodium: 141 mmol/L (ref 134–144)
Total Protein: 6.7 g/dL (ref 6.0–8.5)
eGFR: 55 mL/min/{1.73_m2} — ABNORMAL LOW (ref >59)

## 2022-06-02 LAB — ROCKY MTN SPOTTED FVR ABS PNL(IGG+IGM)
RMSF IgG: NEGATIVE
RMSF IgM: 0.26 index (ref 0.00–0.89)

## 2022-06-02 LAB — LYME DISEASE SEROLOGY W/REFLEX: Lyme Total Antibody EIA: NEGATIVE

## 2022-06-02 LAB — TSH: TSH: 4.04 u[IU]/mL (ref 0.450–4.500)

## 2022-06-05 ENCOUNTER — Encounter: Payer: Self-pay | Admitting: Family Medicine

## 2022-06-10 ENCOUNTER — Encounter: Payer: Self-pay | Admitting: Family Medicine

## 2022-06-10 ENCOUNTER — Ambulatory Visit (INDEPENDENT_AMBULATORY_CARE_PROVIDER_SITE_OTHER): Payer: Medicare Other | Admitting: Family Medicine

## 2022-06-10 VITALS — BP 122/81 | HR 86 | Temp 98.2°F | Wt 167.2 lb

## 2022-06-10 DIAGNOSIS — R52 Pain, unspecified: Secondary | ICD-10-CM | POA: Diagnosis not present

## 2022-06-10 DIAGNOSIS — F331 Major depressive disorder, recurrent, moderate: Secondary | ICD-10-CM

## 2022-06-10 MED ORDER — DULOXETINE HCL 20 MG PO CPEP: ORAL_CAPSULE | ORAL | 1 refills | Status: DC

## 2022-06-10 NOTE — Progress Notes (Signed)
BP 122/81   Pulse 86   Temp 98.2 F (36.8 C)   Wt 167 lb 3.2 oz (75.8 kg)   LMP  (LMP Unknown)   SpO2 95%   BMI 30.58 kg/m    Subjective:    Patient ID: Cynthia Vaughan, female    DOB: [birth year], 81 y.o.   MRN: [id number]  HPI: Cynthia Vaughan is a 81 y.o. female  Chief Complaint  Patient presents with   Depression    Patient here to follow up on cymbalta, states she has been off of cymbalta for a few weeks because the pharmacy wouldn't cover it.    Total body pain    Patient states her pain comes and goes, was feeling better until Monday she started to hurt again. Patient states she fell on Monday but did not hurt herself. Patient states she did not receive her new order yet so has been only taking Gabapentin 300MG three times a day.    DEPRESSION- has not been on her cymbalta in at least 2 weeks. She is having whole body pain. She is not feeling well at all Mood status: exacerbated Satisfied with current treatment?: no Symptom severity: moderate  Duration of current treatment : chronic Side effects: no Medication compliance: poor compliance Psychotherapy/counseling: no  Previous psychiatric medications: cymbalta and gabapentin Depressed mood: yes Anxious mood: yes Anhedonia: yes Significant weight loss or gain: no Insomnia: no  Fatigue: yes Feelings of worthlessness or guilt: yes Impaired concentration/indecisiveness: no Suicidal ideations: no Hopelessness: no Crying spells: no    06/10/2022    2:30 PM 05/27/2022    2:40 PM 04/01/2022    2:24 PM 12/30/2021    2:37 PM 10/01/2021    2:44 PM  Depression screen PHQ 2/9  Decreased Interest 2 2 0 1 2  Down, Depressed, Hopeless 2 1 1  0 2  PHQ - 2 Score 4 3 1 1 4   Altered sleeping 2 1 0 1 2  Tired, decreased energy 3 2 2 1 2   Change in appetite 2 2 0 1 2  Feeling bad or failure about yourself  2 1 3  0 0  Trouble concentrating 1 1 0 0 0  Moving slowly or fidgety/restless 0 0 0 0 0  Suicidal thoughts 0 0 0 0 0   PHQ-9 Score 14 10 6 4 10   Difficult doing work/chores Very difficult Somewhat difficult Not difficult at all Not difficult at all     Relevant past medical, surgical, family and social history reviewed and updated as indicated. Interim medical history since our last visit reviewed. Allergies and medications reviewed and updated.  Review of Systems  Constitutional: Negative.   Respiratory: Negative.    Cardiovascular: Negative.   Gastrointestinal: Negative.   Musculoskeletal:  Positive for back pain, gait problem and myalgias. Negative for arthralgias, joint swelling, neck pain and neck stiffness.  Skin: Negative.   Psychiatric/Behavioral: Negative.      Per HPI unless specifically indicated above     Objective:    BP 122/81   Pulse 86   Temp 98.2 F (36.8 C)   Wt 167 lb 3.2 oz (75.8 kg)   LMP  (LMP Unknown)   SpO2 95%   BMI 30.58 kg/m   Wt Readings from Last 3 Encounters:  06/10/22 167 lb 3.2 oz (75.8 kg)  05/27/22 164 lb 9.6 oz (74.7 kg)  04/29/22 159 lb 12.8 oz (72.5 kg)    Physical Exam Vitals and nursing note reviewed.  Constitutional:  General: She is not in acute distress.    Appearance: Normal appearance. She is normal weight. She is not ill-appearing, toxic-appearing or diaphoretic.  HENT:     Head: Normocephalic and atraumatic.     Right Ear: External ear normal.     Left Ear: External ear normal.     Nose: Nose normal.     Mouth/Throat:     Mouth: Mucous membranes are moist.     Pharynx: Oropharynx is clear.  Eyes:     General: No scleral icterus.       Right eye: No discharge.        Left eye: No discharge.     Extraocular Movements: Extraocular movements intact.     Conjunctiva/sclera: Conjunctivae normal.     Pupils: Pupils are equal, round, and reactive to light.  Cardiovascular:     Rate and Rhythm: Normal rate and regular rhythm.     Pulses: Normal pulses.     Heart sounds: Normal heart sounds. No murmur heard.    No friction rub. No  gallop.  Pulmonary:     Effort: Pulmonary effort is normal. No respiratory distress.     Breath sounds: Normal breath sounds. No stridor. No wheezing, rhonchi or rales.  Chest:     Chest wall: No tenderness.  Musculoskeletal:        General: Normal range of motion.     Cervical back: Normal range of motion and neck supple.  Skin:    General: Skin is warm and dry.     Capillary Refill: Capillary refill takes less than 2 seconds.     Coloration: Skin is not jaundiced or pale.     Findings: No bruising, erythema, lesion or rash.  Neurological:     General: No focal deficit present.     Mental Status: She is alert and oriented to person, place, and time. Mental status is at baseline.  Psychiatric:        Mood and Affect: Mood normal.        Behavior: Behavior normal.        Thought Content: Thought content normal.        Judgment: Judgment normal.     Results for orders placed or performed in visit on 05/27/22  Comprehensive metabolic panel  Result Value Ref Range   Glucose 102 (H) 70 - 99 mg/dL   BUN 17 8 - 27 mg/dL   Creatinine, Ser 1.03 (H) 0.57 - 1.00 mg/dL   eGFR 55 (L) >59 mL/min/1.73   BUN/Creatinine Ratio 17 12 - 28   Sodium 141 134 - 144 mmol/L   Potassium 4.3 3.5 - 5.2 mmol/L   Chloride 102 96 - 106 mmol/L   CO2 24 20 - 29 mmol/L   Calcium 9.5 8.7 - 10.3 mg/dL   Total Protein 6.7 6.0 - 8.5 g/dL   Albumin 4.0 3.8 - 4.8 g/dL   Globulin, Total 2.7 1.5 - 4.5 g/dL   Albumin/Globulin Ratio 1.5 1.2 - 2.2   Bilirubin Total <0.2 0.0 - 1.2 mg/dL   Alkaline Phosphatase 73 44 - 121 IU/L   AST 20 0 - 40 IU/L   ALT 21 0 - 32 IU/L  CBC with Differential/Platelet  Result Value Ref Range   WBC 7.5 3.4 - 10.8 x10E3/uL   RBC 4.12 3.77 - 5.28 x10E6/uL   Hemoglobin 13.1 11.1 - 15.9 g/dL   Hematocrit 38.1 34.0 - 46.6 %   MCV 93 79 - 97 fL   MCH 31.8  26.6 - 33.0 pg   MCHC 34.4 31.5 - 35.7 g/dL   RDW 12.4 11.7 - 15.4 %   Platelets 231 150 - 450 x10E3/uL   Neutrophils 52 Not  Estab. %   Lymphs 31 Not Estab. %   Monocytes 11 Not Estab. %   Eos 5 Not Estab. %   Basos 0 Not Estab. %   Neutrophils Absolute 3.9 1.4 - 7.0 x10E3/uL   Lymphocytes Absolute 2.3 0.7 - 3.1 x10E3/uL   Monocytes Absolute 0.8 0.1 - 0.9 x10E3/uL   EOS (ABSOLUTE) 0.4 0.0 - 0.4 x10E3/uL   Basophils Absolute 0.0 0.0 - 0.2 x10E3/uL   Immature Granulocytes 1 Not Estab. %   Immature Grans (Abs) 0.0 0.0 - 0.1 x10E3/uL  TSH  Result Value Ref Range   TSH 4.040 0.450 - 4.500 uIU/mL  Lyme Disease Serology w/Reflex  Result Value Ref Range   Lyme Total Antibody EIA Negative Negative  [place] mtn spotted fvr abs pnl(IgG+IgM)  Result Value Ref Range   RMSF IgG Negative Negative   RMSF IgM 0.26 0.00 - 1.70 index  Ehrlichia Antibody Panel  Result Value Ref Range   E.Chaffeensis (HME) IgG Negative Neg:<1:64   E. Chaffeensis (HME) IgM Titer Negative Neg:<1:20   HGE IgG Titer Negative Neg:<1:64   HGE IgM Titer Negative Neg:<1:20   Result Comment: Comment   Babesia microti Antibody Panel  Result Value Ref Range   Babesia microti IgG <1:10 Neg:<1:10   Babesia microti IgM <1:10 Neg:<1:10   Result Comment: Comment       Assessment & Plan:   Problem List Items Addressed This Visit       Other   MDD (major depressive disorder), recurrent episode, moderate ([place]) - Primary    Has not been taking her cymbalta in at least 2 weeks. Pain is getting worse. Has not started higher dose of gabapentin either. Will get her back on her meds and recheck 6 weeks. Call with any concerns.       Relevant Medications   DULoxetine (CYMBALTA) 20 MG capsule   Other Visit Diagnoses     Total body pain       Has not been taking her cymbalta in at least 2 weeks. Pain is getting worse. Has not started higher dose of gabapentin either. Will restart and check 6 weeks.         Follow up plan: Return in about 6 weeks (around 07/22/2022).

## 2022-06-10 NOTE — Assessment & Plan Note (Signed)
Has not been taking her cymbalta in at least 2 weeks. Pain is getting worse. Has not started higher dose of gabapentin either. Will get her back on her meds and recheck 6 weeks. Call with any concerns.

## 2022-06-17 DIAGNOSIS — G479 Sleep disorder, unspecified: Secondary | ICD-10-CM | POA: Diagnosis not present

## 2022-06-17 DIAGNOSIS — R413 Other amnesia: Secondary | ICD-10-CM | POA: Diagnosis not present

## 2022-06-17 DIAGNOSIS — R251 Tremor, unspecified: Secondary | ICD-10-CM | POA: Diagnosis not present

## 2022-06-17 DIAGNOSIS — G20C Parkinsonism, unspecified: Secondary | ICD-10-CM | POA: Diagnosis not present

## 2022-06-20 ENCOUNTER — Ambulatory Visit (INDEPENDENT_AMBULATORY_CARE_PROVIDER_SITE_OTHER): Payer: Medicare Other | Admitting: *Deleted

## 2022-06-20 DIAGNOSIS — Z0000 Encounter for general adult medical examination without abnormal findings: Secondary | ICD-10-CM | POA: Diagnosis not present

## 2022-06-20 NOTE — Progress Notes (Signed)
Subjective:   Cynthia Vaughan is a 81 y.o. female who presents for Medicare Annual (Subsequent) preventive examination.  I connected with  [name] on 06/20/22 by a telephone enabled telemedicine application and verified that I am speaking with the correct person using two identifiers.   I discussed the limitations of evaluation and management by telemedicine. The patient expressed understanding and agreed to proceed.  Patient location: home  Provider location: Tele-health-home    Review of Systems           Objective:    There were no vitals filed for this visit. There is no height or weight on file to calculate BMI.     06/18/2021   11:19 AM 06/15/2020    2:32 PM 05/12/2020   10:33 AM 06/13/2019    2:50 PM 02/18/2019   12:52 PM 12/18/2017    4:30 PM 04/20/2017    2:53 PM  Advanced Directives  Does Patient Have a Medical Advance Directive? No No No No  No No  Does patient want to make changes to medical advance directive?    Yes (MAU/Ambulatory/Procedural Areas - Information given)     Would patient like information on creating a medical advance directive?   No - Patient declined   No - Patient declined No - Patient declined     Information is confidential and restricted. Go to Review Flowsheets to unlock data.    Current Medications (verified) Outpatient Encounter Medications as of 06/20/2022  Medication Sig   acetaminophen (TYLENOL) 500 MG tablet Take 500 mg by mouth every 6 (six) hours as needed.   amLODipine (NORVASC) 2.5 MG tablet Take 1 tablet (2.5 mg total) by mouth daily.   aspirin EC 81 MG tablet Take 81 mg by mouth daily.   Calcium Carb-Cholecalciferol (CALCIUM 600 + D PO) Take by mouth daily.   donepezil (ARICEPT) 10 MG tablet Take 10 mg by mouth at bedtime.   DULoxetine (CYMBALTA) 20 MG capsule Take 1 a day for 2 weeks then increase to 2 pills daily   fluticasone (FLONASE) 50 MCG/ACT nasal spray Instill 2 sprays in each nostril daily.    gabapentin (NEURONTIN) 300 MG capsule Take 2 capsules (600 mg total) by mouth 2 (two) times daily.   loratadine (CLARITIN) 10 MG tablet Take 1 tablet (10 mg total) by mouth daily.   losartan (COZAAR) 50 MG tablet Take 1 tablet (50 mg total) by mouth daily.   memantine (NAMENDA) 5 MG tablet Take 5 mg by mouth daily.   omeprazole (PRILOSEC) 20 MG capsule Take 1 capsule (20 mg total) by mouth daily.   traZODone (DESYREL) 50 MG tablet Take 25 mg by mouth at bedtime as needed.   No facility-administered encounter medications on file as of 06/20/2022.    Allergies (verified) Codeine and Lisinopril   History: Past Medical History:  Diagnosis Date   Allergy    Anxiety    Atypical mole 04/14/2021   ATYPICAL JUNCTIONAL LENTIGINOUS MELANOCYTIC PROLIFERATION, R lower elbow, exc 07/05/2021   Cancer (HCC)    SKIN   CKD (chronic kidney disease) stage 2, GFR 60-89 ml/min    Depression    Edema    LEGS/FEET   GERD (gastroesophageal reflux disease)    Heart murmur    Hx of basal cell carcinoma 10/20/2009   R lateral infraorbital   Hx of basal cell carcinoma 06/30/2015   R infra orbital scar   Hyperlipidemia    Hypertension    Hypopotassemia  Microalbuminuria    Osteoarthritis    feet, legs   Osteopenia    RMSF [facility] spotted fever) 05/10/2016   Urinary incontinence    Vitamin D deficiency disease    Wears dentures    partial lower   Wheezing    Past Surgical History:  Procedure Laterality Date   BLADDER SURGERY     x 2   BUNIONECTOMY     CATARACT EXTRACTION W/PHACO Right 11/23/2015   Procedure: CATARACT EXTRACTION PHACO AND INTRAOCULAR LENS PLACEMENT ([place]);  Surgeon: Estill Cotta, MD;  Location: [facility] ORS;  Service: Ophthalmology;  Laterality: Right;  [place]    1:30.9 AP%  26.9 CDE   42.29 flud casette lot # [id number]  exp05/31/2018   CATARACT EXTRACTION W/PHACO Left 05/12/2020   Procedure: CATARACT EXTRACTION PHACO AND INTRAOCULAR LENS PLACEMENT ([facility]) LEFT 15.24  01:28.8;  Surgeon: Birder Robson, MD;  Location: [place];  Service: Ophthalmology;  Laterality: Left;   COLONOSCOPY     TOTAL ABDOMINAL HYSTERECTOMY  08/22/2002   Total (due to bladder surgery)   Family History  Problem Relation Age of Onset   AAA (abdominal aortic aneurysm) Mother    Arthritis Brother    Heart disease Brother    Stroke Daughter    Diabetes Son    Stroke Son    Seizures Son    Diabetes Brother    Heart disease Brother        7 total heart attacks   Hyperlipidemia Brother    Hypertension Brother    Dementia Brother    AAA (abdominal aortic aneurysm) Brother    Stroke Son    Heart disease Brother    Dementia Sister    Social History   Socioeconomic History   Marital status: Married    Spouse name: hal   Number of children: 3   Years of education: Not on file   Highest education level: GED or equivalent  Occupational History   Occupation: retired   Tobacco Use   Smoking status: Former    Types: Cigarettes    Quit date: 1990    Years since quitting: 33.8   Smokeless tobacco: Never   Tobacco comments:       Vaping Use   Vaping Use: Never used  Substance and Sexual Activity   Alcohol use: Not Currently    Comment: OCCAS   Drug use: No   Sexual activity: Not Currently  Other Topics Concern   Not on file  Social History Narrative   Not on file   Social Determinants of Health   Financial Resource Strain: Low Risk  (06/18/2021)   Overall Financial Resource Strain (CARDIA)    Difficulty of Paying Living Expenses: Not hard at all  Food Insecurity: No Food Insecurity (06/18/2021)   Hunger Vital Sign    Worried About Running Out of Food in the Last Year: Never true    [place] in the Last Year: Never true  Transportation Needs: No Transportation Needs (06/18/2021)   PRAPARE - Hydrologist (Medical): No    Lack of Transportation (Non-Medical): No  Physical Activity: Inactive (06/18/2021)    Exercise Vital Sign    Days of Exercise per Week: 0 days    Minutes of Exercise per Session: 0 min  Stress: No Stress Concern Present (06/18/2021)   [place]    Feeling of Stress : Not at all  Social Connections: Unknown (02/18/2019)  Social Licensed conveyancer [NHANES]    Frequency of Communication with Friends and Family: Not on file    Frequency of Social Gatherings with Friends and Family: Not on file    Attends Religious Services: More than 4 times per year    Active Member of [employer] or Organizations: Yes    Attends Music therapist: More than 4 times per year    Marital Status: Married    Tobacco Counseling Counseling given: Not Answered Tobacco comments:     Clinical Intake:                 Diabetic?  no         Activities of Daily Living     No data to display          Patient Care Team: [name], DO as PCP - General (Family Medicine)  Indicate any recent Medical Services you may have received from other than [facility] providers in the past year (date may be approximate).     Assessment:   This is a routine wellness examination for Shara.  Hearing/Vision screen No results found.  Dietary issues and exercise activities discussed:     Goals Addressed   None    Depression Screen    06/10/2022    2:30 PM 05/27/2022    2:40 PM 04/01/2022    2:24 PM 12/30/2021    2:37 PM 10/01/2021    2:44 PM 06/18/2021   11:20 AM 03/26/2021    1:51 PM  PHQ 2/9 Scores  PHQ - 2 Score '4 3 1 1 4 '$ 0 0  PHQ- 9 Score '14 10 6 4 10  4    '$ Fall Risk    06/10/2022    2:30 PM 06/18/2021   11:20 AM 03/26/2021    1:33 PM 06/15/2020    3:34 PM 06/15/2020    2:33 PM  Fall Risk   Falls in the past year? 1 0 0 0 0  Number falls in past yr: 0  0    Injury with Fall? 0  0    Risk for fall due to : Impaired balance/gait Medication side effect No Fall Risks  Medication side  effect  Follow up Falls evaluation completed Falls evaluation completed;Education provided;Falls prevention discussed Falls evaluation completed Falls evaluation completed Education provided;Falls evaluation completed;Falls prevention discussed    FALL RISK PREVENTION PERTAINING TO THE HOME:  Any stairs in or around the home? No  If so, are there any without handrails? No  Home free of loose throw rugs in walkways, pet beds, electrical cords, etc? Yes  Adequate lighting in your home to reduce risk of falls? Yes   ASSISTIVE DEVICES UTILIZED TO PREVENT FALLS:  Life alert? No  Use of a cane, walker or w/c? No  Grab bars in the bathroom? Yes  Shower chair or bench in shower? Yes  Elevated toilet seat or a handicapped toilet? No   TIMED UP AND GO:  Was the test performed? No .    Cognitive Function:    12/04/2018    1:17 PM 12/18/2017    2:12 PM  MMSE - Mini Mental State Exam  Orientation to time 3 3  Orientation to Place 5 4  Registration 3 3  Attention/ Calculation 2 5  Recall 0 0  Language- name 2 objects 2 2  Language- repeat 1 1  Language- follow 3 step command 3 3  Language- read & follow direction 1 1  Write  a sentence 1 1  Copy design 1 1  Total score 22 24        06/18/2021   11:22 AM 06/15/2020    2:36 PM 06/13/2019    2:32 PM 04/20/2017    2:57 PM  6CIT Screen  What Year? 0 points 0 points 0 points 0 points  What month? 3 points 0 points 0 points 0 points  What time? 0 points 0 points 0 points 0 points  Count back from 20 0 points 0 points 0 points 0 points  Months in reverse 4 points 4 points 0 points 0 points  Repeat phrase 10 points 6 points 4 points 0 points  Total Score 17 points 10 points 4 points 0 points    Immunizations Immunization History  Administered Date(s) Administered   Fluad Quad(high Dose 65+) 05/16/2019, 06/15/2020, 10/01/2021   Influenza, High Dose Seasonal PF 06/21/2017, 06/01/2018   Influenza,inj,Quad PF,6+ Mos 05/21/2015    Influenza,inj,quad, With Preservative 06/22/2016   Influenza-Unspecified 06/11/2014, 05/21/2015, 07/04/2016   PFIZER(Purple Top)SARS-COV-2 Vaccination 10/14/2019, 11/04/2019, 08/25/2020   Pneumococcal Conjugate-13 07/07/2015   Pneumococcal Polysaccharide-23 02/15/2006, 11/04/2010   Td 02/15/2006, 06/01/2018   Zoster, Live 02/05/2008    TDAP status: Up to date  Flu Vaccine status: Up to date  Pneumococcal vaccine status: Up to date  Covid-19 vaccine status: Information provided on how to obtain vaccines.   Qualifies for Shingles Vaccine? Yes   Zostavax completed Yes   Shingrix Completed?: No.    Education has been provided regarding the importance of this vaccine. Patient has been advised to call insurance company to determine out of pocket expense if they have not yet received this vaccine. Advised may also receive vaccine at local pharmacy or Health Dept. Verbalized acceptance and understanding.  Screening Tests Health Maintenance  Topic Date Due   Zoster Vaccines- Shingrix (1 of 2) Never done   COVID-19 Vaccine (4 - Pfizer risk series) 10/20/2020   INFLUENZA VACCINE  03/22/2022   Medicare Annual Wellness (AWV)  06/18/2022   TETANUS/TDAP  06/01/2028   Pneumonia Vaccine 32+ Years old  Completed   DEXA SCAN  Addressed   HPV VACCINES  Aged Out    Health Maintenance  Health Maintenance Due  Topic Date Due   Zoster Vaccines- Shingrix (1 of 2) Never done   COVID-19 Vaccine (4 - Pfizer risk series) 10/20/2020   INFLUENZA VACCINE  03/22/2022   Medicare Annual Wellness (AWV)  06/18/2022    Colorectal cancer screening: No longer required.   Mammogram status: No longer required due to  .  Bone Density Declined  Lung Cancer Screening: (Low Dose CT Chest recommended if Age 50-80 years, 30 pack-year currently smoking OR have quit w/in 15years.) does not qualify.   Lung Cancer Screening Referral:   Additional Screening:  Hepatitis C Screening: does not qualify; Completed  2021  Vision Screening: Recommended annual ophthalmology exams for early detection of glaucoma and other disorders of the eye. Is the patient up to date with their annual eye exam?  Yes  Who is the provider or what is the name of the office in which the patient attends annual eye exams? [facility] If pt is not established with a provider, would they like to be referred to a provider to establish care? No .   Dental Screening: Recommended annual dental exams for proper oral hygiene  Community Resource Referral / Chronic Care Management: CRR required this visit?  No   CCM required this visit?  No  Plan:     I have personally reviewed and noted the following in the patient's chart:   Medical and social history Use of alcohol, tobacco or illicit drugs  Current medications and supplements including opioid prescriptions. Patient is not currently taking opioid prescriptions. Functional ability and status Nutritional status Physical activity Advanced directives List of other physicians Hospitalizations, surgeries, and ER visits in previous 12 months Vitals Screenings to include cognitive, depression, and falls Referrals and appointments  In addition, I have reviewed and discussed with patient certain preventive protocols, quality metrics, and best practice recommendations. A written personalized care plan for preventive services as well as general preventive health recommendations were provided to patient.     Leroy Kennedy, LPN   [id number]   Nurse Notes:

## 2022-06-20 NOTE — Patient Instructions (Signed)
Cynthia Vaughan , Thank you for taking time to come for your Medicare Wellness Visit. I appreciate your ongoing commitment to your health goals. Please review the following plan we discussed and let me know if I can assist you in the future.   These are the goals we discussed:  Goals       "She might need help managing her medications" (pt-stated)      Current Barriers:  Diminished mood/flat affect, as well as "sleepiness" over the past few months. Additionally, patient struggling with tremor that inhibits some activities (husband notes she cannot hold a cup of coffee) Established with psychiatry on 02/18/2019 - d/c aripiprazole and reduced duloxetine, started bupropion. At follow up appointment on 04/01/2019, it was noted that patient was improved; reported improvement in energy/motivation, as well as improvement in mood. Denies trouble sleeping. Today, patient's husband, Cynthia Vaughan, confirms the above information. Also notes possible improvement in tremor. Confirms neurology follow up on 05/01/2019 with Dr. Melrose Vaughan Does note that he picked up a prescription for donepezil 10 mg daily from [facility] that was prescribed by neurology. Somewhat frustrated, as they prefer all medications come from [facility]. This was how patient accidentally took 20 mg donepezil for a while; they filled prescriptions at both [facility] and [facility] (unsure how insurance allowed this)   Pharmacist Clinical Goal(s):  Over the next 90 days, patient will work with primary care provider and CCM team to address needs related to optimized medication management  Interventions: Husband knows to look out at upcoming [place] and if donepezil is included, to not give an extra donepezil from the [place] fill. Encouraged him to update the patient's preferred pharmacy at [facility] when they go next month  Patient Self Care Activities:  Currently UNABLE TO independently manage health  Please see past updates related to this goal  by clicking on the "Past Updates" button in the selected goal        I am just so sleepy and I don't know why (pt-stated)      Current Barriers:  Knowledge Deficits related to recent symptom of increased letharygy   Nurse Case Manager Clinical Goal(s):  Over the next 90 days, patient will work with [facility] to address needs related to incresed letharygy  Interventions:  Provided education to patient re: on CCM and what resources CCM is availible to provide    Met with patient and her spouse in exam room. 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  Spouse states patient continues to extremely lethargic Spouse states Home Health has started and is helpful Spouse states Neurology ordered Psych referral but he has not heard from them,  Left a Vm with Psych referral coordinator Cynthia Vaughan Referral coordinator called and confirmed she was able to reach spouse and schedule appointment- July 9 with Dr. Thomasena Vaughan @ [facility] [place].    Patient Self Care Activities:  Currently UNABLE TO independently maintain activities of daily living realted to increased lethargy  Please see past updates related to this goal by clicking on the "Past Updates" button in the selected goal        Increase water intake       Recommend drinking at least 3-4 glasses of water a day       Patient Stated  06/15/2020, no goals      Patient Stated      06/18/2021, no goals      Patient Stated      No goals        This is a list of the screening recommended for you and due dates:  Health Maintenance  Topic Date Due   Flu Shot  03/22/2022   COVID-19 Vaccine (4 - Pfizer risk series) 07/06/2022*   Zoster (Shingles) Vaccine (1 of 2) 09/20/2022*   Medicare Annual Wellness Visit  06/21/2023   Tetanus Vaccine   06/01/2028   Pneumonia Vaccine  Completed   DEXA scan (bone density measurement)  Addressed   HPV Vaccine  Aged Out  *Topic was postponed. The date shown is not the original due date.    Advanced directives: Education provided  Conditions/risks identified:     Preventive Care 72 Years and Older, Female Preventive care refers to lifestyle choices and visits with your health care provider that can promote health and wellness. What does preventive care include? A yearly physical exam. This is also called an annual well check. Dental exams once or twice a year. Routine eye exams. Ask your health care provider how often you should have your eyes checked. Personal lifestyle choices, including: Daily care of your teeth and gums. Regular physical activity. Eating a healthy diet. Avoiding tobacco and drug use. Limiting alcohol use. Practicing safe sex. Taking low-dose aspirin every day. Taking vitamin and mineral supplements as recommended by your health care provider. What happens during an annual well check? The services and screenings done by your health care provider during your annual well check will depend on your age, overall health, lifestyle risk factors, and family history of disease. Counseling  Your health care provider may ask you questions about your: Alcohol use. Tobacco use. Drug use. Emotional well-being. Home and relationship well-being. Sexual activity. Eating habits. History of falls. Memory and ability to understand (cognition). Work and work Statistician. Reproductive health. Screening  You may have the following tests or measurements: Height, weight, and BMI. Blood pressure. Lipid and cholesterol levels. These may be checked every 5 years, or more frequently if you are over 49 years old. Skin check. Lung cancer screening. You may have this screening every year starting at age 54 if you have a 30-pack-year history of smoking and currently smoke or have  quit within the past 15 years. Fecal occult blood test (FOBT) of the stool. You may have this test every year starting at age 66. Flexible sigmoidoscopy or colonoscopy. You may have a sigmoidoscopy every 5 years or a colonoscopy every 10 years starting at age 57. Hepatitis C blood test. Hepatitis B blood test. Sexually transmitted disease (STD) testing. Diabetes screening. This is done by checking your blood sugar (glucose) after you have not eaten for a while (fasting). You may have this done every 1-3 years. Bone density scan. This is done to screen for osteoporosis. You may have this done starting at age 85. Mammogram. This may be done every 1-2 years. Talk to your health care provider about how often you should have regular mammograms. Talk with your health care provider about your test results, treatment options, and if necessary, the need for more tests. Vaccines  Your health care provider may recommend certain vaccines, such as: Influenza vaccine. This is recommended every year. Tetanus, diphtheria, and acellular pertussis (Tdap, Td) vaccine. You may need a Td booster every 10 years. Zoster vaccine. You may need this after age  60. Pneumococcal 13-valent conjugate (PCV13) vaccine. One dose is recommended after age 66. Pneumococcal polysaccharide (PPSV23) vaccine. One dose is recommended after age 37. Talk to your health care provider about which screenings and vaccines you need and how often you need them. This information is not intended to replace advice given to you by your health care provider. Make sure you discuss any questions you have with your health care provider. Document Released: 09/04/2015 Document Revised: 04/27/2016 Document Reviewed: 06/09/2015 Elsevier Interactive Patient Education  2017 [employer] Prevention in the Home Falls can cause injuries. They can happen to people of all ages. There are many things you can do to make your home safe and to help prevent  falls. What can I do on the outside of my home? Regularly fix the edges of walkways and driveways and fix any cracks. Remove anything that might make you trip as you walk through a door, such as a raised step or threshold. Trim any bushes or trees on the path to your home. Use bright outdoor lighting. Clear any walking paths of anything that might make someone trip, such as rocks or tools. Regularly check to see if handrails are loose or broken. Make sure that both sides of any steps have handrails. Any raised decks and porches should have guardrails on the edges. Have any leaves, snow, or ice cleared regularly. Use sand or salt on walking paths during winter. Clean up any spills in your garage right away. This includes oil or grease spills. What can I do in the bathroom? Use night lights. Install grab bars by the toilet and in the tub and shower. Do not use towel bars as grab bars. Use non-skid mats or decals in the tub or shower. If you need to sit down in the shower, use a plastic, non-slip stool. Keep the floor dry. Clean up any water that spills on the floor as soon as it happens. Remove soap buildup in the tub or shower regularly. Attach bath mats securely with double-sided non-slip rug tape. Do not have throw rugs and other things on the floor that can make you trip. What can I do in the bedroom? Use night lights. Make sure that you have a light by your bed that is easy to reach. Do not use any sheets or blankets that are too big for your bed. They should not hang down onto the floor. Have a firm chair that has side arms. You can use this for support while you get dressed. Do not have throw rugs and other things on the floor that can make you trip. What can I do in the kitchen? Clean up any spills right away. Avoid walking on wet floors. Keep items that you use a lot in easy-to-reach places. If you need to reach something above you, use a strong step stool that has a grab  bar. Keep electrical cords out of the way. Do not use floor polish or wax that makes floors slippery. If you must use wax, use non-skid floor wax. Do not have throw rugs and other things on the floor that can make you trip. What can I do with my stairs? Do not leave any items on the stairs. Make sure that there are handrails on both sides of the stairs and use them. Fix handrails that are broken or loose. Make sure that handrails are as long as the stairways. Check any carpeting to make sure that it is firmly attached to the stairs. Fix  any carpet that is loose or worn. Avoid having throw rugs at the top or bottom of the stairs. If you do have throw rugs, attach them to the floor with carpet tape. Make sure that you have a light switch at the top of the stairs and the bottom of the stairs. If you do not have them, ask someone to add them for you. What else can I do to help prevent falls? Wear shoes that: Do not have high heels. Have rubber bottoms. Are comfortable and fit you well. Are closed at the toe. Do not wear sandals. If you use a stepladder: Make sure that it is fully opened. Do not climb a closed stepladder. Make sure that both sides of the stepladder are locked into place. Ask someone to hold it for you, if possible. Clearly mark and make sure that you can see: Any grab bars or handrails. First and last steps. Where the edge of each step is. Use tools that help you move around (mobility aids) if they are needed. These include: Canes. Walkers. Scooters. Crutches. Turn on the lights when you go into a dark area. Replace any light bulbs as soon as they burn out. Set up your furniture so you have a clear path. Avoid moving your furniture around. If any of your floors are uneven, fix them. If there are any pets around you, be aware of where they are. Review your medicines with your doctor. Some medicines can make you feel dizzy. This can increase your chance of falling. Ask  your doctor what other things that you can do to help prevent falls. This information is not intended to replace advice given to you by your health care provider. Make sure you discuss any questions you have with your health care provider. Document Released: 06/04/2009 Document Revised: 01/14/2016 Document Reviewed: 09/12/2014 Elsevier Interactive Patient Education  2017 [employer].

## 2022-07-20 ENCOUNTER — Other Ambulatory Visit: Payer: Self-pay | Admitting: Family Medicine

## 2022-07-20 NOTE — Telephone Encounter (Signed)
Requested medication (s) are due for refill today: Due 07/27/22  Requested medication (s) are on the active medication list: yes    Last refill: 05/27/22  #120  1  refill  Future visit scheduled yes 07/22/22  Notes to clinic: Pt has appt in 2 days, 07/22/22. Please review.  Requested Prescriptions  Pending Prescriptions Disp Refills   gabapentin (NEURONTIN) 300 MG capsule [Pharmacy Med Name: Gabapentin '300mg'$  Capsule] 120 capsule 0    Sig: Take 2 capsules by mouth twice daily.     Neurology: Anticonvulsants - gabapentin Failed - 07/20/2022  6:34 AM      Failed - Cr in normal range and within 360 days    Creatinine  Date Value Ref Range Status  06/08/2014 0.73 0.60 - 1.30 mg/dL Final   Creatinine, Ser  Date Value Ref Range Status  05/27/2022 1.03 (H) 0.57 - 1.00 mg/dL Final         Passed - Completed PHQ-2 or PHQ-9 in the last 360 days      Passed - Valid encounter within last 12 months    Recent Outpatient Visits           1 month ago MDD (major depressive disorder), recurrent episode, moderate ([place])   [place], Megan P, DO   1 month ago Total body pain   [place], Megan P, DO   2 months ago MDD (major depressive disorder), recurrent episode, moderate ([place])   [place], Megan P, DO   3 months ago Benign hypertensive renal disease   [facility] [place], Megan P, DO   6 months ago MDD (major depressive disorder), recurrent episode, moderate ([place])   [place], [place], DO       Future Appointments             In 2 days Wynetta Emery, Barb Merino, DO [employer], [place]   In 5 days Brendolyn Patty, MD [place]

## 2022-07-22 ENCOUNTER — Encounter: Payer: Self-pay | Admitting: Family Medicine

## 2022-07-22 ENCOUNTER — Ambulatory Visit (INDEPENDENT_AMBULATORY_CARE_PROVIDER_SITE_OTHER): Payer: Medicare Other | Admitting: Family Medicine

## 2022-07-22 VITALS — BP 129/81 | HR 85 | Temp 98.9°F | Ht 62.0 in | Wt 172.5 lb

## 2022-07-22 DIAGNOSIS — R52 Pain, unspecified: Secondary | ICD-10-CM | POA: Diagnosis not present

## 2022-07-22 DIAGNOSIS — F331 Major depressive disorder, recurrent, moderate: Secondary | ICD-10-CM

## 2022-07-22 MED ORDER — KETOROLAC TROMETHAMINE 60 MG/2ML IM SOLN
60.0000 mg | Freq: Once | INTRAMUSCULAR | Status: AC
  Administered 2022-07-22: 60 mg via INTRAMUSCULAR

## 2022-07-22 MED ORDER — DULOXETINE HCL 60 MG PO CPEP: 60.0000 mg | ORAL_CAPSULE | Freq: Every day | ORAL | 1 refills | Status: DC

## 2022-07-22 NOTE — Assessment & Plan Note (Signed)
Doing well with cymbalta. Will increase to '60mg'$  and recheck 1 month. Call with any concerns.

## 2022-07-22 NOTE — Progress Notes (Signed)
BP 129/81   Pulse 85   Temp 98.9 F (37.2 C) (Oral)   Ht 5' 2" (1.575 m)   Wt 172 lb 8 oz (78.2 kg)   LMP  (LMP Unknown)   SpO2 96%   BMI 31.55 kg/m    Subjective:    Patient ID: Cynthia Vaughan, female    DOB: May 04, 1941, 81 y.o.   MRN: [id number]  HPI: Cynthia Vaughan is a 81 y.o. female  Chief Complaint  Patient presents with   Depression   DEPRESSION Mood status: stable Satisfied with current treatment?: yes Symptom severity: mild  Duration of current treatment : months Side effects: no Medication compliance: excellent compliance Psychotherapy/counseling: no  Depressed mood: yes Anxious mood: yes Anhedonia: no Significant weight loss or gain: no Insomnia: no  Fatigue: yes Feelings of worthlessness or guilt: no Impaired concentration/indecisiveness: no Suicidal ideations: no Hopelessness: no Crying spells: no    07/22/2022    2:41 PM 06/20/2022   11:40 AM 06/10/2022    2:30 PM 05/27/2022    2:40 PM 04/01/2022    2:24 PM  Depression screen PHQ 2/9  Decreased Interest _0 0  Down, Depressed, Hopeless 1 0 _1 PHQ - 2 Score _2 Altered sleeping 0 0 2 1 0  Tired, decreased energy _3 Change in appetite 1 0 2 2 0  Feeling bad or failure about yourself  2 0 _4 Trouble concentrating 0 0 1 1 0  Moving slowly or fidgety/restless 0 0 0 0 0  Suicidal thoughts 0 0 0 0 0  PHQ-9 Score _5 Difficult doing work/chores Somewhat difficult Somewhat difficult Very difficult Somewhat difficult Not difficult at all   Pain is still not doing great- has been improving, but still has a lot of pain. Husband feels like she is doing better on her current dose. No other concerns or complaints at this time.   Relevant past medical, surgical, family and social history reviewed and updated as indicated. Interim medical history since our last visit reviewed. Allergies and medications reviewed and updated.  Review of Systems  Constitutional:  Negative.   Respiratory: Negative.    Cardiovascular: Negative.   Gastrointestinal: Negative.   Musculoskeletal:  Positive for arthralgias, back pain and myalgias. Negative for gait problem, joint swelling, neck pain and neck stiffness.  Skin: Negative.   Neurological: Negative.   Psychiatric/Behavioral:  Positive for dysphoric mood. Negative for agitation, behavioral problems, confusion, decreased concentration, hallucinations, self-injury, sleep disturbance and suicidal ideas. The patient is nervous/anxious. The patient is not hyperactive.     Per HPI unless specifically indicated above     Objective:    BP 129/81   Pulse 85   Temp 98.9 F (37.2 C) (Oral)   Ht 5' 2" (1.575 m)   Wt 172 lb 8 oz (78.2 kg)   LMP  (LMP Unknown)   SpO2 96%   BMI 31.55 kg/m   Wt Readings from Last 3 Encounters:  07/22/22 172 lb 8 oz (78.2 kg)  06/10/22 167 lb 3.2 oz (75.8 kg)  05/27/22 164 lb 9.6 oz (74.7 kg)    Physical Exam Vitals and nursing note reviewed.  Constitutional:      General: She is not in acute distress.    Appearance: Normal appearance. She is not ill-appearing, toxic-appearing or diaphoretic.  HENT:     Head: Normocephalic and atraumatic.  Right Ear: External ear normal.     Left Ear: External ear normal.     Nose: Nose normal.     Mouth/Throat:     Mouth: Mucous membranes are moist.     Pharynx: Oropharynx is clear.  Eyes:     General: No scleral icterus.       Right eye: No discharge.        Left eye: No discharge.     Extraocular Movements: Extraocular movements intact.     Conjunctiva/sclera: Conjunctivae normal.     Pupils: Pupils are equal, round, and reactive to light.  Cardiovascular:     Rate and Rhythm: Normal rate and regular rhythm.     Pulses: Normal pulses.     Heart sounds: Normal heart sounds. No murmur heard.    No friction rub. No gallop.  Pulmonary:     Effort: Pulmonary effort is normal. No respiratory distress.     Breath sounds: Normal  breath sounds. No stridor. No wheezing, rhonchi or rales.  Chest:     Chest wall: No tenderness.  Musculoskeletal:        General: Normal range of motion.     Cervical back: Normal range of motion and neck supple.  Skin:    General: Skin is warm and dry.     Capillary Refill: Capillary refill takes less than 2 seconds.     Coloration: Skin is not jaundiced or pale.     Findings: No bruising, erythema, lesion or rash.  Neurological:     General: No focal deficit present.     Mental Status: She is alert and oriented to person, place, and time. Mental status is at baseline.  Psychiatric:        Mood and Affect: Mood normal.        Behavior: Behavior normal.        Thought Content: Thought content normal.        Judgment: Judgment normal.     Results for orders placed or performed in visit on 05/27/22  Comprehensive metabolic panel  Result Value Ref Range   Glucose 102 (H) 70 - 99 mg/dL   BUN 17 8 - 27 mg/dL   Creatinine, Ser 1.03 (H) 0.57 - 1.00 mg/dL   eGFR 55 (L) >59 mL/min/1.73   BUN/Creatinine Ratio 17 12 - 28   Sodium 141 134 - 144 mmol/L   Potassium 4.3 3.5 - 5.2 mmol/L   Chloride 102 96 - 106 mmol/L   CO2 24 20 - 29 mmol/L   Calcium 9.5 8.7 - 10.3 mg/dL   Total Protein 6.7 6.0 - 8.5 g/dL   Albumin 4.0 3.8 - 4.8 g/dL   Globulin, Total 2.7 1.5 - 4.5 g/dL   Albumin/Globulin Ratio 1.5 1.2 - 2.2   Bilirubin Total <0.2 0.0 - 1.2 mg/dL   Alkaline Phosphatase 73 44 - 121 IU/L   AST 20 0 - 40 IU/L   ALT 21 0 - 32 IU/L  CBC with Differential/Platelet  Result Value Ref Range   WBC 7.5 3.4 - 10.8 x10E3/uL   RBC 4.12 3.77 - 5.28 x10E6/uL   Hemoglobin 13.1 11.1 - 15.9 g/dL   Hematocrit 38.1 34.0 - 46.6 %   MCV 93 79 - 97 fL   MCH 31.8 26.6 - 33.0 pg   MCHC 34.4 31.5 - 35.7 g/dL   RDW 12.4 11.7 - 15.4 %   Platelets 231 150 - 450 x10E3/uL   Neutrophils 52 Not Estab. %   Lymphs  31 Not Estab. %   Monocytes 11 Not Estab. %   Eos 5 Not Estab. %   Basos 0 Not Estab. %    Neutrophils Absolute 3.9 1.4 - 7.0 x10E3/uL   Lymphocytes Absolute 2.3 0.7 - 3.1 x10E3/uL   Monocytes Absolute 0.8 0.1 - 0.9 x10E3/uL   EOS (ABSOLUTE) 0.4 0.0 - 0.4 x10E3/uL   Basophils Absolute 0.0 0.0 - 0.2 x10E3/uL   Immature Granulocytes 1 Not Estab. %   Immature Grans (Abs) 0.0 0.0 - 0.1 x10E3/uL  TSH  Result Value Ref Range   TSH 4.040 0.450 - 4.500 uIU/mL  Lyme Disease Serology w/Reflex  Result Value Ref Range   Lyme Total Antibody EIA Negative Negative  Rocky mtn spotted fvr abs pnl(IgG+IgM)  Result Value Ref Range   RMSF IgG Negative Negative   RMSF IgM 0.26 0.00 - 2.75 index  Ehrlichia Antibody Panel  Result Value Ref Range   E.Chaffeensis (HME) IgG Negative Neg:<1:64   E. Chaffeensis (HME) IgM Titer Negative Neg:<1:20   HGE IgG Titer Negative Neg:<1:64   HGE IgM Titer Negative Neg:<1:20   Result Comment: Comment   Babesia microti Antibody Panel  Result Value Ref Range   Babesia microti IgG <1:10 Neg:<1:10   Babesia microti IgM <1:10 Neg:<1:10   Result Comment: Comment       Assessment & Plan:   Problem List Items Addressed This Visit       Other   MDD (major depressive disorder), recurrent episode, moderate ([place]) - Primary    Doing well with cymbalta. Will increase to 40m and recheck 1 month. Call with any concerns.       Relevant Medications   DULoxetine (CYMBALTA) 60 MG capsule   Other Visit Diagnoses     Total body pain       Pain slightly better on higher dose of cymbalta and gabapentin. Will increse to 69mand recheck in about 1 month. Call with any concerns.   Relevant Medications   ketorolac (TORADOL) injection 60 mg        Follow up plan: Return in about 4 weeks (around 08/19/2022).

## 2022-07-25 ENCOUNTER — Ambulatory Visit (INDEPENDENT_AMBULATORY_CARE_PROVIDER_SITE_OTHER): Payer: Medicare Other | Admitting: Dermatology

## 2022-07-25 ENCOUNTER — Encounter: Payer: Self-pay | Admitting: Dermatology

## 2022-07-25 VITALS — BP 144/96 | HR 88

## 2022-07-25 DIAGNOSIS — Z1283 Encounter for screening for malignant neoplasm of skin: Secondary | ICD-10-CM

## 2022-07-25 DIAGNOSIS — D2339 Other benign neoplasm of skin of other parts of face: Secondary | ICD-10-CM

## 2022-07-25 DIAGNOSIS — L814 Other melanin hyperpigmentation: Secondary | ICD-10-CM

## 2022-07-25 DIAGNOSIS — D229 Melanocytic nevi, unspecified: Secondary | ICD-10-CM | POA: Diagnosis not present

## 2022-07-25 DIAGNOSIS — L821 Other seborrheic keratosis: Secondary | ICD-10-CM | POA: Diagnosis not present

## 2022-07-25 DIAGNOSIS — Z85828 Personal history of other malignant neoplasm of skin: Secondary | ICD-10-CM | POA: Diagnosis not present

## 2022-07-25 DIAGNOSIS — L578 Other skin changes due to chronic exposure to nonionizing radiation: Secondary | ICD-10-CM | POA: Diagnosis not present

## 2022-07-25 DIAGNOSIS — T148XXA Other injury of unspecified body region, initial encounter: Secondary | ICD-10-CM

## 2022-07-25 DIAGNOSIS — D2330 Other benign neoplasm of skin of unspecified part of face: Secondary | ICD-10-CM

## 2022-07-25 MED ORDER — MUPIROCIN 2 % EX OINT: 1.0000 | TOPICAL_OINTMENT | Freq: Every day | CUTANEOUS | 1 refills | Status: DC

## 2022-07-25 NOTE — Progress Notes (Signed)
Follow-Up Visit   Subjective  Cynthia Vaughan is a 81 y.o. female who presents for the following: Total body skin exam.  H/o atypical nevus, BCC.    The following portions of the chart were reviewed this encounter and updated as appropriate:       Review of Systems:  No other skin or systemic complaints except as noted in HPI or Assessment and Plan.  Objective  Well appearing patient in no apparent distress; mood and affect are within normal limits.  A full examination was performed including scalp, head, eyes, ears, nose, lips, neck, chest, axillae, abdomen, back, buttocks, bilateral upper extremities, bilateral lower extremities, hands, feet, fingers, toes, fingernails, and toenails. All findings within normal limits unless otherwise noted below.  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  - Call if any new or changing lesions are noted between office visits  - R lower elbow  History of Basal Cell Carcinoma of the Skin - No evidence of recurrence today - Recommend regular full body skin exams - Recommend daily broad spectrum sunscreen SPF 30+ to sun-exposed areas, reapply every 2 hours as needed.  - Call if any new or changing lesions are noted between office visits  - R infra orbital, R lat infraorbital  Lentigines - Scattered tan macules - Due to sun exposure - Benign-appearing, observe - Recommend daily broad spectrum sunscreen SPF 30+ to sun-exposed areas, reapply every 2 hours as needed. - Call for any changes - arms,   Seborrheic Keratoses - Stuck-on, waxy, tan-brown papules and/or plaques  - Benign-appearing - Discussed  benign etiology and prognosis. - Observe - Call for any changes - back, chest, abdomen  Melanocytic Nevi - Tan-brown and/or pink-flesh-colored symmetric macules and papules - Benign appearing on exam today - Observation - Call clinic for new or changing moles - Recommend daily use of broad spectrum spf 30+ sunscreen to sun-exposed areas.  - back, upper calf, face  Hemangiomas - Red papules - Discussed benign nature - Observe - Call for any changes - back, chest, abdomen  Actinic Damage - Chronic condition, secondary to cumulative UV/sun exposure - diffuse scaly erythematous macules with underlying dyspigmentation - Recommend daily broad spectrum sunscreen SPF 30+ to sun-exposed areas, reapply every 2 hours as needed.  - Staying in the shade or wearing long sleeves, sun glasses (UVA+UVB protection) and wide brim hats (4-inch brim around the entire circumference of the hat) are also recommended for sun protection.  - Call for new or changing lesions. - chest  Sebaceous Hyperplasia - Small yellow papules with a central dell - Benign - Observe   Skin cancer screening performed today.  Fibrous papule of face L nasal ala  Vs Milia  Benign-appearing. Stable compared to previous visit. Observation.  Call clinic for new or changing moles.  Recommend daily use of broad spectrum spf 30+ sunscreen to sun-exposed areas.    Excoriation Right Thigh - Anterior  Start Mupirocin oint qd/bid to open sores on legs   Return in about 1 year (around 07/26/2023) for TBSE, Hx of BCC, hx of Atypical melanocytic proliferation, hx of AK.  I, Sonya Hupman, RMA, am acting as  scribe for Brendolyn Patty, MD .  Documentation: I have reviewed the above documentation for accuracy and completeness, and I agree with the above.  Brendolyn Patty MD

## 2022-07-25 NOTE — Patient Instructions (Signed)
Due to recent changes in healthcare laws, you may see results of your pathology and/or laboratory studies on MyChart before the doctors have had a chance to review them. We understand that in some cases there may be results that are confusing or concerning to you. Please understand that not all results are received at the same time and often the doctors may need to interpret multiple results in order to provide you with the best plan of care or course of treatment. Therefore, we ask that you please give us 2 business days to thoroughly review all your results before contacting the office for clarification. Should we see a critical lab result, you will be contacted sooner.   If You Need Anything After Your Visit  If you have any questions or concerns for your doctor, please call our main line at [phone] and press option 4 to reach your doctor's medical assistant. If no one answers, please leave a voicemail as directed and we will return your call as soon as possible. Messages left after 4 pm will be answered the following business day.   You may also send us a message via MyChart. We typically respond to MyChart messages within 1-2 business days.  For prescription refills, please ask your pharmacy to contact our office. Our fax number is [phone].  If you have an urgent issue when the clinic is closed that cannot wait until the next business day, you can page your doctor at the number below.    Please note that while we do our best to be available for urgent issues outside of office hours, we are not available 24/7.   If you have an urgent issue and are unable to reach us, you may choose to seek medical care at your doctor's office, retail clinic, urgent care center, or emergency room.  If you have a medical emergency, please immediately call 911 or go to the emergency department.  Pager Numbers  - Dr. Kowalski: [phone]  - Dr. Moye: [phone]  - Dr. Stewart:  [phone]  In the event of inclement weather, please call our main line at [phone] for an update on the status of any delays or closures.  Dermatology Medication Tips: Please keep the boxes that topical medications come in in order to help keep track of the instructions about where and how to use these. Pharmacies typically print the medication instructions only on the boxes and not directly on the medication tubes.   If your medication is too expensive, please contact our office at [phone] option 4 or send us a message through MyChart.   We are unable to tell what your co-pay for medications will be in advance as this is different depending on your insurance coverage. However, we may be able to find a substitute medication at lower cost or fill out paperwork to get insurance to cover a needed medication.   If a prior authorization is required to get your medication covered by your insurance company, please allow us 1-2 business days to complete this process.  Drug prices often vary depending on where the prescription is filled and some pharmacies may offer cheaper prices.  The website www.goodrx.com contains coupons for medications through different pharmacies. The prices here do not account for what the cost may be with help from insurance (it may be cheaper with your insurance), but the website can give you the price if you did not use any insurance.  - You can print the associated coupon and take it with   your prescription to the pharmacy.  - You may also stop by our office during regular business hours and pick up a GoodRx coupon card.  - If you need your prescription sent electronically to a different pharmacy, notify our office through [place] MyChart or by phone at [phone] option 4.     Si Usted Necesita Algo Despus de Su Visita  Tambin puede enviarnos un mensaje a travs de MyChart. Por lo general respondemos a los mensajes de MyChart en el transcurso de 1 a 2  das hbiles.  Para renovar recetas, por favor pida a su farmacia que se ponga en contacto con nuestra oficina. Nuestro nmero de fax es el [phone].  Si tiene un asunto urgente cuando la clnica est cerrada y que no puede esperar hasta el siguiente da hbil, puede llamar/localizar a su doctor(a) al nmero que aparece a continuacin.   Por favor, tenga en cuenta que aunque hacemos todo lo posible para estar disponibles para asuntos urgentes fuera del horario de oficina, no estamos disponibles las 24 horas del da, los 7 das de la semana.   Si tiene un problema urgente y no puede comunicarse con nosotros, puede optar por buscar atencin mdica  en el consultorio de su doctor(a), en una clnica privada, en un centro de atencin urgente o en una sala de emergencias.  Si tiene una emergencia mdica, por favor llame inmediatamente al 911 o vaya a la sala de emergencias.  Nmeros de bper  - Dr. Kowalski: [phone]  - Dra. Moye: [phone]  - Dra. Stewart: [phone]  En caso de inclemencias del tiempo, por favor llame a nuestra lnea principal al [phone] para una actualizacin sobre el estado de cualquier retraso o cierre.  Consejos para la medicacin en dermatologa: Por favor, guarde las cajas en las que vienen los medicamentos de uso tpico para ayudarle a seguir las instrucciones sobre dnde y cmo usarlos. Las farmacias generalmente imprimen las instrucciones del medicamento slo en las cajas y no directamente en los tubos del medicamento.   Si su medicamento es muy caro, por favor, pngase en contacto con nuestra oficina llamando al [phone] y presione la opcin 4 o envenos un mensaje a travs de MyChart.   No podemos decirle cul ser su copago por los medicamentos por adelantado ya que esto es diferente dependiendo de la cobertura de su seguro. Sin embargo, es posible que podamos encontrar un medicamento sustituto a menor costo o llenar un formulario para que el  seguro cubra el medicamento que se considera necesario.   Si se requiere una autorizacin previa para que su compaa de seguros cubra su medicamento, por favor permtanos de 1 a 2 das hbiles para completar este proceso.  Los precios de los medicamentos varan con frecuencia dependiendo del lugar de dnde se surte la receta y alguna farmacias pueden ofrecer precios ms baratos.  El sitio web www.goodrx.com tiene cupones para medicamentos de diferentes farmacias. Los precios aqu no tienen en cuenta lo que podra costar con la ayuda del seguro (puede ser ms barato con su seguro), pero el sitio web puede darle el precio si no utiliz ningn seguro.  - Puede imprimir el cupn correspondiente y llevarlo con su receta a la farmacia.  - Tambin puede pasar por nuestra oficina durante el horario de atencin regular y recoger una tarjeta de cupones de GoodRx.  - Si necesita que su receta se enve electrnicamente a una farmacia diferente, informe a nuestra oficina a travs de MyChart de [place]   o por telfono llamando al [phone] y presione la opcin 4.  

## 2022-08-04 DIAGNOSIS — M3501 Sicca syndrome with keratoconjunctivitis: Secondary | ICD-10-CM | POA: Diagnosis not present

## 2022-08-19 ENCOUNTER — Encounter: Payer: Self-pay | Admitting: Family Medicine

## 2022-08-19 ENCOUNTER — Ambulatory Visit (INDEPENDENT_AMBULATORY_CARE_PROVIDER_SITE_OTHER): Payer: Medicare Other | Admitting: Family Medicine

## 2022-08-19 VITALS — BP 141/81 | HR 84 | Temp 98.2°F | Ht 62.0 in | Wt 174.6 lb

## 2022-08-19 DIAGNOSIS — F331 Major depressive disorder, recurrent, moderate: Secondary | ICD-10-CM | POA: Diagnosis not present

## 2022-08-19 DIAGNOSIS — F22 Delusional disorders: Secondary | ICD-10-CM | POA: Diagnosis not present

## 2022-08-19 DIAGNOSIS — R52 Pain, unspecified: Secondary | ICD-10-CM

## 2022-08-19 MED ORDER — QUETIAPINE FUMARATE 25 MG PO TABS: 25.0000 mg | ORAL_TABLET | Freq: Every day | ORAL | 1 refills | Status: DC

## 2022-08-19 NOTE — Progress Notes (Signed)
BP (!) 141/81 (BP Location: Left Arm, Cuff Size: Normal)   Pulse 84   Temp 98.2 F (36.8 C) (Oral)   Ht _0  (1.575 m)   Wt 174 lb 9.6 oz (79.2 kg)   LMP  (LMP Unknown)   SpO2 96%   BMI 31.93 kg/m    Subjective:    Patient ID: Cynthia Vaughan, female    DOB: 02-12-1941, 81 y.o.   MRN: [id number]  HPI: Cynthia Vaughan is a 81 y.o. female  Chief Complaint  Patient presents with   Depression   body pain   ANXIETY/DEPRESSION Duration: chronic Status:better Anxious mood: yes  Excessive worrying: no Irritability: no  Sweating: no Nausea: no Palpitations:no Hyperventilation: no Panic attacks: no Agoraphobia: no  Obscessions/compulsions: yes- thinks that there are bugs under her skin Depressed mood: yes    08/19/2022    2:51 PM 07/22/2022    2:41 PM 06/20/2022   11:40 AM 06/10/2022    2:30 PM 05/27/2022    2:40 PM  Depression screen PHQ 2/9  Decreased Interest _1 Down, Depressed, Hopeless 1 1 0 2 1  PHQ - 2 Score _2 Altered sleeping 1 0 0 2 1  Tired, decreased energy _3 Change in appetite 2 1 0 2 2  Feeling bad or failure about yourself  1 2 0 2 1  Trouble concentrating 0 0 0 1 1  Moving slowly or fidgety/restless 0 0 0 0 0  Suicidal thoughts 0 0 0 0 0  PHQ-9 Score _4 Difficult doing work/chores Somewhat difficult Somewhat difficult Somewhat difficult Very difficult Somewhat difficult   Anhedonia: no Weight changes: no Insomnia: no   Hypersomnia: no Fatigue/loss of energy: no Feelings of worthlessness: no Feelings of guilt: no Impaired concentration/indecisiveness: no Suicidal ideations: no  Crying spells: no Recent Stressors/Life Changes: no   Relationship problems: no   Family stress: no     Financial stress: no    Job stress: no    Recent death/loss: no  Pain is better. Feeling more comfortable.   Relevant past medical, surgical, family and social history reviewed and updated as indicated. Interim medical  history since our last visit reviewed. Allergies and medications reviewed and updated.  Review of Systems  Constitutional: Negative.   Respiratory: Negative.    Cardiovascular: Negative.   Gastrointestinal: Negative.   Musculoskeletal: Negative.   Neurological: Negative.   Hematological: Negative.   Psychiatric/Behavioral: Negative.      Per HPI unless specifically indicated above     Objective:    BP (!) 141/81 (BP Location: Left Arm, Cuff Size: Normal)   Pulse 84   Temp 98.2 F (36.8 C) (Oral)   Ht _5  (1.575 m)   Wt 174 lb 9.6 oz (79.2 kg)   LMP  (LMP Unknown)   SpO2 96%   BMI 31.93 kg/m   Wt Readings from Last 3 Encounters:  08/19/22 174 lb 9.6 oz (79.2 kg)  07/22/22 172 lb 8 oz (78.2 kg)  06/10/22 167 lb 3.2 oz (75.8 kg)    Physical Exam Vitals and nursing note reviewed.  Constitutional:      General: She is not in acute distress.    Appearance: Normal appearance. She is normal weight. She is not ill-appearing, toxic-appearing or diaphoretic.  HENT:     Head: Normocephalic and atraumatic.     Right Ear: External ear normal.  Left Ear: External ear normal.     Nose: Nose normal.     Mouth/Throat:     Mouth: Mucous membranes are moist.     Pharynx: Oropharynx is clear.  Eyes:     General: No scleral icterus.       Right eye: No discharge.        Left eye: No discharge.     Extraocular Movements: Extraocular movements intact.     Conjunctiva/sclera: Conjunctivae normal.     Pupils: Pupils are equal, round, and reactive to light.  Cardiovascular:     Rate and Rhythm: Normal rate and regular rhythm.     Pulses: Normal pulses.     Heart sounds: Normal heart sounds. No murmur heard.    No friction rub. No gallop.  Pulmonary:     Effort: Pulmonary effort is normal. No respiratory distress.     Breath sounds: Normal breath sounds. No stridor. No wheezing, rhonchi or rales.  Chest:     Chest wall: No tenderness.  Musculoskeletal:        General: Normal  range of motion.     Cervical back: Normal range of motion and neck supple.  Skin:    General: Skin is warm and dry.     Capillary Refill: Capillary refill takes less than 2 seconds.     Coloration: Skin is not jaundiced or pale.     Findings: No bruising, erythema, lesion or rash.  Neurological:     General: No focal deficit present.     Mental Status: She is alert and oriented to person, place, and time. Mental status is at baseline.  Psychiatric:        Mood and Affect: Mood normal.        Behavior: Behavior normal.        Thought Content: Thought content normal.        Judgment: Judgment normal.     Results for orders placed or performed in visit on 05/27/22  Comprehensive metabolic panel  Result Value Ref Range   Glucose 102 (H) 70 - 99 mg/dL   BUN 17 8 - 27 mg/dL   Creatinine, Ser 1.03 (H) 0.57 - 1.00 mg/dL   eGFR 55 (L) >59 mL/min/1.73   BUN/Creatinine Ratio 17 12 - 28   Sodium 141 134 - 144 mmol/L   Potassium 4.3 3.5 - 5.2 mmol/L   Chloride 102 96 - 106 mmol/L   CO2 24 20 - 29 mmol/L   Calcium 9.5 8.7 - 10.3 mg/dL   Total Protein 6.7 6.0 - 8.5 g/dL   Albumin 4.0 3.8 - 4.8 g/dL   Globulin, Total 2.7 1.5 - 4.5 g/dL   Albumin/Globulin Ratio 1.5 1.2 - 2.2   Bilirubin Total <0.2 0.0 - 1.2 mg/dL   Alkaline Phosphatase 73 44 - 121 IU/L   AST 20 0 - 40 IU/L   ALT 21 0 - 32 IU/L  CBC with Differential/Platelet  Result Value Ref Range   WBC 7.5 3.4 - 10.8 x10E3/uL   RBC 4.12 3.77 - 5.28 x10E6/uL   Hemoglobin 13.1 11.1 - 15.9 g/dL   Hematocrit 38.1 34.0 - 46.6 %   MCV 93 79 - 97 fL   MCH 31.8 26.6 - 33.0 pg   MCHC 34.4 31.5 - 35.7 g/dL   RDW 12.4 11.7 - 15.4 %   Platelets 231 150 - 450 x10E3/uL   Neutrophils 52 Not Estab. %   Lymphs 31 Not Estab. %   Monocytes 11 Not  Estab. %   Eos 5 Not Estab. %   Basos 0 Not Estab. %   Neutrophils Absolute 3.9 1.4 - 7.0 x10E3/uL   Lymphocytes Absolute 2.3 0.7 - 3.1 x10E3/uL   Monocytes Absolute 0.8 0.1 - 0.9 x10E3/uL   EOS  (ABSOLUTE) 0.4 0.0 - 0.4 x10E3/uL   Basophils Absolute 0.0 0.0 - 0.2 x10E3/uL   Immature Granulocytes 1 Not Estab. %   Immature Grans (Abs) 0.0 0.0 - 0.1 x10E3/uL  TSH  Result Value Ref Range   TSH 4.040 0.450 - 4.500 uIU/mL  Lyme Disease Serology w/Reflex  Result Value Ref Range   Lyme Total Antibody EIA Negative Negative  Rocky mtn spotted fvr abs pnl(IgG+IgM)  Result Value Ref Range   RMSF IgG Negative Negative   RMSF IgM 0.26 0.00 - 0.07 index  Ehrlichia Antibody Panel  Result Value Ref Range   E.Chaffeensis (HME) IgG Negative Neg:<1:64   E. Chaffeensis (HME) IgM Titer Negative Neg:<1:20   HGE IgG Titer Negative Neg:<1:64   HGE IgM Titer Negative Neg:<1:20   Result Comment: Comment   Babesia microti Antibody Panel  Result Value Ref Range   Babesia microti IgG <1:10 Neg:<1:10   Babesia microti IgM <1:10 Neg:<1:10   Result Comment: Comment       Assessment & Plan:   Problem List Items Addressed This Visit       Other   MDD (major depressive disorder), recurrent episode, moderate ([place]) - Primary    Under good control on current regimen. Continue current regimen. Continue to monitor. Call with any concerns. Refills given.        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 Call with any concerns.       Other Visit Diagnoses     Total body pain       Better. Pain is improved. [place]ine to monitor. Call with any concerns.        Follow up plan: Return in about 2 months (around 10/20/2022).

## 2022-08-19 NOTE — Assessment & Plan Note (Signed)
Under good control on current regimen. Continue current regimen. Continue to monitor. Call with any concerns. Refills given.   

## 2022-08-19 NOTE — Assessment & Plan Note (Signed)
Will start low dose seroquel at bedtime. Call with any concerns.

## 2022-08-20 ENCOUNTER — Other Ambulatory Visit: Payer: Self-pay | Admitting: Family Medicine

## 2022-08-20 NOTE — Telephone Encounter (Signed)
Requested Prescriptions  Pending Prescriptions Disp Refills   fluticasone (FLONASE) 50 MCG/ACT nasal spray [Pharmacy Med Name: Fluticasone Propionate 50mg/actuation Nasal Spray] 48 g 1    Sig: Instill 2 sprays into each nostril daily.     Ear, Nose, and Throat: Nasal Preparations - Corticosteroids Passed - 08/20/2022  7:15 AM      Passed - Valid encounter within last 12 months    Recent Outpatient Visits           Yesterday MDD (major depressive disorder), recurrent episode, moderate [place]   [facility]   4 weeks ago MDD (major depressive disorder), recurrent episode, moderate ([place]   [place], [place]   2 months ago MDD (major depressive disorder), recurrent episode, moderate [place]   [facility], [place]   2 months ago Total body pain   [place], [place]   3 months ago MDD (major depressive disorder), recurrent episode, moderate ([place]   [facility] [place]       Future Appointments             In 2 months J[place]yn[place] Emery MBarb Merino DO [facility] [place]  In 11 months SBrendolyn Patty MD [place]

## 2022-09-29 ENCOUNTER — Encounter: Payer: Self-pay | Admitting: Physician Assistant

## 2022-09-29 ENCOUNTER — Ambulatory Visit (INDEPENDENT_AMBULATORY_CARE_PROVIDER_SITE_OTHER): Payer: Medicare Other | Admitting: Physician Assistant

## 2022-09-29 VITALS — BP 128/80 | HR 107 | Temp 98.5°F | Ht 61.89 in | Wt 160.2 lb

## 2022-09-29 DIAGNOSIS — R531 Weakness: Secondary | ICD-10-CM | POA: Diagnosis not present

## 2022-09-29 DIAGNOSIS — R110 Nausea: Secondary | ICD-10-CM | POA: Diagnosis not present

## 2022-09-29 DIAGNOSIS — K921 Melena: Secondary | ICD-10-CM

## 2022-09-29 DIAGNOSIS — R630 Anorexia: Secondary | ICD-10-CM

## 2022-09-29 MED ORDER — ONDANSETRON HCL 4 MG PO TABS: 4.0000 mg | ORAL_TABLET | Freq: Three times a day (TID) | ORAL | 0 refills | Status: DC | PRN

## 2022-09-29 NOTE — Patient Instructions (Addendum)
You can use Preparation H to help with the hemorrhoids and rectal pain  Please return the stool testing once it is completed  I have sent in a script for nausea to help with oral intake   Please make sure you are drinking plenty of water and you can use Pedialyte as well

## 2022-09-29 NOTE — Progress Notes (Signed)
Acute Office Visit   Patient: Cynthia Vaughan   DOB: Dec 20, 1940   82 y.o. Female  MRN: [id number] Visit Date: 09/29/2022  Today's healthcare provider: Dani Gobble Cyra Spader, PA-C  Introduced myself to the patient as a Journalist, newspaper and provided education on APPs in clinical practice.    Chief Complaint  Patient presents with   Nausea    All the time, started about a week or 2 ago   Not Eating    For past 2 weeks.    Blood In Stools     For past 2 days    Subjective    HPI HPI     Nausea    Additional comments: All the time, started about a week or 2 ago        Not Eating    Additional comments: For past 2 weeks.         Blood In Stools    Additional comments:  For past 2 days       Last edited by Jerelene Redden, CMA on 09/29/2022  1:57 PM.      She is here with her husband who is helping with HPI   Nausea and reduced oral intake   Onset: gradual  Duration: several weeks  Vomiting: once about 2 weeks ago  Reports they were sick with GI viral illness a few weeks ago  Able to tolerate liquids: she has been tolerating protein shakes  Interventions: nothing  She reports severely decreased appetite  Alleviating: nothing  Aggravating:  nothing  She has lost 14 lbs since she was last seen in Dec   Blood in Stools:  Onset: sudden  Duration: 2 days  She had been taking Advil for a few days but her husband stopped her from taking this last week  Appearance: she reports it was "blood red" , denies tar or black stools  Pain with defecation: yes felt like something cut her  Diarrhea: no  Constipation: no  Weakness: yes  Fatigue: yes, all the time. Her husband states she is staying in bed almost all day  Dizziness: yes   Her husband states she seems to catch a chill every once in awhile    Medications: Outpatient Medications Prior to Visit  Medication Sig   acetaminophen (TYLENOL) 500 MG tablet Take 500 mg by mouth every 6 (six) hours as needed.    amLODipine (NORVASC) 2.5 MG tablet Take 1 tablet (2.5 mg total) by mouth daily.   aspirin EC 81 MG tablet Take 81 mg by mouth daily.   atorvastatin (LIPITOR) 40 MG tablet Take 40 mg by mouth daily.   Calcium Carb-Cholecalciferol (CALCIUM 600 + D PO) Take by mouth daily.   donepezil (ARICEPT) 10 MG tablet Take 10 mg by mouth at bedtime.   DULoxetine (CYMBALTA) 60 MG capsule Take 1 capsule (60 mg total) by mouth daily.   fluticasone (FLONASE) 50 MCG/ACT nasal spray Instill 2 sprays into each nostril daily.   gabapentin (NEURONTIN) 300 MG capsule Take 2 capsules (600 mg total) by mouth 2 (two) times daily.   loratadine (CLARITIN) 10 MG tablet Take 1 tablet (10 mg total) by mouth daily.   losartan (COZAAR) 50 MG tablet Take 1 tablet (50 mg total) by mouth daily.   memantine (NAMENDA) 5 MG tablet Take 5 mg by mouth daily.   omeprazole (PRILOSEC) 20 MG capsule Take 1 capsule (20 mg total) by mouth daily.   QUEtiapine (SEROQUEL) 25  MG tablet Take 1 tablet (25 mg total) by mouth at bedtime.   traMADol (ULTRAM) 50 MG tablet Take 50 mg by mouth 2 (two) times daily as needed.   traZODone (DESYREL) 50 MG tablet Take 25 mg by mouth at bedtime as needed.   mupirocin ointment (BACTROBAN) 2 % Apply 1 Application topically daily. Qd to excoriations on thighs (Patient not taking: Reported on 09/29/2022)   No facility-administered medications prior to visit.    Review of Systems  Constitutional:  Positive for appetite change and fatigue. Negative for chills and fever.  Respiratory:  Negative for cough, shortness of breath and wheezing.   Gastrointestinal:  Positive for blood in stool and nausea.  Neurological:  Positive for dizziness, weakness and light-headedness.       Objective    BP 128/80   Pulse (!) 107   Temp 98.5 F (36.9 C) (Oral)   Ht 5' 1.89" (1.572 m)   Wt 160 lb 3.2 oz (72.7 kg)   LMP  (LMP Unknown)   SpO2 94%   BMI 29.41 kg/m    Physical Exam Vitals reviewed.  Constitutional:       General: She is awake.     Appearance: Normal appearance. She is well-developed and well-groomed. She is ill-appearing.  HENT:     Head: Normocephalic and atraumatic.  Cardiovascular:     Rate and Rhythm: Normal rate and regular rhythm.     Pulses:          Radial pulses are 1+ on the right side and 1+ on the left side.     Heart sounds: Normal heart sounds. No murmur heard.    No friction rub. No gallop.  Pulmonary:     Effort: Pulmonary effort is normal.     Breath sounds: Normal breath sounds. No decreased air movement. No decreased breath sounds, wheezing, rhonchi or rales.  Genitourinary:    Rectum: Tenderness, external hemorrhoid and internal hemorrhoid present.  Musculoskeletal:     Cervical back: Normal range of motion.  Skin:    General: Skin is cool and dry.     Coloration: Skin is pale.  Neurological:     General: No focal deficit present.     Mental Status: She is lethargic.     GCS: GCS eye subscore is 4. GCS verbal subscore is 5. GCS motor subscore is 6.     Cranial Nerves: No dysarthria or facial asymmetry.     Motor: Tremor (Pill rolling tremor noted throughout apt) present.  Psychiatric:        Behavior: Behavior is cooperative.        Results for orders placed or performed in visit on 09/29/22  CBC w/Diff  Result Value Ref Range   WBC 7.2 3.4 - 10.8 x10E3/uL   RBC 4.59 3.77 - 5.28 x10E6/uL   Hemoglobin 14.3 11.1 - 15.9 g/dL   Hematocrit 42.2 34.0 - 46.6 %   MCV 92 79 - 97 fL   MCH 31.2 26.6 - 33.0 pg   MCHC 33.9 31.5 - 35.7 g/dL   RDW 12.2 11.7 - 15.4 %   Platelets 268 150 - 450 x10E3/uL   Neutrophils 56 Not Estab. %   Lymphs 29 Not Estab. %   Monocytes 9 Not Estab. %   Eos 5 Not Estab. %   Basos 1 Not Estab. %   Neutrophils Absolute 4.1 1.4 - 7.0 x10E3/uL   Lymphocytes Absolute 2.1 0.7 - 3.1 x10E3/uL   Monocytes Absolute 0.6 0.1 -  0.9 x10E3/uL   EOS (ABSOLUTE) 0.3 0.0 - 0.4 x10E3/uL   Basophils Absolute 0.0 0.0 - 0.2 x10E3/uL   Immature  Granulocytes 0 Not Estab. %   Immature Grans (Abs) 0.0 0.0 - 0.1 x10E3/uL  Comp Met (CMET)  Result Value Ref Range   Glucose 118 (H) 70 - 99 mg/dL   BUN 12 8 - 27 mg/dL   Creatinine, Ser 0.86 0.57 - 1.00 mg/dL   eGFR 68 >59 mL/min/1.73   BUN/Creatinine Ratio 14 12 - 28   Sodium 138 134 - 144 mmol/L   Potassium 4.3 3.5 - 5.2 mmol/L   Chloride 99 96 - 106 mmol/L   CO2 23 20 - 29 mmol/L   Calcium 9.9 8.7 - 10.3 mg/dL   Total Protein 7.0 6.0 - 8.5 g/dL   Albumin 4.3 3.8 - 4.8 g/dL   Globulin, Total 2.7 1.5 - 4.5 g/dL   Albumin/Globulin Ratio 1.6 1.2 - 2.2   Bilirubin Total 0.2 0.0 - 1.2 mg/dL   Alkaline Phosphatase 72 44 - 121 IU/L   AST 22 0 - 40 IU/L   ALT 18 0 - 32 IU/L  TSH  Result Value Ref Range   TSH 2.160 0.450 - 4.500 uIU/mL    Assessment & Plan     Problem List Items Addressed This Visit       Other   Weakness generalized - Primary    Appears to be acute concern per husband  Unsure if this is related to decreased PO intake due to decreased appetite vs other cause She reports several instances of frank blood in stool for the past 2 days and reports pain with defecation  Will check CMP, CBC and TSH for potential etiology and send home with fecal occult testing kit  Results to dictate further management For now recommend she stay well hydrated with plenty of water, can use electrolyte replacement drinks as well for further hydration and recommend allowing several protein shakes throughout the day if she tolerates this  Recommend follow up in 2-4 weeks with PCP to evaluate for progression/improvement        Relevant Orders   CBC w/Diff (Completed)   Comp Met (CMET) (Completed)   Decreased appetite    Unsure of chronicity, ongoing Patient and her husband report nausea and associated decreased appetite- unsure if appetite is due to nausea or secondary to dementia  Will provide Zofran 4 mg PO TID PRN to assist with nausea Discussed this with her PCP and we may need  to add low dose of Remeron to regimen to assist with appetite  Review of previous office visit demonstrates 14 lb weight loss since Dec Will check CMP for dehydration given decreased PO intake Recommend follow up in about 2-4 weeks to see if appetite is improving      Relevant Orders   TSH (Completed)   Other Visit Diagnoses     Blood in stool, frank     Acute, new concern per patient who is admittedly a poor historian due to dementia She reports several episodes of frank blood in stool with rectal pain during defecation over the past 2 days PE revealed several external hemorrhoids and tenderness to rectal area during exam consistent with likely inflamed hemorrhoids Reviewed using Preparation H to assist with symptoms and reduction of inflammation Will send them home with fecal occult testing kit and check CBC for potential GI bleed given her chronic dx  Follow up in 2-4 weeks with PCP pending results and  indicated managemetn    Relevant Orders   CBC w/Diff (Completed)   Comp Met (CMET) (Completed)   Fecal occult blood, imunochemical   Nausea       Relevant Medications   ondansetron (ZOFRAN) 4 MG tablet        Return in about 4 weeks (around 10/27/2022) for weight loss, loss of appetite, nausea, .   I, Kyrian Stage E Carrol Hougland, PA-C, have reviewed all documentation for this visit. The documentation on 09/30/22 for the exam, diagnosis, procedures, and orders are all accurate and complete.   Talitha Givens, MHS, PA-C [facility]   Return in about 4 weeks (around 10/27/2022) for weight loss, loss of appetite, nausea, .

## 2022-09-30 DIAGNOSIS — R630 Anorexia: Secondary | ICD-10-CM | POA: Insufficient documentation

## 2022-09-30 DIAGNOSIS — R531 Weakness: Secondary | ICD-10-CM | POA: Insufficient documentation

## 2022-09-30 LAB — COMPREHENSIVE METABOLIC PANEL
ALT: 18 IU/L (ref 0–32)
AST: 22 IU/L (ref 0–40)
Albumin/Globulin Ratio: 1.6 (ref 1.2–2.2)
Albumin: 4.3 g/dL (ref 3.8–4.8)
Alkaline Phosphatase: 72 IU/L (ref 44–121)
BUN/Creatinine Ratio: 14 (ref 12–28)
BUN: 12 mg/dL (ref 8–27)
Bilirubin Total: 0.2 mg/dL (ref 0.0–1.2)
CO2: 23 mmol/L (ref 20–29)
Calcium: 9.9 mg/dL (ref 8.7–10.3)
Chloride: 99 mmol/L (ref 96–106)
Creatinine, Ser: 0.86 mg/dL (ref 0.57–1.00)
Globulin, Total: 2.7 g/dL (ref 1.5–4.5)
Glucose: 118 mg/dL — ABNORMAL HIGH (ref 70–99)
Potassium: 4.3 mmol/L (ref 3.5–5.2)
Sodium: 138 mmol/L (ref 134–144)
Total Protein: 7 g/dL (ref 6.0–8.5)
eGFR: 68 mL/min/{1.73_m2} (ref >59)

## 2022-09-30 LAB — CBC WITH DIFFERENTIAL/PLATELET
Basophils Absolute: 0 10*3/uL (ref 0.0–0.2)
Basos: 1 %
EOS (ABSOLUTE): 0.3 10*3/uL (ref 0.0–0.4)
Eos: 5 %
Hematocrit: 42.2 % (ref 34.0–46.6)
Hemoglobin: 14.3 g/dL (ref 11.1–15.9)
Immature Grans (Abs): 0 10*3/uL (ref 0.0–0.1)
Immature Granulocytes: 0 %
Lymphocytes Absolute: 2.1 10*3/uL (ref 0.7–3.1)
Lymphs: 29 %
MCH: 31.2 pg (ref 26.6–33.0)
MCHC: 33.9 g/dL (ref 31.5–35.7)
MCV: 92 fL (ref 79–97)
Monocytes Absolute: 0.6 10*3/uL (ref 0.1–0.9)
Monocytes: 9 %
Neutrophils Absolute: 4.1 10*3/uL (ref 1.4–7.0)
Neutrophils: 56 %
Platelets: 268 10*3/uL (ref 150–450)
RBC: 4.59 x10E6/uL (ref 3.77–5.28)
RDW: 12.2 % (ref 11.7–15.4)
WBC: 7.2 10*3/uL (ref 3.4–10.8)

## 2022-09-30 LAB — TSH: TSH: 2.16 u[IU]/mL (ref 0.450–4.500)

## 2022-09-30 NOTE — Assessment & Plan Note (Signed)
Unsure of chronicity, ongoing Patient and her husband report nausea and associated decreased appetite- unsure if appetite is due to nausea or secondary to dementia  Will provide Zofran 4 mg PO TID PRN to assist with nausea Discussed this with her PCP and we may need to add low dose of Remeron to regimen to assist with appetite  Review of previous office visit demonstrates 14 lb weight loss since Dec Will check CMP for dehydration given decreased PO intake Recommend follow up in about 2-4 weeks to see if appetite is improving

## 2022-09-30 NOTE — Assessment & Plan Note (Signed)
Appears to be acute concern per husband  Unsure if this is related to decreased PO intake due to decreased appetite vs other cause She reports several instances of frank blood in stool for the past 2 days and reports pain with defecation  Will check CMP, CBC and TSH for potential etiology and send home with fecal occult testing kit  Results to dictate further management For now recommend she stay well hydrated with plenty of water, can use electrolyte replacement drinks as well for further hydration and recommend allowing several protein shakes throughout the day if she tolerates this  Recommend follow up in 2-4 weeks with PCP to evaluate for progression/improvement

## 2022-09-30 NOTE — Progress Notes (Signed)
Your labs did not show signs of anemia or electrolyte abnormality at this time which is good. I spoke with Dr. Wynetta Emery and it may be time to add a medication to help with your appetite. This is called Remeron and is taken at bedtime as it can cause sedation. A low dose of this medication usually improves appetite in older patients. Let us know if you would me to send this in and please keep your follow up apts with Dr. Wynetta Emery.

## 2022-10-11 DIAGNOSIS — R413 Other amnesia: Secondary | ICD-10-CM | POA: Diagnosis not present

## 2022-10-11 DIAGNOSIS — R251 Tremor, unspecified: Secondary | ICD-10-CM | POA: Diagnosis not present

## 2022-10-11 DIAGNOSIS — G479 Sleep disorder, unspecified: Secondary | ICD-10-CM | POA: Diagnosis not present

## 2022-10-20 ENCOUNTER — Encounter: Payer: Self-pay | Admitting: Family Medicine

## 2022-10-20 ENCOUNTER — Ambulatory Visit (INDEPENDENT_AMBULATORY_CARE_PROVIDER_SITE_OTHER): Payer: Medicare Other | Admitting: Family Medicine

## 2022-10-20 VITALS — BP 137/87 | HR 101 | Temp 98.7°F | Ht 61.0 in | Wt 156.1 lb

## 2022-10-20 DIAGNOSIS — I129 Hypertensive chronic kidney disease with stage 1 through stage 4 chronic kidney disease, or unspecified chronic kidney disease: Secondary | ICD-10-CM | POA: Diagnosis not present

## 2022-10-20 DIAGNOSIS — E559 Vitamin D deficiency, unspecified: Secondary | ICD-10-CM | POA: Diagnosis not present

## 2022-10-20 DIAGNOSIS — E782 Mixed hyperlipidemia: Secondary | ICD-10-CM

## 2022-10-20 DIAGNOSIS — F419 Anxiety disorder, unspecified: Secondary | ICD-10-CM

## 2022-10-20 DIAGNOSIS — F331 Major depressive disorder, recurrent, moderate: Secondary | ICD-10-CM

## 2022-10-20 MED ORDER — LOSARTAN POTASSIUM 50 MG PO TABS: 50.0000 mg | ORAL_TABLET | Freq: Every day | ORAL | 1 refills | Status: DC

## 2022-10-20 MED ORDER — OMEPRAZOLE 20 MG PO CPDR: 20.0000 mg | DELAYED_RELEASE_CAPSULE | Freq: Every day | ORAL | 1 refills | Status: DC

## 2022-10-20 MED ORDER — AMLODIPINE BESYLATE 2.5 MG PO TABS: 2.5000 mg | ORAL_TABLET | Freq: Every day | ORAL | 1 refills | Status: DC

## 2022-10-20 MED ORDER — GABAPENTIN 300 MG PO CAPS: 600.0000 mg | ORAL_CAPSULE | Freq: Two times a day (BID) | ORAL | 1 refills | Status: DC

## 2022-10-20 MED ORDER — ATORVASTATIN CALCIUM 40 MG PO TABS: 40.0000 mg | ORAL_TABLET | Freq: Every day | ORAL | 1 refills | Status: DC

## 2022-10-20 NOTE — Progress Notes (Signed)
BP 137/87   Pulse (!) 101   Temp 98.7 F (37.1 C) (Oral)   Ht '5\' 1"'$  (1.549 m)   Wt 156 lb 1.6 oz (70.8 kg)   LMP  (LMP Unknown)   SpO2 94%   BMI 29.49 kg/m    Subjective:    Patient ID: Cynthia Vaughan, female    DOB: [birth year], 82 y.o.   MRN: [id number]  HPI: Cynthia Vaughan is a 82 y.o. female  Chief Complaint  Patient presents with   Anxiety   Hypertension   Hyperlipidemia   ANXIETY/STRESS Duration: Chronic Status:better Anxious mood: yes  Excessive worrying: yes Irritability: no  Sweating: no Nausea: no Palpitations:no Hyperventilation: no Panic attacks: no Agoraphobia: no  Obscessions/compulsions: no Depressed mood: yes    10/20/2022    2:51 PM 09/29/2022    2:06 PM 08/19/2022    2:51 PM 07/22/2022    2:41 PM 06/20/2022   11:40 AM  Depression screen PHQ 2/9  Decreased Interest '2 3 2 1 3  '$ Down, Depressed, Hopeless '1 3 1 1 '$ 0  PHQ - 2 Score '3 6 3 2 3  '$ Altered sleeping '2 3 1 '$ 0 0  Tired, decreased energy '2 3 2 2 2  '$ Change in appetite '3 3 2 1 '$ 0  Feeling bad or failure about yourself  '1 2 1 2 '$ 0  Trouble concentrating 1 2 0 0 0  Moving slowly or fidgety/restless 0 2 0 0 0  Suicidal thoughts 0 0 0 0 0  PHQ-9 Score '12 21 9 7 5  '$ Difficult doing work/chores Somewhat difficult Extremely dIfficult Somewhat difficult Somewhat difficult Somewhat difficult      10/20/2022    2:51 PM 09/29/2022    2:07 PM 08/19/2022    2:52 PM 07/22/2022    2:41 PM  GAD 7 : Generalized Anxiety Score  Nervous, Anxious, on Edge '1 3 2 2  '$ Control/stop worrying '1 2 1 1  '$ Worry too much - different things '1 2 1 '$ 0  Trouble relaxing 0 1 0 0  Restless 0 0 0 0  Easily annoyed or irritable 1 1 0 0  Afraid - awful might happen '1 2 1 '$ 0  Total GAD 7 Score '5 11 5 3  '$ Anxiety Difficulty Somewhat difficult Very difficult Somewhat difficult    Anhedonia: no Weight changes: no Insomnia: no   Hypersomnia: yes Fatigue/loss of energy: yes Feelings of worthlessness: no Feelings of guilt:  no Impaired concentration/indecisiveness: no Suicidal ideations: no  Crying spells: no Recent Stressors/Life Changes: no   Relationship problems: no   Family stress: no     Financial stress: no    Job stress: no    Recent death/loss: no  HYPERTENSION / HYPERLIPIDEMIA Satisfied with current treatment? yes Duration of hypertension: chronic BP monitoring frequency: not checking BP medication side effects: no Duration of hyperlipidemia: chronic Cholesterol medication side effects: no Cholesterol supplements: none Past cholesterol medications: atorvastatin Medication compliance: excellent compliance Aspirin: yes Recent stressors: no Recurrent headaches: no Visual changes: no Palpitations: no Dyspnea: no Chest pain: no Lower extremity edema: no Dizzy/lightheaded: no   Relevant past medical, surgical, family and social history reviewed and updated as indicated. Interim medical history since our last visit reviewed. Allergies and medications reviewed and updated.  Review of Systems  Constitutional: Negative.   Respiratory: Negative.    Cardiovascular: Negative.   Gastrointestinal: Negative.   Musculoskeletal: Negative.   Psychiatric/Behavioral: Negative.      Per HPI unless specifically indicated above  Objective:    BP 137/87   Pulse (!) 101   Temp 98.7 F (37.1 C) (Oral)   Ht '5\' 1"'$  (1.549 m)   Wt 156 lb 1.6 oz (70.8 kg)   LMP  (LMP Unknown)   SpO2 94%   BMI 29.49 kg/m   Wt Readings from Last 3 Encounters:  10/20/22 156 lb 1.6 oz (70.8 kg)  09/29/22 160 lb 3.2 oz (72.7 kg)  08/19/22 174 lb 9.6 oz (79.2 kg)    Physical Exam Vitals and nursing note reviewed.  Constitutional:      General: She is not in acute distress.    Appearance: Normal appearance. She is normal weight. She is not ill-appearing, toxic-appearing or diaphoretic.  HENT:     Head: Normocephalic and atraumatic.     Right Ear: External ear normal.     Left Ear: External ear normal.      Nose: Nose normal.     Mouth/Throat:     Mouth: Mucous membranes are moist.     Pharynx: Oropharynx is clear.  Eyes:     General: No scleral icterus.       Right eye: No discharge.        Left eye: No discharge.     Extraocular Movements: Extraocular movements intact.     Conjunctiva/sclera: Conjunctivae normal.     Pupils: Pupils are equal, round, and reactive to light.  Cardiovascular:     Rate and Rhythm: Normal rate and regular rhythm.     Pulses: Normal pulses.     Heart sounds: Normal heart sounds. No murmur heard.    No friction rub. No gallop.  Pulmonary:     Effort: Pulmonary effort is normal. No respiratory distress.     Breath sounds: Normal breath sounds. No stridor. No wheezing, rhonchi or rales.  Chest:     Chest wall: No tenderness.  Musculoskeletal:        General: Normal range of motion.     Cervical back: Normal range of motion and neck supple.  Skin:    General: Skin is warm and dry.     Capillary Refill: Capillary refill takes less than 2 seconds.     Coloration: Skin is not jaundiced or pale.     Findings: No bruising, erythema, lesion or rash.  Neurological:     General: No focal deficit present.     Mental Status: She is alert and oriented to person, place, and time. Mental status is at baseline.  Psychiatric:        Mood and Affect: Mood normal.        Behavior: Behavior normal.        Thought Content: Thought content normal.        Judgment: Judgment normal.     Results for orders placed or performed in visit on 10/20/22  Lipid Panel w/o Chol/HDL Ratio  Result Value Ref Range   Cholesterol, Total 219 (H) 100 - 199 mg/dL   Triglycerides 169 (H) 0 - 149 mg/dL   HDL 71 >39 mg/dL   VLDL Cholesterol Cal 29 5 - 40 mg/dL   LDL Chol Calc (NIH) 119 (H) 0 - 99 mg/dL  Comprehensive metabolic panel  Result Value Ref Range   Glucose 121 (H) 70 - 99 mg/dL   BUN 13 8 - 27 mg/dL   Creatinine, Ser 0.93 0.57 - 1.00 mg/dL   eGFR 62 >59 mL/min/1.73    BUN/Creatinine Ratio 14 12 - 28   Sodium 142 134 -  144 mmol/L   Potassium 4.6 3.5 - 5.2 mmol/L   Chloride 102 96 - 106 mmol/L   CO2 23 20 - 29 mmol/L   Calcium 10.0 8.7 - 10.3 mg/dL   Total Protein 7.1 6.0 - 8.5 g/dL   Albumin 4.5 3.8 - 4.8 g/dL   Globulin, Total 2.6 1.5 - 4.5 g/dL   Albumin/Globulin Ratio 1.7 1.2 - 2.2   Bilirubin Total 0.2 0.0 - 1.2 mg/dL   Alkaline Phosphatase 74 44 - 121 IU/L   AST 18 0 - 40 IU/L   ALT 16 0 - 32 IU/L  VITAMIN D 25 Hydroxy (Vit-D Deficiency, Fractures)  Result Value Ref Range   Vit D, 25-Hydroxy 39.2 30.0 - 100.0 ng/mL      Assessment & Plan:   Problem List Items Addressed This Visit       Genitourinary   Benign hypertensive renal disease - Primary    Under good control on current regimen. Continue current regimen. Continue to monitor. Call with any concerns. Refills given. Labs drawn today.        Relevant Orders   Comprehensive metabolic panel (Completed)     Other   Anxiety    Under good control on current regimen. Continue current regimen. Continue to monitor. Call with any concerns. Refills given. Labs drawn today.       Relevant Medications   mirtazapine (REMERON) 15 MG tablet   Hyperlipidemia    Under good control on current regimen. Continue current regimen. Continue to monitor. Call with any concerns. Refills given. Labs drawn today.       Relevant Medications   amLODipine (NORVASC) 2.5 MG tablet   atorvastatin (LIPITOR) 40 MG tablet   losartan (COZAAR) 50 MG tablet   Other Relevant Orders   Lipid Panel w/o Chol/HDL Ratio (Completed)   Comprehensive metabolic panel (Completed)   Vitamin D deficiency disease   Relevant Orders   VITAMIN D 25 Hydroxy (Vit-D Deficiency, Fractures) (Completed)   MDD (major depressive disorder), recurrent episode, moderate (HCC)    Under good control on current regimen. Continue current regimen. Continue to monitor. Call with any concerns. Refills given. Labs drawn today.       Relevant  Medications   mirtazapine (REMERON) 15 MG tablet     Follow up plan: Return in about 3 months (around 01/18/2023).

## 2022-10-21 LAB — LIPID PANEL W/O CHOL/HDL RATIO
Cholesterol, Total: 219 mg/dL — ABNORMAL HIGH (ref 100–199)
HDL: 71 mg/dL (ref >39)
LDL Chol Calc (NIH): 119 mg/dL — ABNORMAL HIGH (ref 0–99)
Triglycerides: 169 mg/dL — ABNORMAL HIGH (ref 0–149)
VLDL Cholesterol Cal: 29 mg/dL (ref 5–40)

## 2022-10-21 LAB — COMPREHENSIVE METABOLIC PANEL
ALT: 16 IU/L (ref 0–32)
AST: 18 IU/L (ref 0–40)
Albumin/Globulin Ratio: 1.7 (ref 1.2–2.2)
Albumin: 4.5 g/dL (ref 3.8–4.8)
Alkaline Phosphatase: 74 IU/L (ref 44–121)
BUN/Creatinine Ratio: 14 (ref 12–28)
BUN: 13 mg/dL (ref 8–27)
Bilirubin Total: 0.2 mg/dL (ref 0.0–1.2)
CO2: 23 mmol/L (ref 20–29)
Calcium: 10 mg/dL (ref 8.7–10.3)
Chloride: 102 mmol/L (ref 96–106)
Creatinine, Ser: 0.93 mg/dL (ref 0.57–1.00)
Globulin, Total: 2.6 g/dL (ref 1.5–4.5)
Glucose: 121 mg/dL — ABNORMAL HIGH (ref 70–99)
Potassium: 4.6 mmol/L (ref 3.5–5.2)
Sodium: 142 mmol/L (ref 134–144)
Total Protein: 7.1 g/dL (ref 6.0–8.5)
eGFR: 62 mL/min/{1.73_m2} (ref >59)

## 2022-10-21 LAB — VITAMIN D 25 HYDROXY (VIT D DEFICIENCY, FRACTURES): Vit D, 25-Hydroxy: 39.2 ng/mL (ref 30.0–100.0)

## 2022-10-23 NOTE — Assessment & Plan Note (Signed)
Under good control on current regimen. Continue current regimen. Continue to monitor. Call with any concerns. Refills given. Labs drawn today.   

## 2022-11-09 DIAGNOSIS — G8929 Other chronic pain: Secondary | ICD-10-CM | POA: Diagnosis not present

## 2022-11-09 DIAGNOSIS — G479 Sleep disorder, unspecified: Secondary | ICD-10-CM | POA: Diagnosis not present

## 2022-11-09 DIAGNOSIS — R251 Tremor, unspecified: Secondary | ICD-10-CM | POA: Diagnosis not present

## 2022-11-09 DIAGNOSIS — R413 Other amnesia: Secondary | ICD-10-CM | POA: Diagnosis not present

## 2022-11-09 DIAGNOSIS — G20C Parkinsonism, unspecified: Secondary | ICD-10-CM | POA: Diagnosis not present

## 2022-11-09 DIAGNOSIS — M545 Low back pain, unspecified: Secondary | ICD-10-CM | POA: Diagnosis not present

## 2022-11-18 ENCOUNTER — Other Ambulatory Visit: Payer: Self-pay | Admitting: Family Medicine

## 2022-11-18 NOTE — Telephone Encounter (Signed)
Requested Prescriptions  Pending Prescriptions Disp Refills   loratadine (CLARITIN) 10 MG tablet [Pharmacy Med Name: Loratadine 10mg  Tablet] 90 tablet 0    Sig: Take 1 tablet by mouth daily.     Ear, Nose, and Throat:  Antihistamines 2 Passed - 11/18/2022  5:09 AM      Passed - Cr in normal range and within 360 days    Creatinine  Date Value Ref Range Status  06/08/2014 0.73 0.60 - 1.30 mg/dL Final   Creatinine, Ser  Date Value Ref Range Status  10/20/2022 0.93 0.57 - 1.00 mg/dL Final         Passed - Valid encounter within last 12 months    Recent Outpatient Visits           4 weeks ago Benign hypertensive renal disease   [place], Megan P, DO   1 month ago Weakness generalized   [place] [facility] M[place], Erin E, PA-C   3 months ago MDD (major depressive disorder), recurrent episode, moderate ([place])   [place], Megan P, DO   3 months ago MDD (major depressive disorder), recurrent episode, moderate ([place])   [place], Megan P, DO   5 months ago MDD (major depressive disorder), recurrent episode, moderate ([place])   [place], Barb Merino, DO       Future Appointments             In 2 months Wynetta Emery, Barb Merino, DO [place], [place]   In 8 months Brendolyn Patty, MD [place]

## 2022-12-20 ENCOUNTER — Telehealth: Payer: Self-pay | Admitting: Family Medicine

## 2022-12-20 NOTE — Telephone Encounter (Signed)
Pt's spouse brought in parking placard to be filled out by provider.  Also an already wrote check to the dmv and a self addressed envelope for [place] to send when completed to [facility].  When complete please give back to Ms. Iris.  Placed in provider's folder.

## 2022-12-21 DIAGNOSIS — H43813 Vitreous degeneration, bilateral: Secondary | ICD-10-CM | POA: Diagnosis not present

## 2022-12-21 DIAGNOSIS — Z961 Presence of intraocular lens: Secondary | ICD-10-CM | POA: Diagnosis not present

## 2022-12-21 DIAGNOSIS — H26491 Other secondary cataract, right eye: Secondary | ICD-10-CM | POA: Diagnosis not present

## 2023-01-12 ENCOUNTER — Other Ambulatory Visit: Payer: Self-pay | Admitting: Family Medicine

## 2023-01-12 NOTE — Telephone Encounter (Signed)
Requested Prescriptions  Pending Prescriptions Disp Refills   DULoxetine (CYMBALTA) 60 MG capsule [Pharmacy Med Name: Duloxetine 60mg  Delayed-Release Capsule] 90 capsule 0    Sig: Take 1 capsule by mouth daily.     Psychiatry: Antidepressants - SNRI - duloxetine Passed - 01/12/2023  5:12 AM      Passed - Cr in normal range and within 360 days    Creatinine  Date Value Ref Range Status  06/08/2014 0.73 0.60 - 1.30 mg/dL Final   Creatinine, Ser  Date Value Ref Range Status  10/20/2022 0.93 0.57 - 1.00 mg/dL Final         Passed - eGFR is 30 or above and within 360 days    EGFR (African American)  Date Value Ref Range Status  06/08/2014 >60 >49mL/min Final   GFR calc Af Amer  Date Value Ref Range Status  09/15/2020 64 >59 mL/min/1.73 Final    Comment:    **In accordance with recommendations from the NKF-ASN Task force,**   [facility] is in the process of updating its eGFR calculation to the   2021 CKD-EPI creatinine equation that estimates kidney function   without a race variable.    EGFR (Non-African Amer.)  Date Value Ref Range Status  06/08/2014 >60 >38mL/min Final    Comment:    eGFR values <85mL/min/1.73 m2 may be an indication of chronic kidney disease (CKD). Calculated eGFR, using the MRDR Study equation, is useful in  patients with stable renal function. The eGFR calculation will not be reliable in acutely ill patients when serum creatinine is changing rapidly. It is not useful in patients on dialysis. The eGFR calculation may not be applicable to patients at the low and high extremes of body sizes, pregnant women, and vegetarians.    GFR calc non Af Amer  Date Value Ref Range Status  09/15/2020 55 (L) >59 mL/min/1.73 Final   eGFR  Date Value Ref Range Status  10/20/2022 62 >59 mL/min/1.73 Final         Passed - Completed PHQ-2 or PHQ-9 in the last 360 days      Passed - Last BP in normal range    BP Readings from Last 1 Encounters:  10/20/22 137/87          Passed - Valid encounter within last 6 months    Recent Outpatient Visits           2 months ago Benign hypertensive renal disease   [place] [facility] [place], Megan P, DO   3 months ago Weakness generalized   [place] [facility] Mecum, Erin E, PA-C   4 months ago MDD (major depressive disorder), recurrent episode, moderate (HCC)   [place] [facility] [place], Megan P, DO   5 months ago MDD (major depressive disorder), recurrent episode, moderate (HCC)   [place] [facility] [place], Megan P, DO   7 months ago MDD (major depressive disorder), recurrent episode, moderate (HCC)   [place] [facility] [place], Oralia Rud, DO       Future Appointments             In 6 days Dorcas Carrow, DO [place] [facility]   In 6 months Willeen Niece, MD [facility]

## 2023-01-13 ENCOUNTER — Other Ambulatory Visit: Payer: Self-pay

## 2023-01-18 ENCOUNTER — Ambulatory Visit (INDEPENDENT_AMBULATORY_CARE_PROVIDER_SITE_OTHER): Payer: Medicare Other | Admitting: Family Medicine

## 2023-01-18 ENCOUNTER — Encounter: Payer: Self-pay | Admitting: Family Medicine

## 2023-01-18 VITALS — BP 128/82 | HR 91 | Temp 98.4°F | Ht 61.0 in | Wt 161.8 lb

## 2023-01-18 DIAGNOSIS — G8929 Other chronic pain: Secondary | ICD-10-CM

## 2023-01-18 DIAGNOSIS — M4726 Other spondylosis with radiculopathy, lumbar region: Secondary | ICD-10-CM

## 2023-01-18 DIAGNOSIS — M25571 Pain in right ankle and joints of right foot: Secondary | ICD-10-CM | POA: Diagnosis not present

## 2023-01-18 DIAGNOSIS — M25572 Pain in left ankle and joints of left foot: Secondary | ICD-10-CM

## 2023-01-18 MED ORDER — KETOROLAC TROMETHAMINE 30 MG/ML IJ SOLN
30.0000 mg | Freq: Once | INTRAMUSCULAR | Status: AC
Start: 2023-01-18 — End: 2023-01-18
  Administered 2023-01-18: 30 mg via INTRAMUSCULAR

## 2023-01-18 NOTE — Patient Instructions (Addendum)
Tuesday 01/24/23 1:15PM [facility] [address] [place], [place] [address] Phone: [phone]

## 2023-01-18 NOTE — Progress Notes (Signed)
BP 128/82   Pulse 91   Temp 98.4 F (36.9 C) (Oral)   Ht 5\' 1"  (1.549 m)   Wt 161 lb 12.8 oz (73.4 kg)   LMP  (LMP Unknown)   SpO2 97%   BMI 30.57 kg/m    Subjective:    Patient ID: Cynthia Vaughan, female    DOB: 14-May-1941, 82 y.o.   MRN: [id number]  HPI: Cynthia Vaughan is a 82 y.o. female  Chief Complaint  Patient presents with   Pain   Ankle pain- had been seeing Dr. Logan Bores in the past, but has not seen him in about a year. She notes that her ankles have been hurting significantly.  BACK PAIN- gabapentin has been decreased. She has been taking the tramadol 2x a day, she has not been as fatigued, but pain has been worse Duration: chronic, worsening Mechanism of injury: no trauma Location: bilateral low back Onset: gradual Severity: severe Quality: aching and sore Frequency: constant Radiation: none Aggravating factors: movement Alleviating factors: nothing Status: worse Treatments attempted:  gabapentin, tramadol, rest, ice, heat, APAP, ibuprofen, aleve, and physical therapy  Relief with NSAIDs?: No NSAIDs Taken Nighttime pain:  no Paresthesias / decreased sensation:  no Bowel / bladder incontinence:  no Fevers:  no Dysuria / urinary frequency:  no  She is otherwise doing well with no other concerns or complaints at this time.   Relevant past medical, surgical, family and social history reviewed and updated as indicated. Interim medical history since our last visit reviewed. Allergies and medications reviewed and updated.  Review of Systems  Constitutional: Negative.   Respiratory: Negative.    Cardiovascular: Negative.   Gastrointestinal: Negative.   Musculoskeletal:  Positive for arthralgias, back pain and myalgias. Negative for gait problem, joint swelling, neck pain and neck stiffness.  Skin: Negative.   Psychiatric/Behavioral: Negative.      Per HPI unless specifically indicated above     Objective:    BP 128/82   Pulse 91   Temp 98.4 F  (36.9 C) (Oral)   Ht 5\' 1"  (1.549 m)   Wt 161 lb 12.8 oz (73.4 kg)   LMP  (LMP Unknown)   SpO2 97%   BMI 30.57 kg/m   Wt Readings from Last 3 Encounters:  01/18/23 161 lb 12.8 oz (73.4 kg)  10/20/22 156 lb 1.6 oz (70.8 kg)  09/29/22 160 lb 3.2 oz (72.7 kg)    Physical Exam Vitals and nursing note reviewed.  Constitutional:      General: She is not in acute distress.    Appearance: Normal appearance. She is normal weight. She is not ill-appearing, toxic-appearing or diaphoretic.  HENT:     Head: Normocephalic and atraumatic.     Right Ear: External ear normal.     Left Ear: External ear normal.     Nose: Nose normal.     Mouth/Throat:     Mouth: Mucous membranes are moist.     Pharynx: Oropharynx is clear.  Eyes:     General: No scleral icterus.       Right eye: No discharge.        Left eye: No discharge.     Extraocular Movements: Extraocular movements intact.     Conjunctiva/sclera: Conjunctivae normal.     Pupils: Pupils are equal, round, and reactive to light.  Cardiovascular:     Rate and Rhythm: Normal rate and regular rhythm.     Pulses: Normal pulses.     Heart sounds:  Normal heart sounds. No murmur heard.    No friction rub. No gallop.  Pulmonary:     Effort: Pulmonary effort is normal. No respiratory distress.     Breath sounds: Normal breath sounds. No stridor. No wheezing, rhonchi or rales.  Chest:     Chest wall: No tenderness.  Musculoskeletal:        General: Normal range of motion.     Cervical back: Normal range of motion and neck supple.  Skin:    General: Skin is warm and dry.     Capillary Refill: Capillary refill takes less than 2 seconds.     Coloration: Skin is not jaundiced or pale.     Findings: No bruising, erythema, lesion or rash.  Neurological:     General: No focal deficit present.     Mental Status: She is alert and oriented to person, place, and time. Mental status is at baseline.  Psychiatric:        Mood and Affect: Mood normal.         Behavior: Behavior normal.        Thought Content: Thought content normal.        Judgment: Judgment normal.     Results for orders placed or performed in visit on 10/20/22  Lipid Panel w/o Chol/HDL Ratio  Result Value Ref Range   Cholesterol, Total 219 (H) 100 - 199 mg/dL   Triglycerides 161 (H) 0 - 149 mg/dL   HDL 71 >09 mg/dL   VLDL Cholesterol Cal 29 5 - 40 mg/dL   LDL Chol Calc (NIH) 604 (H) 0 - 99 mg/dL  Comprehensive metabolic panel  Result Value Ref Range   Glucose 121 (H) 70 - 99 mg/dL   BUN 13 8 - 27 mg/dL   Creatinine, Ser 5.40 0.57 - 1.00 mg/dL   eGFR 62 >98 JX/BJY/7.82   BUN/Creatinine Ratio 14 12 - 28   Sodium 142 134 - 144 mmol/L   Potassium 4.6 3.5 - 5.2 mmol/L   Chloride 102 96 - 106 mmol/L   CO2 23 20 - 29 mmol/L   Calcium 10.0 8.7 - 10.3 mg/dL   Total Protein 7.1 6.0 - 8.5 g/dL   Albumin 4.5 3.8 - 4.8 g/dL   Globulin, Total 2.6 1.5 - 4.5 g/dL   Albumin/Globulin Ratio 1.7 1.2 - 2.2   Bilirubin Total 0.2 0.0 - 1.2 mg/dL   Alkaline Phosphatase 74 44 - 121 IU/L   AST 18 0 - 40 IU/L   ALT 16 0 - 32 IU/L  VITAMIN D 25 Hydroxy (Vit-D Deficiency, Fractures)  Result Value Ref Range   Vit D, 25-Hydroxy 39.2 30.0 - 100.0 ng/mL      Assessment & Plan:   Problem List Items Addressed This Visit       Musculoskeletal and Integument   Osteoarthritis - Primary    Has been doing worse since gabapentin was decreased due to sedation. Not feeling better with tramadol. Will get her into PM&R to talk about interventional options. Referral generated today.      Relevant Orders   Ambulatory referral to Physical Medicine Rehab   Other Visit Diagnoses     Chronic pain of both ankles       Appointment with podiatry scheduled today. Await their input. Call with any concerns.        Follow up plan: Return in about 3 months (around 04/20/2023).

## 2023-01-18 NOTE — Assessment & Plan Note (Signed)
Has been doing worse since gabapentin was decreased due to sedation. Not feeling better with tramadol. Will get her into PM&R to talk about interventional options. Referral generated today.

## 2023-01-24 ENCOUNTER — Ambulatory Visit (INDEPENDENT_AMBULATORY_CARE_PROVIDER_SITE_OTHER): Payer: Medicare Other

## 2023-01-24 ENCOUNTER — Ambulatory Visit (INDEPENDENT_AMBULATORY_CARE_PROVIDER_SITE_OTHER): Payer: Medicare Other | Admitting: Podiatry

## 2023-01-24 DIAGNOSIS — M19071 Primary osteoarthritis, right ankle and foot: Secondary | ICD-10-CM

## 2023-01-24 DIAGNOSIS — M19072 Primary osteoarthritis, left ankle and foot: Secondary | ICD-10-CM

## 2023-01-24 MED ORDER — BETAMETHASONE SOD PHOS & ACET 6 (3-3) MG/ML IJ SUSP
3.0000 mg | Freq: Once | INTRAMUSCULAR | Status: AC
Start: 2023-01-24 — End: 2023-01-24
  Administered 2023-01-24: 3 mg via INTRA_ARTICULAR

## 2023-01-24 NOTE — Progress Notes (Signed)
Both  No chief complaint on file.  Subjective:  82 y.o. female presenting today for evaluation of bilateral ankle pain.  Progressive onset ongoing for several years.  Patient is concerned for possible arthritis.  She was last seen in the office in March 2023 and at that time ankle injection of the left ankle was provided.  She felt significant relief for several months.  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/2021   Cancer (HCC)    SKIN   CKD (chronic kidney disease) stage 2, GFR 60-89 ml/min    Depression    Edema    LEGS/FEET   GERD (gastroesophageal reflux disease)    Heart murmur    Hx of basal cell carcinoma 10/20/2009   R lateral infraorbital   Hx of basal cell carcinoma 06/30/2015   R infra orbital scar   Hyperlipidemia    Hypertension    Hypopotassemia    Microalbuminuria    Osteoarthritis    feet, legs   Osteopenia    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[facility] spotted fever) 05/10/2016   Urinary incontinence    Vitamin D deficiency disease    Wears dentures    partial lower   Wheezing     Past Surgical History:  Procedure Laterality Date   BLADDER SURGERY     x 2   BUNIONECTOMY     CATARACT EXTRACTION W/PHACO Right 11/23/2015   Procedure: CATARACT EXTRACTION PHACO AND INTRAOCULAR LENS PLACEMENT (IOC);  Surgeon: Sallee Lange, MD;  Location: [facility] ORS;  Service: Ophthalmology;  Laterality: Right;  [place]    1:30.9 AP%  26.9 CDE   42.29 flud casette lot # [id number]  exp05/31/2018   CATARACT EXTRACTION W/PHACO Left 05/12/2020   Procedure: CATARACT EXTRACTION PHACO AND INTRAOCULAR LENS PLACEMENT (IOC) LEFT 15.24 01:28.8;  Surgeon: Galen Manila, MD;  [facility];  Service: Ophthalmology;  Laterality: Left;   COLONOSCOPY     TOTAL ABDOMINAL HYSTERECTOMY  08/22/2002    Total (due to bladder surgery)    Allergies  Allergen Reactions   Codeine Itching   Lisinopril Cough    Objective / Physical Exam:  General:  The patient is alert and oriented x3 in no acute distress. Dermatology: Skin is warm, dry and supple bilateral lower extremities. Negative for open lesions or macerations. Vascular: Palpable pedal pulses bilaterally.  Chronic mild edema noted bilateral.  No erythema Neurological: Grossly intact via light touch Musculoskeletal Exam: Pain on palpation to the anterior lateral medial aspects of the patient's bilateral ankle. Mild edema noted.  Pes planus deformity also noted bilateral  Radiographic ExamB/L ankle 01/24/2023:  Diffuse osseous demineralization consistent with osteopenia normal given the patient's age.  Moderate degenerative changes noted throughout the ankle joint bilateral.  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  Arthritis/capsulitis bilateral ankles  Plan of Care:  -Patient was evaluated. X-Rays reviewed.  -Injection of 0.5 mL Celestone Soluspan injected in the patient's bilateral ankles lateral aspect -Patient currently takes OTC Exer strength Tylenol as well as tramadol prescribed.  Continue -Recommend good supportive tennis shoes and sneakers -Return to clinic as needed   Cynthia Vaughan, DPM [facility]  Dr. Felecia Vaughan, DPM  [address] [place], [place] [address]                Office [phone]  Fax [phone]

## 2023-02-01 DIAGNOSIS — M5136 Other intervertebral disc degeneration, lumbar region: Secondary | ICD-10-CM | POA: Diagnosis not present

## 2023-02-01 DIAGNOSIS — M47816 Spondylosis without myelopathy or radiculopathy, lumbar region: Secondary | ICD-10-CM | POA: Diagnosis not present

## 2023-02-01 DIAGNOSIS — M545 Low back pain, unspecified: Secondary | ICD-10-CM | POA: Diagnosis not present

## 2023-02-14 DIAGNOSIS — M47816 Spondylosis without myelopathy or radiculopathy, lumbar region: Secondary | ICD-10-CM | POA: Diagnosis not present

## 2023-02-16 ENCOUNTER — Other Ambulatory Visit: Payer: Self-pay | Admitting: Family Medicine

## 2023-02-17 NOTE — Telephone Encounter (Signed)
Requested medication (s) are due for refill today: yes  Requested medication (s) are on the active medication list: yes  Last refill:  08/20/23  Future visit scheduled: yes  Notes to clinic:  Unable to refill per protocol, cannot delegate.      Requested Prescriptions  Pending Prescriptions Disp Refills   QUEtiapine (SEROQUEL) 25 MG tablet [Pharmacy Med Name: Quetiapine Fumarate 25mg  Tablet] 90 tablet 0    Sig: Take 1 tablet by mouth at bedtime.     Not Delegated - Psychiatry:  Antipsychotics - Second Generation (Atypical) - quetiapine Failed - 02/16/2023  5:18 AM      Failed - This refill cannot be delegated      Failed - Lipid Panel in normal range within the last 12 months    Cholesterol, Total  Date Value Ref Range Status  10/20/2022 219 (H) 100 - 199 mg/dL Final   LDL Chol Calc (NIH)  Date Value Ref Range Status  10/20/2022 119 (H) 0 - 99 mg/dL Final   HDL  Date Value Ref Range Status  10/20/2022 71 >39 mg/dL Final   Triglycerides  Date Value Ref Range Status  10/20/2022 169 (H) 0 - 149 mg/dL Final         Passed - TSH in normal range and within 360 days    TSH  Date Value Ref Range Status  09/29/2022 2.160 0.450 - 4.500 uIU/mL Final         Passed - Completed PHQ-2 or PHQ-9 in the last 360 days      Passed - Last BP in normal range    BP Readings from Last 1 Encounters:  01/18/23 128/82         Passed - Last Heart Rate in normal range    Pulse Readings from Last 1 Encounters:  01/18/23 91         Passed - Valid encounter within last 6 months    Recent Outpatient Visits           1 month ago Osteoarthritis of spine with radiculopathy, lumbar region   [facility] [place], Megan P, DO   4 months ago Benign hypertensive renal disease   [facility] [place], Megan P, DO   4 months ago Weakness generalized   [facility] Mecum, Erin E, PA-C   6 months ago MDD (major depressive  disorder), recurrent episode, moderate (HCC)   [facility] [place], Megan P, DO   7 months ago MDD (major depressive disorder), recurrent episode, moderate (HCC)   [facility] [place], Oralia Rud, DO       Future Appointments             In 2 months Dorcas Carrow, DO [facility]   In 5 months Willeen Niece, MD [place] [facility]            Passed - CBC within normal limits and completed in the last 12 months    WBC  Date Value Ref Range Status  09/29/2022 7.2 3.4 - 10.8 x10E3/uL Final  12/18/2017 11.7 (H) 3.6 - 11.0 K/uL Final   RBC  Date Value Ref Range Status  09/29/2022 4.59 3.77 - 5.28 x10E6/uL Final  12/18/2017 4.51 3.80 - 5.20 MIL/uL Final   Hemoglobin  Date Value Ref Range Status  09/29/2022 14.3 11.1 - 15.9 g/dL Final   Hematocrit  Date Value Ref  Range Status  09/29/2022 42.2 34.0 - 46.6 % Final   MCHC  Date Value Ref Range Status  09/29/2022 33.9 31.5 - 35.7 g/dL Final  16/05/9603 54.0 32.0 - 36.0 g/dL Final   [facility]  Date Value Ref Range Status  09/29/2022 31.2 26.6 - 33.0 pg Final  12/18/2017 31.4 26.0 - 34.0 pg Final   MCV  Date Value Ref Range Status  09/29/2022 92 79 - 97 fL Final  06/08/2014 95 80 - 100 fL Final   No results found for: "PLTCOUNTKUC", "LABPLAT", "POCPLA" RDW  Date Value Ref Range Status  09/29/2022 12.2 11.7 - 15.4 % Final  06/08/2014 12.9 11.5 - 14.5 % Final         Passed - CMP within normal limits and completed in the last 12 months    Albumin  Date Value Ref Range Status  10/20/2022 4.5 3.8 - 4.8 g/dL Final  98/06/9146 3.2 (L) 3.4 - 5.0 g/dL Final   Alkaline Phosphatase  Date Value Ref Range Status  10/20/2022 74 44 - 121 IU/L Final  06/08/2014 75 Unit/L Final    Comment:    46-116 NOTE: New Reference Range 03/11/14    ALT  Date Value Ref Range Status  10/20/2022 16 0 - 32 IU/L Final   SGPT (ALT)  Date Value Ref  Range Status  06/08/2014 38 U/L Final    Comment:    14-63 NOTE: New Reference Range 03/11/14    AST  Date Value Ref Range Status  10/20/2022 18 0 - 40 IU/L Final   SGOT(AST)  Date Value Ref Range Status  06/08/2014 31 15 - 37 Unit/L Final   BUN  Date Value Ref Range Status  10/20/2022 13 8 - 27 mg/dL Final  82/95/6213 10 7 - 18 mg/dL Final   Calcium  Date Value Ref Range Status  10/20/2022 10.0 8.7 - 10.3 mg/dL Final   Calcium, Total  Date Value Ref Range Status  06/08/2014 8.2 (L) 8.5 - 10.1 mg/dL Final   CO2  Date Value Ref Range Status  10/20/2022 23 20 - 29 mmol/L Final   Co2  Date Value Ref Range Status  06/08/2014 26 21 - 32 mmol/L Final   Creatinine  Date Value Ref Range Status  06/08/2014 0.73 0.60 - 1.30 mg/dL Final   Creatinine, Ser  Date Value Ref Range Status  10/20/2022 0.93 0.57 - 1.00 mg/dL Final   Glucose  Date Value Ref Range Status  10/20/2022 121 (H) 70 - 99 mg/dL Final  08/65/7846 84 65 - 99 mg/dL Final   Glucose, Bld  Date Value Ref Range Status  12/18/2017 141 (H) 65 - 99 mg/dL Final   Glucose-Capillary  Date Value Ref Range Status  12/18/2017 124 (H) 65 - 99 mg/dL Final   Potassium  Date Value Ref Range Status  10/20/2022 4.6 3.5 - 5.2 mmol/L Final  06/08/2014 3.6 3.5 - 5.1 mmol/L Final   Sodium  Date Value Ref Range Status  10/20/2022 142 134 - 144 mmol/L Final  06/08/2014 142 136 - 145 mmol/L Final   Bilirubin,Total  Date Value Ref Range Status  06/08/2014 0.3 0.2 - 1.0 mg/dL Final   Bilirubin Total  Date Value Ref Range Status  10/20/2022 0.2 0.0 - 1.2 mg/dL Final   Protein  Date Value Ref Range Status  06/08/2014 Negative NEGATIVE Final   Protein,UA  Date Value Ref Range Status  10/01/2021 Negative Negative/Trace Final   Total Protein  Date Value Ref Range Status  10/20/2022 7.1 6.0 - 8.5 g/dL Final  91/47/8295 6.8 6.4 - 8.2 g/dL Final   EGFR (African American)  Date Value Ref Range Status   06/08/2014 >60 >108mL/min Final   GFR calc Af Amer  Date Value Ref Range Status  09/15/2020 64 >59 mL/min/1.73 Final    Comment:    **In accordance with recommendations from the NKF-ASN Task force,**   [facility] is in the process of updating its eGFR calculation to the   2021 CKD-EPI creatinine equation that estimates kidney function   without a race variable.    eGFR  Date Value Ref Range Status  10/20/2022 62 >59 mL/min/1.73 Final   EGFR (Non-African Amer.)  Date Value Ref Range Status  06/08/2014 >60 >55mL/min Final    Comment:    eGFR values <67mL/min/1.73 m2 may be an indication of chronic kidney disease (CKD). Calculated eGFR, using the MRDR Study equation, is useful in  patients with stable renal function. The eGFR calculation will not be reliable in acutely ill patients when serum creatinine is changing rapidly. It is not useful in patients on dialysis. The eGFR calculation may not be applicable to patients at the low and high extremes of body sizes, pregnant women, and vegetarians.    GFR calc non Af Amer  Date Value Ref Range Status  09/15/2020 55 (L) >59 mL/min/1.73 Final         Signed Prescriptions Disp Refills   fluticasone (FLONASE) 50 MCG/ACT nasal spray 48 g 0    Sig: Instill 2 sprays into each nostril daily.     Ear, Nose, and Throat: Nasal Preparations - Corticosteroids Passed - 02/16/2023  5:18 AM      Passed - Valid encounter within last 12 months    Recent Outpatient Visits           1 month ago Osteoarthritis of spine with radiculopathy, lumbar region   [facility] Megan P, DO   4 months ago Benign hypertensive renal disease   [facility] [place], Megan P, DO   4 months ago Weakness generalized   [facility] Mecum, Erin E, PA-C   6 months ago MDD (major depressive disorder), recurrent episode, moderate (HCC)   [facility] [place],  Megan P, DO   7 months ago MDD (major depressive disorder), recurrent episode, moderate (HCC)   [facility] Dorcas Carrow, DO       Future Appointments             In 2 months Laural Benes, Oralia Rud, DO [place]   In 5 months Willeen Niece, MD [facility]

## 2023-02-17 NOTE — Telephone Encounter (Signed)
Requested Prescriptions  Pending Prescriptions Disp Refills   QUEtiapine (SEROQUEL) 25 MG tablet [Pharmacy Med Name: Quetiapine Fumarate 25mg  Tablet] 90 tablet 0    Sig: Take 1 tablet by mouth at bedtime.     Not Delegated - Psychiatry:  Antipsychotics - Second Generation (Atypical) - quetiapine Failed - 02/16/2023  5:18 AM      Failed - This refill cannot be delegated      Failed - Lipid Panel in normal range within the last 12 months    Cholesterol, Total  Date Value Ref Range Status  10/20/2022 219 (H) 100 - 199 mg/dL Final   LDL Chol Calc (NIH)  Date Value Ref Range Status  10/20/2022 119 (H) 0 - 99 mg/dL Final   HDL  Date Value Ref Range Status  10/20/2022 71 >39 mg/dL Final   Triglycerides  Date Value Ref Range Status  10/20/2022 169 (H) 0 - 149 mg/dL Final         Passed - TSH in normal range and within 360 days    TSH  Date Value Ref Range Status  09/29/2022 2.160 0.450 - 4.500 uIU/mL Final         Passed - Completed PHQ-2 or PHQ-9 in the last 360 days      Passed - Last BP in normal range    BP Readings from Last 1 Encounters:  01/18/23 128/82         Passed - Last Heart Rate in normal range    Pulse Readings from Last 1 Encounters:  01/18/23 91         Passed - Valid encounter within last 6 months    Recent Outpatient Visits           1 month ago Osteoarthritis of spine with radiculopathy, lumbar region   [facility] [place], Megan P, DO   4 months ago Benign hypertensive renal disease   [place] [facility] [place], Megan P, DO   4 months ago Weakness generalized   [place] [facility] Mecum, Erin E, PA-C   6 months ago MDD (major depressive disorder), recurrent episode, moderate (HCC)   [place] [facility] [place], Megan P, DO   7 months ago MDD (major depressive disorder), recurrent episode, moderate (HCC)   [place] [facility] [place], Oralia Rud, DO        Future Appointments             In 2 months Dorcas Carrow, DO [place] [facility]   In 5 months Willeen Niece, MD [place] [facility]            Passed - CBC within normal limits and completed in the last 12 months    WBC  Date Value Ref Range Status  09/29/2022 7.2 3.4 - 10.8 x10E3/uL Final  12/18/2017 11.7 (H) 3.6 - 11.0 K/uL Final   RBC  Date Value Ref Range Status  09/29/2022 4.59 3.77 - 5.28 x10E6/uL Final  12/18/2017 4.51 3.80 - 5.20 MIL/uL Final   Hemoglobin  Date Value Ref Range Status  09/29/2022 14.3 11.1 - 15.9 g/dL Final   Hematocrit  Date Value Ref Range Status  09/29/2022 42.2 34.0 - 46.6 % Final   MCHC  Date Value Ref Range Status  09/29/2022 33.9 31.5 - 35.7 g/dL Final  16/05/9603 54.0 32.0 - 36.0 g/dL Final   [facility]  Date Value Ref Range Status  09/29/2022 31.2  26.6 - 33.0 pg Final  12/18/2017 31.4 26.0 - 34.0 pg Final   MCV  Date Value Ref Range Status  09/29/2022 92 79 - 97 fL Final  06/08/2014 95 80 - 100 fL Final   No results found for: "PLTCOUNTKUC", "LABPLAT", "POCPLA" RDW  Date Value Ref Range Status  09/29/2022 12.2 11.7 - 15.4 % Final  06/08/2014 12.9 11.5 - 14.5 % Final         Passed - CMP within normal limits and completed in the last 12 months    Albumin  Date Value Ref Range Status  10/20/2022 4.5 3.8 - 4.8 g/dL Final  40/98/1191 3.2 (L) 3.4 - 5.0 g/dL Final   Alkaline Phosphatase  Date Value Ref Range Status  10/20/2022 74 44 - 121 IU/L Final  06/08/2014 75 Unit/L Final    Comment:    46-116 NOTE: New Reference Range 03/11/14    ALT  Date Value Ref Range Status  10/20/2022 16 0 - 32 IU/L Final   SGPT (ALT)  Date Value Ref Range Status  06/08/2014 38 U/L Final    Comment:    14-63 NOTE: New Reference Range 03/11/14    AST  Date Value Ref Range Status  10/20/2022 18 0 - 40 IU/L Final   SGOT(AST)  Date Value Ref Range Status  06/08/2014 31 15 - 37 Unit/L Final    BUN  Date Value Ref Range Status  10/20/2022 13 8 - 27 mg/dL Final  47/82/9562 10 7 - 18 mg/dL Final   Calcium  Date Value Ref Range Status  10/20/2022 10.0 8.7 - 10.3 mg/dL Final   Calcium, Total  Date Value Ref Range Status  06/08/2014 8.2 (L) 8.5 - 10.1 mg/dL Final   CO2  Date Value Ref Range Status  10/20/2022 23 20 - 29 mmol/L Final   Co2  Date Value Ref Range Status  06/08/2014 26 21 - 32 mmol/L Final   Creatinine  Date Value Ref Range Status  06/08/2014 0.73 0.60 - 1.30 mg/dL Final   Creatinine, Ser  Date Value Ref Range Status  10/20/2022 0.93 0.57 - 1.00 mg/dL Final   Glucose  Date Value Ref Range Status  10/20/2022 121 (H) 70 - 99 mg/dL Final  13/03/6577 84 65 - 99 mg/dL Final   Glucose, Bld  Date Value Ref Range Status  12/18/2017 141 (H) 65 - 99 mg/dL Final   Glucose-Capillary  Date Value Ref Range Status  12/18/2017 124 (H) 65 - 99 mg/dL Final   Potassium  Date Value Ref Range Status  10/20/2022 4.6 3.5 - 5.2 mmol/L Final  06/08/2014 3.6 3.5 - 5.1 mmol/L Final   Sodium  Date Value Ref Range Status  10/20/2022 142 134 - 144 mmol/L Final  06/08/2014 142 136 - 145 mmol/L Final   Bilirubin,Total  Date Value Ref Range Status  06/08/2014 0.3 0.2 - 1.0 mg/dL Final   Bilirubin Total  Date Value Ref Range Status  10/20/2022 0.2 0.0 - 1.2 mg/dL Final   Protein  Date Value Ref Range Status  06/08/2014 Negative NEGATIVE Final   Protein,UA  Date Value Ref Range Status  10/01/2021 Negative Negative/Trace Final   Total Protein  Date Value Ref Range Status  10/20/2022 7.1 6.0 - 8.5 g/dL Final  46/96/2952 6.8 6.4 - 8.2 g/dL Final   EGFR (African American)  Date Value Ref Range Status  06/08/2014 >60 >32mL/min Final   GFR calc Af Amer  Date Value Ref Range Status  09/15/2020 64 >59  mL/min/1.73 Final    Comment:    **In accordance with recommendations from the NKF-ASN Task force,**   [facility] is in the process of updating its eGFR  calculation to the   2021 CKD-EPI creatinine equation that estimates kidney function   without a race variable.    eGFR  Date Value Ref Range Status  10/20/2022 62 >59 mL/min/1.73 Final   EGFR (Non-African Amer.)  Date Value Ref Range Status  06/08/2014 >60 >6mL/min Final    Comment:    eGFR values <87mL/min/1.73 m2 may be an indication of chronic kidney disease (CKD). Calculated eGFR, using the MRDR Study equation, is useful in  patients with stable renal function. The eGFR calculation will not be reliable in acutely ill patients when serum creatinine is changing rapidly. It is not useful in patients on dialysis. The eGFR calculation may not be applicable to patients at the low and high extremes of body sizes, pregnant women, and vegetarians.    GFR calc non Af Amer  Date Value Ref Range Status  09/15/2020 55 (L) >59 mL/min/1.73 Final          fluticasone (FLONASE) 50 MCG/ACT nasal spray [Pharmacy Med Name: Fluticasone Propionate 61mcg/actuation Nasal Spray] 48 g 0    Sig: Instill 2 sprays into each nostril daily.     Ear, Nose, and Throat: Nasal Preparations - Corticosteroids Passed - 02/16/2023  5:18 AM      Passed - Valid encounter within last 12 months    Recent Outpatient Visits           1 month ago Osteoarthritis of spine with radiculopathy, lumbar region   [facility], Megan P, DO   4 months ago Benign hypertensive renal disease   [place] [facility] [place], Megan P, DO   4 months ago Weakness generalized   [place] [facility] Mecum, Erin E, PA-C   6 months ago MDD (major depressive disorder), recurrent episode, moderate (HCC)   [place] [facility] [place], Megan P, DO   7 months ago MDD (major depressive disorder), recurrent episode, moderate (HCC)   [place] [facility] Dorcas Carrow, DO       Future Appointments             In 2 months Laural Benes,  Oralia Rud, DO [place] [facility]   In 5 months Willeen Niece, MD [facility]

## 2023-02-28 DIAGNOSIS — M47816 Spondylosis without myelopathy or radiculopathy, lumbar region: Secondary | ICD-10-CM | POA: Diagnosis not present

## 2023-03-21 DIAGNOSIS — M47816 Spondylosis without myelopathy or radiculopathy, lumbar region: Secondary | ICD-10-CM | POA: Diagnosis not present

## 2023-03-22 ENCOUNTER — Encounter: Payer: Self-pay | Admitting: Family Medicine

## 2023-04-14 ENCOUNTER — Other Ambulatory Visit: Payer: Self-pay | Admitting: Family Medicine

## 2023-04-14 NOTE — Telephone Encounter (Signed)
Requested Prescriptions  Pending Prescriptions Disp Refills   DULoxetine (CYMBALTA) 60 MG capsule [Pharmacy Med Name: Duloxetine 60mg  Delayed-Release Capsule] 90 capsule 1    Sig: Take 1 capsule by mouth daily.     Psychiatry: Antidepressants - SNRI - duloxetine Passed - 04/14/2023 10:19 AM      Passed - Cr in normal range and within 360 days    Creatinine  Date Value Ref Range Status  06/08/2014 0.73 0.60 - 1.30 mg/dL Final   Creatinine, Ser  Date Value Ref Range Status  10/20/2022 0.93 0.57 - 1.00 mg/dL Final         Passed - eGFR is 30 or above and within 360 days    EGFR (African American)  Date Value Ref Range Status  06/08/2014 >60 >60mL/min Final   GFR calc Af Amer  Date Value Ref Range Status  09/15/2020 64 >59 mL/min/1.73 Final    Comment:    **In accordance with recommendations from the NKF-ASN Task force,**   [facility] is in the process of updating its eGFR calculation to the   2021 CKD-EPI creatinine equation that estimates kidney function   without a race variable.    EGFR (Non-African Amer.)  Date Value Ref Range Status  06/08/2014 >60 >59mL/min Final    Comment:    eGFR values <58mL/min/1.73 m2 may be an indication of chronic kidney disease (CKD). Calculated eGFR, using the MRDR Study equation, is useful in  patients with stable renal function. The eGFR calculation will not be reliable in acutely ill patients when serum creatinine is changing rapidly. It is not useful in patients on dialysis. The eGFR calculation may not be applicable to patients at the low and high extremes of body sizes, pregnant women, and vegetarians.    GFR calc non Af Amer  Date Value Ref Range Status  09/15/2020 55 (L) >59 mL/min/1.73 Final   eGFR  Date Value Ref Range Status  10/20/2022 62 >59 mL/min/1.73 Final         Passed - Completed PHQ-2 or PHQ-9 in the last 360 days      Passed - Last BP in normal range    BP Readings from Last 1 Encounters:  01/18/23 128/82          Passed - Valid encounter within last 6 months    Recent Outpatient Visits           2 months ago Osteoarthritis of spine with radiculopathy, lumbar region   [facility] [place], Megan P, DO   5 months ago Benign hypertensive renal disease   [place] [facility] [place], Megan P, DO   6 months ago Weakness generalized   [place] [facility] Mecum, Erin E, PA-C   7 months ago MDD (major depressive disorder), recurrent episode, moderate (HCC)   [facility] [place], Megan P, DO   8 months ago MDD (major depressive disorder), recurrent episode, moderate (HCC)   [place] [facility] [place], Oralia Rud, DO       Future Appointments             In 2 weeks Laural Benes, Oralia Rud, DO [place] [facility]   In 3 months Willeen Niece, MD [facility]

## 2023-04-20 ENCOUNTER — Ambulatory Visit: Payer: Medicare Other | Admitting: Family Medicine

## 2023-05-02 ENCOUNTER — Encounter: Payer: Self-pay | Admitting: Family Medicine

## 2023-05-02 ENCOUNTER — Ambulatory Visit (INDEPENDENT_AMBULATORY_CARE_PROVIDER_SITE_OTHER): Payer: Medicare Other | Admitting: Family Medicine

## 2023-05-02 VITALS — BP 123/83 | HR 99 | Temp 98.3°F | Wt 181.0 lb

## 2023-05-02 DIAGNOSIS — E559 Vitamin D deficiency, unspecified: Secondary | ICD-10-CM

## 2023-05-02 DIAGNOSIS — F419 Anxiety disorder, unspecified: Secondary | ICD-10-CM

## 2023-05-02 DIAGNOSIS — E782 Mixed hyperlipidemia: Secondary | ICD-10-CM | POA: Diagnosis not present

## 2023-05-02 DIAGNOSIS — I129 Hypertensive chronic kidney disease with stage 1 through stage 4 chronic kidney disease, or unspecified chronic kidney disease: Secondary | ICD-10-CM

## 2023-05-02 DIAGNOSIS — M4726 Other spondylosis with radiculopathy, lumbar region: Secondary | ICD-10-CM

## 2023-05-02 DIAGNOSIS — Z23 Encounter for immunization: Secondary | ICD-10-CM

## 2023-05-02 DIAGNOSIS — F331 Major depressive disorder, recurrent, moderate: Secondary | ICD-10-CM

## 2023-05-02 LAB — MICROALBUMIN, URINE WAIVED
Creatinine, Urine Waived: 300 mg/dL (ref 10–300)
Microalb, Ur Waived: 150 mg/L — ABNORMAL HIGH (ref 0–19)

## 2023-05-02 MED ORDER — OMEPRAZOLE 20 MG PO CPDR: 20.0000 mg | DELAYED_RELEASE_CAPSULE | Freq: Every day | ORAL | 1 refills | Status: DC

## 2023-05-02 MED ORDER — LORATADINE 10 MG PO TABS: 10.0000 mg | ORAL_TABLET | Freq: Every day | ORAL | 0 refills | Status: DC

## 2023-05-02 MED ORDER — AMLODIPINE BESYLATE 2.5 MG PO TABS: 2.5000 mg | ORAL_TABLET | Freq: Every day | ORAL | 1 refills | Status: DC

## 2023-05-02 MED ORDER — TRAMADOL HCL 50 MG PO TABS: 50.0000 mg | ORAL_TABLET | Freq: Two times a day (BID) | ORAL | 1 refills | Status: DC | PRN

## 2023-05-02 MED ORDER — DULOXETINE HCL 60 MG PO CPEP: 60.0000 mg | ORAL_CAPSULE | Freq: Every day | ORAL | 1 refills | Status: DC

## 2023-05-02 MED ORDER — ATORVASTATIN CALCIUM 40 MG PO TABS: 40.0000 mg | ORAL_TABLET | Freq: Every day | ORAL | 1 refills | Status: DC

## 2023-05-02 MED ORDER — LOSARTAN POTASSIUM 50 MG PO TABS: 50.0000 mg | ORAL_TABLET | Freq: Every day | ORAL | 1 refills | Status: DC

## 2023-05-02 MED ORDER — BUPROPION HCL ER (XL) 150 MG PO TB24: 150.0000 mg | ORAL_TABLET | Freq: Every day | ORAL | 3 refills | Status: DC

## 2023-05-02 MED ORDER — QUETIAPINE FUMARATE 25 MG PO TABS: 25.0000 mg | ORAL_TABLET | Freq: Every day | ORAL | 1 refills | Status: DC

## 2023-05-02 MED ORDER — FLUTICASONE PROPIONATE 50 MCG/ACT NA SUSP: 2.0000 | Freq: Every day | NASAL | 1 refills | Status: DC

## 2023-05-02 NOTE — Assessment & Plan Note (Signed)
Under good control on current regimen. Continue current regimen. Continue to monitor. Call with any concerns. Refills given. Labs drawn today.   

## 2023-05-02 NOTE — Assessment & Plan Note (Signed)
Rechecking labs today. Await results. Treat as needed.  °

## 2023-05-02 NOTE — Progress Notes (Signed)
BP 123/83   Pulse 99   Temp 98.3 F (36.8 C) (Oral)   Wt 181 lb (82.1 kg)   LMP  (LMP Unknown)   SpO2 96%   BMI 34.20 kg/m    Subjective:    Patient ID: Cynthia Vaughan, female    DOB: 26-Sep-1940, 82 y.o.   MRN: [id number]  HPI: Cynthia Vaughan is a 82 y.o. female  Chief Complaint  Patient presents with   Hypertension   Hyperlipidemia   Depression   HYPERTENSION / HYPERLIPIDEMIA Satisfied with current treatment? yes Duration of hypertension: chronic BP monitoring frequency: not checking BP medication side effects: no Past BP meds: amlodipine, losartan,  Duration of hyperlipidemia: chronic Cholesterol medication side effects: no Cholesterol supplements: none Past cholesterol medications: atorvastatin Medication compliance: excellent compliance Aspirin: yes Recent stressors: no Recurrent headaches: no Visual changes: no Palpitations: no Dyspnea: no Chest pain: no Lower extremity edema: no Dizzy/lightheaded: no  DEPRESSION- lost her son at the beginning of August and has been down and anxious since then Mood status: exacerbated Satisfied with current treatment?: no Symptom severity: moderate  Duration of current treatment : chronic Side effects: no Medication compliance: excellent compliance Psychotherapy/counseling: no  Previous psychiatric medications: duloxetine, seroquel Depressed mood: yes Anxious mood: yes Anhedonia: no Significant weight loss or gain: no Insomnia: no  Fatigue: yes Feelings of worthlessness or guilt: yes Impaired concentration/indecisiveness: yes Suicidal ideations: no Hopelessness: yes Crying spells: yes    05/02/2023    3:33 PM 01/18/2023   11:03 AM 10/20/2022    2:51 PM 09/29/2022    2:06 PM 08/19/2022    2:51 PM  Depression screen PHQ 2/9  Decreased Interest 3 2 2 3 2   Down, Depressed, Hopeless 2 1 1 3 1   PHQ - 2 Score 5 3 3 6 3   Altered sleeping 3 1 2 3 1   Tired, decreased energy 3 2 2 3 2   Change in appetite 3 2 3 3  2   Feeling bad or failure about yourself  1 1 1 2 1   Trouble concentrating 1 1 1 2  0  Moving slowly or fidgety/restless 1 0 0 2 0  Suicidal thoughts 0 0 0 0 0  PHQ-9 Score 17 10 12 21 9   Difficult doing work/chores Very difficult Very difficult Somewhat difficult Extremely dIfficult Somewhat difficult      05/02/2023    3:34 PM 01/18/2023   11:04 AM 10/20/2022    2:51 PM 09/29/2022    2:07 PM  GAD 7 : Generalized Anxiety Score  Nervous, Anxious, on Edge 1 1 1 3   Control/stop worrying 1 1 1 2   Worry too much - different things 1 2 1 2   Trouble relaxing 0 1 0 1  Restless 0 0 0 0  Easily annoyed or irritable 1 0 1 1  Afraid - awful might happen 1 2 1 2   Total GAD 7 Score 5 7 5 11   Anxiety Difficulty Somewhat difficult Somewhat difficult Somewhat difficult Very difficult      Relevant past medical, surgical, family and social history reviewed and updated as indicated. Interim medical history since our last visit reviewed. Allergies and medications reviewed and updated.  Review of Systems  Constitutional: Negative.   Respiratory: Negative.    Cardiovascular: Negative.   Gastrointestinal: Negative.   Musculoskeletal: Negative.   Skin: Negative.   Neurological: Negative.   Psychiatric/Behavioral:  Positive for dysphoric mood. Negative for agitation, behavioral problems, confusion, decreased concentration, hallucinations, self-injury, sleep disturbance and suicidal  ideas. The patient is nervous/anxious. The patient is not hyperactive.     Per HPI unless specifically indicated above     Objective:    BP 123/83   Pulse 99   Temp 98.3 F (36.8 C) (Oral)   Wt 181 lb (82.1 kg)   LMP  (LMP Unknown)   SpO2 96%   BMI 34.20 kg/m   Wt Readings from Last 3 Encounters:  05/02/23 181 lb (82.1 kg)  01/18/23 161 lb 12.8 oz (73.4 kg)  10/20/22 156 lb 1.6 oz (70.8 kg)    Physical Exam Vitals and nursing note reviewed.  Constitutional:      General: She is not in acute distress.     Appearance: Normal appearance. She is obese. She is not ill-appearing, toxic-appearing or diaphoretic.  HENT:     Head: Normocephalic and atraumatic.     Right Ear: External ear normal.     Left Ear: External ear normal.     Nose: Nose normal.     Mouth/Throat:     Mouth: Mucous membranes are moist.     Pharynx: Oropharynx is clear.  Eyes:     General: No scleral icterus.       Right eye: No discharge.        Left eye: No discharge.     Extraocular Movements: Extraocular movements intact.     Conjunctiva/sclera: Conjunctivae normal.     Pupils: Pupils are equal, round, and reactive to light.  Cardiovascular:     Rate and Rhythm: Normal rate and regular rhythm.     Pulses: Normal pulses.     Heart sounds: Normal heart sounds. No murmur heard.    No friction rub. No gallop.  Pulmonary:     Effort: Pulmonary effort is normal. No respiratory distress.     Breath sounds: Normal breath sounds. No stridor. No wheezing, rhonchi or rales.  Chest:     Chest wall: No tenderness.  Musculoskeletal:        General: Normal range of motion.     Cervical back: Normal range of motion and neck supple.  Skin:    General: Skin is warm and dry.     Capillary Refill: Capillary refill takes less than 2 seconds.     Coloration: Skin is not jaundiced or pale.     Findings: No bruising, erythema, lesion or rash.  Neurological:     General: No focal deficit present.     Mental Status: She is alert and oriented to person, place, and time. Mental status is at baseline.  Psychiatric:        Mood and Affect: Mood normal. Affect is flat and tearful.        Behavior: Behavior normal.        Thought Content: Thought content normal.        Judgment: Judgment normal.     Results for orders placed or performed in visit on 10/20/22  Lipid Panel w/o Chol/HDL Ratio  Result Value Ref Range   Cholesterol, Total 219 (H) 100 - 199 mg/dL   Triglycerides 433 (H) 0 - 149 mg/dL   HDL 71 >29 mg/dL   VLDL  Cholesterol Cal 29 5 - 40 mg/dL   LDL Chol Calc (NIH) 518 (H) 0 - 99 mg/dL  Comprehensive metabolic panel  Result Value Ref Range   Glucose 121 (H) 70 - 99 mg/dL   BUN 13 8 - 27 mg/dL   Creatinine, Ser 8.41 0.57 - 1.00 mg/dL   eGFR  62 >59 mL/min/1.73   BUN/Creatinine Ratio 14 12 - 28   Sodium 142 134 - 144 mmol/L   Potassium 4.6 3.5 - 5.2 mmol/L   Chloride 102 96 - 106 mmol/L   CO2 23 20 - 29 mmol/L   Calcium 10.0 8.7 - 10.3 mg/dL   Total Protein 7.1 6.0 - 8.5 g/dL   Albumin 4.5 3.8 - 4.8 g/dL   Globulin, Total 2.6 1.5 - 4.5 g/dL   Albumin/Globulin Ratio 1.7 1.2 - 2.2   Bilirubin Total 0.2 0.0 - 1.2 mg/dL   Alkaline Phosphatase 74 44 - 121 IU/L   AST 18 0 - 40 IU/L   ALT 16 0 - 32 IU/L  VITAMIN D 25 Hydroxy (Vit-D Deficiency, Fractures)  Result Value Ref Range   Vit D, 25-Hydroxy 39.2 30.0 - 100.0 ng/mL      Assessment & Plan:   Problem List Items Addressed This Visit       Musculoskeletal and Integument   Degenerative joint disease (DJD) of lumbar spine    Has been on tramadol from neurology but have not been able to get refill. Refill sent today.       Relevant Medications   traMADol (ULTRAM) 50 MG tablet     Genitourinary   Benign hypertensive renal disease    Under good control on current regimen. Continue current regimen. Continue to monitor. Call with any concerns. Refills given. Labs drawn today.       Relevant Orders   CBC with Differential/Platelet   Comprehensive metabolic panel   TSH   Microalbumin, Urine Waived     Other   Anxiety - Primary    See discussion under depression.      Relevant Medications   buPROPion (WELLBUTRIN XL) 150 MG 24 hr tablet   DULoxetine (CYMBALTA) 60 MG capsule   Other Relevant Orders   CBC with Differential/Platelet   Comprehensive metabolic panel   Hyperlipidemia    Under good control on current regimen. Continue current regimen. Continue to monitor. Call with any concerns. Refills given. Labs drawn today.        Relevant Medications   amLODipine (NORVASC) 2.5 MG tablet   atorvastatin (LIPITOR) 40 MG tablet   losartan (COZAAR) 50 MG tablet   Other Relevant Orders   CBC with Differential/Platelet   Comprehensive metabolic panel   Lipid Panel w/o Chol/HDL Ratio   Vitamin D deficiency disease    Rechecking labs today. Await results. Treat as needed.       Relevant Orders   CBC with Differential/Platelet   Comprehensive metabolic panel   VITAMIN D 25 Hydroxy (Vit-D Deficiency, Fractures)   MDD (major depressive disorder), recurrent episode, moderate (HCC)    In exacerbation with the recent loss of her son. Will continue current regimen and add in wellbutrin. Recheck in about 6 weeks. Call with any concerns.       Relevant Medications   buPROPion (WELLBUTRIN XL) 150 MG 24 hr tablet   DULoxetine (CYMBALTA) 60 MG capsule   Other Relevant Orders   CBC with Differential/Platelet   Comprehensive metabolic panel   Other Visit Diagnoses     Needs flu shot       Relevant Orders   Flu Vaccine Trivalent High Dose (Fluad)        Follow up plan: Return in about 6 weeks (around 06/13/2023).

## 2023-05-02 NOTE — Assessment & Plan Note (Signed)
Has been on tramadol from neurology but have not been able to get refill. Refill sent today.

## 2023-05-02 NOTE — Assessment & Plan Note (Signed)
In exacerbation with the recent loss of her son. Will continue current regimen and add in wellbutrin. Recheck in about 6 weeks. Call with any concerns.

## 2023-05-02 NOTE — Assessment & Plan Note (Signed)
See discussion under depression.  ?

## 2023-05-03 LAB — COMPREHENSIVE METABOLIC PANEL
ALT: 37 IU/L — ABNORMAL HIGH (ref 0–32)
AST: 36 IU/L (ref 0–40)
Albumin: 4.1 g/dL (ref 3.7–4.7)
Alkaline Phosphatase: 79 IU/L (ref 44–121)
BUN/Creatinine Ratio: 23 (ref 12–28)
BUN: 18 mg/dL (ref 8–27)
Bilirubin Total: 0.2 mg/dL (ref 0.0–1.2)
CO2: 22 mmol/L (ref 20–29)
Calcium: 9.3 mg/dL (ref 8.7–10.3)
Chloride: 101 mmol/L (ref 96–106)
Creatinine, Ser: 0.79 mg/dL (ref 0.57–1.00)
Globulin, Total: 2.5 g/dL (ref 1.5–4.5)
Glucose: 172 mg/dL — ABNORMAL HIGH (ref 70–99)
Potassium: 4.5 mmol/L (ref 3.5–5.2)
Sodium: 138 mmol/L (ref 134–144)
Total Protein: 6.6 g/dL (ref 6.0–8.5)
eGFR: 75 mL/min/{1.73_m2} (ref >59)

## 2023-05-03 LAB — CBC WITH DIFFERENTIAL/PLATELET
Basophils Absolute: 0 10*3/uL (ref 0.0–0.2)
Basos: 0 %
EOS (ABSOLUTE): 0.3 10*3/uL (ref 0.0–0.4)
Eos: 4 %
Hematocrit: 40.9 % (ref 34.0–46.6)
Hemoglobin: 13.5 g/dL (ref 11.1–15.9)
Immature Grans (Abs): 0 10*3/uL (ref 0.0–0.1)
Immature Granulocytes: 1 %
Lymphocytes Absolute: 1.8 10*3/uL (ref 0.7–3.1)
Lymphs: 28 %
MCH: 32.6 pg (ref 26.6–33.0)
MCHC: 33 g/dL (ref 31.5–35.7)
MCV: 99 fL — ABNORMAL HIGH (ref 79–97)
Monocytes Absolute: 0.8 10*3/uL (ref 0.1–0.9)
Monocytes: 13 %
Neutrophils Absolute: 3.4 10*3/uL (ref 1.4–7.0)
Neutrophils: 54 %
Platelets: 201 10*3/uL (ref 150–450)
RBC: 4.14 x10E6/uL (ref 3.77–5.28)
RDW: 11.5 % — ABNORMAL LOW (ref 11.7–15.4)
WBC: 6.3 10*3/uL (ref 3.4–10.8)

## 2023-05-03 LAB — LIPID PANEL W/O CHOL/HDL RATIO
Cholesterol, Total: 170 mg/dL (ref 100–199)
HDL: 61 mg/dL (ref >39)
LDL Chol Calc (NIH): 77 mg/dL (ref 0–99)
Triglycerides: 190 mg/dL — ABNORMAL HIGH (ref 0–149)
VLDL Cholesterol Cal: 32 mg/dL (ref 5–40)

## 2023-05-03 LAB — TSH: TSH: 3.97 u[IU]/mL (ref 0.450–4.500)

## 2023-05-03 LAB — VITAMIN D 25 HYDROXY (VIT D DEFICIENCY, FRACTURES): Vit D, 25-Hydroxy: 40.1 ng/mL (ref 30.0–100.0)

## 2023-05-08 DIAGNOSIS — M4807 Spinal stenosis, lumbosacral region: Secondary | ICD-10-CM | POA: Diagnosis not present

## 2023-05-08 DIAGNOSIS — M5136 Other intervertebral disc degeneration, lumbar region: Secondary | ICD-10-CM | POA: Diagnosis not present

## 2023-05-09 ENCOUNTER — Other Ambulatory Visit: Payer: Self-pay | Admitting: Family Medicine

## 2023-05-09 DIAGNOSIS — M5416 Radiculopathy, lumbar region: Secondary | ICD-10-CM

## 2023-06-07 ENCOUNTER — Ambulatory Visit
Admission: RE | Admit: 2023-06-07 | Discharge: 2023-06-07 | Disposition: A | Discharge status: Home / Self Care | Payer: Medicare Other | Source: Ambulatory Visit | Attending: Family Medicine | Admitting: Family Medicine

## 2023-06-07 DIAGNOSIS — G8929 Other chronic pain: Secondary | ICD-10-CM | POA: Diagnosis not present

## 2023-06-07 DIAGNOSIS — M5416 Radiculopathy, lumbar region: Secondary | ICD-10-CM | POA: Diagnosis not present

## 2023-06-13 ENCOUNTER — Ambulatory Visit: Payer: Medicare Other | Admitting: Family Medicine

## 2023-06-13 VITALS — BP 116/80 | HR 92 | Ht 61.0 in | Wt 180.4 lb

## 2023-06-13 DIAGNOSIS — M4726 Other spondylosis with radiculopathy, lumbar region: Secondary | ICD-10-CM

## 2023-06-13 DIAGNOSIS — F331 Major depressive disorder, recurrent, moderate: Secondary | ICD-10-CM | POA: Diagnosis not present

## 2023-06-13 DIAGNOSIS — F419 Anxiety disorder, unspecified: Secondary | ICD-10-CM | POA: Diagnosis not present

## 2023-06-13 MED ORDER — BUPROPION HCL ER (XL) 150 MG PO TB24: 150.0000 mg | ORAL_TABLET | Freq: Every day | ORAL | 1 refills | Status: DC

## 2023-06-13 NOTE — Assessment & Plan Note (Signed)
To start PT. Continue to follow with PM&R

## 2023-06-13 NOTE — Assessment & Plan Note (Signed)
Improved, but not great. Will stay on current regimen. Still grieving loss of son in August. Recheck 3 months. Call if having crying spells again.

## 2023-06-13 NOTE — Progress Notes (Signed)
BP 116/80   Pulse 92   Ht 5\' 1"  (1.549 m)   Wt 180 lb 6.4 oz (81.8 kg)   LMP  (LMP Unknown)   SpO2 94%   BMI 34.09 kg/m    Subjective:    Patient ID: Cynthia Vaughan, female    DOB: 11-07-1940, 82 y.o.   MRN: [id number]  HPI: Cynthia Vaughan is a 82 y.o. female  Chief Complaint  Patient presents with   Anxiety    Patient husband says everything is still about the same.    ANXIETY/DEPRESSION Duration: chronic Status:stable Anxious mood: yes  Excessive worrying: no Irritability: yes  Sweating: no Nausea: no Palpitations:no Hyperventilation: no Panic attacks: no Agoraphobia: no  Obscessions/compulsions: no Depressed mood: yes    06/13/2023    1:47 PM 05/02/2023    3:33 PM 01/18/2023   11:03 AM 10/20/2022    2:51 PM 09/29/2022    2:06 PM  Depression screen PHQ 2/9  Decreased Interest 2 3 2 2 3   Down, Depressed, Hopeless 1 2 1 1 3   PHQ - 2 Score 3 5 3 3 6   Altered sleeping 1 3 1 2 3   Tired, decreased energy 3 3 2 2 3   Change in appetite 2 3 2 3 3   Feeling bad or failure about yourself  1 1 1 1 2   Trouble concentrating 1 1 1 1 2   Moving slowly or fidgety/restless 0 1 0 0 2  Suicidal thoughts 0 0 0 0 0  PHQ-9 Score 11 17 10 12 21   Difficult doing work/chores Somewhat difficult Very difficult Very difficult Somewhat difficult Extremely dIfficult      06/13/2023    1:47 PM 05/02/2023    3:34 PM 01/18/2023   11:04 AM 10/20/2022    2:51 PM  GAD 7 : Generalized Anxiety Score  Nervous, Anxious, on Edge 1 1 1 1   Control/stop worrying 1 1 1 1   Worry too much - different things 0 1 2 1   Trouble relaxing 1 0 1 0  Restless 0 0 0 0  Easily annoyed or irritable 1 1 0 1  Afraid - awful might happen 0 1 2 1   Total GAD 7 Score 4 5 7 5   Anxiety Difficulty Not difficult at all Somewhat difficult Somewhat difficult Somewhat difficult   Anhedonia: no Weight changes: no Insomnia: yes   Hypersomnia: no Fatigue/loss of energy: yes Feelings of worthlessness: yes Feelings of  guilt: yes Impaired concentration/indecisiveness: no Suicidal ideations: no  Crying spells: no Recent Stressors/Life Changes: yes   Relationship problems: no   Family stress: no     Financial stress: no    Job stress: no    Recent death/loss: yes  Relevant past medical, surgical, family and social history reviewed and updated as indicated. Interim medical history since our last visit reviewed. Allergies and medications reviewed and updated.  Review of Systems  Constitutional: Negative.   Respiratory: Negative.    Cardiovascular: Negative.   Gastrointestinal: Negative.   Musculoskeletal:  Positive for arthralgias, back pain and myalgias. Negative for gait problem, joint swelling, neck pain and neck stiffness.  Psychiatric/Behavioral:  Positive for dysphoric mood. Negative for agitation, behavioral problems, confusion, decreased concentration, hallucinations, self-injury, sleep disturbance and suicidal ideas. The patient is nervous/anxious. The patient is not hyperactive.     Per HPI unless specifically indicated above     Objective:    BP 116/80   Pulse 92   Ht 5\' 1"  (1.549 m)   Wt  180 lb 6.4 oz (81.8 kg)   LMP  (LMP Unknown)   SpO2 94%   BMI 34.09 kg/m   Wt Readings from Last 3 Encounters:  06/13/23 180 lb 6.4 oz (81.8 kg)  05/02/23 181 lb (82.1 kg)  01/18/23 161 lb 12.8 oz (73.4 kg)    Physical Exam Vitals and nursing note reviewed.  Constitutional:      General: She is not in acute distress.    Appearance: Normal appearance. She is not ill-appearing, toxic-appearing or diaphoretic.  HENT:     Head: Normocephalic and atraumatic.     Right Ear: External ear normal.     Left Ear: External ear normal.     Nose: Nose normal.     Mouth/Throat:     Mouth: Mucous membranes are moist.     Pharynx: Oropharynx is clear.  Eyes:     General: No scleral icterus.       Right eye: No discharge.        Left eye: No discharge.     Extraocular Movements: Extraocular movements  intact.     Conjunctiva/sclera: Conjunctivae normal.     Pupils: Pupils are equal, round, and reactive to light.  Cardiovascular:     Rate and Rhythm: Normal rate and regular rhythm.     Pulses: Normal pulses.     Heart sounds: Normal heart sounds. No murmur heard.    No friction rub. No gallop.  Pulmonary:     Effort: Pulmonary effort is normal. No respiratory distress.     Breath sounds: Normal breath sounds. No stridor. No wheezing, rhonchi or rales.  Chest:     Chest wall: No tenderness.  Musculoskeletal:        General: Normal range of motion.     Cervical back: Normal range of motion and neck supple.  Skin:    General: Skin is warm and dry.     Capillary Refill: Capillary refill takes less than 2 seconds.     Coloration: Skin is not jaundiced or pale.     Findings: No bruising, erythema, lesion or rash.  Neurological:     General: No focal deficit present.     Mental Status: She is alert and oriented to person, place, and time. Mental status is at baseline.  Psychiatric:        Mood and Affect: Mood normal.        Behavior: Behavior normal.        Thought Content: Thought content normal.        Judgment: Judgment normal.     Results for orders placed or performed in visit on 05/02/23  CBC with Differential/Platelet  Result Value Ref Range   WBC 6.3 3.4 - 10.8 x10E3/uL   RBC 4.14 3.77 - 5.28 x10E6/uL   Hemoglobin 13.5 11.1 - 15.9 g/dL   Hematocrit 16.1 09.6 - 46.6 %   MCV 99 (H) 79 - 97 fL   MCH 32.6 26.6 - 33.0 pg   MCHC 33.0 31.5 - 35.7 g/dL   RDW 04.5 (L) 40.9 - 81.1 %   Platelets 201 150 - 450 x10E3/uL   Neutrophils 54 Not Estab. %   Lymphs 28 Not Estab. %   Monocytes 13 Not Estab. %   Eos 4 Not Estab. %   Basos 0 Not Estab. %   Neutrophils Absolute 3.4 1.4 - 7.0 x10E3/uL   Lymphocytes Absolute 1.8 0.7 - 3.1 x10E3/uL   Monocytes Absolute 0.8 0.1 - 0.9 x10E3/uL   EOS (  ABSOLUTE) 0.3 0.0 - 0.4 x10E3/uL   Basophils Absolute 0.0 0.0 - 0.2 x10E3/uL   Immature  Granulocytes 1 Not Estab. %   Immature Grans (Abs) 0.0 0.0 - 0.1 x10E3/uL  Comprehensive metabolic panel  Result Value Ref Range   Glucose 172 (H) 70 - 99 mg/dL   BUN 18 8 - 27 mg/dL   Creatinine, Ser 0.45 0.57 - 1.00 mg/dL   eGFR 75 >40 JW/JXB/1.47   BUN/Creatinine Ratio 23 12 - 28   Sodium 138 134 - 144 mmol/L   Potassium 4.5 3.5 - 5.2 mmol/L   Chloride 101 96 - 106 mmol/L   CO2 22 20 - 29 mmol/L   Calcium 9.3 8.7 - 10.3 mg/dL   Total Protein 6.6 6.0 - 8.5 g/dL   Albumin 4.1 3.7 - 4.7 g/dL   Globulin, Total 2.5 1.5 - 4.5 g/dL   Bilirubin Total 0.2 0.0 - 1.2 mg/dL   Alkaline Phosphatase 79 44 - 121 IU/L   AST 36 0 - 40 IU/L   ALT 37 (H) 0 - 32 IU/L  Lipid Panel w/o Chol/HDL Ratio  Result Value Ref Range   Cholesterol, Total 170 100 - 199 mg/dL   Triglycerides 829 (H) 0 - 149 mg/dL   HDL 61 >56 mg/dL   VLDL Cholesterol Cal 32 5 - 40 mg/dL   LDL Chol Calc (NIH) 77 0 - 99 mg/dL  TSH  Result Value Ref Range   TSH 3.970 0.450 - 4.500 uIU/mL  VITAMIN D 25 Hydroxy (Vit-D Deficiency, Fractures)  Result Value Ref Range   Vit D, 25-Hydroxy 40.1 30.0 - 100.0 ng/mL  Microalbumin, Urine Waived  Result Value Ref Range   Microalb, Ur Waived 150 (H) 0 - 19 mg/L   Creatinine, Urine Waived 300 10 - 300 mg/dL   Microalb/Creat Ratio 30-300 (H) <30 mg/g      Assessment & Plan:   Problem List Items Addressed This Visit       Musculoskeletal and Integument   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 Continue to follow with PM&R        Other   Anxiety    Improved, but not great. Will stay on current regimen. Still grieving loss of son in August. Recheck 3 months. Call if having crying spells again.       Relevant Medications   buPROPion (WELLBUTRIN XL) 150 MG 24 hr tablet   MDD (major depressive disorder), recurrent episode, moderate (HCC)    Improved, but not great. Will stay on current regimen. Still grieving loss of son in August. Recheck 3  months. Call if having crying spells again.       Relevant Medications   buPROPion (WELLBUTRIN XL) 150 MG 24 hr tablet     Follow up plan: Return in about 3 months (around 09/13/2023).

## 2023-06-21 ENCOUNTER — Other Ambulatory Visit: Payer: Self-pay

## 2023-06-21 DIAGNOSIS — F0394 Unspecified dementia, unspecified severity, with anxiety: Secondary | ICD-10-CM | POA: Diagnosis present

## 2023-06-21 DIAGNOSIS — Z888 Allergy status to other drugs, medicaments and biological substances status: Secondary | ICD-10-CM | POA: Diagnosis not present

## 2023-06-21 DIAGNOSIS — Z1152 Encounter for screening for COVID-19: Secondary | ICD-10-CM

## 2023-06-21 DIAGNOSIS — I129 Hypertensive chronic kidney disease with stage 1 through stage 4 chronic kidney disease, or unspecified chronic kidney disease: Secondary | ICD-10-CM | POA: Diagnosis present

## 2023-06-21 DIAGNOSIS — F32A Depression, unspecified: Secondary | ICD-10-CM | POA: Diagnosis present

## 2023-06-21 DIAGNOSIS — S0083XA Contusion of other part of head, initial encounter: Secondary | ICD-10-CM | POA: Diagnosis present

## 2023-06-21 DIAGNOSIS — Z885 Allergy status to narcotic agent status: Secondary | ICD-10-CM | POA: Diagnosis not present

## 2023-06-21 DIAGNOSIS — Z85828 Personal history of other malignant neoplasm of skin: Secondary | ICD-10-CM | POA: Diagnosis not present

## 2023-06-21 DIAGNOSIS — I2510 Atherosclerotic heart disease of native coronary artery without angina pectoris: Secondary | ICD-10-CM | POA: Diagnosis present

## 2023-06-21 DIAGNOSIS — S0993XA Unspecified injury of face, initial encounter: Secondary | ICD-10-CM | POA: Diagnosis not present

## 2023-06-21 DIAGNOSIS — Z87891 Personal history of nicotine dependence: Secondary | ICD-10-CM

## 2023-06-21 DIAGNOSIS — R0602 Shortness of breath: Secondary | ICD-10-CM | POA: Diagnosis not present

## 2023-06-21 DIAGNOSIS — Z6831 Body mass index (BMI) 31.0-31.9, adult: Secondary | ICD-10-CM

## 2023-06-21 DIAGNOSIS — R531 Weakness: Secondary | ICD-10-CM | POA: Diagnosis not present

## 2023-06-21 DIAGNOSIS — S0990XA Unspecified injury of head, initial encounter: Secondary | ICD-10-CM | POA: Diagnosis not present

## 2023-06-21 DIAGNOSIS — R000 Tachycardia, unspecified: Secondary | ICD-10-CM | POA: Diagnosis not present

## 2023-06-21 DIAGNOSIS — N182 Chronic kidney disease, stage 2 (mild): Secondary | ICD-10-CM | POA: Diagnosis present

## 2023-06-21 DIAGNOSIS — E669 Obesity, unspecified: Secondary | ICD-10-CM | POA: Diagnosis present

## 2023-06-21 DIAGNOSIS — H1033 Unspecified acute conjunctivitis, bilateral: Secondary | ICD-10-CM | POA: Diagnosis present

## 2023-06-21 DIAGNOSIS — J0100 Acute maxillary sinusitis, unspecified: Secondary | ICD-10-CM | POA: Diagnosis not present

## 2023-06-21 DIAGNOSIS — F0393 Unspecified dementia, unspecified severity, with mood disturbance: Secondary | ICD-10-CM | POA: Diagnosis present

## 2023-06-21 DIAGNOSIS — R0902 Hypoxemia: Secondary | ICD-10-CM | POA: Diagnosis not present

## 2023-06-21 DIAGNOSIS — J9601 Acute respiratory failure with hypoxia: Secondary | ICD-10-CM | POA: Diagnosis not present

## 2023-06-21 DIAGNOSIS — Z9842 Cataract extraction status, left eye: Secondary | ICD-10-CM

## 2023-06-21 DIAGNOSIS — Z79899 Other long term (current) drug therapy: Secondary | ICD-10-CM | POA: Diagnosis not present

## 2023-06-21 DIAGNOSIS — Z66 Do not resuscitate: Secondary | ICD-10-CM | POA: Diagnosis present

## 2023-06-21 DIAGNOSIS — J209 Acute bronchitis, unspecified: Secondary | ICD-10-CM | POA: Diagnosis present

## 2023-06-21 DIAGNOSIS — E785 Hyperlipidemia, unspecified: Secondary | ICD-10-CM | POA: Diagnosis present

## 2023-06-21 DIAGNOSIS — Z823 Family history of stroke: Secondary | ICD-10-CM

## 2023-06-21 DIAGNOSIS — J189 Pneumonia, unspecified organism: Secondary | ICD-10-CM | POA: Diagnosis not present

## 2023-06-21 DIAGNOSIS — I77819 Aortic ectasia, unspecified site: Secondary | ICD-10-CM | POA: Diagnosis not present

## 2023-06-21 DIAGNOSIS — Z7982 Long term (current) use of aspirin: Secondary | ICD-10-CM | POA: Diagnosis not present

## 2023-06-21 DIAGNOSIS — Z90710 Acquired absence of both cervix and uterus: Secondary | ICD-10-CM

## 2023-06-21 DIAGNOSIS — I959 Hypotension, unspecified: Secondary | ICD-10-CM | POA: Diagnosis not present

## 2023-06-21 DIAGNOSIS — J0101 Acute recurrent maxillary sinusitis: Secondary | ICD-10-CM | POA: Diagnosis not present

## 2023-06-21 DIAGNOSIS — R509 Fever, unspecified: Secondary | ICD-10-CM | POA: Diagnosis not present

## 2023-06-21 DIAGNOSIS — Z9841 Cataract extraction status, right eye: Secondary | ICD-10-CM

## 2023-06-21 DIAGNOSIS — Z961 Presence of intraocular lens: Secondary | ICD-10-CM | POA: Diagnosis present

## 2023-06-21 DIAGNOSIS — W19XXXA Unspecified fall, initial encounter: Secondary | ICD-10-CM | POA: Diagnosis present

## 2023-06-21 DIAGNOSIS — Y92009 Unspecified place in unspecified non-institutional (private) residence as the place of occurrence of the external cause: Secondary | ICD-10-CM | POA: Diagnosis not present

## 2023-06-21 DIAGNOSIS — Z8249 Family history of ischemic heart disease and other diseases of the circulatory system: Secondary | ICD-10-CM

## 2023-06-21 DIAGNOSIS — R059 Cough, unspecified: Secondary | ICD-10-CM | POA: Diagnosis not present

## 2023-06-21 DIAGNOSIS — S199XXA Unspecified injury of neck, initial encounter: Secondary | ICD-10-CM | POA: Diagnosis not present

## 2023-06-21 DIAGNOSIS — F0390 Unspecified dementia without behavioral disturbance: Secondary | ICD-10-CM | POA: Diagnosis not present

## 2023-06-21 DIAGNOSIS — G319 Degenerative disease of nervous system, unspecified: Secondary | ICD-10-CM | POA: Diagnosis not present

## 2023-06-21 NOTE — ED Triage Notes (Signed)
Pt to ED via EMS from home, pt reports cough and congestion xfew days. Pt has drainage coming from bilateral eyes. Pt denies pain. Pts room air sats 89%, pt placed on 2L

## 2023-06-21 NOTE — ED Triage Notes (Signed)
EMS brings pt in from home; prod cough x 5 days and weakness; O2 sat 89% on ra; tylenol taken at 6pm

## 2023-06-22 ENCOUNTER — Emergency Department: Payer: Medicare Other

## 2023-06-22 ENCOUNTER — Inpatient Hospital Stay
Admission: EM | Admit: 2023-06-22 | Discharge: 2023-06-25 | DRG: 189 | Disposition: A | Discharge status: Home Health | Payer: Medicare Other | Attending: Internal Medicine | Admitting: Internal Medicine

## 2023-06-22 DIAGNOSIS — J0100 Acute maxillary sinusitis, unspecified: Secondary | ICD-10-CM

## 2023-06-22 DIAGNOSIS — S0990XA Unspecified injury of head, initial encounter: Secondary | ICD-10-CM | POA: Diagnosis not present

## 2023-06-22 DIAGNOSIS — S199XXA Unspecified injury of neck, initial encounter: Secondary | ICD-10-CM | POA: Diagnosis not present

## 2023-06-22 DIAGNOSIS — I77819 Aortic ectasia, unspecified site: Secondary | ICD-10-CM | POA: Diagnosis not present

## 2023-06-22 DIAGNOSIS — J189 Pneumonia, unspecified organism: Secondary | ICD-10-CM | POA: Diagnosis not present

## 2023-06-22 DIAGNOSIS — Y92009 Unspecified place in unspecified non-institutional (private) residence as the place of occurrence of the external cause: Secondary | ICD-10-CM

## 2023-06-22 DIAGNOSIS — R059 Cough, unspecified: Secondary | ICD-10-CM | POA: Diagnosis not present

## 2023-06-22 DIAGNOSIS — W19XXXA Unspecified fall, initial encounter: Secondary | ICD-10-CM

## 2023-06-22 DIAGNOSIS — J9601 Acute respiratory failure with hypoxia: Principal | ICD-10-CM | POA: Insufficient documentation

## 2023-06-22 DIAGNOSIS — S0993XA Unspecified injury of face, initial encounter: Secondary | ICD-10-CM | POA: Diagnosis not present

## 2023-06-22 DIAGNOSIS — G319 Degenerative disease of nervous system, unspecified: Secondary | ICD-10-CM | POA: Diagnosis not present

## 2023-06-22 DIAGNOSIS — F0390 Unspecified dementia without behavioral disturbance: Secondary | ICD-10-CM

## 2023-06-22 LAB — COMPREHENSIVE METABOLIC PANEL
ALT: 28 U/L (ref 0–44)
AST: 41 U/L (ref 15–41)
Albumin: 3.4 g/dL — ABNORMAL LOW (ref 3.5–5.0)
Alkaline Phosphatase: 65 U/L (ref 38–126)
Anion gap: 8 (ref 5–15)
BUN: 17 mg/dL (ref 8–23)
CO2: 24 mmol/L (ref 22–32)
Calcium: 8.4 mg/dL — ABNORMAL LOW (ref 8.9–10.3)
Chloride: 103 mmol/L (ref 98–111)
Creatinine, Ser: 0.74 mg/dL (ref 0.44–1.00)
GFR, Estimated: >60 mL/min (ref >60)
Glucose, Bld: 156 mg/dL — ABNORMAL HIGH (ref 70–99)
Potassium: 3.8 mmol/L (ref 3.5–5.1)
Sodium: 135 mmol/L (ref 135–145)
Total Bilirubin: 0.5 mg/dL (ref 0.3–1.2)
Total Protein: 7.2 g/dL (ref 6.5–8.1)

## 2023-06-22 LAB — CBC WITH DIFFERENTIAL/PLATELET
Abs Immature Granulocytes: 0.12 10*3/uL — ABNORMAL HIGH (ref 0.00–0.07)
Basophils Absolute: 0.1 10*3/uL (ref 0.0–0.1)
Basophils Relative: 1 %
Eosinophils Absolute: 0.2 10*3/uL (ref 0.0–0.5)
Eosinophils Relative: 2 %
HCT: 40.3 % (ref 36.0–46.0)
Hemoglobin: 13 g/dL (ref 12.0–15.0)
Immature Granulocytes: 2 %
Lymphocytes Relative: 20 %
Lymphs Abs: 1.6 10*3/uL (ref 0.7–4.0)
MCH: 32.2 pg (ref 26.0–34.0)
MCHC: 32.3 g/dL (ref 30.0–36.0)
MCV: 99.8 fL (ref 80.0–100.0)
Monocytes Absolute: 1.6 10*3/uL — ABNORMAL HIGH (ref 0.1–1.0)
Monocytes Relative: 20 %
Neutro Abs: 4.3 10*3/uL (ref 1.7–7.7)
Neutrophils Relative %: 55 %
Platelets: 193 10*3/uL (ref 150–400)
RBC: 4.04 MIL/uL (ref 3.87–5.11)
RDW: 12.2 % (ref 11.5–15.5)
WBC: 7.9 10*3/uL (ref 4.0–10.5)
nRBC: 0 % (ref 0.0–0.2)

## 2023-06-22 LAB — RESPIRATORY PANEL BY PCR

## 2023-06-22 LAB — RESP PANEL BY RT-PCR (RSV, FLU A&B, COVID)  RVPGX2
Influenza A by PCR: NEGATIVE
Influenza B by PCR: NEGATIVE
Resp Syncytial Virus by PCR: NEGATIVE
SARS Coronavirus 2 by RT PCR: NEGATIVE

## 2023-06-22 LAB — TROPONIN I (HIGH SENSITIVITY)
Troponin I (High Sensitivity): 7 ng/L (ref <18)
Troponin I (High Sensitivity): 8 ng/L (ref <18)

## 2023-06-22 MED ORDER — DONEPEZIL HCL 5 MG PO TABS
10.0000 mg | ORAL_TABLET | Freq: Every day | ORAL | Status: DC
  Administered 2023-06-22 – 2023-06-24 (×3): 10 mg via ORAL
  Filled 2023-06-22 (×3): qty 2

## 2023-06-22 MED ORDER — ENOXAPARIN SODIUM 40 MG/0.4ML IJ SOSY
40.0000 mg | PREFILLED_SYRINGE | INTRAMUSCULAR | Status: DC
  Administered 2023-06-22 – 2023-06-24 (×3): 40 mg via SUBCUTANEOUS
  Filled 2023-06-22 (×3): qty 0.4

## 2023-06-22 MED ORDER — BUPROPION HCL ER (XL) 150 MG PO TB24
150.0000 mg | ORAL_TABLET | Freq: Every day | ORAL | Status: DC
  Administered 2023-06-23 – 2023-06-25 (×3): 150 mg via ORAL
  Filled 2023-06-22 (×3): qty 1

## 2023-06-22 MED ORDER — ATORVASTATIN CALCIUM 20 MG PO TABS
40.0000 mg | ORAL_TABLET | Freq: Every day | ORAL | Status: DC
  Administered 2023-06-23 – 2023-06-25 (×3): 40 mg via ORAL
  Filled 2023-06-22 (×3): qty 2

## 2023-06-22 MED ORDER — PANTOPRAZOLE SODIUM 40 MG PO TBEC
40.0000 mg | DELAYED_RELEASE_TABLET | Freq: Every day | ORAL | Status: DC
  Administered 2023-06-22 – 2023-06-25 (×4): 40 mg via ORAL
  Filled 2023-06-22 (×4): qty 1

## 2023-06-22 MED ORDER — MIRTAZAPINE 15 MG PO TABS
15.0000 mg | ORAL_TABLET | Freq: Every day | ORAL | Status: DC
  Administered 2023-06-22 – 2023-06-24 (×3): 15 mg via ORAL
  Filled 2023-06-22 (×3): qty 1

## 2023-06-22 MED ORDER — SODIUM CHLORIDE 0.9 % IV SOLN
500.0000 mg | Freq: Once | INTRAVENOUS | Status: AC
  Administered 2023-06-22: 500 mg via INTRAVENOUS

## 2023-06-22 MED ORDER — GUAIFENESIN ER 600 MG PO TB12
600.0000 mg | ORAL_TABLET | Freq: Two times a day (BID) | ORAL | Status: DC
  Administered 2023-06-22 – 2023-06-25 (×7): 600 mg via ORAL
  Filled 2023-06-22 (×7): qty 1

## 2023-06-22 MED ORDER — QUETIAPINE FUMARATE 25 MG PO TABS
25.0000 mg | ORAL_TABLET | Freq: Every day | ORAL | Status: DC
  Administered 2023-06-22 – 2023-06-24 (×3): 25 mg via ORAL
  Filled 2023-06-22 (×3): qty 1

## 2023-06-22 MED ORDER — GABAPENTIN 100 MG PO CAPS
100.0000 mg | ORAL_CAPSULE | Freq: Every day | ORAL | Status: DC
  Administered 2023-06-22 – 2023-06-25 (×4): 100 mg via ORAL
  Filled 2023-06-22 (×4): qty 1

## 2023-06-22 MED ORDER — LORATADINE 10 MG PO TABS
10.0000 mg | ORAL_TABLET | Freq: Every day | ORAL | Status: DC
  Administered 2023-06-22 – 2023-06-25 (×4): 10 mg via ORAL
  Filled 2023-06-22 (×4): qty 1

## 2023-06-22 MED ORDER — AMLODIPINE BESYLATE 5 MG PO TABS
2.5000 mg | ORAL_TABLET | Freq: Every day | ORAL | Status: DC
  Administered 2023-06-23 – 2023-06-25 (×3): 2.5 mg via ORAL
  Filled 2023-06-22 (×3): qty 1

## 2023-06-22 MED ORDER — AZITHROMYCIN 500 MG PO TABS
500.0000 mg | ORAL_TABLET | Freq: Every day | ORAL | Status: DC
  Administered 2023-06-23: 500 mg via ORAL
  Filled 2023-06-22: qty 1

## 2023-06-22 MED ORDER — ASPIRIN 81 MG PO TBEC
81.0000 mg | DELAYED_RELEASE_TABLET | Freq: Every day | ORAL | Status: DC
  Administered 2023-06-22 – 2023-06-25 (×4): 81 mg via ORAL
  Filled 2023-06-22 (×4): qty 1

## 2023-06-22 MED ORDER — GABAPENTIN 300 MG PO CAPS
300.0000 mg | ORAL_CAPSULE | Freq: Every day | ORAL | Status: DC
  Administered 2023-06-22 – 2023-06-24 (×3): 300 mg via ORAL
  Filled 2023-06-22 (×3): qty 1

## 2023-06-22 MED ORDER — SODIUM CHLORIDE 0.9 % IV SOLN
2.0000 g | Freq: Once | INTRAVENOUS | Status: AC
Start: 2023-06-22 — End: 2023-06-22
  Administered 2023-06-22: 2 g via INTRAVENOUS
  Filled 2023-06-22: qty 20

## 2023-06-22 MED ORDER — ACETAMINOPHEN 500 MG PO TABS: 500.0000 mg | ORAL_TABLET | Freq: Four times a day (QID) | ORAL | Status: DC | PRN

## 2023-06-22 MED ORDER — DULOXETINE HCL 30 MG PO CPEP
60.0000 mg | ORAL_CAPSULE | Freq: Every day | ORAL | Status: DC
  Administered 2023-06-23 – 2023-06-25 (×3): 60 mg via ORAL
  Filled 2023-06-22 (×3): qty 2

## 2023-06-22 MED ORDER — MEMANTINE HCL 5 MG PO TABS
10.0000 mg | ORAL_TABLET | Freq: Every day | ORAL | Status: DC
  Administered 2023-06-23 – 2023-06-25 (×3): 10 mg via ORAL
  Filled 2023-06-22 (×3): qty 2

## 2023-06-22 MED ORDER — BENZONATATE 100 MG PO CAPS
100.0000 mg | ORAL_CAPSULE | Freq: Three times a day (TID) | ORAL | Status: DC | PRN
  Administered 2023-06-24: 100 mg via ORAL
  Filled 2023-06-22: qty 1

## 2023-06-22 MED ORDER — ONDANSETRON HCL 4 MG PO TABS: 4.0000 mg | ORAL_TABLET | Freq: Four times a day (QID) | ORAL | Status: DC | PRN

## 2023-06-22 MED ORDER — TRAZODONE HCL 50 MG PO TABS
25.0000 mg | ORAL_TABLET | Freq: Every evening | ORAL | Status: DC | PRN
  Administered 2023-06-24: 25 mg via ORAL
  Filled 2023-06-22: qty 1

## 2023-06-22 MED ORDER — FLUTICASONE PROPIONATE 50 MCG/ACT NA SUSP
2.0000 | Freq: Every day | NASAL | Status: DC
  Administered 2023-06-23: 2 via NASAL
  Filled 2023-06-22: qty 16

## 2023-06-22 MED ORDER — POLYMYXIN B-TRIMETHOPRIM 10000-0.1 UNIT/ML-% OP SOLN
1.0000 [drp] | OPHTHALMIC | Status: DC
  Administered 2023-06-22 – 2023-06-25 (×13): 1 [drp] via OPHTHALMIC
  Filled 2023-06-22: qty 10

## 2023-06-22 MED ORDER — LOSARTAN POTASSIUM 50 MG PO TABS
50.0000 mg | ORAL_TABLET | Freq: Every day | ORAL | Status: DC
  Administered 2023-06-23 – 2023-06-25 (×3): 50 mg via ORAL
  Filled 2023-06-22 (×3): qty 1

## 2023-06-22 MED ORDER — SODIUM CHLORIDE 0.9 % IV SOLN
2.0000 g | INTRAVENOUS | Status: DC
  Administered 2023-06-23: 2 g via INTRAVENOUS
  Filled 2023-06-22: qty 20

## 2023-06-22 MED ORDER — TRAMADOL HCL 50 MG PO TABS
50.0000 mg | ORAL_TABLET | Freq: Two times a day (BID) | ORAL | Status: DC | PRN
  Administered 2023-06-23 – 2023-06-24 (×3): 50 mg via ORAL
  Filled 2023-06-22 (×3): qty 1

## 2023-06-22 MED ORDER — ONDANSETRON HCL 4 MG/2ML IJ SOLN: 4.0000 mg | Freq: Four times a day (QID) | INTRAMUSCULAR | Status: DC | PRN

## 2023-06-22 NOTE — ED Notes (Signed)
Pt lying on stretcher in triage with no distress noted, accomp by husband; bruising and tenderness noted to rt cheek; pt A&Ox3; husb st pt fell face forward onto floor and that isi why he called EMS due to her new onset weakness; pt denies LOC but does st her head "hurts a little"; add'l orders to be performed in triage

## 2023-06-22 NOTE — ED Notes (Signed)
Pt resting on stretcher in triage, sleeping; resp even/unlab; awakens easily for reassessment; noted pulse ox 88-89% on ra; applied O2 at 2l/min via [place] to bring sat to 95%; pt denies c/o at present

## 2023-06-22 NOTE — Plan of Care (Signed)
  Problem: Clinical Measurements: Goal: Ability to maintain a body temperature in the normal range will improve Outcome: Progressing   Problem: Respiratory: Goal: Ability to maintain a clear airway will improve Outcome: Progressing   Problem: Nutrition: Goal: Adequate nutrition will be maintained Outcome: Progressing   Problem: Safety: Goal: Ability to remain free from injury will improve Outcome: Progressing

## 2023-06-22 NOTE — ED Provider Notes (Signed)
[facility] Provider Note    Event Date/Time   First MD Initiated Contact with Patient 06/22/23 [phone]     (approximate)   History   Chief Complaint: Cough   HPI  Cynthia Vaughan is a 82 y.o. female with a history of CKD, GERD, hypertension who comes to the ED complaining of productive cough, nasal congestion for the past 5 days.  Also has shortness of breath and generalized weakness.  Denies chest pain.  Denies leg swelling.  She has had multiple falls during this time due to generalized weakness.     Physical Exam   Triage Vital Signs: ED Triage Vitals  Encounter Vitals Group     BP 06/21/23 2349 (!) 140/75     Systolic BP Percentile --      Diastolic BP Percentile --      Pulse Rate 06/21/23 2345 (!) 107     Resp 06/21/23 2345 20     Temp 06/21/23 2347 98.5 F (36.9 C)     Temp Source 06/21/23 2347 Oral     SpO2 06/21/23 2335 97 %     Weight 06/21/23 2345 185 lb (83.9 kg)     Height 06/21/23 2345 5\' 2"  (1.575 m)     Head Circumference --      Peak Flow --      Pain Score 06/21/23 2345 0     Pain Loc --      Pain Education --      Exclude from Growth Chart --     Most recent vital signs: Vitals:   06/22/23 0534 06/22/23 0755  BP:  126/70  Pulse:  96  Resp:  18  Temp:    SpO2: 95% 97%    General: Awake, no distress.  CV:  Good peripheral perfusion.  Regular rate and rhythm Resp:  Normal effort.  Good air entry.  Right basilar crackles.  No wheezing. Abd:  No distention.  Soft nontender Other:  No focal bony tenderness or deformity. Thick exudate bilateral eyes   ED Results / Procedures / Treatments   Labs (all labs ordered are listed, but only abnormal results are displayed) Labs Reviewed  CBC WITH DIFFERENTIAL/PLATELET - Abnormal; Notable for the following components:      Result Value   Monocytes Absolute 1.6 (*)    Abs Immature Granulocytes 0.12 (*)    All other components within normal limits  COMPREHENSIVE METABOLIC  PANEL - Abnormal; Notable for the following components:   Glucose, Bld 156 (*)    Calcium 8.4 (*)    Albumin 3.4 (*)    All other components within normal limits  RESP PANEL BY RT-PCR (RSV, FLU A&B, COVID)  [id number]  URINALYSIS, ROUTINE W REFLEX MICROSCOPIC  TROPONIN I (HIGH SENSITIVITY)  TROPONIN I (HIGH SENSITIVITY)     EKG Interpreted by me Normal sinus rhythm rate of 96.  Normal axis, normal intervals.  Normal QRS ST segments and T waves.   RADIOLOGY Chest x-ray interpreted by me, lungs appear clear.  Radiology report reviewed.  CT head, C-spine, maxillofacial negative for fracture or other acute traumatic injuries.  She does have acute sinusitis.   PROCEDURES:  Procedures   MEDICATIONS ORDERED IN ED: Medications  cefTRIAXone (ROCEPHIN) 2 g in sodium chloride 0.9 % 100 mL IVPB (has no administration in time range)  azithromycin (ZITHROMAX) 500 mg in sodium chloride 0.9 % 250 mL IVPB (has no administration in time range)     IMPRESSION / MDM /  ASSESSMENT AND PLAN / ED COURSE  I reviewed the triage vital signs and the nursing notes.  DDx: Pneumonia, sinusitis, electrolyte abnormality, AKI, COVID, influenza, myocarditis  Patient's presentation is most consistent with acute presentation with potential threat to life or bodily function.  Patient presents with cough, congestion, bilateral conjunctivitis with thick exudate.  Workup reveals evidence of acute sinusitis.  Also suspect she is developing pneumonia with her hypoxia at 88% on room air and shortness of breath.  Will start on Rocephin and azithromycin, need to admit due to hypoxic respiratory failure, age, acute functional decline   ----------------------------------------- 10:07 AM on 06/22/2023 ----------------------------------------- Discussed with hospitalist      FINAL CLINICAL IMPRESSION(S) / ED DIAGNOSES   Final diagnoses:  Acute respiratory failure with hypoxia (HCC)  Acute maxillary sinusitis,  recurrence not specified     Rx / DC Orders   ED Discharge Orders     None        Note:  This document was prepared using Dragon voice recognition software and may include unintentional dictation errors.   Sharman Cheek, MD 06/22/23 1007

## 2023-06-22 NOTE — H&P (Signed)
History and Physical    Cynthia Vaughan MVH:[id number] DOB: Nov 11, 1940 DOA: 06/22/2023  PCP: Dorcas Carrow, DO (Confirm with patient/family/NH records and if not entered, this has to be entered at [facility] point of entry) Patient coming from: Home  I have personally briefly reviewed patient's old medical records in [facility]  Chief Complaint: Cough, SOB  HPI: Cynthia Vaughan is a 82 y.o. female with medical history significant of advanced dementia, HTN, HLD, anxiety/depression, brought in by family member for evaluation of worsening of productive cough.  Patient has advanced dementia unable to provide any history, all history provided by husband over the phone.  Seventieths ago, husband started to have tearing in the eyes and runny nose, 3 days later patient started to have similar symptoms of eye discharges and runny nose, soon the clear drainage from the eye turned to cloudy and crusty, and patient started to have productive cough with thin phlegm, husband also reported similar symptoms himself.  No fever as per husband and overnight patient cough became more severe and husband decided to bring her to the hospital.  ED Course: Borderline hypoxic 88% on room air stabilized on 2 L, temperature 99.8, none tachycardia none tachypneic.  Chest x-ray negative for acute infiltrates.  Blood work showed WBC 7.9, hemoglobin 13, creatinine 0.7, K3.8, bicarb 24, glucose 156.  Troponin negative x 2.  Patient was started on ceftriaxone and azithromycin in the ED.  Review of Systems: Unable to perform, patient is baseline confused.  Past Medical History:  Diagnosis Date   Actinic keratosis    Allergy    Anxiety    Atypical mole 04/14/2021   ATYPICAL JUNCTIONAL LENTIGINOUS MELANOCYTIC PROLIFERATION, R lower elbow, exc 07/05/2021   Cancer (HCC)    SKIN   CKD (chronic kidney disease) stage 2, GFR 60-89 ml/min    Depression    Edema    LEGS/FEET   GERD (gastroesophageal reflux disease)    Heart  murmur    Hx of basal cell carcinoma 10/20/2009   R lateral infraorbital   Hx of basal cell carcinoma 06/30/2015   R infra orbital scar   Hyperlipidemia    Hypertension    Hypopotassemia    Microalbuminuria    Osteoarthritis    feet, legs   Osteopenia    [facility] spotted fever) 05/10/2016   Urinary incontinence    Vitamin D deficiency disease    Wears dentures    partial lower   Wheezing     Past Surgical History:  Procedure Laterality Date   BLADDER SURGERY     x 2   BUNIONECTOMY     CATARACT EXTRACTION W/PHACO Right 11/23/2015   Procedure: CATARACT EXTRACTION PHACO AND INTRAOCULAR LENS PLACEMENT (IOC);  Surgeon: Sallee Lange, MD;  Location: [facility] ORS;  Service: Ophthalmology;  Laterality: Right;  [place]    1:30.9 AP%  26.9 CDE   42.29 flud casette lot # [id number]  exp05/31/2018   CATARACT EXTRACTION W/PHACO Left 05/12/2020   Procedure: CATARACT EXTRACTION PHACO AND INTRAOCULAR LENS PLACEMENT (IOC) LEFT 15.24 01:28.8;  Surgeon: Galen Manila, MD;  Location: [facility];  Service: Ophthalmology;  Laterality: Left;   COLONOSCOPY     TOTAL ABDOMINAL HYSTERECTOMY  08/22/2002   Total (due to bladder surgery)     reports that she quit smoking about 34 years ago. Her smoking use included cigarettes. She has never used smokeless tobacco. She reports that she does not currently use alcohol. She reports that she does not  use drugs.  Allergies  Allergen Reactions   Codeine Itching   Lisinopril Cough    Family History  Problem Relation Age of Onset   AAA (abdominal aortic aneurysm) Mother    Arthritis Brother    Heart disease Brother    Stroke Daughter    Diabetes Son    Stroke Son    Seizures Son    Diabetes Brother    Heart disease Brother        7 total heart attacks   Hyperlipidemia Brother    Hypertension Brother    Dementia Brother    AAA (abdominal aortic aneurysm) Brother    Stroke Son    Heart disease Brother    Dementia Sister       Prior to Admission medications   Medication Sig Start Date End Date Taking? Authorizing Provider  acetaminophen (TYLENOL) 500 MG tablet Take 500 mg by mouth every 6 (six) hours as needed.    [provider]  amLODipine (NORVASC) 2.5 MG tablet Take 1 tablet (2.5 mg total) by mouth daily. 05/02/23   Johnson, Megan P, DO  aspirin EC 81 MG tablet Take 81 mg by mouth daily.    [provider]  atorvastatin (LIPITOR) 40 MG tablet Take 1 tablet (40 mg total) by mouth daily. 05/02/23   Johnson, Megan P, DO  buPROPion (WELLBUTRIN XL) 150 MG 24 hr tablet Take 1 tablet (150 mg total) by mouth daily. 06/13/23   Johnson, Megan P, DO  Calcium Carb-Cholecalciferol (CALCIUM 600 + D PO) Take by mouth daily.    [provider]  donepezil (ARICEPT) 10 MG tablet Take 10 mg by mouth at bedtime. 05/21/20   [provider]  DULoxetine (CYMBALTA) 60 MG capsule Take 1 capsule (60 mg total) by mouth daily. 05/02/23   Johnson, Megan P, DO  fluticasone (FLONASE) 50 MCG/ACT nasal spray Place 2 sprays into both nostrils daily. 05/02/23   Olevia Perches P, DO  gabapentin (NEURONTIN) 100 MG capsule Take 100 mg in the morning and 100 mg in the afternoon and 300 mg at night 11/09/22   [provider]  loratadine (CLARITIN) 10 MG tablet Take 1 tablet (10 mg total) by mouth daily. 05/02/23   Johnson, Megan P, DO  losartan (COZAAR) 50 MG tablet Take 1 tablet (50 mg total) by mouth daily. 05/02/23   Johnson, Megan P, DO  memantine (NAMENDA) 5 MG tablet Take 5 mg by mouth daily.    [provider]  mirtazapine (REMERON) 15 MG tablet Take 1 tablet by mouth at bedtime. 10/11/22 10/11/23  [provider]  omeprazole (PRILOSEC) 20 MG capsule Take 1 capsule (20 mg total) by mouth daily. 05/02/23   Johnson, Megan P, DO  QUEtiapine (SEROQUEL) 25 MG tablet Take 1 tablet (25 mg total) by mouth at bedtime. 05/02/23   Johnson, Megan P, DO  traMADol (ULTRAM) 50 MG tablet Take 1 tablet (50  mg total) by mouth 2 (two) times daily as needed. 05/02/23   Johnson, Megan P, DO  traZODone (DESYREL) 50 MG tablet Take 25 mg by mouth at bedtime as needed. 03/17/21   [provider]    Physical Exam: Vitals:   06/22/23 0158 06/22/23 0529 06/22/23 0534 06/22/23 0755  BP: 123/82 128/77  126/70  Pulse: 92 96  96  Resp:  18  18  Temp: 98.9 F (37.2 C) 99.8 F (37.7 C)    TempSrc: Oral Oral    SpO2: 97% (!) 88% 95% 97%  Weight:  Height:        Constitutional: NAD, calm, comfortable Vitals:   06/22/23 0158 06/22/23 0529 06/22/23 0534 06/22/23 0755  BP: 123/82 128/77  126/70  Pulse: 92 96  96  Resp:  18  18  Temp: 98.9 F (37.2 C) 99.8 F (37.7 C)    TempSrc: Oral Oral    SpO2: 97% (!) 88% 95% 97%  Weight:      Height:       Eyes: PERRL, conjunctiva injections with light yellowish drainage and crusty on the side ENMT: Mucous membranes are moist. Posterior pharynx clear of any exudate or lesions.Normal dentition.  Neck: normal, supple, no masses, no thyromegaly Respiratory: clear to auscultation bilaterally, no wheezing, no crackles. Normal respiratory effort. No accessory muscle use.  Cardiovascular: Regular rate and rhythm, no murmurs / rubs / gallops. No extremity edema. 2+ pedal pulses. No carotid bruits.  Abdomen: no tenderness, no masses palpated. No hepatosplenomegaly. Bowel sounds positive.  Musculoskeletal: no clubbing / cyanosis. No joint deformity upper and lower extremities. Good ROM, no contractures. Normal muscle tone.  Skin: no rashes, lesions, ulcers. No induration Neurologic: CN 2-12 grossly intact. Sensation intact, DTR normal. Strength 5/5 in all 4.  Psychiatric: Awake, confused    Labs on Admission: I have personally reviewed following labs and imaging studies  CBC: Recent Labs  Lab 06/22/23 0158  WBC 7.9  NEUTROABS 4.3  HGB 13.0  HCT 40.3  MCV 99.8  PLT 193   Basic Metabolic Panel: Recent Labs  Lab 06/22/23 0158  NA 135  K 3.8   CL 103  CO2 24  GLUCOSE 156*  BUN 17  CREATININE 0.74  CALCIUM 8.4*   GFR: Estimated Creatinine Clearance: 55.4 mL/min (by C-G formula based on SCr of 0.74 mg/dL). Liver Function Tests: Recent Labs  Lab 06/22/23 0158  AST 41  ALT 28  ALKPHOS 65  BILITOT 0.5  PROT 7.2  ALBUMIN 3.4*   No results for input(s): "LIPASE", "AMYLASE" in the last 168 hours. No results for input(s): "AMMONIA" in the last 168 hours. Coagulation Profile: No results for input(s): "INR", "PROTIME" in the last 168 hours. Cardiac Enzymes: No results for input(s): "CKTOTAL", "CKMB", "CKMBINDEX", "TROPONINI" in the last 168 hours. BNP (last 3 results) No results for input(s): "PROBNP" in the last 8760 hours. HbA1C: No results for input(s): "HGBA1C" in the last 72 hours. CBG: No results for input(s): "GLUCAP" in the last 168 hours. Lipid Profile: No results for input(s): "CHOL", "HDL", "LDLCALC", "TRIG", "CHOLHDL", "LDLDIRECT" in the last 72 hours. Thyroid Function Tests: No results for input(s): "TSH", "T4TOTAL", "FREET4", "T3FREE", "THYROIDAB" in the last 72 hours. Anemia Panel: No results for input(s): "VITAMINB12", "FOLATE", "FERRITIN", "TIBC", "IRON", "RETICCTPCT" in the last 72 hours. Urine analysis:    Component Value Date/Time   COLORURINE Straw 06/08/2014 1528   APPEARANCEUR Cloudy (A) 10/01/2021 1504   LABSPEC 1.005 06/08/2014 1528   PHURINE 7.0 06/08/2014 1528   GLUCOSEU Negative 10/01/2021 1504   GLUCOSEU Negative 06/08/2014 1528   HGBUR Negative 06/08/2014 1528   BILIRUBINUR Negative 10/01/2021 1504   BILIRUBINUR Negative 06/08/2014 1528   KETONESUR Negative 06/08/2014 1528   PROTEINUR Negative 10/01/2021 1504   PROTEINUR Negative 06/08/2014 1528   NITRITE Negative 10/01/2021 1504   NITRITE Negative 06/08/2014 1528   LEUKOCYTESUR Negative 10/01/2021 1504   LEUKOCYTESUR Negative 06/08/2014 1528    Radiological Exams on Admission: CT Maxillofacial Wo Contrast  Result Date:  06/22/2023 CLINICAL DATA:  Blunt trauma due to fall on face  EXAM: CT HEAD WITHOUT CONTRAST CT MAXILLOFACIAL WITHOUT CONTRAST CT CERVICAL SPINE WITHOUT CONTRAST TECHNIQUE: Multidetector CT imaging of the head, cervical spine, and maxillofacial structures were performed using the standard protocol without intravenous contrast. Multiplanar CT image reconstructions of the cervical spine and maxillofacial structures were also generated. RADIATION DOSE REDUCTION: This exam was performed according to the departmental dose-optimization program which includes automated exposure control, adjustment of the mA and/or kV according to patient size and/or use of iterative reconstruction technique. COMPARISON:  Head CT 12/18/2017 FINDINGS: CT HEAD FINDINGS Brain: No evidence of acute infarction, hemorrhage, hydrocephalus, extra-axial collection or mass lesion/mass effect. Brain atrophy most notable in the frontal lobes. Ischemic gliosis in the cerebral white matter. Vascular: No hyperdense vessel when comparing side to side on sagittal reformats Skull: No acute fracture CT MAXILLOFACIAL FINDINGS Osseous: No fracture or mandibular dislocation. No destructive process. Orbits: No visible injury Sinuses: Generalized mucosal thickening in the paranasal sinuses. There is a primarily low-density right maxillary sinus fluid level, inflammatory appearing. Soft tissues: No hematoma or foreign body seen. CT CERVICAL SPINE FINDINGS Alignment: No traumatic malalignment Skull base and vertebrae: No acute fracture. No primary bone lesion or focal pathologic process. Generalized osteopenia Soft tissues and spinal canal: No prevertebral fluid or swelling. No visible canal hematoma. Disc levels: Generalized degenerative endplate and facet spurring. Facet ankylosis at C3-4. Upper chest: No visible injury IMPRESSION: 1. No evidence of acute intracranial or cervical spine injury. Negative for facial fracture. 2. Active sinus inflammation including  right maxillary fluid level. Electronically Signed   By: Tiburcio Pea M.D.   On: 06/22/2023 04:11   CT Head Wo Contrast  Result Date: 06/22/2023 CLINICAL DATA:  Blunt trauma due to fall on face EXAM: CT HEAD WITHOUT CONTRAST CT MAXILLOFACIAL WITHOUT CONTRAST CT CERVICAL SPINE WITHOUT CONTRAST TECHNIQUE: Multidetector CT imaging of the head, cervical spine, and maxillofacial structures were performed using the standard protocol without intravenous contrast. Multiplanar CT image reconstructions of the cervical spine and maxillofacial structures were also generated. RADIATION DOSE REDUCTION: This exam was performed according to the departmental dose-optimization program which includes automated exposure control, adjustment of the mA and/or kV according to patient size and/or use of iterative reconstruction technique. COMPARISON:  Head CT 12/18/2017 FINDINGS: CT HEAD FINDINGS Brain: No evidence of acute infarction, hemorrhage, hydrocephalus, extra-axial collection or mass lesion/mass effect. Brain atrophy most notable in the frontal lobes. Ischemic gliosis in the cerebral white matter. Vascular: No hyperdense vessel when comparing side to side on sagittal reformats Skull: No acute fracture CT MAXILLOFACIAL FINDINGS Osseous: No fracture or mandibular dislocation. No destructive process. Orbits: No visible injury Sinuses: Generalized mucosal thickening in the paranasal sinuses. There is a primarily low-density right maxillary sinus fluid level, inflammatory appearing. Soft tissues: No hematoma or foreign body seen. CT CERVICAL SPINE FINDINGS Alignment: No traumatic malalignment Skull base and vertebrae: No acute fracture. No primary bone lesion or focal pathologic process. Generalized osteopenia Soft tissues and spinal canal: No prevertebral fluid or swelling. No visible canal hematoma. Disc levels: Generalized degenerative endplate and facet spurring. Facet ankylosis at C3-4. Upper chest: No visible injury  IMPRESSION: 1. No evidence of acute intracranial or cervical spine injury. Negative for facial fracture. 2. Active sinus inflammation including right maxillary fluid level. Electronically Signed   By: Tiburcio Pea M.D.   On: 06/22/2023 04:11   CT Cervical Spine Wo Contrast  Result Date: 06/22/2023 CLINICAL DATA:  Blunt trauma due to fall on face EXAM: CT HEAD WITHOUT CONTRAST CT MAXILLOFACIAL  WITHOUT CONTRAST CT CERVICAL SPINE WITHOUT CONTRAST TECHNIQUE: Multidetector CT imaging of the head, cervical spine, and maxillofacial structures were performed using the standard protocol without intravenous contrast. Multiplanar CT image reconstructions of the cervical spine and maxillofacial structures were also generated. RADIATION DOSE REDUCTION: This exam was performed according to the departmental dose-optimization program which includes automated exposure control, adjustment of the mA and/or kV according to patient size and/or use of iterative reconstruction technique. COMPARISON:  Head CT 12/18/2017 FINDINGS: CT HEAD FINDINGS Brain: No evidence of acute infarction, hemorrhage, hydrocephalus, extra-axial collection or mass lesion/mass effect. Brain atrophy most notable in the frontal lobes. Ischemic gliosis in the cerebral white matter. Vascular: No hyperdense vessel when comparing side to side on sagittal reformats Skull: No acute fracture CT MAXILLOFACIAL FINDINGS Osseous: No fracture or mandibular dislocation. No destructive process. Orbits: No visible injury Sinuses: Generalized mucosal thickening in the paranasal sinuses. There is a primarily low-density right maxillary sinus fluid level, inflammatory appearing. Soft tissues: No hematoma or foreign body seen. CT CERVICAL SPINE FINDINGS Alignment: No traumatic malalignment Skull base and vertebrae: No acute fracture. No primary bone lesion or focal pathologic process. Generalized osteopenia Soft tissues and spinal canal: No prevertebral fluid or swelling. No  visible canal hematoma. Disc levels: Generalized degenerative endplate and facet spurring. Facet ankylosis at C3-4. Upper chest: No visible injury IMPRESSION: 1. No evidence of acute intracranial or cervical spine injury. Negative for facial fracture. 2. Active sinus inflammation including right maxillary fluid level. Electronically Signed   By: Tiburcio Pea M.D.   On: 06/22/2023 04:11   DG Chest 1 View  Result Date: 06/22/2023 CLINICAL DATA:  Cough EXAM: CHEST  1 VIEW COMPARISON:  Chest x-ray 12/18/2017 FINDINGS: The aorta is ectatic, unchanged. Heart size is within normal limits. There is no focal lung infiltrate, pleural effusion or pneumothorax. No acute fractures are seen. IMPRESSION: No active disease. Electronically Signed   By: Darliss Cheney M.D.   On: 06/22/2023 01:41    EKG: Independently reviewed.  Sinus rhythm, no acute ST changes.  Assessment/Plan Principal Problem:   PNA (pneumonia) Active Problems:   Acute respiratory failure with hypoxia (HCC)   CAP (community acquired pneumonia)  (please populate well all problems here in Problem List. (For example, if patient is on BP meds at home and you resume or decide to hold them, it is a problem that needs to be her. Same for CAD, COPD, HLD and so on)  Acute hypoxic respiratory failure -Secondary to CAP, bacterial -Likely related to increased postnasal drip -Continue ceftriaxone and azithromycin -Sputum culture, atypical study and strep antigens -Other supportive care including incentive spirometry and flutter valve.  DuoNebs as needed  Acute conjunctivitis -Probably viral initially and superimposed with bacterial infection, trial of antibiotic eyedrops -Check respiratory panel  Advanced dementia -Mentation at baseline. -Continue memantine and Aricept  HTN -Stable, continue amlodipine  Anxiety/depression -On multiple SSRIs   DVT prophylaxis: Lovenox Code Status: DNR Family Communication: Husband over the  phone Disposition Plan: Expect less than 2 midnight hospital stay Consults called: None Admission status: Telemetry observation   Emeline General MD [facility] Pager [phone]  06/22/2023, 12:11 PM

## 2023-06-23 DIAGNOSIS — F32A Depression, unspecified: Secondary | ICD-10-CM | POA: Diagnosis present

## 2023-06-23 DIAGNOSIS — Z823 Family history of stroke: Secondary | ICD-10-CM | POA: Diagnosis not present

## 2023-06-23 DIAGNOSIS — Z79899 Other long term (current) drug therapy: Secondary | ICD-10-CM | POA: Diagnosis not present

## 2023-06-23 DIAGNOSIS — W19XXXA Unspecified fall, initial encounter: Secondary | ICD-10-CM

## 2023-06-23 DIAGNOSIS — N182 Chronic kidney disease, stage 2 (mild): Secondary | ICD-10-CM | POA: Diagnosis present

## 2023-06-23 DIAGNOSIS — J9601 Acute respiratory failure with hypoxia: Secondary | ICD-10-CM

## 2023-06-23 DIAGNOSIS — I2510 Atherosclerotic heart disease of native coronary artery without angina pectoris: Secondary | ICD-10-CM | POA: Diagnosis present

## 2023-06-23 DIAGNOSIS — Z6831 Body mass index (BMI) 31.0-31.9, adult: Secondary | ICD-10-CM | POA: Diagnosis not present

## 2023-06-23 DIAGNOSIS — E785 Hyperlipidemia, unspecified: Secondary | ICD-10-CM | POA: Diagnosis present

## 2023-06-23 DIAGNOSIS — Z888 Allergy status to other drugs, medicaments and biological substances status: Secondary | ICD-10-CM | POA: Diagnosis not present

## 2023-06-23 DIAGNOSIS — H1033 Unspecified acute conjunctivitis, bilateral: Secondary | ICD-10-CM | POA: Diagnosis present

## 2023-06-23 DIAGNOSIS — Z85828 Personal history of other malignant neoplasm of skin: Secondary | ICD-10-CM | POA: Diagnosis not present

## 2023-06-23 DIAGNOSIS — Z66 Do not resuscitate: Secondary | ICD-10-CM | POA: Diagnosis present

## 2023-06-23 DIAGNOSIS — Y92009 Unspecified place in unspecified non-institutional (private) residence as the place of occurrence of the external cause: Secondary | ICD-10-CM

## 2023-06-23 DIAGNOSIS — E669 Obesity, unspecified: Secondary | ICD-10-CM | POA: Diagnosis present

## 2023-06-23 DIAGNOSIS — J209 Acute bronchitis, unspecified: Secondary | ICD-10-CM | POA: Diagnosis present

## 2023-06-23 DIAGNOSIS — F0394 Unspecified dementia, unspecified severity, with anxiety: Secondary | ICD-10-CM | POA: Diagnosis present

## 2023-06-23 DIAGNOSIS — J0100 Acute maxillary sinusitis, unspecified: Secondary | ICD-10-CM | POA: Diagnosis present

## 2023-06-23 DIAGNOSIS — I129 Hypertensive chronic kidney disease with stage 1 through stage 4 chronic kidney disease, or unspecified chronic kidney disease: Secondary | ICD-10-CM | POA: Diagnosis present

## 2023-06-23 DIAGNOSIS — Z7982 Long term (current) use of aspirin: Secondary | ICD-10-CM | POA: Diagnosis not present

## 2023-06-23 DIAGNOSIS — F0393 Unspecified dementia, unspecified severity, with mood disturbance: Secondary | ICD-10-CM | POA: Diagnosis present

## 2023-06-23 DIAGNOSIS — Z8249 Family history of ischemic heart disease and other diseases of the circulatory system: Secondary | ICD-10-CM | POA: Diagnosis not present

## 2023-06-23 DIAGNOSIS — J189 Pneumonia, unspecified organism: Secondary | ICD-10-CM | POA: Diagnosis present

## 2023-06-23 DIAGNOSIS — F0390 Unspecified dementia without behavioral disturbance: Secondary | ICD-10-CM

## 2023-06-23 DIAGNOSIS — Z1152 Encounter for screening for COVID-19: Secondary | ICD-10-CM | POA: Diagnosis not present

## 2023-06-23 DIAGNOSIS — S0083XA Contusion of other part of head, initial encounter: Secondary | ICD-10-CM | POA: Diagnosis present

## 2023-06-23 DIAGNOSIS — Z87891 Personal history of nicotine dependence: Secondary | ICD-10-CM | POA: Diagnosis not present

## 2023-06-23 DIAGNOSIS — Z885 Allergy status to narcotic agent status: Secondary | ICD-10-CM | POA: Diagnosis not present

## 2023-06-23 MED ORDER — AMOXICILLIN-POT CLAVULANATE 875-125 MG PO TABS
1.0000 | ORAL_TABLET | Freq: Two times a day (BID) | ORAL | Status: DC
  Administered 2023-06-24 – 2023-06-25 (×3): 1 via ORAL
  Filled 2023-06-23 (×3): qty 1

## 2023-06-23 MED ORDER — ACETAMINOPHEN 500 MG PO TABS
500.0000 mg | ORAL_TABLET | Freq: Four times a day (QID) | ORAL | Status: DC | PRN
  Administered 2023-06-25: 500 mg via ORAL
  Filled 2023-06-23: qty 1

## 2023-06-23 NOTE — Plan of Care (Signed)
Pt alert and oriented, and forgetful history of dementia. Pt has noted eye drainage.Pt respiratory panel negative no isolation required. Pt on 2L/[place] provider wants pt weaned off oxygen. Bruising noted to skin from fall at home. Pt has scar to rt side of face fro hx of skin cancer. Problem: Activity: Goal: Ability to tolerate increased activity will improve Outcome: Not Progressing   Problem: Clinical Measurements: Goal: Ability to maintain a body temperature in the normal range will improve Outcome: Not Progressing   Problem: Respiratory: Goal: Ability to maintain adequate ventilation will improve Outcome: Not Progressing Goal: Ability to maintain a clear airway will improve Outcome: Not Progressing   Problem: Education: Goal: Knowledge of General Education information will improve Description: Including pain rating scale, medication(s)/side effects and non-pharmacologic comfort measures Outcome: Not Progressing   Problem: Health Behavior/Discharge Planning: Goal: Ability to manage health-related needs will improve Outcome: Not Progressing   Problem: Clinical Measurements: Goal: Ability to maintain clinical measurements within normal limits will improve Outcome: Not Progressing Goal: Will remain free from infection Outcome: Not Progressing Goal: Diagnostic test results will improve Outcome: Not Progressing Goal: Respiratory complications will improve Outcome: Not Progressing Goal: Cardiovascular complication will be avoided Outcome: Not Progressing   Problem: Activity: Goal: Risk for activity intolerance will decrease Outcome: Not Progressing   Problem: Nutrition: Goal: Adequate nutrition will be maintained Outcome: Not Progressing   Problem: Coping: Goal: Level of anxiety will decrease Outcome: Not Progressing   Problem: Elimination: Goal: Will not experience complications related to bowel motility Outcome: Not Progressing Goal: Will not experience complications  related to urinary retention Outcome: Not Progressing   Problem: Pain Management: Goal: General experience of comfort will improve Outcome: Not Progressing   Problem: Safety: Goal: Ability to remain free from injury will improve Outcome: Not Progressing   Problem: Skin Integrity: Goal: Risk for impaired skin integrity will decrease Outcome: Not Progressing

## 2023-06-23 NOTE — Progress Notes (Signed)
[facility]  - [place] [facility]   PATIENT NAME: Cynthia Vaughan    MR#:  [id number]  DATE OF BIRTH:  [birth year]  SUBJECTIVE:  sitting up in the bed. No family at bedside. Spoke with husband on the phone. Patient has advanced dementia. She was able to tell me she is at the hospital her name and her husband's name. Not in respiratory distress. Limited history.  VITALS:  Blood pressure 137/85, pulse 94, temperature 99.4 F (37.4 C), resp. rate 18, height 5\' 2"  (1.575 m), weight 79 kg, SpO2 100%.  PHYSICAL EXAMINATION:   GENERAL:  82 y.o.-year-old patient with no acute distress. Obese, LUNGS: Normal breath sounds bilaterally, no wheezing CARDIOVASCULAR: S1, S2 normal. No murmur   ABDOMEN: Soft, nontender, nondistended. EXTREMITIES: No  edema b/l.    NEUROLOGIC: nonfocal  patient is alert and awake, at baseline has dementia SKIN:bruise over the right cheek  LABORATORY PANEL:  CBC Recent Labs  Lab 06/22/23 0158  WBC 7.9  HGB 13.0  HCT 40.3  PLT 193    Chemistries  Recent Labs  Lab 06/22/23 0158  NA 135  K 3.8  CL 103  CO2 24  GLUCOSE 156*  BUN 17  CREATININE 0.74  CALCIUM 8.4*  AST 41  ALT 28  ALKPHOS 65  BILITOT 0.5   Cardiac Enzymes No results for input(s): "TROPONINI" in the last 168 hours. RADIOLOGY:  CT Maxillofacial Wo Contrast  Result Date: 06/22/2023 CLINICAL DATA:  Blunt trauma due to fall on face EXAM: CT HEAD WITHOUT CONTRAST CT MAXILLOFACIAL WITHOUT CONTRAST CT CERVICAL SPINE WITHOUT CONTRAST TECHNIQUE: Multidetector CT imaging of the head, cervical spine, and maxillofacial structures were performed using the standard protocol without intravenous contrast. Multiplanar CT image reconstructions of the cervical spine and maxillofacial structures were also generated. RADIATION DOSE REDUCTION: This exam was performed according to the departmental dose-optimization program which includes automated exposure control, adjustment of the mA  and/or kV according to patient size and/or use of iterative reconstruction technique. COMPARISON:  Head CT 12/18/2017 FINDINGS: CT HEAD FINDINGS Brain: No evidence of acute infarction, hemorrhage, hydrocephalus, extra-axial collection or mass lesion/mass effect. Brain atrophy most notable in the frontal lobes. Ischemic gliosis in the cerebral white matter. Vascular: No hyperdense vessel when comparing side to side on sagittal reformats Skull: No acute fracture CT MAXILLOFACIAL FINDINGS Osseous: No fracture or mandibular dislocation. No destructive process. Orbits: No visible injury Sinuses: Generalized mucosal thickening in the paranasal sinuses. There is a primarily low-density right maxillary sinus fluid level, inflammatory appearing. Soft tissues: No hematoma or foreign body seen. CT CERVICAL SPINE FINDINGS Alignment: No traumatic malalignment Skull base and vertebrae: No acute fracture. No primary bone lesion or focal pathologic process. Generalized osteopenia Soft tissues and spinal canal: No prevertebral fluid or swelling. No visible canal hematoma. Disc levels: Generalized degenerative endplate and facet spurring. Facet ankylosis at C3-4. Upper chest: No visible injury IMPRESSION: 1. No evidence of acute intracranial or cervical spine injury. Negative for facial fracture. 2. Active sinus inflammation including right maxillary fluid level. Electronically Signed   By: Tiburcio Pea M.D.   On: 06/22/2023 04:11   CT Head Wo Contrast  Result Date: 06/22/2023 CLINICAL DATA:  Blunt trauma due to fall on face EXAM: CT HEAD WITHOUT CONTRAST CT MAXILLOFACIAL WITHOUT CONTRAST CT CERVICAL SPINE WITHOUT CONTRAST TECHNIQUE: Multidetector CT imaging of the head, cervical spine, and maxillofacial structures were performed using the standard protocol without intravenous contrast. Multiplanar CT image reconstructions of the cervical spine  and maxillofacial structures were also generated. RADIATION DOSE REDUCTION: This  exam was performed according to the departmental dose-optimization program which includes automated exposure control, adjustment of the mA and/or kV according to patient size and/or use of iterative reconstruction technique. COMPARISON:  Head CT 12/18/2017 FINDINGS: CT HEAD FINDINGS Brain: No evidence of acute infarction, hemorrhage, hydrocephalus, extra-axial collection or mass lesion/mass effect. Brain atrophy most notable in the frontal lobes. Ischemic gliosis in the cerebral white matter. Vascular: No hyperdense vessel when comparing side to side on sagittal reformats Skull: No acute fracture CT MAXILLOFACIAL FINDINGS Osseous: No fracture or mandibular dislocation. No destructive process. Orbits: No visible injury Sinuses: Generalized mucosal thickening in the paranasal sinuses. There is a primarily low-density right maxillary sinus fluid level, inflammatory appearing. Soft tissues: No hematoma or foreign body seen. CT CERVICAL SPINE FINDINGS Alignment: No traumatic malalignment Skull base and vertebrae: No acute fracture. No primary bone lesion or focal pathologic process. Generalized osteopenia Soft tissues and spinal canal: No prevertebral fluid or swelling. No visible canal hematoma. Disc levels: Generalized degenerative endplate and facet spurring. Facet ankylosis at C3-4. Upper chest: No visible injury IMPRESSION: 1. No evidence of acute intracranial or cervical spine injury. Negative for facial fracture. 2. Active sinus inflammation including right maxillary fluid level. Electronically Signed   By: Tiburcio Pea M.D.   On: 06/22/2023 04:11   CT Cervical Spine Wo Contrast  Result Date: 06/22/2023 CLINICAL DATA:  Blunt trauma due to fall on face EXAM: CT HEAD WITHOUT CONTRAST CT MAXILLOFACIAL WITHOUT CONTRAST CT CERVICAL SPINE WITHOUT CONTRAST TECHNIQUE: Multidetector CT imaging of the head, cervical spine, and maxillofacial structures were performed using the standard protocol without intravenous  contrast. Multiplanar CT image reconstructions of the cervical spine and maxillofacial structures were also generated. RADIATION DOSE REDUCTION: This exam was performed according to the departmental dose-optimization program which includes automated exposure control, adjustment of the mA and/or kV according to patient size and/or use of iterative reconstruction technique. COMPARISON:  Head CT 12/18/2017 FINDINGS: CT HEAD FINDINGS Brain: No evidence of acute infarction, hemorrhage, hydrocephalus, extra-axial collection or mass lesion/mass effect. Brain atrophy most notable in the frontal lobes. Ischemic gliosis in the cerebral white matter. Vascular: No hyperdense vessel when comparing side to side on sagittal reformats Skull: No acute fracture CT MAXILLOFACIAL FINDINGS Osseous: No fracture or mandibular dislocation. No destructive process. Orbits: No visible injury Sinuses: Generalized mucosal thickening in the paranasal sinuses. There is a primarily low-density right maxillary sinus fluid level, inflammatory appearing. Soft tissues: No hematoma or foreign body seen. CT CERVICAL SPINE FINDINGS Alignment: No traumatic malalignment Skull base and vertebrae: No acute fracture. No primary bone lesion or focal pathologic process. Generalized osteopenia Soft tissues and spinal canal: No prevertebral fluid or swelling. No visible canal hematoma. Disc levels: Generalized degenerative endplate and facet spurring. Facet ankylosis at C3-4. Upper chest: No visible injury IMPRESSION: 1. No evidence of acute intracranial or cervical spine injury. Negative for facial fracture. 2. Active sinus inflammation including right maxillary fluid level. Electronically Signed   By: Tiburcio Pea M.D.   On: 06/22/2023 04:11   DG Chest 1 View  Result Date: 06/22/2023 CLINICAL DATA:  Cough EXAM: CHEST  1 VIEW COMPARISON:  Chest x-ray 12/18/2017 FINDINGS: The aorta is ectatic, unchanged. Heart size is within normal limits. There is no  focal lung infiltrate, pleural effusion or pneumothorax. No acute fractures are seen. IMPRESSION: No active disease. Electronically Signed   By: Darliss Cheney M.D.   On: 06/22/2023 01:41  Assessment and Plan  SHANTIL VALLEJO is a 82 y.o. female with medical history significant of advanced dementia, HTN, HLD, anxiety/depression, brought in by family member for evaluation of worsening of productive cough.   Patient has advanced dementia unable to provide any history, all history provided by husband over the phone.  Seventieths ago, husband started to have tearing in the eyes and runny nose, 3 days later patient started to have similar symptoms of eye discharges and runny nose, soon the clear drainage from the eye turned to cloudy and crusty, and patient started to have productive cough with thin phlegm, husband also reported similar symptoms himself.    Acute hypoxic respiratory failure acute right maxillary sinusitis acute bronchitis -Likely related to increased postnasal drip -Augmentin 875 BID for seven days -Sputum culture, atypical study and strep antigens -Other supportive care including incentive spirometry and flutter valve.  DuoNebs as needed -- afebrile. Wean oxygen down to room air. -- No evidence of pneumonia and chest x-ray.   Acute conjunctivitis -Probably viral initially and superimposed with bacterial infection, trial of antibiotic eyedrops - respiratory panel-- negative   Advanced dementia -Mentation at baseline. -Continue memantine and Aricept   HTN -Stable, continue amlodipine   Anxiety/depression -continue home meds   fall with right cheek bruise -- PT OT to see patient. -- Per husband patient does not like to use walker. At home she takes support of walls and furniture to get around.      DVT prophylaxis: Lovenox Code Status: DNR Family Communication: Husband over the phone Level of care: Telemetry Medical Status is: Inpatient Remains inpatient  appropriate because: monitor for another day. PT OT to work with patient.    TOTAL TIME TAKING CARE OF THIS PATIENT: 35 minutes.  >50% time spent on counselling and coordination of care  Note: This dictation was prepared with Dragon dictation along with smaller phrase technology. Any transcriptional errors that result from this process are unintentional.  Enedina Finner M.D    [facility]   CC: Primary care physician; Dorcas Carrow, DO

## 2023-06-23 NOTE — Evaluation (Signed)
Occupational Therapy Evaluation Patient Details Name: Cynthia Vaughan MRN: [id number] DOB: Jan 16, 1941 Today's Date: 06/23/2023   History of Present Illness Cynthia Vaughan is a 82 y.o. female with medical history significant of advanced dementia, HTN, HLD, anxiety/depression, brought in by family member for evaluation of worsening of productive cough.   Clinical Impression   Pt was seen for OT evaluation this date. Prior to hospital admission, pt reports that she is independent with all ADL's. Pt lives with husband who helps her with all IADL's such as cooking, cleaning, and laundry. Pt presents to acute OT demonstrating impaired ADL performance and functional mobility 2/2 (See OT problem list for additional functional deficits). Upon arrival to room pt seated in chair, agreeable to tx. Pt removed N.C. and checked O2, O2 was 93-94 on room air and placed O2 back on. Pt completed sit<>stand t/f with CGA+RW. Pt completed a stand pivot from chair to bed with CGA +RW. Pt completed a sit<>supine with Min G. Pt completed MMT and demonstrated AROM of BUE in all planes of movement and hand opposition with no difficulty noted. Noticed resting hand tremor while supine in bed. Pt left supine in bed with all needs met. Pt would benefit from skilled OT services to address noted impairments and functional limitations (see below for any additional details) in order to maximize safety and independence while minimizing falls risk and caregiver burden. Anticipate the need for follow up OT services upon acute hospital DC.        If plan is discharge home, recommend the following: Assistance with cooking/housework;Supervision due to cognitive status;Assist for transportation;Direct supervision/assist for medications management;Direct supervision/assist for financial management;A little help with walking and/or transfers;A lot of help with bathing/dressing/bathroom    Functional Status Assessment  Patient has had a  recent decline in their functional status and demonstrates the ability to make significant improvements in function in a reasonable and predictable amount of time.  Equipment Recommendations  BSC/3in1    Recommendations for Other Services       Precautions / Restrictions Precautions Precautions: Fall Restrictions Weight Bearing Restrictions: No      Mobility Bed Mobility                 Patient Response: Cooperative  Transfers Overall transfer level: Needs assistance Equipment used: Rolling walker (2 wheels) Transfers: Sit to/from Stand Sit to Stand: Contact guard assist                  Balance Overall balance assessment: Needs assistance Sitting-balance support: Bilateral upper extremity supported Sitting balance-Leahy Scale: Good     Standing balance support: Bilateral upper extremity supported, During functional activity, Reliant on assistive device for balance Standing balance-Leahy Scale: Fair                             ADL either performed or assessed with clinical judgement   ADL Overall ADL's : Needs assistance/impaired                         Toilet Transfer: Contact guard assist;Stand-pivot;Rolling walker (2 wheels);BSC/3in1 Toilet Transfer Details (indicate cue type and reason): simulated         Functional mobility during ADLs: Contact guard assist;Rolling walker (2 wheels)       Vision Patient Visual Report: No change from baseline       Perception         Praxis  Pertinent Vitals/Pain Pain Assessment Pain Assessment: No/denies pain     Extremity/Trunk Assessment Upper Extremity Assessment Upper Extremity Assessment: Overall WFL for tasks assessed   Lower Extremity Assessment Lower Extremity Assessment: Defer to PT evaluation   Cervical / Trunk Assessment Cervical / Trunk Assessment: Normal   Communication Communication Communication: No apparent difficulties   Cognition Arousal:  Alert Behavior During Therapy: WFL for tasks assessed/performed Overall Cognitive Status: History of cognitive impairments - at baseline                                       General Comments       Exercises     Shoulder Instructions      Home Living Family/patient expects to be discharged to:: Private residence Living Arrangements: Spouse/significant other Available Help at Discharge: Family Type of Home: House Home Access: Stairs to enter Secretary/administrator of Steps: 5   Home Layout: One level     Bathroom Shower/Tub: Walk-in shower         Home Equipment: Pharmacist, hospital (2 wheels)          Prior Functioning/Environment Prior Level of Function : Needs assist       Physical Assist : ADLs (physical)   ADLs (physical): IADLs   ADLs Comments: Pt reports that her husband helps with IADL's and that she is independent with ADL's and utilizes a RW at baseline.        OT Problem List: Decreased range of motion;Decreased strength;Impaired balance (sitting and/or standing);Decreased activity tolerance      OT Treatment/Interventions: Self-care/ADL training;Therapeutic exercise;DME and/or AE instruction;Therapeutic activities    OT Goals(Current goals can be found in the care plan section) Acute Rehab OT Goals Patient Stated Goal: to go home. OT Goal Formulation: With patient Time For Goal Achievement: 07/07/23 Potential to Achieve Goals: Good  OT Frequency: Min 1X/week    Co-evaluation              AM-PAC OT "6 Clicks" Daily Activity     Outcome Measure Help from another person eating meals?: None Help from another person taking care of personal grooming?: A Little Help from another person toileting, which includes using toliet, bedpan, or urinal?: A Little Help from another person bathing (including washing, rinsing, drying)?: A Lot Help from another person to put on and taking off regular upper body clothing?: A  Little Help from another person to put on and taking off regular lower body clothing?: A Lot 6 Click Score: 17   End of Session Equipment Utilized During Treatment: Rolling walker (2 wheels)  Activity Tolerance: Patient tolerated treatment well Patient left: in bed;with call bell/phone within reach;with bed alarm set  OT Visit Diagnosis: Unsteadiness on feet (R26.81);Other abnormalities of gait and mobility (R26.89);Repeated falls (R29.6)                Time: 6045-4098 OT Time Calculation (min): 12 min Charges:     Butch Penny, SOT

## 2023-06-23 NOTE — Evaluation (Signed)
Physical Therapy Evaluation Patient Details Name: Cynthia Vaughan MRN: [id number] DOB: 1940-12-07 Today's Date: 06/23/2023  History of Present Illness  Pt is an 82 y.o. female presenting to hospital 06/21/23 with c/o productive cough and nasal congestion for past 5 days; also with SOB and generalized weakness; multiple falls during this time d/t generalized weakness.  Pt admitted with acute hypoxic respiratory failure secondary to CAP, acute conjunctivitis.  PMH includes CKD, htn, dementia, anxiety/depression.  Clinical Impression  Prior to recent medical concerns, pt was ambulatory (no AD use but appeared to hold on furniture as needed per pt's husband's description); lives with her husband in 1 level home with 4 plus 1 step to enter.  Pt appearing confused during session (pt's husband able to assist with information).  Currently pt is CGA with transfers; CGA to ambulate 12 feet with RW (in grippy socks); CGA to min assist to ambulate 10 feet with RW (in personal shoes).  Limited activity d/t SOB and fatigue; pt also appearing shaky in general with all activity.  Pt reporting 5/10 LBP at rest but improved with activity.  Pt's SpO2 sats 93% or greater on 2 L O2 via nasal cannula during sessions activities.  Pt's husband verbalizing concerns regarding pt being able to manage steps into home.  Pt would currently benefit from skilled PT to address noted impairments and functional limitations (see below for any additional details).  Upon hospital discharge, pt would benefit from ongoing therapy.     If plan is discharge home, recommend the following: A little help with walking and/or transfers;A little help with bathing/dressing/bathroom;Assistance with cooking/housework;Direct supervision/assist for medications management;Direct supervision/assist for financial management;Assist for transportation;Help with stairs or ramp for entrance   Can travel by private vehicle        Equipment Recommendations  Other (comment) (pt has RW at home already)  Recommendations for Other Services       Functional Status Assessment Patient has had a recent decline in their functional status and demonstrates the ability to make significant improvements in function in a reasonable and predictable amount of time.     Precautions / Restrictions Precautions Precautions: Fall Restrictions Weight Bearing Restrictions: No      Mobility  Bed Mobility               General bed mobility comments: Deferred (pt sitting in recliner beginning/end of session)    Transfers Overall transfer level: Needs assistance Equipment used: Rolling walker (2 wheels) Transfers: Sit to/from Stand Sit to Stand: Contact guard assist           General transfer comment: vc's for UE placement; vc's to line up to surface prior to sitting    Ambulation/Gait Ambulation/Gait assistance: Contact guard assist, Min assist Gait Distance (Feet):  (12 feet (with grippy socks); 10 feet (with pt's own shoes)) Assistive device: Rolling walker (2 wheels) Gait Pattern/deviations: Step-through pattern, Decreased step length - right, Decreased step length - left Gait velocity: decreased     General Gait Details: increased effort to take steps; shaky in general; limited distance ambulating d/t SOB and fatigue  Stairs            Wheelchair Mobility     Tilt Bed    Modified Rankin (Stroke Patients Only)       Balance Overall balance assessment: Needs assistance Sitting-balance support: No upper extremity supported, Feet supported Sitting balance-Leahy Scale: Good Sitting balance - Comments: steady reaching within BOS   Standing balance support: Bilateral upper extremity  supported, During functional activity, Reliant on assistive device for balance Standing balance-Leahy Scale: Fair Standing balance comment: pt appearing shaky in standing with B UE support on RW                              Pertinent Vitals/Pain Pain Assessment Pain Assessment: No/denies pain HR 94-113 bpm during sessions activities.    Home Living Family/patient expects to be discharged to:: Private residence Living Arrangements: Spouse/significant other Available Help at Discharge: Family Type of Home: House Home Access: Stairs to enter   [employer] of Steps: 4 (B railing) plus 1 (no railing)   Home Layout: One level Home Equipment: Agricultural consultant (2 wheels);Rollator (4 wheels);Cane - single point;Wheelchair - manual;Grab bars - tub/shower;Shower seat - built in      Prior Function Prior Level of Function : Needs assist  Cognitive Assist : Mobility (cognitive)     Physical Assist : ADLs (physical)   ADLs (physical): IADLs Mobility Comments: Pt ambulatory no AD use prior to recent medical concerns (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  Pt's husband reports multiple recent falls (pt reports no recent falls).     Extremity/Trunk Assessment   Upper Extremity Assessment Upper Extremity Assessment: Generalized weakness    Lower Extremity Assessment Lower Extremity Assessment: Generalized weakness    Cervical / Trunk Assessment Cervical / Trunk Assessment: Other exceptions Cervical / Trunk Exceptions: forward head/shoulders  Communication   Communication Communication: No apparent difficulties;Difficulty following commands/understanding Following commands: Follows one step commands with increased time;Follows multi-step commands inconsistently Cueing Techniques: Verbal cues;Visual cues  Cognition Arousal: Alert Behavior During Therapy: WFL for tasks assessed/performed Overall Cognitive Status: History of cognitive impairments - at baseline                                 General Comments: Oriented to self and hospital only.  Pt inconsisent  with information during session.        General Comments  Nursing cleared pt for participation in physical therapy.  Pt agreeable to PT session.  Pt's husband present during session.    Exercises     Assessment/Plan    PT Assessment Patient needs continued PT services  PT Problem List Decreased strength;Decreased activity tolerance;Decreased balance;Decreased mobility;Decreased safety awareness;Cardiopulmonary status limiting activity       PT Treatment Interventions DME instruction;Gait training;Stair training;Functional mobility training;Therapeutic activities;Therapeutic exercise;Balance training;Patient/family education    PT Goals (Current goals can be found in the Care Plan section)  Acute Rehab PT Goals Patient Stated Goal: to improve strength and mobility PT Goal Formulation: With patient Time For Goal Achievement: 07/07/23 Potential to Achieve Goals: Good    Frequency Min 1X/week     Co-evaluation               AM-PAC PT "6 Clicks" Mobility  Outcome Measure Help needed turning from your back to your side while in a flat bed without using bedrails?: A Little Help needed moving from lying on your back to sitting on the side of a flat bed without using bedrails?: A Little Help needed moving to and from a bed to a chair (including a wheelchair)?: A Little Help needed standing up from a chair using your arms (e.g., wheelchair or bedside chair)?: A Little Help  needed to walk in hospital room?: A Little Help needed climbing 3-5 steps with a railing? : A Lot 6 Click Score: 17    End of Session Equipment Utilized During Treatment: Gait belt Activity Tolerance: Patient limited by fatigue Patient left: in chair;with call bell/phone within reach;with chair alarm set;with family/visitor present Nurse Communication: Mobility status;Precautions; pain status PT Visit Diagnosis: Unsteadiness on feet (R26.81);Other abnormalities of gait and mobility (R26.89);Muscle  weakness (generalized) (M62.81);History of falling (Z91.81)    Time: 1150-1220 PT Time Calculation (min) (ACUTE ONLY): 30 min   Charges:   PT Evaluation $PT Eval Low Complexity: 1 Low PT Treatments $Therapeutic Activity: 8-22 mins PT General Charges $$ ACUTE PT VISIT: 1 Visit        Hendricks Limes, PT 06/23/23, 4:58 PM

## 2023-06-24 DIAGNOSIS — J9601 Acute respiratory failure with hypoxia: Secondary | ICD-10-CM | POA: Diagnosis not present

## 2023-06-24 DIAGNOSIS — W19XXXA Unspecified fall, initial encounter: Secondary | ICD-10-CM | POA: Diagnosis not present

## 2023-06-24 DIAGNOSIS — J0100 Acute maxillary sinusitis, unspecified: Secondary | ICD-10-CM | POA: Diagnosis not present

## 2023-06-24 DIAGNOSIS — F0390 Unspecified dementia without behavioral disturbance: Secondary | ICD-10-CM | POA: Diagnosis not present

## 2023-06-24 MED ORDER — LOPERAMIDE HCL 2 MG PO CAPS
2.0000 mg | ORAL_CAPSULE | Freq: Four times a day (QID) | ORAL | Status: DC | PRN
  Administered 2023-06-24 – 2023-06-25 (×2): 2 mg via ORAL
  Filled 2023-06-24 (×2): qty 1

## 2023-06-24 NOTE — Plan of Care (Signed)

## 2023-06-24 NOTE — Progress Notes (Addendum)
[facility]  - [place] [facility]   PATIENT NAME: Cynthia Vaughan    MR#:  [id number]  DATE OF BIRTH:  09-Aug-1941  SUBJECTIVE:  sitting up in the chair  No family at bedside. Spoke with husband on the phone. Patient has advanced dementia. Cough+. On RA Worked with PT today VITALS:  Blood pressure (!) 147/81, pulse (!) 104, temperature 98.7 F (37.1 C), temperature source Oral, resp. rate 16, height 5\' 2"  (1.575 m), weight 79 kg, SpO2 95%.  PHYSICAL EXAMINATION:   GENERAL:  82 y.o.-year-old patient with no acute distress. Obese, LUNGS: Normal breath sounds bilaterally, no wheezing CARDIOVASCULAR: S1, S2 normal. No murmur   ABDOMEN: Soft, nontender, nondistended. EXTREMITIES: No  edema b/l.    NEUROLOGIC: nonfocal  patient is alert and awake, at baseline has dementia SKIN:bruise over the right cheek  LABORATORY PANEL:  CBC Recent Labs  Lab 06/22/23 0158  WBC 7.9  HGB 13.0  HCT 40.3  PLT 193    Chemistries  Recent Labs  Lab 06/22/23 0158  NA 135  K 3.8  CL 103  CO2 24  GLUCOSE 156*  BUN 17  CREATININE 0.74  CALCIUM 8.4*  AST 41  ALT 28  ALKPHOS 65  BILITOT 0.5   Cardiac Enzymes No results for input(s): "TROPONINI" in the last 168 hours. RADIOLOGY:  No results found.  Assessment and Plan  Cynthia Vaughan is a 82 y.o. female with medical history significant of advanced dementia, HTN, HLD, anxiety/depression, brought in by family member for evaluation of worsening of productive cough.   Patient has advanced dementia unable to provide any history, all history provided by husband over the phone.  Seventieths ago, husband started to have tearing in the eyes and runny nose, 3 days later patient started to have similar symptoms of eye discharges and runny nose, soon the clear drainage from the eye turned to cloudy and crusty, and patient started to have productive cough with thin phlegm, husband also reported similar symptoms himself.    Acute  hypoxic respiratory failure acute right maxillary sinusitis acute bronchitis -Likely related to increased postnasal drip -Augmentin 875 BID for seven days -Sputum culture, atypical study and strep antigens -Other supportive care including incentive spirometry and flutter valve.  DuoNebs as needed -- afebrile. Weaned oxygen down to room air. -- No evidence of pneumonia and chest x-ray.   Acute conjunctivitis -Probably viral initially and superimposed with bacterial infection, trial of antibiotic eyedrops - respiratory panel-- negative   Advanced dementia -Mentation at baseline. -Continue memantine and Aricept   HTN -Stable, continue amlodipine   Anxiety/depression -continue home meds   fall with right cheek bruise -- PT OT -->rehab.  -- Per husband patient does not like to use walker. At home she takes support of walls and furniture to get around.      DVT prophylaxis: Lovenox Code Status: DNR Family Communication: Husband over the phone Level of care: Telemetry Medical Status is: Inpatient Remains inpatient appropriate because: monitor for another day. PT OT to work with patient.    TOTAL TIME TAKING CARE OF THIS PATIENT: 35 minutes.  >50% time spent on counselling and coordination of care  Note: This dictation was prepared with Dragon dictation along with smaller phrase technology. Any transcriptional errors that result from this process are unintentional.  Enedina Finner M.D    [facility]   CC: Primary care physician; Dorcas Carrow, DO

## 2023-06-24 NOTE — Progress Notes (Signed)
Physical Therapy Treatment Patient Details Name: Cynthia Vaughan MRN: [id number] DOB: [birth year] Today's Date: 06/24/2023   History of Present Illness Pt is an 82 y.o. female presenting to hospital 06/21/23 with c/o productive cough and nasal congestion for past 5 days; also with SOB and generalized weakness; multiple falls during this time d/t generalized weakness.  Pt admitted with acute hypoxic respiratory failure secondary to CAP, acute conjunctivitis.  PMH includes CKD, htn, dementia, anxiety/depression.    PT Comments  Pt is progressing slowly with activity tolerance.  Pt demonstrated no shakiness during mobility and functional standing balance as reported last PT session and was able to perform mobility on RA keeping SPO2 > 90% overall.  Pt performs bed mobility, transfers, and gait with RW at [facility].  Gait distance is still limited due to pt's low activity tolerance. Continued PT will assist pt towards greater activity tolerance, balance, and safety with moblity.    If plan is discharge home, recommend the following: A little help with walking and/or transfers;A little help with bathing/dressing/bathroom;Assistance with cooking/housework;Direct supervision/assist for medications management;Direct supervision/assist for financial management;Assist for transportation;Help with stairs or ramp for entrance   Can travel by private vehicle     Yes  Equipment Recommendations  None recommended by PT    Recommendations for Other Services       Precautions / Restrictions Precautions Precautions: Fall Restrictions Weight Bearing Restrictions: No     Mobility  Bed Mobility Overal bed mobility: Needs Assistance Bed Mobility: Rolling, Sidelying to Sit Rolling: Supervision Sidelying to sit: Contact guard assist            Transfers Overall transfer level: Needs assistance Equipment used: Rolling walker (2 wheels) Transfers: Sit to/from Stand, Bed to chair/wheelchair/BSC Sit to  Stand: Supervision   Step pivot transfers: Contact guard assist       General transfer comment: vc's for UE placement; vc's to line up to surface prior to sitting    Ambulation/Gait Ambulation/Gait assistance: Contact guard assist, Min assist Gait Distance (Feet): 10 Feet (x2) Assistive device: Rolling walker (2 wheels) Gait Pattern/deviations: Step-through pattern, Decreased step length - right, Decreased step length - left Gait velocity: decreased     General Gait Details: Less effort, no shakiness; limited distance ambulating d/t SOB and fatigue   Stairs             Wheelchair Mobility     Tilt Bed    Modified Rankin (Stroke Patients Only)       Balance Overall balance assessment: Needs assistance Sitting-balance support: No upper extremity supported, Feet unsupported Sitting balance-Leahy Scale: Good Sitting balance - Comments: steady reaching within BOS   Standing balance support: Bilateral upper extremity supported, During functional activity, Reliant on assistive device for balance Standing balance-Leahy Scale: Fair Standing balance comment: no shakiness in standing with B UE support on RW                            Cognition Arousal: Alert Behavior During Therapy: WFL for tasks assessed/performed Overall Cognitive Status: History of cognitive impairments - at baseline                                 General Comments: Oriented to self and hospital.        Exercises      General Comments        Pertinent Vitals/Pain Pain  Assessment Pain Assessment: Faces Faces Pain Scale: Hurts a little bit Pain Location: back Pain Descriptors / Indicators: Aching Pain Intervention(s): Monitored during session    Home Living                          Prior Function            PT Goals (current goals can now be found in the care plan section) Acute Rehab PT Goals Patient Stated Goal: to improve strength and  mobility PT Goal Formulation: With patient Time For Goal Achievement: 07/07/23 Potential to Achieve Goals: Good Progress towards PT goals: Progressing toward goals    Frequency    Min 1X/week      PT Plan      Co-evaluation              AM-PAC PT "6 Clicks" Mobility   Outcome Measure  Help needed turning from your back to your side while in a flat bed without using bedrails?: A Little Help needed moving from lying on your back to sitting on the side of a flat bed without using bedrails?: A Little Help needed moving to and from a bed to a chair (including a wheelchair)?: A Little Help needed standing up from a chair using your arms (e.g., wheelchair or bedside chair)?: A Little Help needed to walk in hospital room?: A Little Help needed climbing 3-5 steps with a railing? : A Lot 6 Click Score: 17    End of Session Equipment Utilized During Treatment: Gait belt Activity Tolerance: Patient limited by fatigue Patient left: in chair;with call bell/phone within reach;with chair alarm set;with nursing/sitter in room Nurse Communication: Mobility status PT Visit Diagnosis: Unsteadiness on feet (R26.81);Other abnormalities of gait and mobility (R26.89);Muscle weakness (generalized) (M62.81);History of falling (Z91.81)     Time: 1610-9604 PT Time Calculation (min) (ACUTE ONLY): 15 min  Charges:    $Therapeutic Activity: 8-22 mins PT General Charges $$ ACUTE PT VISIT: 1 Visit                     Hortencia Conradi, PTA  06/24/23, 9:45 AM

## 2023-06-24 NOTE — Progress Notes (Signed)
Pt's meds given early this evening per her request.  Pt stated she was very tired this evening and just wanted to go to sleep.

## 2023-06-25 DIAGNOSIS — Y92009 Unspecified place in unspecified non-institutional (private) residence as the place of occurrence of the external cause: Secondary | ICD-10-CM | POA: Diagnosis not present

## 2023-06-25 DIAGNOSIS — F0390 Unspecified dementia without behavioral disturbance: Secondary | ICD-10-CM | POA: Diagnosis not present

## 2023-06-25 DIAGNOSIS — J0100 Acute maxillary sinusitis, unspecified: Secondary | ICD-10-CM | POA: Diagnosis not present

## 2023-06-25 DIAGNOSIS — W19XXXA Unspecified fall, initial encounter: Secondary | ICD-10-CM | POA: Diagnosis not present

## 2023-06-25 MED ORDER — AMOXICILLIN-POT CLAVULANATE 875-125 MG PO TABS: 1.0000 | ORAL_TABLET | Freq: Two times a day (BID) | ORAL | 0 refills | Status: AC

## 2023-06-25 MED ORDER — RISAQUAD PO CAPS: 2.0000 | ORAL_CAPSULE | Freq: Every day | ORAL | 0 refills | Status: AC

## 2023-06-25 MED ORDER — RISAQUAD PO CAPS
2.0000 | ORAL_CAPSULE | Freq: Every day | ORAL | Status: DC
  Administered 2023-06-25: 2 via ORAL
  Filled 2023-06-25: qty 2

## 2023-06-25 MED ORDER — LOPERAMIDE HCL 2 MG PO CAPS: 2.0000 mg | ORAL_CAPSULE | Freq: Four times a day (QID) | ORAL | 0 refills | Status: DC | PRN

## 2023-06-25 NOTE — Progress Notes (Signed)
Physical Therapy Treatment Patient Details Name: Cynthia Vaughan MRN: [id number] DOB: 1940-10-09 Today's Date: 06/25/2023   History of Present Illness Pt is an 82 y.o. female presenting to hospital 06/21/23 with c/o productive cough and nasal congestion for past 5 days; also with SOB and generalized weakness; multiple falls during this time d/t generalized weakness.  Pt admitted with acute hypoxic respiratory failure secondary to CAP, acute conjunctivitis.  PMH includes CKD, htn, dementia, anxiety/depression.    PT Comments  To EOB with min a x 1 and verbal cues.  Found to be inc of BM in bed and care given with new gown.  She is able to walk 40' with RW and slow gait with min a x 1.  To PT gym for stair training.  She is able to go up/down 4 steps with B rails by going up forward/back down.  On first step knees do buckle but she is able to prevent fall with min a x 1.  After seated rest, she is able to complete 4 steps forward up and down with B rails.  She is inc loose BM on stairs.  Returned to room via wheelchair where she denied having to use bathroom but when placed on commode has large loose BM.  Transfers wheelchair <-> commode with cga x 1.  Upon sitting in recliner, she stated she needed to void and walked in/out of bathroom with RW and cga/min a x 1.  Full assist for care and barrier cream applied to protect skin.  Labile during session.  Discussed with MD as husband would like to take pt home vs rehab.  She will need +1 assist for safety with mobility, assist for care and would recommend +2 for stairs to enter home if available for safety.  Per eval pt has all equipment at home but was not using it.  Recommended walker at all times and wheelchair for outside ambulation for safety.  HHPT/OT if he chooses home and would benefit from any aide service available to him.   If plan is discharge home, recommend the following: A little help with walking and/or transfers;A little help with  bathing/dressing/bathroom;Assistance with cooking/housework;Direct supervision/assist for medications management;Direct supervision/assist for financial management;Assist for transportation;Help with stairs or ramp for entrance   Can travel by private vehicle        Equipment Recommendations  None recommended by PT    Recommendations for Other Services       Precautions / Restrictions Precautions Precautions: Fall Restrictions Weight Bearing Restrictions: No     Mobility  Bed Mobility Overal bed mobility: Needs Assistance Bed Mobility: Supine to Sit   Sidelying to sit: Min assist            Transfers Overall transfer level: Needs assistance Equipment used: Rolling walker (2 wheels) Transfers: Sit to/from Stand, Bed to chair/wheelchair/BSC Sit to Stand: Supervision, Contact guard assist   Step pivot transfers: Contact guard assist            Ambulation/Gait Ambulation/Gait assistance: Contact guard assist, Min assist Gait Distance (Feet): 40 Feet Assistive device: Rolling walker (2 wheels)   Gait velocity: decreased     General Gait Details: flexed posture and short shuffling steps   Stairs Stairs: Yes Stairs assistance: Contact guard assist, Min assist Stair Management: Forwards, Two rails Number of Stairs: 8     Wheelchair Mobility     Tilt Bed    Modified Rankin (Stroke Patients Only)       Balance Overall balance  assessment: Needs assistance Sitting-balance support: No upper extremity supported, Feet unsupported Sitting balance-Leahy Scale: Good Sitting balance - Comments: steady reaching within BOS   Standing balance support: Bilateral upper extremity supported, During functional activity, Reliant on assistive device for balance Standing balance-Leahy Scale: Fair                              Cognition Arousal: Alert Behavior During Therapy: Lability, WFL for tasks assessed/performed Overall Cognitive Status: History  of cognitive impairments - at baseline                                 General Comments: did not know where she was - orientated to place        Exercises Other Exercises Other Exercises: to bathroom x 2 during session due to loose stools and did not void on first attempt needing to go back    General Comments        Pertinent Vitals/Pain Pain Assessment Pain Assessment: No/denies pain    Home Living                          Prior Function            PT Goals (current goals can now be found in the care plan section) Progress towards PT goals: Progressing toward goals    Frequency    Min 1X/week      PT Plan      Co-evaluation              AM-PAC PT "6 Clicks" Mobility   Outcome Measure  Help needed turning from your back to your side while in a flat bed without using bedrails?: A Little Help needed moving from lying on your back to sitting on the side of a flat bed without using bedrails?: A Little Help needed moving to and from a bed to a chair (including a wheelchair)?: A Little Help needed standing up from a chair using your arms (e.g., wheelchair or bedside chair)?: A Little Help needed to walk in hospital room?: A Little Help needed climbing 3-5 steps with a railing? : A Little 6 Click Score: 18    End of Session Equipment Utilized During Treatment: Gait belt Activity Tolerance: Patient tolerated treatment well Patient left: in chair;with call bell/phone within reach;with chair alarm set Nurse Communication: Mobility status PT Visit Diagnosis: Unsteadiness on feet (R26.81);Other abnormalities of gait and mobility (R26.89);Muscle weakness (generalized) (M62.81);History of falling (Z91.81)     Time: 5409-8119 PT Time Calculation (min) (ACUTE ONLY): 28 min  Charges:    $Gait Training: 23-37 mins PT General Charges $$ ACUTE PT VISIT: 1 Visit                   Danielle Dess, PTA 06/25/23, 10:08 AM

## 2023-06-25 NOTE — Plan of Care (Signed)

## 2023-06-25 NOTE — Discharge Summary (Signed)
Physician Discharge Summary   Patient: Cynthia Vaughan MRN: [id number] DOB: [birth year]  Admit date:     06/22/2023  Discharge date: 06/25/23  Discharge Physician: Enedina Finner   PCP: Dorcas Carrow, DO   Recommendations at discharge:    F/u PCP in 1-2 weeks  Discharge Diagnoses: Acute right maxillary Sinusitis  Cynthia Vaughan is a 82 y.o. female with medical history significant of advanced dementia, HTN, HLD, anxiety/depression, brought in by family member for evaluation of worsening of productive cough.   Patient has advanced dementia unable to provide any history, all history provided by husband over the phone.  Seventieths ago, husband started to have tearing in the eyes and runny nose, 3 days later patient started to have similar symptoms of eye discharges and runny nose, soon the clear drainage from the eye turned to cloudy and crusty, and patient started to have productive cough with thin phlegm, husband also reported similar symptoms himself.     Acute hypoxic respiratory failure--resolved acute right maxillary sinusitis acute bronchitis -Likely related to increased postnasal drip -Augmentin 875 BID for seven days -Sputum culture, atypical study and strep antigens -Other supportive care including incentive spirometry and flutter valve.  DuoNebs as needed -- afebrile. Weaned oxygen down to room air. -- No evidence of pneumonia and chest x-ray.   Acute conjunctivitis -Probably viral initially and superimposed with bacterial infection, trial of antibiotic eyedrops --respiratory panel-- negative --pt refused to use ED per RN   Advanced dementia -Mentation at baseline. -Continue memantine and Aricept   HTN -Stable, continue amlodipine   Anxiety/depression -continue home meds    fall with right cheek bruise -- PT OT -->rehab.  -- Per husband patient does not like to use walker. At home she takes support of walls and furniture to get around.   Diarrhea --Pt had  diarrhea today. Prn lomotil and risaquad --encourage po intake   Pt and husband would like to go home. HHPT arranged. They both declined rehab      DVT prophylaxis: Lovenox Code Status: DNR Family Communication: Husband in the room   Disposition: Home health Diet recommendation:  Discharge Diet Orders (From admission, onward)     Start     Ordered   06/25/23 0000  Diet - low sodium heart healthy        06/25/23 1220           Cardiac diet DISCHARGE MEDICATION: Allergies as of 06/25/2023       Reactions   Codeine Itching   Lisinopril Cough        Medication List     TAKE these medications    acetaminophen 500 MG tablet Commonly known as: TYLENOL Take 500 mg by mouth every 6 (six) hours as needed.   acidophilus Caps capsule Take 2 capsules by mouth daily for 7 days. Start taking on: June 26, 2023   amLODipine 2.5 MG tablet Commonly known as: NORVASC Take 1 tablet (2.5 mg total) by mouth daily.   amoxicillin-clavulanate 875-125 MG tablet Commonly known as: AUGMENTIN Take 1 tablet by mouth every 12 (twelve) hours for 2 days.   aspirin EC 81 MG tablet Take 81 mg by mouth daily.   atorvastatin 40 MG tablet Commonly known as: LIPITOR Take 1 tablet (40 mg total) by mouth daily.   buPROPion 150 MG 24 hr tablet Commonly known as: Wellbutrin XL Take 1 tablet (150 mg total) by mouth daily.   CALCIUM 600 + D PO Take by mouth daily.  donepezil 10 MG tablet Commonly known as: ARICEPT Take 10 mg by mouth at bedtime.   DULoxetine 60 MG capsule Commonly known as: CYMBALTA Take 1 capsule (60 mg total) by mouth daily.   fluticasone 50 MCG/ACT nasal spray Commonly known as: FLONASE Place 2 sprays into both nostrils daily.   gabapentin 100 MG capsule Commonly known as: NEURONTIN Take 100 mg in the morning and 100 mg in the afternoon and 300 mg at night   loperamide 2 MG capsule Commonly known as: IMODIUM Take 1 capsule (2 mg total) by mouth every  6 (six) hours as needed for diarrhea or loose stools.   loratadine 10 MG tablet Commonly known as: CLARITIN Take 1 tablet (10 mg total) by mouth daily.   losartan 50 MG tablet Commonly known as: COZAAR Take 1 tablet (50 mg total) by mouth daily.   memantine 5 MG tablet Commonly known as: NAMENDA Take 5 mg by mouth daily.   mirtazapine 15 MG tablet Commonly known as: REMERON Take 1 tablet by mouth at bedtime.   omeprazole 20 MG capsule Commonly known as: PRILOSEC Take 1 capsule (20 mg total) by mouth daily.   QUEtiapine 25 MG tablet Commonly known as: SEROQUEL Take 1 tablet (25 mg total) by mouth at bedtime.   traMADol 50 MG tablet Commonly known as: ULTRAM Take 1 tablet (50 mg total) by mouth 2 (two) times daily as needed.   traZODone 50 MG tablet Commonly known as: DESYREL Take 25 mg by mouth at bedtime as needed.        Follow-up Information     Olevia Perches P, DO. Schedule an appointment as soon as possible for a visit in 1 week(s).   Specialty: Family Medicine Contact information: [address] [place] [address] [phone]                Discharge Exam: Filed Weights   06/21/23 2345 06/22/23 1601  Weight: 83.9 kg 79 kg  GENERAL:  82 y.o.-year-old patient with no acute distress. Obese, LUNGS: Normal breath sounds bilaterally, no wheezing CARDIOVASCULAR: S1, S2 normal. No murmur   ABDOMEN: Soft, nontender, nondistended. EXTREMITIES: No  edema b/l.    NEUROLOGIC: nonfocal  patient is alert and awake, at baseline has dementia SKIN:bruise over the right cheek   Condition at discharge: fair  The results of significant diagnostics from this hospitalization (including imaging, microbiology, ancillary and laboratory) are listed below for reference.   Imaging Studies: CT Maxillofacial Wo Contrast  Result Date: 06/22/2023 CLINICAL DATA:  Blunt trauma due to fall on face EXAM: CT HEAD WITHOUT CONTRAST CT MAXILLOFACIAL WITHOUT CONTRAST CT CERVICAL  SPINE WITHOUT CONTRAST TECHNIQUE: Multidetector CT imaging of the head, cervical spine, and maxillofacial structures were performed using the standard protocol without intravenous contrast. Multiplanar CT image reconstructions of the cervical spine and maxillofacial structures were also generated. RADIATION DOSE REDUCTION: This exam was performed according to the departmental dose-optimization program which includes automated exposure control, adjustment of the mA and/or kV according to patient size and/or use of iterative reconstruction technique. COMPARISON:  Head CT 12/18/2017 FINDINGS: CT HEAD FINDINGS Brain: No evidence of acute infarction, hemorrhage, hydrocephalus, extra-axial collection or mass lesion/mass effect. Brain atrophy most notable in the frontal lobes. Ischemic gliosis in the cerebral white matter. Vascular: No hyperdense vessel when comparing side to side on sagittal reformats Skull: No acute fracture CT MAXILLOFACIAL FINDINGS Osseous: No fracture or mandibular dislocation. No destructive process. Orbits: No visible injury Sinuses: Generalized mucosal thickening in  the paranasal sinuses. There is a primarily low-density right maxillary sinus fluid level, inflammatory appearing. Soft tissues: No hematoma or foreign body seen. CT CERVICAL SPINE FINDINGS Alignment: No traumatic malalignment Skull base and vertebrae: No acute fracture. No primary bone lesion or focal pathologic process. Generalized osteopenia Soft tissues and spinal canal: No prevertebral fluid or swelling. No visible canal hematoma. Disc levels: Generalized degenerative endplate and facet spurring. Facet ankylosis at C3-4. Upper chest: No visible injury IMPRESSION: 1. No evidence of acute intracranial or cervical spine injury. Negative for facial fracture. 2. Active sinus inflammation including right maxillary fluid level. Electronically Signed   By: Tiburcio Pea M.D.   On: 06/22/2023 04:11   CT Head Wo Contrast  Result Date:  06/22/2023 CLINICAL DATA:  Blunt trauma due to fall on face EXAM: CT HEAD WITHOUT CONTRAST CT MAXILLOFACIAL WITHOUT CONTRAST CT CERVICAL SPINE WITHOUT CONTRAST TECHNIQUE: Multidetector CT imaging of the head, cervical spine, and maxillofacial structures were performed using the standard protocol without intravenous contrast. Multiplanar CT image reconstructions of the cervical spine and maxillofacial structures were also generated. RADIATION DOSE REDUCTION: This exam was performed according to the departmental dose-optimization program which includes automated exposure control, adjustment of the mA and/or kV according to patient size and/or use of iterative reconstruction technique. COMPARISON:  Head CT 12/18/2017 FINDINGS: CT HEAD FINDINGS Brain: No evidence of acute infarction, hemorrhage, hydrocephalus, extra-axial collection or mass lesion/mass effect. Brain atrophy most notable in the frontal lobes. Ischemic gliosis in the cerebral white matter. Vascular: No hyperdense vessel when comparing side to side on sagittal reformats Skull: No acute fracture CT MAXILLOFACIAL FINDINGS Osseous: No fracture or mandibular dislocation. No destructive process. Orbits: No visible injury Sinuses: Generalized mucosal thickening in the paranasal sinuses. There is a primarily low-density right maxillary sinus fluid level, inflammatory appearing. Soft tissues: No hematoma or foreign body seen. CT CERVICAL SPINE FINDINGS Alignment: No traumatic malalignment Skull base and vertebrae: No acute fracture. No primary bone lesion or focal pathologic process. Generalized osteopenia Soft tissues and spinal canal: No prevertebral fluid or swelling. No visible canal hematoma. Disc levels: Generalized degenerative endplate and facet spurring. Facet ankylosis at C3-4. Upper chest: No visible injury IMPRESSION: 1. No evidence of acute intracranial or cervical spine injury. Negative for facial fracture. 2. Active sinus inflammation including  right maxillary fluid level. Electronically Signed   By: Tiburcio Pea M.D.   On: 06/22/2023 04:11   CT Cervical Spine Wo Contrast  Result Date: 06/22/2023 CLINICAL DATA:  Blunt trauma due to fall on face EXAM: CT HEAD WITHOUT CONTRAST CT MAXILLOFACIAL WITHOUT CONTRAST CT CERVICAL SPINE WITHOUT CONTRAST TECHNIQUE: Multidetector CT imaging of the head, cervical spine, and maxillofacial structures were performed using the standard protocol without intravenous contrast. Multiplanar CT image reconstructions of the cervical spine and maxillofacial structures were also generated. RADIATION DOSE REDUCTION: This exam was performed according to the departmental dose-optimization program which includes automated exposure control, adjustment of the mA and/or kV according to patient size and/or use of iterative reconstruction technique. COMPARISON:  Head CT 12/18/2017 FINDINGS: CT HEAD FINDINGS Brain: No evidence of acute infarction, hemorrhage, hydrocephalus, extra-axial collection or mass lesion/mass effect. Brain atrophy most notable in the frontal lobes. Ischemic gliosis in the cerebral white matter. Vascular: No hyperdense vessel when comparing side to side on sagittal reformats Skull: No acute fracture CT MAXILLOFACIAL FINDINGS Osseous: No fracture or mandibular dislocation. No destructive process. Orbits: No visible injury Sinuses: Generalized mucosal thickening in the paranasal sinuses. There is a primarily  low-density right maxillary sinus fluid level, inflammatory appearing. Soft tissues: No hematoma or foreign body seen. CT CERVICAL SPINE FINDINGS Alignment: No traumatic malalignment Skull base and vertebrae: No acute fracture. No primary bone lesion or focal pathologic process. Generalized osteopenia Soft tissues and spinal canal: No prevertebral fluid or swelling. No visible canal hematoma. Disc levels: Generalized degenerative endplate and facet spurring. Facet ankylosis at C3-4. Upper chest: No visible  injury IMPRESSION: 1. No evidence of acute intracranial or cervical spine injury. Negative for facial fracture. 2. Active sinus inflammation including right maxillary fluid level. Electronically Signed   By: Tiburcio Pea M.D.   On: 06/22/2023 04:11   DG Chest 1 View  Result Date: 06/22/2023 CLINICAL DATA:  Cough EXAM: CHEST  1 VIEW COMPARISON:  Chest x-ray 12/18/2017 FINDINGS: The aorta is ectatic, unchanged. Heart size is within normal limits. There is no focal lung infiltrate, pleural effusion or pneumothorax. No acute fractures are seen. IMPRESSION: No active disease. Electronically Signed   By: Darliss Cheney M.D.   On: 06/22/2023 01:41    Microbiology: Results for orders placed or performed during the hospital encounter of 06/22/23  Resp panel by RT-PCR (RSV, Flu A&B, Covid) Anterior Nasal Swab     Status: None   Collection Time: 06/21/23 11:46 PM   Specimen: Anterior Nasal Swab  Result Value Ref Range Status   SARS Coronavirus 2 by RT PCR NEGATIVE NEGATIVE Final    Comment: (NOTE) SARS-CoV-2 target nucleic acids are NOT DETECTED.  The SARS-CoV-2 RNA is generally detectable in upper respiratory specimens during the acute phase of infection. The lowest concentration of SARS-CoV-2 viral copies this assay can detect is 138 copies/mL. A negative result does not preclude SARS-Cov-2 infection and should not be used as the sole basis for treatment or other patient management decisions. A negative result may occur with  improper specimen collection/handling, submission of specimen other than nasopharyngeal swab, presence of viral mutation(s) within the areas targeted by this assay, and inadequate number of viral copies(<138 copies/mL). A negative result must be combined with clinical observations, patient history, and epidemiological information. The expected result is Negative.  Fact Sheet for Patients:  BloggerCourse.com  Fact Sheet for Healthcare Providers:   SeriousBroker.it  This test is no t yet approved or cleared by the [place] FDA and  has been authorized for detection and/or diagnosis of SARS-CoV-2 by FDA under an Emergency Use Authorization (EUA). This EUA will remain  in effect (meaning this test can be used) for the duration of the COVID-19 declaration under Section 564(b)(1) of the Act, 21 U.S.C.section 360bbb-3(b)(1), unless the authorization is terminated  or revoked sooner.       Influenza A by PCR NEGATIVE NEGATIVE Final   Influenza B by PCR NEGATIVE NEGATIVE Final    Comment: (NOTE) The Xpert Xpress SARS-CoV-2/FLU/RSV plus assay is intended as an aid in the diagnosis of influenza from Nasopharyngeal swab specimens and should not be used as a sole basis for treatment. Nasal washings and aspirates are unacceptable for Xpert Xpress SARS-CoV-2/FLU/RSV testing.  Fact Sheet for Patients: BloggerCourse.com  Fact Sheet for Healthcare Providers: SeriousBroker.it  This test is not yet approved or cleared by the [place] FDA and has been authorized for detection and/or diagnosis of SARS-CoV-2 by FDA under an Emergency Use Authorization (EUA). This EUA will remain in effect (meaning this test can be used) for the duration of the COVID-19 declaration under Section 564(b)(1) of the Act, 21 U.S.C. section 360bbb-3(b)(1), unless the authorization is  terminated or revoked.     Resp Syncytial Virus by PCR NEGATIVE NEGATIVE Final    Comment: (NOTE) Fact Sheet for Patients: BloggerCourse.com  Fact Sheet for Healthcare Providers: SeriousBroker.it  This test is not yet approved or cleared by the [place] FDA and has been authorized for detection and/or diagnosis of SARS-CoV-2 by FDA under an Emergency Use Authorization (EUA). This EUA will remain in effect (meaning this test can be used) for  the duration of the COVID-19 declaration under Section 564(b)(1) of the Act, 21 U.S.C. section 360bbb-3(b)(1), unless the authorization is terminated or revoked.  Performed at [facility], [address] [place], [place] [address]   Respiratory (~20 pathogens) panel by PCR     Status: None   Collection Time: 06/22/23 11:50 AM   Specimen: Nasopharyngeal Swab; Respiratory  Result Value Ref Range Status   Adenovirus NOT DETECTED NOT DETECTED Final   Coronavirus 229E NOT DETECTED NOT DETECTED Final    Comment: (NOTE) The Coronavirus on the Respiratory Panel, DOES NOT test for the novel  Coronavirus (2019 nCoV)    Coronavirus HKU1 NOT DETECTED NOT DETECTED Final   Coronavirus NL63 NOT DETECTED NOT DETECTED Final   Coronavirus OC43 NOT DETECTED NOT DETECTED Final   Metapneumovirus NOT DETECTED NOT DETECTED Final   Rhinovirus / Enterovirus NOT DETECTED NOT DETECTED Final   Influenza A NOT DETECTED NOT DETECTED Final   Influenza B NOT DETECTED NOT DETECTED Final   Parainfluenza Virus 1 NOT DETECTED NOT DETECTED Final   Parainfluenza Virus 2 NOT DETECTED NOT DETECTED Final   Parainfluenza Virus 3 NOT DETECTED NOT DETECTED Final   Parainfluenza Virus 4 NOT DETECTED NOT DETECTED Final   Respiratory Syncytial Virus NOT DETECTED NOT DETECTED Final   Bordetella pertussis NOT DETECTED NOT DETECTED Final   Bordetella Parapertussis NOT DETECTED NOT DETECTED Final   Chlamydophila pneumoniae NOT DETECTED NOT DETECTED Final   Mycoplasma pneumoniae NOT DETECTED NOT DETECTED Final    Comment: Performed at [facility], [address], [place], [place] [address]    Labs: CBC: Recent Labs  Lab 06/22/23 0158  WBC 7.9  NEUTROABS 4.3  HGB 13.0  HCT 40.3  MCV 99.8  PLT 193   Basic Metabolic Panel: Recent Labs  Lab 06/22/23 0158  NA 135  K 3.8  CL 103  CO2 24  GLUCOSE 156*  BUN 17  CREATININE 0.74  CALCIUM 8.4*   Liver Function Tests: Recent Labs  Lab  06/22/23 0158  AST 41  ALT 28  ALKPHOS 65  BILITOT 0.5  PROT 7.2  ALBUMIN 3.4*    Discharge time spent: greater than 30 minutes.  Signed: Enedina Finner, MD [facility] 06/25/2023

## 2023-06-25 NOTE — TOC Transition Note (Signed)
Transition of Care [facility]) - CM/SW Discharge Note   Patient Details  Name: Cynthia Vaughan MRN: [id number] Date of Birth: [birth year]  [facility] CM/SW Contact:  Hetty Ely, RN Phone Number: 06/25/2023, 4:15 PM   Final next level of care: Home w Home Health Services Barriers to Discharge: Barriers Resolved                 Name of family member notified: Husband, [name] Patient and family notified of of transfer: 06/25/23  Discharge Plan and Services Additional resources added to the After Visit Summary for                  DME Arranged: N/A DME Agency: NA       HH Arranged: RN, PT HH Agency: [facility] Date [facility] Agency Contacted: 06/25/23   Representative spoke with at [facility] Agency: Elnita Maxwell  Social Determinants of Health (SDOH) Interventions SDOH Screenings   Food Insecurity: No Food Insecurity (06/22/2023)  Housing: Low Risk  (06/22/2023)  Transportation Needs: No Transportation Needs (06/22/2023)  Utilities: Not At Risk (06/22/2023)  Alcohol Screen: Low Risk  (06/20/2022)  Depression (PHQ2-9): High Risk (06/13/2023)  Financial Resource Strain: Low Risk  (06/13/2023)  Physical Activity: Unknown (06/13/2023)  Recent Concern: Physical Activity - Inactive (06/13/2023)  Social Connections: Moderately Isolated (06/13/2023)  Stress: No Stress Concern Present (06/13/2023)  Tobacco Use: Medium Risk (06/21/2023)     Readmission Risk Interventions     No data to display

## 2023-06-25 NOTE — Progress Notes (Signed)
CM spoke with patient and husband. Patient voices being independent with ADL's, no home services, declines SNF, receptive to [facility] services however no preference. Husband receptive to patient's decision. MD aware, HHPT/RN will be arranged.

## 2023-06-26 ENCOUNTER — Telehealth: Payer: Self-pay

## 2023-06-26 NOTE — Transitions of Care (Post Inpatient/ED Visit) (Signed)
06/26/2023  Name: Cynthia Vaughan MRN: [id number] DOB: [birth year]  Today's TOC FU Call Status: Today's TOC FU Call Status:: Successful TOC FU Call Completed TOC FU Call Complete Date: 06/26/23 Patient's Name and Date of Birth confirmed.  Transition Care Management Follow-up Telephone Call Date of Discharge: 06/25/23 Discharge Facility: [facility]) Type of Discharge: Inpatient Admission Primary Inpatient Discharge Diagnosis:: Acute right maxillary Sinusitis How have you been since you were released from the hospital?: Better Any questions or concerns?: No  Items Reviewed: Did you receive and understand the discharge instructions provided?: Yes Medications obtained,verified, and reconciled?: Yes (Medications Reviewed) Any new allergies since your discharge?: No Dietary orders reviewed?: Yes Type of Diet Ordered:: Reg Heart healthy, NSS, appetite ok will enc yogurt Do you have support at home?: Yes People in Home: spouse Name of Support/Comfort Primary Source: [name]  Medications Reviewed Today: Medications Reviewed Today     Reviewed by Johnnette Barrios, RN (Registered Nurse) on 06/26/23 at 1509  Med List Status: <None>   Medication Order Taking? Sig Documenting Provider Last Dose Status Informant  acetaminophen (TYLENOL) 500 MG tablet [id number] Yes Take 500 mg by mouth every 6 (six) hours as needed. [provider] Taking Active Self           Med Note Ashok Croon Jun 22, 2023  1:57 PM)    acidophilus (RISAQUAD) CAPS capsule [id number] No Take 2 capsules by mouth daily for 7 days.  Patient not taking: Reported on 06/26/2023   Enedina Finner, MD Not Taking Active            Med Note Sharon Seller, Tarren Velardi Everlene Farrier Jun 26, 2023  3:04 PM) He has to pick up , starts 11/4   amLODipine (NORVASC) 2.5 MG tablet [id number] Yes Take 1 tablet (2.5 mg total) by mouth daily. Olevia Perches P, DO Taking Active Self  amoxicillin-clavulanate (AUGMENTIN)  875-125 MG tablet [id number] Yes Take 1 tablet by mouth every 12 (twelve) hours for 2 days. Enedina Finner, MD Taking Active   aspirin EC 81 MG tablet [id number] Yes Take 81 mg by mouth daily. [provider] Taking Active Self  atorvastatin (LIPITOR) 40 MG tablet [id number] Yes Take 1 tablet (40 mg total) by mouth daily. Johnson, Megan P, DO Taking Active Self  buPROPion (WELLBUTRIN XL) 150 MG 24 hr tablet [id number] Yes Take 1 tablet (150 mg total) by mouth daily. Dorcas Carrow, DO Taking Active Self  Calcium Carb-Cholecalciferol (CALCIUM 600 + D PO) [id number] Yes Take by mouth daily. [provider] Taking Active Self  donepezil (ARICEPT) 10 MG tablet [id number] Yes Take 10 mg by mouth at bedtime. [provider] Taking Active Self  DULoxetine (CYMBALTA) 60 MG capsule [id number] Yes Take 1 capsule (60 mg total) by mouth daily. Olevia Perches P, DO Taking Active Self  fluticasone (FLONASE) 50 MCG/ACT nasal spray [id number] Yes Place 2 sprays into both nostrils daily. Olevia Perches P, DO Taking Active Self  gabapentin (NEURONTIN) 100 MG capsule [id number] Yes Take 100 mg in the morning and 100 mg in the afternoon and 300 mg at night [provider] Taking Active Self  loperamide (IMODIUM) 2 MG capsule [id number] Yes Take 1 capsule (2 mg total) by mouth every 6 (six) hours as needed for diarrhea or loose stools. Enedina Finner, MD Taking Active   loratadine (CLARITIN) 10 MG tablet [id number] Yes Take 1 tablet (10 mg total) by mouth daily. Johnson, Megan P, DO  Taking Active Self  losartan (COZAAR) 50 MG tablet [id number] Yes Take 1 tablet (50 mg total) by mouth daily. Olevia Perches P, DO Taking Active Self  memantine (NAMENDA) 5 MG tablet [id number] Yes Take 5 mg by mouth daily. [provider] Taking Active Self  mirtazapine (REMERON) 15 MG tablet [id number] Yes Take 1 tablet by mouth at bedtime. [provider] Taking Active Self  omeprazole (PRILOSEC) 20 MG  capsule [id number] Yes Take 1 capsule (20 mg total) by mouth daily. Olevia Perches P, DO Taking Active Self  QUEtiapine (SEROQUEL) 25 MG tablet [id number] Yes Take 1 tablet (25 mg total) by mouth at bedtime. Olevia Perches P, DO Taking Active Self  traMADol (ULTRAM) 50 MG tablet [id number] Yes Take 1 tablet (50 mg total) by mouth 2 (two) times daily as needed. Olevia Perches P, DO Taking Active Self  traZODone (DESYREL) 50 MG tablet [id number] Yes Take 25 mg by mouth at bedtime as needed. [provider] Taking Active Self           Med Note Sharon Seller, Laqueena Hinchey L   Mon Jun 26, 2023  3:09 PM) Per spouse takes 100 mg q hs             Home Care and Equipment/Supplies: Were Home Health Services Ordered?: No ([facility] thru [employer]) Any new equipment or medical supplies ordered?: No  Functional Questionnaire: Do you need assistance with bathing/showering or dressing?: Yes Do you need assistance with meal preparation?: Yes Do you need assistance with eating?: Yes Do you have difficulty maintaining continence: Yes (wears pulls ups) Do you need assistance with getting out of bed/getting out of a chair/moving?: Yes Do you have difficulty managing or taking your medications?: Yes (Spouse manages uses Pill packs)  Follow up appointments reviewed: PCP Follow-up appointment confirmed?: No MD Provider Line Number:[phone] Given: No Follow-up Provider: Dr Laural Benes.- spouse will schedule Specialist Hospital Follow-up appointment confirmed?: Yes Date of Specialist follow-up appointment?: 07/25/23 Follow-Up Specialty Provider:: Derm 12/3 , PT - outpatient [facility] Do you need transportation to your follow-up appointment?: No (Spouse drives) Do you understand care options if your condition(s) worsen?: Yes-patient verbalized understanding  SDOH Interventions Today    Flowsheet Row Most Recent Value  SDOH Interventions   Food Insecurity Interventions Intervention Not Indicated   Housing Interventions Intervention Not Indicated  Transportation Interventions Intervention Not Indicated, Patient Resources (Friends/Family)  Utilities Interventions Intervention Not Indicated       Goals Addressed             This Visit's Progress    TOC Care Plan       Current Barriers:  Care Coordination needs related to ADL IADL limitations, Inability to perform ADL's independently, and Inability to perform IADL's independently  RNCM Clinical Goal(s):  Patient will work with the Care Management team over the next 30 days to address Transition of Care Barriers: Medication Management Support at home Provider appointments Home Health services take all medications exactly as prescribed and will call provider for medication related questions as evidenced by no missed medications no adverse side effects  attend all scheduled medical appointments: PCP post hospital ,Orthopedics,Dermatology as evidenced by no missed appts  through collaboration with RN Care manager, provider, and care team.   Interventions: Evaluation of current treatment plan related to  self management and patient's adherence to plan as established by provider  Transitions of Care:  New goal. Doctor Visits  - discussed the importance of doctor visits  Patient Goals/Self-Care Activities:  Participate in Transition of Care Program/Attend TOC scheduled calls Take all medications as prescribed Attend all scheduled provider appointments Call pharmacy for medication refills 3-7 days in advance of running out of medications Call provider office for new concerns or questions   Follow Up Plan:  Telephone follow up appointment with care management team member scheduled for:  07/04/23 @ 1:00pm The patient has been provided with contact information for the care management team and has been advised to call with any health related questions or concerns.          Routine follow-up and on-going assessment evaluation and  education of disease processes, recommended interventions for both chronic and acute medical conditions , will occur during each weekly visit along with ongoing review of symptoms ,medication reviews and reconciliation. Any updates , inconsistencies, discrepancies or acute care concerns will be addressed and routed to the correct Practitioner if indicated   Please refer to Care Plan for goals and interventions -Effectiveness of interventions, symptom management and outcomes will be evaluated  weekly during [facility] 30-day Program Outreach calls  . Any necessary  changes and updates to Care Plan will be completed episodically    Reviewed goals for care  Patient provided with Contact information and verbalized understanding with current POC.  Susa Loffler , BSN, RN Care Management Coordinator [place]   [facility] [email or web address] Direct Dial: [phone]

## 2023-06-27 DIAGNOSIS — J0100 Acute maxillary sinusitis, unspecified: Secondary | ICD-10-CM | POA: Diagnosis not present

## 2023-06-27 DIAGNOSIS — J189 Pneumonia, unspecified organism: Secondary | ICD-10-CM | POA: Diagnosis not present

## 2023-06-27 DIAGNOSIS — F0393 Unspecified dementia, unspecified severity, with mood disturbance: Secondary | ICD-10-CM | POA: Diagnosis not present

## 2023-06-27 DIAGNOSIS — F0394 Unspecified dementia, unspecified severity, with anxiety: Secondary | ICD-10-CM | POA: Diagnosis not present

## 2023-06-27 DIAGNOSIS — M4726 Other spondylosis with radiculopathy, lumbar region: Secondary | ICD-10-CM | POA: Diagnosis not present

## 2023-06-27 DIAGNOSIS — H109 Unspecified conjunctivitis: Secondary | ICD-10-CM | POA: Diagnosis not present

## 2023-06-27 DIAGNOSIS — E785 Hyperlipidemia, unspecified: Secondary | ICD-10-CM | POA: Diagnosis not present

## 2023-06-27 DIAGNOSIS — I10 Essential (primary) hypertension: Secondary | ICD-10-CM | POA: Diagnosis not present

## 2023-06-27 DIAGNOSIS — Z9181 History of falling: Secondary | ICD-10-CM | POA: Diagnosis not present

## 2023-06-27 DIAGNOSIS — S0083XD Contusion of other part of head, subsequent encounter: Secondary | ICD-10-CM | POA: Diagnosis not present

## 2023-06-27 DIAGNOSIS — F329 Major depressive disorder, single episode, unspecified: Secondary | ICD-10-CM | POA: Diagnosis not present

## 2023-06-27 DIAGNOSIS — J209 Acute bronchitis, unspecified: Secondary | ICD-10-CM | POA: Diagnosis not present

## 2023-06-30 DIAGNOSIS — I10 Essential (primary) hypertension: Secondary | ICD-10-CM | POA: Diagnosis not present

## 2023-06-30 DIAGNOSIS — J209 Acute bronchitis, unspecified: Secondary | ICD-10-CM | POA: Diagnosis not present

## 2023-06-30 DIAGNOSIS — J189 Pneumonia, unspecified organism: Secondary | ICD-10-CM | POA: Diagnosis not present

## 2023-06-30 DIAGNOSIS — M4726 Other spondylosis with radiculopathy, lumbar region: Secondary | ICD-10-CM | POA: Diagnosis not present

## 2023-06-30 DIAGNOSIS — S0083XD Contusion of other part of head, subsequent encounter: Secondary | ICD-10-CM | POA: Diagnosis not present

## 2023-06-30 DIAGNOSIS — J0100 Acute maxillary sinusitis, unspecified: Secondary | ICD-10-CM | POA: Diagnosis not present

## 2023-07-04 ENCOUNTER — Other Ambulatory Visit: Payer: Self-pay

## 2023-07-04 NOTE — Patient Outreach (Signed)
Care Management  Transitions of Care Program Transitions of Care Post-discharge week 2   07/04/2023 Name: Cynthia Vaughan MRN: [id number] DOB: 1941-08-16  Subjective: Cynthia Vaughan is a 82 y.o. year old female who is a primary care patient of Dorcas Carrow, DO. The Care Management team Engaged with patient Engaged with patient by telephone to assess and address transitions of care needs.   Consent to Services:  Patient was given information about care management services, agreed to services, and gave verbal consent to participate.   Assessment:     Patient voices no new complaints Patient has not developed/ reported any new Medical issues / Dx or acute changes.- since last follow-up call for most recent  Hospital stay     10/31-11/3  / 2024  Spoke with patient and spouse. She is doing well getting stronger and more active. Followed [facility] ( PT). She hs occ productive cough with yellow sputum, takes OTC cough med on occ- generic no specific brand . ABT completed with no significant SE. She has not followed up with PCP, offered to assist with scheduling appt , spouse declined and stated he would schedule. He dis express he does not have anyone else that can help them @ home. Offered [facility] amb referral - he will think about this - will follow-up next week on need for assistance.  Patient educated on red flags s/s to watch for and was encouraged to report any of these identified , any new symptoms , changes in baseline or  medication regimen,  change in health status  /  well-being, or safety concerns to PCP and / or the  [facility] Case Management team .        SDOH Interventions    Flowsheet Row Telephone from 06/26/2023 in [place] [facility] Office Visit from 06/13/2023 in [facility] Clinical Support from 06/20/2022 in [facility] Office Visit from 12/30/2021 in [facility] Office Visit from  09/15/2020 in [facility] Office Visit from 06/13/2019 in [facility]  SDOH Interventions        Food Insecurity Interventions Intervention Not Indicated -- -- -- -- --  Housing Interventions Intervention Not Indicated -- Intervention Not Indicated -- -- --  Transportation Interventions Intervention Not Indicated, Patient Resources (Friends/Family) -- Intervention Not Indicated -- -- --  Utilities Interventions Intervention Not Indicated -- Intervention Not Indicated -- -- --  Alcohol Usage Interventions -- -- Intervention Not Indicated (Score <7) -- -- --  Depression Interventions/Treatment  -- Medication -- Medication Currently on Treatment Medication, Referral to Psychiatry, Currently on Treatment  Financial Strain Interventions -- -- Intervention Not Indicated -- -- --  Physical Activity Interventions -- -- Intervention Not Indicated -- -- --  Stress Interventions -- -- Intervention Not Indicated -- -- --  Social Connections Interventions -- -- Intervention Not Indicated -- -- --        Goals Addressed             This Visit's Progress    TOC Care Plan       Current Barriers:  Care Coordination needs related to ADL IADL limitations, Inability to perform ADL's independently, and Inability to perform IADL's independently  RNCM Clinical Goal(s):  Patient will work with the Care Management team over the next 30 days to address Transition of Care Barriers: Medication Management Support at home Provider appointments Home Health services take all medications  exactly as prescribed and will call provider for medication related questions as evidenced by no missed medications no adverse side effects  attend all scheduled medical appointments: PCP post hospital- needs to be scheduled  ,Orthopedics,Dermatology as evidenced by no missed appts  through collaboration with RN Care manager, provider, and care team.   Interventions: Evaluation of  current treatment plan related to  self management and patient's adherence to plan as established by provider  Transitions of Care:  Goal on track:  Yes. Doctor Visits  - discussed the importance of doctor visits  Patient Goals/Self-Care Activities: Participate in Transition of Care Program/Attend [facility] scheduled calls Take all medications as prescribed Attend all scheduled provider appointments Call pharmacy for medication refills 3-7 days in advance of running out of medications Call provider office for new concerns or questions   Follow Up Plan:  Telephone follow up appointment with care management team member scheduled for:  07/11/23 @ 1:30pm The patient has been provided with contact information for the care management team and has been advised to call with any health related questions or concerns.          Plan: The patient has been provided with contact information for the care management team and has been advised to call with any health related questions or concerns.   Routine follow-up and on-going assessment evaluation and education of disease processes, recommended interventions for both chronic and acute medical conditions , will occur during each weekly visit along with ongoing review of symptoms ,medication reviews and reconciliation. Any updates , inconsistencies, discrepancies or acute care concerns will be addressed and routed to the correct Practitioner if indicated   Please refer to Care Plan for goals and interventions -Effectiveness of interventions, symptom management and outcomes will be evaluated  weekly during [facility] 30-day Program Outreach calls  . Any necessary  changes and updates to Care Plan will be completed episodically    Reviewed goals for care  Patient provided with Contact information and verbalized understanding with current POC.   Susa Loffler , BSN, RN Care Management Coordinator [place]   [facility] [email or web address] Direct Dial: [phone]

## 2023-07-05 DIAGNOSIS — J189 Pneumonia, unspecified organism: Secondary | ICD-10-CM | POA: Diagnosis not present

## 2023-07-05 DIAGNOSIS — I10 Essential (primary) hypertension: Secondary | ICD-10-CM | POA: Diagnosis not present

## 2023-07-05 DIAGNOSIS — J0100 Acute maxillary sinusitis, unspecified: Secondary | ICD-10-CM | POA: Diagnosis not present

## 2023-07-05 DIAGNOSIS — S0083XD Contusion of other part of head, subsequent encounter: Secondary | ICD-10-CM | POA: Diagnosis not present

## 2023-07-05 DIAGNOSIS — M4726 Other spondylosis with radiculopathy, lumbar region: Secondary | ICD-10-CM | POA: Diagnosis not present

## 2023-07-05 DIAGNOSIS — J209 Acute bronchitis, unspecified: Secondary | ICD-10-CM | POA: Diagnosis not present

## 2023-07-07 ENCOUNTER — Telehealth: Payer: Self-pay | Admitting: Family Medicine

## 2023-07-07 ENCOUNTER — Ambulatory Visit
Admission: RE | Admit: 2023-07-07 | Discharge: 2023-07-07 | Disposition: A | Discharge status: Home / Self Care | Payer: Medicare Other | Source: Ambulatory Visit | Attending: Family Medicine | Admitting: Family Medicine

## 2023-07-07 ENCOUNTER — Ambulatory Visit
Admission: RE | Admit: 2023-07-07 | Discharge: 2023-07-07 | Disposition: A | Discharge status: Home / Self Care | Payer: Medicare Other | Attending: Family Medicine | Admitting: Family Medicine

## 2023-07-07 ENCOUNTER — Encounter: Payer: Self-pay | Admitting: Family Medicine

## 2023-07-07 ENCOUNTER — Ambulatory Visit: Payer: Medicare Other | Admitting: Family Medicine

## 2023-07-07 VITALS — BP 126/86 | HR 81 | Temp 98.7°F | Resp 14 | Ht 61.0 in | Wt 179.2 lb

## 2023-07-07 DIAGNOSIS — R0989 Other specified symptoms and signs involving the circulatory and respiratory systems: Secondary | ICD-10-CM

## 2023-07-07 DIAGNOSIS — R918 Other nonspecific abnormal finding of lung field: Secondary | ICD-10-CM | POA: Diagnosis not present

## 2023-07-07 NOTE — Telephone Encounter (Signed)
Copied from CRM [phone]. Topic: General - Other >> Jul 07, 2023  8:28 AM Phill Myron wrote: Home Health Verbal Orders - Caller/Agency: [facility]   Callback Number: [phone] Service Requested: Occupational Therapy Frequency:  Any new concerns about the patient? Yes  OT evaluation effective the week of  November 17

## 2023-07-07 NOTE — Progress Notes (Unsigned)
BP 126/86 (BP Location: Left Arm, Patient Position: Sitting, Cuff Size: Normal)   Pulse 81   Temp 98.7 F (37.1 C) (Oral)   Resp 14   Ht 5\' 1"  (1.549 m)   Wt 179 lb 3.2 oz (81.3 kg)   LMP  (LMP Unknown)   SpO2 92%   BMI 33.86 kg/m    Subjective:    Patient ID: Cynthia Vaughan, female    DOB: March 16, 1941, 82 y.o.   MRN: [id number]  HPI: Cynthia Vaughan is a 82 y.o. female  Chief Complaint  Patient presents with   hosptial follow up    Feeling better since she has been home. Patient and husband still suffering from the cough that was a concern prior to admission.    Transition of Care Hospital Follow up.   Hospital/Facility: D/C Physician:  D/C Date:   Records Requested: 07/07/23 Records Received:  07/07/23 Records Reviewed:  07/07/23  Diagnoses on Discharge: Acute maxillary sinusitis  Date of interactive Contact within 48 hours of discharge: 06/26/23 Contact was through: phone  Date of 7 day or 14 day face-to-face visit:  07/07/23  within 14 days  Outpatient Encounter Medications as of 07/07/2023  Medication Sig Note   acetaminophen (TYLENOL) 500 MG tablet Take 500 mg by mouth every 6 (six) hours as needed.    amLODipine (NORVASC) 2.5 MG tablet Take 1 tablet (2.5 mg total) by mouth daily.    aspirin EC 81 MG tablet Take 81 mg by mouth daily.    atorvastatin (LIPITOR) 40 MG tablet Take 1 tablet (40 mg total) by mouth daily.    buPROPion (WELLBUTRIN XL) 150 MG 24 hr tablet Take 1 tablet (150 mg total) by mouth daily.    Calcium Carb-Cholecalciferol (CALCIUM 600 + D PO) Take by mouth daily.    donepezil (ARICEPT) 10 MG tablet Take 10 mg by mouth at bedtime.    DULoxetine (CYMBALTA) 60 MG capsule Take 1 capsule (60 mg total) by mouth daily.    fluticasone (FLONASE) 50 MCG/ACT nasal spray Place 2 sprays into both nostrils daily.    gabapentin (NEURONTIN) 100 MG capsule Take 100 mg in the morning and 100 mg in the afternoon and 300 mg at night    loperamide (IMODIUM) 2  MG capsule Take 1 capsule (2 mg total) by mouth every 6 (six) hours as needed for diarrhea or loose stools.    loratadine (CLARITIN) 10 MG tablet Take 1 tablet (10 mg total) by mouth daily.    losartan (COZAAR) 50 MG tablet Take 1 tablet (50 mg total) by mouth daily.    memantine (NAMENDA) 5 MG tablet Take 5 mg by mouth daily.    mirtazapine (REMERON) 15 MG tablet Take 1 tablet by mouth at bedtime.    omeprazole (PRILOSEC) 20 MG capsule Take 1 capsule (20 mg total) by mouth daily.    QUEtiapine (SEROQUEL) 25 MG tablet Take 1 tablet (25 mg total) by mouth at bedtime.    traMADol (ULTRAM) 50 MG tablet Take 1 tablet (50 mg total) by mouth 2 (two) times daily as needed.    traZODone (DESYREL) 50 MG tablet Take 25 mg by mouth at bedtime as needed. 06/26/2023: Per spouse takes 100 mg q hs    No facility-administered encounter medications on file as of 07/07/2023.  Per hospitalist: "Cynthia Vaughan is a 82 y.o. female with medical history significant of advanced dementia, HTN, HLD, anxiety/depression, brought in by family member for evaluation of worsening of productive  cough.   Patient has advanced dementia unable to provide any history, all history provided by husband over the phone.  Seventieths ago, husband started to have tearing in the eyes and runny nose, 3 days later patient started to have similar symptoms of eye discharges and runny nose, soon the clear drainage from the eye turned to cloudy and crusty, and patient started to have productive cough with thin phlegm, husband also reported similar symptoms himself.     Acute hypoxic respiratory failure--resolved acute right maxillary sinusitis acute bronchitis -Likely related to increased postnasal drip -Augmentin 875 BID for seven days -Sputum culture, atypical study and strep antigens -Other supportive care including incentive spirometry and flutter valve.  DuoNebs as needed -- afebrile. Weaned oxygen down to room air. -- No evidence of  pneumonia and chest x-ray.   Acute conjunctivitis -Probably viral initially and superimposed with bacterial infection, trial of antibiotic eyedrops --respiratory panel-- negative --pt refused to use ED per RN   Advanced dementia -Mentation at baseline. -Continue memantine and Aricept   HTN -Stable, continue amlodipine   Anxiety/depression -continue home meds    fall with right cheek bruise -- PT OT -->rehab.  -- Per husband patient does not like to use walker. At home she takes support of walls and furniture to get around.    Diarrhea --Pt had diarrhea today. Prn lomotil and risaquad --encourage po intake     Pt and husband would like to go home. HHPT arranged. They both declined rehab"    Diagnostic Tests Reviewed: Labs, no imaging  Disposition: Home with home health  Consults: None  Discharge Instructions Follow up here in 2 weeks.   Disease/illness Education:  Home Health/Community Services Discussions/Referrals:  Human resources officer or re-establishment of referral orders for community resources:  Discussion with other health care providers:  Assessment and Support of treatment regimen adherence:  Appointments Coordinated with:   Education for self-management, independent living, and ADLs:    Relevant past medical, surgical, family and social history reviewed and updated as indicated. Interim medical history since our last visit reviewed. Allergies and medications reviewed and updated.  Review of Systems  Per HPI unless specifically indicated above     Objective:    BP 126/86 (BP Location: Left Arm, Patient Position: Sitting, Cuff Size: Normal)   Pulse 81   Temp 98.7 F (37.1 C) (Oral)   Resp 14   Ht 5\' 1"  (1.549 m)   Wt 179 lb 3.2 oz (81.3 kg)   LMP  (LMP Unknown)   SpO2 92%   BMI 33.86 kg/m   Wt Readings from Last 3 Encounters:  07/07/23 179 lb 3.2 oz (81.3 kg)  06/22/23 174 lb 2.6 oz (79 kg)  06/13/23 180 lb 6.4 oz (81.8 kg)    Physical  Exam Vitals and nursing note reviewed.  Constitutional:      General: She is not in acute distress.    Appearance: Normal appearance. She is not ill-appearing, toxic-appearing or diaphoretic.  HENT:     Head: Normocephalic and atraumatic.     Right Ear: External ear normal.     Left Ear: External ear normal.     Nose: Nose normal.     Mouth/Throat:     Mouth: Mucous membranes are moist.     Pharynx: Oropharynx is clear.  Eyes:     General: No scleral icterus.       Right eye: No discharge.        Left eye: No discharge.  Extraocular Movements: Extraocular movements intact.     Conjunctiva/sclera: Conjunctivae normal.     Pupils: Pupils are equal, round, and reactive to light.  Cardiovascular:     Rate and Rhythm: Normal rate and regular rhythm.     Pulses: Normal pulses.     Heart sounds: Normal heart sounds. No murmur heard.    No friction rub. No gallop.  Pulmonary:     Effort: Pulmonary effort is normal. No respiratory distress.     Breath sounds: No stridor. Rhonchi (RLL) present. No wheezing or rales.  Chest:     Chest wall: No tenderness.  Musculoskeletal:        General: Normal range of motion.     Cervical back: Normal range of motion and neck supple.  Skin:    General: Skin is warm and dry.     Capillary Refill: Capillary refill takes less than 2 seconds.     Coloration: Skin is not jaundiced or pale.     Findings: No bruising, erythema, lesion or rash.  Neurological:     General: No focal deficit present.     Mental Status: She is alert and oriented to person, place, and time. Mental status is at baseline.  Psychiatric:        Mood and Affect: Mood normal.        Behavior: Behavior normal.        Thought Content: Thought content normal.        Judgment: Judgment normal.     Results for orders placed or performed during the hospital encounter of 06/22/23  Resp panel by RT-PCR (RSV, Flu A&B, Covid) Anterior Nasal Swab   Specimen: Anterior Nasal Swab   Result Value Ref Range   SARS Coronavirus 2 by RT PCR NEGATIVE NEGATIVE   Influenza A by PCR NEGATIVE NEGATIVE   Influenza B by PCR NEGATIVE NEGATIVE   Resp Syncytial Virus by PCR NEGATIVE NEGATIVE  Respiratory (~20 pathogens) panel by PCR   Specimen: Nasopharyngeal Swab; Respiratory  Result Value Ref Range   Adenovirus NOT DETECTED NOT DETECTED   Coronavirus 229E NOT DETECTED NOT DETECTED   Coronavirus HKU1 NOT DETECTED NOT DETECTED   Coronavirus NL63 NOT DETECTED NOT DETECTED   Coronavirus OC43 NOT DETECTED NOT DETECTED   Metapneumovirus NOT DETECTED NOT DETECTED   Rhinovirus / Enterovirus NOT DETECTED NOT DETECTED   Influenza A NOT DETECTED NOT DETECTED   Influenza B NOT DETECTED NOT DETECTED   Parainfluenza Virus 1 NOT DETECTED NOT DETECTED   Parainfluenza Virus 2 NOT DETECTED NOT DETECTED   Parainfluenza Virus 3 NOT DETECTED NOT DETECTED   Parainfluenza Virus 4 NOT DETECTED NOT DETECTED   Respiratory Syncytial Virus NOT DETECTED NOT DETECTED   Bordetella pertussis NOT DETECTED NOT DETECTED   Bordetella Parapertussis NOT DETECTED NOT DETECTED   Chlamydophila pneumoniae NOT DETECTED NOT DETECTED   Mycoplasma pneumoniae NOT DETECTED NOT DETECTED  CBC with Differential  Result Value Ref Range   WBC 7.9 4.0 - 10.5 K/uL   RBC 4.04 3.87 - 5.11 MIL/uL   Hemoglobin 13.0 12.0 - 15.0 g/dL   HCT 16.1 09.6 - 04.5 %   MCV 99.8 80.0 - 100.0 fL   MCH 32.2 26.0 - 34.0 pg   MCHC 32.3 30.0 - 36.0 g/dL   RDW 40.9 81.1 - 91.4 %   Platelets 193 150 - 400 K/uL   nRBC 0.0 0.0 - 0.2 %   Neutrophils Relative % 55 %   Neutro Abs 4.3 1.7 -  7.7 K/uL   Lymphocytes Relative 20 %   Lymphs Abs 1.6 0.7 - 4.0 K/uL   Monocytes Relative 20 %   Monocytes Absolute 1.6 (H) 0.1 - 1.0 K/uL   Eosinophils Relative 2 %   Eosinophils Absolute 0.2 0.0 - 0.5 K/uL   Basophils Relative 1 %   Basophils Absolute 0.1 0.0 - 0.1 K/uL   Immature Granulocytes 2 %   Abs Immature Granulocytes 0.12 (H) 0.00 - 0.07 K/uL   Comprehensive metabolic panel  Result Value Ref Range   Sodium 135 135 - 145 mmol/L   Potassium 3.8 3.5 - 5.1 mmol/L   Chloride 103 98 - 111 mmol/L   CO2 24 22 - 32 mmol/L   Glucose, Bld 156 (H) 70 - 99 mg/dL   BUN 17 8 - 23 mg/dL   Creatinine, Ser 1.61 0.44 - 1.00 mg/dL   Calcium 8.4 (L) 8.9 - 10.3 mg/dL   Total Protein 7.2 6.5 - 8.1 g/dL   Albumin 3.4 (L) 3.5 - 5.0 g/dL   AST 41 15 - 41 U/L   ALT 28 0 - 44 U/L   Alkaline Phosphatase 65 38 - 126 U/L   Total Bilirubin 0.5 0.3 - 1.2 mg/dL   GFR, Estimated >09 >60 mL/min   Anion gap 8 5 - 15  Troponin I (High Sensitivity)  Result Value Ref Range   Troponin I (High Sensitivity) 7 <18 ng/L  Troponin I (High Sensitivity)  Result Value Ref Range   Troponin I (High Sensitivity) 8 <18 ng/L      Assessment & Plan:   Problem List Items Addressed This Visit   None    Follow up plan: No follow-ups on file.

## 2023-07-07 NOTE — Telephone Encounter (Signed)
OK for verbal orders?

## 2023-07-07 NOTE — Telephone Encounter (Signed)
Called and gave verbal orders per Dr. Wynetta Emery.

## 2023-07-09 ENCOUNTER — Encounter: Payer: Self-pay | Admitting: Family Medicine

## 2023-07-09 MED ORDER — AZITHROMYCIN 250 MG PO TABS: ORAL_TABLET | ORAL | 0 refills | Status: AC

## 2023-07-11 ENCOUNTER — Other Ambulatory Visit: Payer: Self-pay

## 2023-07-11 DIAGNOSIS — S0083XD Contusion of other part of head, subsequent encounter: Secondary | ICD-10-CM | POA: Diagnosis not present

## 2023-07-11 DIAGNOSIS — I10 Essential (primary) hypertension: Secondary | ICD-10-CM | POA: Diagnosis not present

## 2023-07-11 DIAGNOSIS — J209 Acute bronchitis, unspecified: Secondary | ICD-10-CM | POA: Diagnosis not present

## 2023-07-11 DIAGNOSIS — M4726 Other spondylosis with radiculopathy, lumbar region: Secondary | ICD-10-CM | POA: Diagnosis not present

## 2023-07-11 DIAGNOSIS — J189 Pneumonia, unspecified organism: Secondary | ICD-10-CM | POA: Diagnosis not present

## 2023-07-11 DIAGNOSIS — J0100 Acute maxillary sinusitis, unspecified: Secondary | ICD-10-CM | POA: Diagnosis not present

## 2023-07-11 NOTE — Patient Outreach (Signed)
  Care Management  Transitions of Care Program Transitions of Care Post-discharge week 3  07/11/2023 Name: LYTZY COZIER MRN: [id number] DOB: [birth year]  Subjective: VANISSA GAGNON is a 82 y.o. year old female who is a primary care patient of Dorcas Carrow, DO. The Care Management team was unable to reach the patient by phone to assess and address transitions of care needs.   Plan: Additional outreach attempts will be made to reach the patient enrolled in the [facility] Program (Post Inpatient/ED Visit).  Susa Loffler , BSN, RN Care Management Coordinator [place]   [facility] [email or web address] Direct Dial: [phone]

## 2023-07-12 ENCOUNTER — Telehealth: Payer: Self-pay

## 2023-07-12 NOTE — Patient Outreach (Signed)
  Care Management  Transitions of Care Program Transitions of Care Post-discharge week 3  07/12/2023 Name: [name] MRN: [id number] DOB: Jan 04, 1941  Subjective: Cynthia Vaughan is a 82 y.o. year old female who is a primary care patient of Dorcas Carrow, DO. The Care Management team was unable to reach the patient by phone to assess and address transitions of care needs.   Plan: Additional outreach attempts will be made to reach the patient enrolled in the [facility] Program (Post Inpatient/ED Visit).  Susa Loffler , BSN, RN Care Management Coordinator [place]   [facility] [email or web address] Direct Dial: [phone]

## 2023-07-13 DIAGNOSIS — M4726 Other spondylosis with radiculopathy, lumbar region: Secondary | ICD-10-CM | POA: Diagnosis not present

## 2023-07-13 DIAGNOSIS — S0083XD Contusion of other part of head, subsequent encounter: Secondary | ICD-10-CM | POA: Diagnosis not present

## 2023-07-13 DIAGNOSIS — J209 Acute bronchitis, unspecified: Secondary | ICD-10-CM | POA: Diagnosis not present

## 2023-07-13 DIAGNOSIS — J189 Pneumonia, unspecified organism: Secondary | ICD-10-CM | POA: Diagnosis not present

## 2023-07-13 DIAGNOSIS — J0100 Acute maxillary sinusitis, unspecified: Secondary | ICD-10-CM | POA: Diagnosis not present

## 2023-07-13 DIAGNOSIS — I10 Essential (primary) hypertension: Secondary | ICD-10-CM | POA: Diagnosis not present

## 2023-07-17 ENCOUNTER — Telehealth: Payer: Self-pay

## 2023-07-17 NOTE — Patient Instructions (Signed)
Visit Information  Thank you for taking time to visit with me today. Please don't hesitate to contact me if I can be of assistance to you before our next scheduled telephone appointment.  Our next appointment is by telephone on 07/25/23   at 12Noon  Following is a copy of your care plan:   Goals Addressed             This Visit's Progress    TOC Care Plan       Current Barriers:  Care Coordination needs related to ADL IADL limitations, Inability to perform ADL's independently, and Inability to perform IADL's independently  RNCM Clinical Goal(s):  Patient will work with the Care Management team over the next 30 days to address Transition of Care Barriers: Medication Management Support at home Provider appointments Home Health services take all medications exactly as prescribed and will call provider for medication related questions as evidenced by no missed medications no adverse side effects  attend all scheduled medical appointments: PCP post hospital- needs to be scheduled  ,Orthopedics,Dermatology as evidenced by no missed appts  through collaboration with RN Care manager, provider, and care team.   Interventions: Evaluation of current treatment plan related to  self management and patient's adherence to plan as established by provider  Transitions of Care:  Goal on track:  Yes. Doctor Visits  - discussed the importance of doctor visits  Patient Goals/Self-Care Activities: Participate in Transition of Care Program/Attend [facility] scheduled calls Take all medications as prescribed Attend all scheduled provider appointments Call pharmacy for medication refills 3-7 days in advance of running out of medications Call provider office for new concerns or questions   Follow Up Plan:  Telephone follow up appointment with care management team member scheduled for:  07/25/23 @ 12 N  The patient has been provided with contact information for the care management team and has been advised to  call with any health related questions or concerns.          Patient verbalizes understanding of instructions and care plan provided today and agrees to view in MyChart. Active MyChart status and patient understanding of how to access instructions and care plan via MyChart confirmed with patient.     The patient has been provided with contact information for the care management team and has been advised to call with any health related questions or concerns.   Please call the care guide team at [phone] if you need to cancel or reschedule your appointment.   Please call the [place] National Suicide Prevention Lifeline: 717-631-8422 or TTY: 409-091-5428 TTY (415)699-7538) to talk to a trained counselor go to [facility] [address], [place] [phone]) if you are experiencing a Mental Health or Behavioral Health Crisis or need someone to talk to.  Susa Loffler , BSN, RN Care Management Coordinator [place]   [facility] [email or web address] Direct Dial: [phone]

## 2023-07-17 NOTE — Patient Outreach (Signed)
Care Management  Transitions of Care Program Transitions of Care Post-discharge week 3   07/17/2023 Name: Cynthia Vaughan MRN: [id number] DOB: [birth year]  Subjective: Cynthia Vaughan is a 82 y.o. year old female who is a primary care patient of Dorcas Carrow, DO. The Care Management team Engaged with patient Engaged with patient by telephone to assess and address transitions of care needs.   Consent to Services:  Patient was given information about care management services, agreed to services, and gave verbal consent to participate.   Assessment:   Patient voices no new complaints Patient has not developed/ reported any new Medical issues / Dx or acute changes.- since last follow-up call for most recent  Hospital stay   10/31-11/3 / 2024  Spike with patients spouse. She was seen by PCP 11/15 + CXR Started on -pack, now completed, She is doing much better with no s/s LRTI. She continues to be followed by [facility] has completed visits and signed off PT/OT remain in with patient. There have been no additional medication changes  Patient educated on red flag s/s to watch for and was encouraged to report, any changes in baseline or  medication regimen,  changes in health status  /  well-being, safety concerns  or any new unmanaged side effects or symptoms not relieved with interventions  to PCP and / or the  [facility] Case Management team          SDOH Interventions    Flowsheet Row Office Visit from 07/07/2023 in [facility] Telephone from 06/26/2023 in [place] POPULATION HEALTH DEPARTMENT Office Visit from 06/13/2023 in [facility] Clinical Support from 06/20/2022 in [facility] Office Visit from 12/30/2021 in [facility] Office Visit from 09/15/2020 in [facility]  SDOH Interventions        Food Insecurity Interventions -- Intervention Not  Indicated -- -- -- --  Housing Interventions -- Intervention Not Indicated -- Intervention Not Indicated -- --  Transportation Interventions -- Intervention Not Indicated, Patient Resources (Friends/Family) -- Intervention Not Indicated -- --  Utilities Interventions -- Intervention Not Indicated -- Intervention Not Indicated -- --  Alcohol Usage Interventions Intervention Not Indicated (Score <7) -- -- Intervention Not Indicated (Score <7) -- --  Depression Interventions/Treatment  -- -- Medication -- Medication Currently on Treatment  Financial Strain Interventions -- -- -- Intervention Not Indicated -- --  Physical Activity Interventions -- -- -- Intervention Not Indicated -- --  Stress Interventions -- -- -- Intervention Not Indicated -- --  Social Connections Interventions -- -- -- Intervention Not Indicated -- --  Health Literacy Interventions Intervention Not Indicated -- -- -- -- --        Goals Addressed             This Visit's Progress    TOC Care Plan       Current Barriers:  Care Coordination needs related to ADL IADL limitations, Inability to perform ADL's independently, and Inability to perform IADL's independently  RNCM Clinical Goal(s):  Patient will work with the Care Management team over the next 30 days to address Transition of Care Barriers: Medication Management Support at home Provider appointments Home Health services take all medications exactly as prescribed and will call provider for medication related questions as evidenced by no missed medications no adverse side effects  attend all scheduled medical appointments: PCP post hospital- needs to be scheduled  ,  Orthopedics,Dermatology as evidenced by no missed appts  through collaboration with Medical illustrator, provider, and care team.   Interventions: Evaluation of current treatment plan related to  self management and patient's adherence to plan as established by provider  Transitions of Care:  Goal on  track:  Yes. Doctor Visits  - discussed the importance of doctor visits  Patient Goals/Self-Care Activities: Participate in Transition of Care Program/Attend [facility] scheduled calls Take all medications as prescribed Attend all scheduled provider appointments Call pharmacy for medication refills 3-7 days in advance of running out of medications Call provider office for new concerns or questions   Follow Up Plan:  Telephone follow up appointment with care management team member scheduled for:  07/25/23 @ 12 N  The patient has been provided with contact information for the care management team and has been advised to call with any health related questions or concerns.          Plan:  Routine follow-up and on-going assessment evaluation and education of disease processes, recommended interventions for both chronic and acute medical conditions , will occur during each weekly visit along with ongoing review of symptoms ,medication reviews and reconciliation. Any updates , inconsistencies, discrepancies or acute care concerns will be addressed and routed to the correct Practitioner if indicated   Based on current information and Insurance plan -Reviewed benefits available to patient, including details about eligibility options for care if any area of needs were identified with spouse   Reviewed patients ability to access and / or navigating the benefits system..Amb Referral made if indicted , refer to orders section of note for details   Please refer to Care Plan for goals and interventions -Effectiveness of interventions, symptom management and outcomes will be evaluated  weekly during [facility] 30-day Program Outreach calls  . Any necessary  changes and updates to Care Plan will be completed episodically    Reviewed goals for care Patient verbalizes understanding of instructions and care plan provided. Patient was encouraged to make informed decisions about their care, actively participate in managing  their health condition, and implement lifestyle changes as needed to promote independence and self-management of health care  The patient has been provided with contact information for the care management team and has been advised to call with any health-related questions or concerns.   The patient has been provided with contact information for the care management team and has been advised to call with any health-related questions or concerns.   The patient has been provided with contact information for the care management team and has been advised to call with any health related questions or concerns.   Susa Loffler , BSN, RN Care Management Coordinator [place]   [facility] [email or web address] Direct Dial: [phone]

## 2023-07-19 DIAGNOSIS — S0083XD Contusion of other part of head, subsequent encounter: Secondary | ICD-10-CM | POA: Diagnosis not present

## 2023-07-19 DIAGNOSIS — I10 Essential (primary) hypertension: Secondary | ICD-10-CM | POA: Diagnosis not present

## 2023-07-19 DIAGNOSIS — M4726 Other spondylosis with radiculopathy, lumbar region: Secondary | ICD-10-CM | POA: Diagnosis not present

## 2023-07-19 DIAGNOSIS — J189 Pneumonia, unspecified organism: Secondary | ICD-10-CM | POA: Diagnosis not present

## 2023-07-19 DIAGNOSIS — J0100 Acute maxillary sinusitis, unspecified: Secondary | ICD-10-CM | POA: Diagnosis not present

## 2023-07-19 DIAGNOSIS — J209 Acute bronchitis, unspecified: Secondary | ICD-10-CM | POA: Diagnosis not present

## 2023-07-25 ENCOUNTER — Ambulatory Visit: Payer: Medicare Other | Admitting: Dermatology

## 2023-07-25 ENCOUNTER — Encounter: Payer: Self-pay | Admitting: Dermatology

## 2023-07-25 ENCOUNTER — Other Ambulatory Visit: Payer: Self-pay

## 2023-07-25 DIAGNOSIS — D3610 Benign neoplasm of peripheral nerves and autonomic nervous system, unspecified: Secondary | ICD-10-CM

## 2023-07-25 DIAGNOSIS — W908XXA Exposure to other nonionizing radiation, initial encounter: Secondary | ICD-10-CM

## 2023-07-25 DIAGNOSIS — L578 Other skin changes due to chronic exposure to nonionizing radiation: Secondary | ICD-10-CM

## 2023-07-25 DIAGNOSIS — L821 Other seborrheic keratosis: Secondary | ICD-10-CM | POA: Diagnosis not present

## 2023-07-25 DIAGNOSIS — Z85828 Personal history of other malignant neoplasm of skin: Secondary | ICD-10-CM

## 2023-07-25 DIAGNOSIS — Z1283 Encounter for screening for malignant neoplasm of skin: Secondary | ICD-10-CM

## 2023-07-25 DIAGNOSIS — L853 Xerosis cutis: Secondary | ICD-10-CM | POA: Diagnosis not present

## 2023-07-25 DIAGNOSIS — L820 Inflamed seborrheic keratosis: Secondary | ICD-10-CM | POA: Diagnosis not present

## 2023-07-25 DIAGNOSIS — D3612 Benign neoplasm of peripheral nerves and autonomic nervous system, upper limb, including shoulder: Secondary | ICD-10-CM

## 2023-07-25 DIAGNOSIS — D229 Melanocytic nevi, unspecified: Secondary | ICD-10-CM

## 2023-07-25 DIAGNOSIS — D1801 Hemangioma of skin and subcutaneous tissue: Secondary | ICD-10-CM

## 2023-07-25 DIAGNOSIS — Z86018 Personal history of other benign neoplasm: Secondary | ICD-10-CM

## 2023-07-25 DIAGNOSIS — L814 Other melanin hyperpigmentation: Secondary | ICD-10-CM

## 2023-07-25 NOTE — Progress Notes (Signed)
Follow-Up Visit   Subjective  Cynthia Vaughan is a 82 y.o. female who presents for the following: Skin Cancer Screening and Full Body Skin Exam  The patient presents for Total-Body Skin Exam (TBSE) for skin cancer screening and mole check. The patient has spots, moles and lesions to be evaluated, some may be new or changing and the patient may have concern these could be cancer.  Hx BCC, Atypical Nevi. Patient accompanied by husband who contributes to history.  The following portions of the chart were reviewed this encounter and updated as appropriate: medications, allergies, medical history  Review of Systems:  No other skin or systemic complaints except as noted in HPI or Assessment and Plan.  Objective  Well appearing patient in no apparent distress; mood and affect are within normal limits.  A full examination was performed including scalp, head, eyes, ears, nose, lips, neck, chest, axillae, abdomen, back, buttocks, bilateral upper extremities, bilateral lower extremities, hands, feet, fingers, toes, fingernails, and toenails. All findings within normal limits unless otherwise noted below.   Relevant physical exam findings are noted in the Assessment and Plan.  left medial shoulder Soft flesh papule  L upper arm x 1 Erythematous stuck-on, waxy plaque    Assessment & Plan   SKIN CANCER SCREENING PERFORMED TODAY.  ACTINIC DAMAGE - Chronic condition, secondary to cumulative UV/sun exposure - diffuse scaly erythematous macules with underlying dyspigmentation - Recommend daily broad spectrum sunscreen SPF 30+ to sun-exposed areas, reapply every 2 hours as needed.  - Staying in the shade or wearing long sleeves, sun glasses (UVA+UVB protection) and wide brim hats (4-inch brim around the entire circumference of the hat) are also recommended for sun protection.  - Call for new or changing lesions.  LENTIGINES, SEBORRHEIC KERATOSES, HEMANGIOMAS - Benign normal skin lesions -  Benign-appearing - Call for any changes  MELANOCYTIC NEVI - Tan-brown and/or pink-flesh-colored symmetric macules and papules - Benign appearing on exam today - Observation - Call clinic for new or changing moles - Recommend daily use of broad spectrum spf 30+ sunscreen to sun-exposed areas.   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  - Call if any new or changing lesions are noted between office visits  - R lower elbow   History of Basal Cell Carcinoma of the Skin - No evidence of recurrence today- R infra orbital, R lat infraorbital - Recommend regular full body skin exams - Recommend daily broad spectrum sunscreen SPF 30+ to sun-exposed areas, reapply every 2 hours as needed.  - Call if any new or changing lesions are noted between office visits    Neurofibroma left medial shoulder  Benign, observe.     Inflamed seborrheic keratosis L upper arm x 1  Symptomatic, irritating, patient would like treated.  Recheck on follow up.   Destruction of lesion - L upper arm x 1  Destruction method: cryotherapy   Informed consent: discussed and consent obtained   Lesion destroyed using liquid nitrogen: Yes   Region frozen until ice ball extended beyond lesion: Yes   Outcome: patient tolerated procedure well with no complications   Post-procedure details: wound care instructions given   Additional details:  Prior to procedure, discussed risks of blister formation, small wound, skin dyspigmentation, or rare scar following cryotherapy. Recommend Vaseline ointment to treated  areas while healing.   Xerosis - diffuse xerotic patches - recommend gentle, hydrating skin care - gentle skin care handout given, Eucerin samples given  Return in about 1 year (around  07/24/2024) for TBSE, with Dr. Roseanne Reno, Hx BCC, Hx Atypical Nevi, ISK follow up.  Anise Salvo, RMA, am acting as scribe for Willeen Niece, MD .   Documentation: I have reviewed the above documentation for accuracy and completeness, and I agree with the above.  Willeen Niece, MD

## 2023-07-25 NOTE — Patient Instructions (Addendum)
Cryotherapy Aftercare  Wash gently with soap and water everyday.   Apply Vaseline and Band-Aid daily until healed.    Gentle Skin Care Guide  1. Bathe no more than once a day.  2. Avoid bathing in hot water  3. Use a mild soap like Dove, Vanicream, Cetaphil, CeraVe. Can use Lever 2000 or Cetaphil antibacterial soap  4. Use soap only where you need it. On most days, use it under your arms, between your legs, and on your feet. Let the water rinse other areas unless visibly dirty.  5. When you get out of the bath/shower, use a towel to gently blot your skin dry, don't rub it.  6. While your skin is still a little damp, apply a moisturizing cream such as Vanicream, CeraVe, Cetaphil, Eucerin, Sarna lotion or plain Vaseline Jelly. For hands apply Neutrogena [place] Hand Cream or Excipial Hand Cream.  7. Reapply moisturizer any time you start to itch or feel dry.  8. Sometimes using free and clear laundry detergents can be helpful. Fabric softener sheets should be avoided. Downy Free & Gentle liquid, or any liquid fabric softener that is free of dyes and perfumes, it acceptable to use  9. If your doctor has given you prescription creams you may apply moisturizers over them     Melanoma ABCDEs  Melanoma is the most dangerous type of skin cancer, and is the leading cause of death from skin disease.  You are more likely to develop melanoma if you: Have light-colored skin, light-colored eyes, or red or blond hair Spend a lot of time in the sun Tan regularly, either outdoors or in a tanning bed Have had blistering sunburns, especially during childhood Have a close family member who has had a melanoma Have atypical moles or large birthmarks  Early detection of melanoma is key since treatment is typically straightforward and cure rates are extremely high if we catch it early.   The first sign of melanoma is often a change in a mole or a new dark spot.  The ABCDE system is a way of  remembering the signs of melanoma.  A for asymmetry:  The two halves do not match. B for border:  The edges of the growth are irregular. C for color:  A mixture of colors are present instead of an even brown color. D for diameter:  Melanomas are usually (but not always) greater than 6mm - the size of a pencil eraser. E for evolution:  The spot keeps changing in size, shape, and color.  Please check your skin once per month between visits. You can use a small mirror in front and a large mirror behind you to keep an eye on the back side or your body.   If you see any new or changing lesions before your next follow-up, please call to schedule a visit.  Please continue daily skin protection including broad spectrum sunscreen SPF 30+ to sun-exposed areas, reapplying every 2 hours as needed when you're outdoors.    Due to recent changes in healthcare laws, you may see results of your pathology and/or laboratory studies on MyChart before the doctors have had a chance to review them. We understand that in some cases there may be results that are confusing or concerning to you. Please understand that not all results are received at the same time and often the doctors may need to interpret multiple results in order to provide you with the best plan of care or course of treatment. Therefore, we  ask that you please give [place] 2 business days to thoroughly review all your results before contacting the office for clarification. Should we see a critical lab result, you will be contacted sooner.   If You Need Anything After Your Visit  If you have any questions or concerns for your doctor, please call our main line at [phone] and press option 4 to reach your doctor's medical assistant. If no one answers, please leave a voicemail as directed and we will return your call as soon as possible. Messages left after 4 pm will be answered the following business day.   You may also send [place] a message via MyChart. We  typically respond to MyChart messages within 1-2 business days.  For prescription refills, please ask your pharmacy to contact our office. Our fax number is [phone].  If you have an urgent issue when the clinic is closed that cannot wait until the next business day, you can page your doctor at the number below.    Please note that while we do our best to be available for urgent issues outside of office hours, we are not available 24/7.   If you have an urgent issue and are unable to reach [place], you may choose to seek medical care at your doctor's office, retail clinic, urgent care center, or emergency room.  If you have a medical emergency, please immediately call 911 or go to the emergency department.  Pager Numbers  - Dr. Gwen Pounds: [phone]  - Dr. Roseanne Reno: [phone]  - Dr. Katrinka Blazing: [phone]   In the event of inclement weather, please call our main line at [phone] for an update on the status of any delays or closures.  Dermatology Medication Tips: Please keep the boxes that topical medications come in in order to help keep track of the instructions about where and how to use these. Pharmacies typically print the medication instructions only on the boxes and not directly on the medication tubes.   If your medication is too expensive, please contact our office at [phone] option 4 or send [place] a message through MyChart.   We are unable to tell what your co-pay for medications will be in advance as this is different depending on your insurance coverage. However, we may be able to find a substitute medication at lower cost or fill out paperwork to get insurance to cover a needed medication.   If a prior authorization is required to get your medication covered by your insurance company, please allow [place] 1-2 business days to complete this process.  Drug prices often vary depending on where the prescription is filled and some pharmacies may offer cheaper prices.  The  website www.goodrx.com contains coupons for medications through different pharmacies. The prices here do not account for what the cost may be with help from insurance (it may be cheaper with your insurance), but the website can give you the price if you did not use any insurance.  - You can print the associated coupon and take it with your prescription to the pharmacy.  - You may also stop by our office during regular business hours and pick up a GoodRx coupon card.  - If you need your prescription sent electronically to a different pharmacy, notify our office through [facility] or by phone at [phone] option 4.     Si Usted Necesita Algo Despus de Su Visita  Tambin puede enviarnos un mensaje a travs de Clinical cytogeneticist. Por lo general respondemos a los mensajes de [employer]  en el transcurso de 1 a 2 das hbiles.  Para renovar recetas, por favor pida a su farmacia que se ponga en contacto con nuestra oficina. Annie Sable de fax es [place] [phone].  Si tiene un asunto urgente cuando la clnica est cerrada y que no puede esperar hasta el siguiente da hbil, puede llamar/localizar a su doctor(a) al nmero que aparece a continuacin.   Por favor, tenga en cuenta que aunque hacemos todo lo posible para estar disponibles para asuntos urgentes fuera del horario de [place], no estamos disponibles las 24 horas del da, los 7 [address]  [address] de la [place].   Si tiene un problema urgente y no puede comunicarse con nosotros, puede optar por buscar atencin mdica  en el consultorio de su doctor(a), en una clnica privada, en un centro de atencin urgente o en una sala de emergencias.  Si tiene Engineer, drilling, por favor llame inmediatamente al 911 o vaya a la sala de emergencias.  Nmeros de bper  - Dr. Gwen Pounds: [phone]  - Dra. Roseanne Reno: [phone]  - Dr. Katrinka Blazing: [phone]   En caso de inclemencias del tiempo, por favor llame a Lacy Duverney principal al [phone] para una  actualizacin sobre el [place] de cualquier retraso o cierre.  Consejos para la medicacin en dermatologa: Por favor, guarde las cajas en las que vienen los medicamentos de uso tpico para ayudarle a seguir las instrucciones sobre dnde y cmo usarlos. Las farmacias generalmente imprimen las instrucciones del medicamento slo en las cajas y no directamente en los tubos del [place].   Si su medicamento es muy caro, por favor, pngase en contacto con Rolm Gala llamando al [phone] y presione la opcin 4 o envenos un mensaje a travs de Clinical cytogeneticist.   No podemos decirle cul ser su copago por los medicamentos por adelantado ya que esto es diferente dependiendo de la cobertura de su seguro. Sin embargo, es posible que podamos encontrar un medicamento sustituto a Audiological scientist un formulario para que el seguro cubra el medicamento que se considera necesario.   Si se requiere una autorizacin previa para que su compaa de seguros [place] su medicamento, por favor permtanos de 1 a 2 das hbiles para completar [address]  Los precios de los medicamentos varan con frecuencia dependiendo del Environmental consultant de dnde se surte la receta y alguna farmacias pueden ofrecer precios ms baratos.  El sitio web www.goodrx.com tiene cupones para medicamentos de Health and safety inspector. Los precios aqu no tienen en cuenta lo que podra costar con la ayuda del seguro (puede ser ms barato con su seguro), pero el sitio web puede darle el precio si no utiliz Tourist information centre manager.  - Puede imprimir el cupn correspondiente y llevarlo con su receta a la farmacia.  - Tambin puede pasar por nuestra oficina durante el horario de atencin regular y Education officer, museum una tarjeta de cupones de GoodRx.  - Si necesita que su receta se enve electrnicamente a una farmacia diferente, informe a nuestra oficina a travs de MyChart de [place] o por telfono llamando al [phone] y presione la opcin 4.

## 2023-07-25 NOTE — Patient Outreach (Signed)
  Care Management  Transitions of Care Program Transitions of Care Post-discharge week 4  07/25/2023 Name: ALEXICIA CELLINI MRN: [id number] DOB: July 20, 1941  Subjective: SADIEMAE BARTLES is a 81 y.o. year old female who is a primary care patient of Dorcas Carrow, DO. The Care Management team was unable to reach the patient by phone to assess and address transitions of care needs.   Plan: Additional outreach attempts will be made to reach the patient enrolled in the [facility] Program (Post Inpatient/ED Visit).  Susa Loffler , BSN, RN Care Management Coordinator [place]   [facility] [email or web address] Direct Dial: [phone]

## 2023-07-26 ENCOUNTER — Telehealth: Payer: Self-pay

## 2023-07-26 NOTE — Patient Outreach (Signed)
Care Management  Transitions of Care Program Transitions of Care Post-discharge week 4-Program Completion    07/26/2023 Name: Cynthia Vaughan MRN: [id number] DOB: 1940-08-31  Subjective: Cynthia Vaughan is a 82 y.o. year old female who is a primary care patient of Dorcas Carrow, DO. The Care Management team  was unable to Engage with patient to assess and address transitions of care needs.     Assessment:     Attempted to reach patient x 2 . This CM will not be available for additional follow-up attempts until 12/10/ Patient is followed by [facility] Nursing and with this oversight and HH working with PCP will defer to [facility] for disease and medication management       SDOH Interventions    Flowsheet Row Office Visit from 07/07/2023 in [facility] Telephone from 06/26/2023 in [place] POP[facility]ATION HEALTH DEPARTMENT Office Visit from 06/13/2023 in [facility] Clinical Support from 06/20/2022 in [facility] Office Visit from 12/30/2021 in [facility] Office Visit from 09/15/2020 in [facility]  SDOH Interventions        Food Insecurity Interventions -- Intervention Not Indicated -- -- -- --  Housing Interventions -- Intervention Not Indicated -- Intervention Not Indicated -- --  Transportation Interventions -- Intervention Not Indicated, Patient Resources (Friends/Family) -- Intervention Not Indicated -- --  Utilities Interventions -- Intervention Not Indicated -- Intervention Not Indicated -- --  Alcohol Usage Interventions Intervention Not Indicated (Score <7) -- -- Intervention Not Indicated (Score <7) -- --  Depression Interventions/Treatment  -- -- Medication -- Medication Currently on Treatment  Financial Strain Interventions -- -- -- Intervention Not Indicated -- --  Physical Activity Interventions -- -- -- Intervention Not Indicated -- --  Stress  Interventions -- -- -- Intervention Not Indicated -- --  Social Connections Interventions -- -- -- Intervention Not Indicated -- --  Health Literacy Interventions Intervention Not Indicated -- -- -- -- --        Goals Addressed             This Visit's Progress    COMPLETED: TOC Care Plan       Current Barriers:  Care Coordination needs related to ADL IADL limitations, Inability to perform ADL's independently, and Inability to perform IADL's independently  RNCM Clinical Goal(s):  Patient will work with the Care Management team over the next 30 days to address Transition of Care Barriers: Medication Management Support at home Provider appointments Home Health services take all medications exactly as prescribed and will call provider for medication related questions as evidenced by no missed medications no adverse side effects  attend all scheduled medical appointments: PCP post hospital- needs to be scheduled  ,Orthopedics,Dermatology- completed 12/3  as evidenced by no missed appts  through collaboration with RN Care manager, provider, and care team.   Interventions: Evaluation of current treatment plan related to  self management and patient's adherence to plan as established by provider  Transitions of Care:   Patient @ final call this week-unable to contact patient   Doctor Visits  - discussed the importance of doctor visits  Patient Goals/Self-Care Activities: Participate in Transition of Care Program/Attend [facility] scheduled calls Take all medications as prescribed Attend all scheduled provider appointments Call pharmacy for medication refills 3-7 days in advance of running out of medications Call provider office for new concerns or questions   Follow Up Plan:  Telephone  follow up appointment with care management team member scheduled for:  07/25/23 @ 12 N - missed call 12/4 . Will close [facility] Program due to Provider unavailability thru 12/10 and patient has been stable is  fallowed by [facility]  The patient has been provided with contact information for the care management team and has been advised to call with any health related questions or concerns.          Plan:  The patient has  completed the 30-day TOC Program. Last call was this week CM has been unable to reach the patient . She is followed by [facility] Nursing and therapy services.  Chronic conditions and ongoing care is  managed thru collaboration with  [facility]  PCP,  Specialists and additional Healthcare Providers if indicated . Patient verbalized understanding of ongoing plan of care.  SDOH needs have been screened and interventions provided if identified.   Previously reviewed  home medications -- provided education as needed. discussed rationale of use, how/when to take medications. Patient / Caregiver is aware of potential side effects, and was encouraged to notify PCP for any changes in condition or signs / symptoms not relieved  with interventions.   In previous conversations Patient/ Caregiver are aware to  call 911 for Medical Emergencies or Life -Threatening or report to a local emergency department or urgent care.   Patient Metta Clines know to Contact PCP  with any questions or concerns regarding ongoing  medical care, any  difficulty obtaining or picking up  prescriptions, any  changes or  worsening in  condition including signs / symptoms not relieved  with interventions   The patient has been provided with contact information for the care management team and has been advised to call with any health related questions or concerns.   Susa Loffler , BSN, RN Care Management Coordinator [place]   [facility] [email or web address] Direct Dial: [phone]

## 2023-07-27 DIAGNOSIS — M4726 Other spondylosis with radiculopathy, lumbar region: Secondary | ICD-10-CM | POA: Diagnosis not present

## 2023-07-27 DIAGNOSIS — H109 Unspecified conjunctivitis: Secondary | ICD-10-CM | POA: Diagnosis not present

## 2023-07-27 DIAGNOSIS — F329 Major depressive disorder, single episode, unspecified: Secondary | ICD-10-CM | POA: Diagnosis not present

## 2023-07-27 DIAGNOSIS — F0393 Unspecified dementia, unspecified severity, with mood disturbance: Secondary | ICD-10-CM | POA: Diagnosis not present

## 2023-07-27 DIAGNOSIS — J209 Acute bronchitis, unspecified: Secondary | ICD-10-CM | POA: Diagnosis not present

## 2023-07-27 DIAGNOSIS — J189 Pneumonia, unspecified organism: Secondary | ICD-10-CM | POA: Diagnosis not present

## 2023-07-27 DIAGNOSIS — S0083XD Contusion of other part of head, subsequent encounter: Secondary | ICD-10-CM | POA: Diagnosis not present

## 2023-07-27 DIAGNOSIS — Z9181 History of falling: Secondary | ICD-10-CM | POA: Diagnosis not present

## 2023-07-27 DIAGNOSIS — E785 Hyperlipidemia, unspecified: Secondary | ICD-10-CM | POA: Diagnosis not present

## 2023-07-27 DIAGNOSIS — J0100 Acute maxillary sinusitis, unspecified: Secondary | ICD-10-CM | POA: Diagnosis not present

## 2023-07-27 DIAGNOSIS — I10 Essential (primary) hypertension: Secondary | ICD-10-CM | POA: Diagnosis not present

## 2023-07-27 DIAGNOSIS — F0394 Unspecified dementia, unspecified severity, with anxiety: Secondary | ICD-10-CM | POA: Diagnosis not present

## 2023-07-28 ENCOUNTER — Encounter: Payer: Self-pay | Admitting: Family Medicine

## 2023-07-28 ENCOUNTER — Ambulatory Visit (INDEPENDENT_AMBULATORY_CARE_PROVIDER_SITE_OTHER): Payer: Medicare Other | Admitting: Family Medicine

## 2023-07-28 VITALS — BP 132/72 | HR 120 | Ht 61.0 in | Wt 179.2 lb

## 2023-07-28 DIAGNOSIS — M4726 Other spondylosis with radiculopathy, lumbar region: Secondary | ICD-10-CM | POA: Diagnosis not present

## 2023-07-28 DIAGNOSIS — J189 Pneumonia, unspecified organism: Secondary | ICD-10-CM | POA: Diagnosis not present

## 2023-07-28 DIAGNOSIS — J209 Acute bronchitis, unspecified: Secondary | ICD-10-CM | POA: Diagnosis not present

## 2023-07-28 DIAGNOSIS — J0100 Acute maxillary sinusitis, unspecified: Secondary | ICD-10-CM | POA: Diagnosis not present

## 2023-07-28 DIAGNOSIS — I10 Essential (primary) hypertension: Secondary | ICD-10-CM | POA: Diagnosis not present

## 2023-07-28 DIAGNOSIS — S0083XD Contusion of other part of head, subsequent encounter: Secondary | ICD-10-CM | POA: Diagnosis not present

## 2023-07-28 NOTE — Progress Notes (Signed)
BP 132/72   Pulse (!) 120   Ht 5\' 1"  (1.549 m)   Wt 179 lb 3.2 oz (81.3 kg)   LMP  (LMP Unknown)   SpO2 95%   BMI 33.86 kg/m    Subjective:    Patient ID: Cynthia Vaughan, female    DOB: Dec 26, 1940, 82 y.o.   MRN: [id number]  HPI: Cynthia Vaughan is a 82 y.o. female  Chief Complaint  Patient presents with   Pneumonia    Patient husband says the patient is doing better. He says it seems the patient's cough is gone.    Feeling much better. No fevers. No chills. No coughing. Has finished her antibiotics. No other concerns or complaints at this time.   Relevant past medical, surgical, family and social history reviewed and updated as indicated. Interim medical history since our last visit reviewed. Allergies and medications reviewed and updated.  Review of Systems  Constitutional: Negative.   Respiratory: Negative.    Cardiovascular: Negative.   Gastrointestinal: Negative.   Musculoskeletal: Negative.   Psychiatric/Behavioral: Negative.      Per HPI unless specifically indicated above     Objective:    BP 132/72   Pulse (!) 120   Ht 5\' 1"  (1.549 m)   Wt 179 lb 3.2 oz (81.3 kg)   LMP  (LMP Unknown)   SpO2 95%   BMI 33.86 kg/m   Wt Readings from Last 3 Encounters:  07/28/23 179 lb 3.2 oz (81.3 kg)  07/07/23 179 lb 3.2 oz (81.3 kg)  06/22/23 174 lb 2.6 oz (79 kg)    Physical Exam Vitals and nursing note reviewed.  Constitutional:      General: She is not in acute distress.    Appearance: Normal appearance. She is obese. She is not ill-appearing, toxic-appearing or diaphoretic.  HENT:     Head: Normocephalic and atraumatic.     Right Ear: External ear normal.     Left Ear: External ear normal.     Nose: Nose normal.     Mouth/Throat:     Mouth: Mucous membranes are moist.     Pharynx: Oropharynx is clear.  Eyes:     General: No scleral icterus.       Right eye: No discharge.        Left eye: No discharge.     Extraocular Movements: Extraocular  movements intact.     Conjunctiva/sclera: Conjunctivae normal.     Pupils: Pupils are equal, round, and reactive to light.  Cardiovascular:     Rate and Rhythm: Normal rate and regular rhythm.     Pulses: Normal pulses.     Heart sounds: Normal heart sounds. No murmur heard.    No friction rub. No gallop.  Pulmonary:     Effort: Pulmonary effort is normal. No respiratory distress.     Breath sounds: Normal breath sounds. No stridor. No wheezing, rhonchi or rales.  Chest:     Chest wall: No tenderness.  Musculoskeletal:        General: Normal range of motion.     Cervical back: Normal range of motion and neck supple.  Skin:    General: Skin is warm and dry.     Capillary Refill: Capillary refill takes less than 2 seconds.     Coloration: Skin is not jaundiced or pale.     Findings: No bruising, erythema, lesion or rash.  Neurological:     General: No focal deficit present.  Mental Status: She is alert and oriented to person, place, and time. Mental status is at baseline.  Psychiatric:        Mood and Affect: Mood normal.        Behavior: Behavior normal.        Thought Content: Thought content normal.        Judgment: Judgment normal.     Results for orders placed or performed during the hospital encounter of 06/22/23  Resp panel by RT-PCR (RSV, Flu A&B, Covid) Anterior Nasal Swab   Specimen: Anterior Nasal Swab  Result Value Ref Range   SARS Coronavirus 2 by RT PCR NEGATIVE NEGATIVE   Influenza A by PCR NEGATIVE NEGATIVE   Influenza B by PCR NEGATIVE NEGATIVE   Resp Syncytial Virus by PCR NEGATIVE NEGATIVE  Respiratory (~20 pathogens) panel by PCR   Specimen: Nasopharyngeal Swab; Respiratory  Result Value Ref Range   Adenovirus NOT DETECTED NOT DETECTED   Coronavirus 229E NOT DETECTED NOT DETECTED   Coronavirus HKU1 NOT DETECTED NOT DETECTED   Coronavirus NL63 NOT DETECTED NOT DETECTED   Coronavirus OC43 NOT DETECTED NOT DETECTED   Metapneumovirus NOT DETECTED NOT  DETECTED   Rhinovirus / Enterovirus NOT DETECTED NOT DETECTED   Influenza A NOT DETECTED NOT DETECTED   Influenza B NOT DETECTED NOT DETECTED   Parainfluenza Virus 1 NOT DETECTED NOT DETECTED   Parainfluenza Virus 2 NOT DETECTED NOT DETECTED   Parainfluenza Virus 3 NOT DETECTED NOT DETECTED   Parainfluenza Virus 4 NOT DETECTED NOT DETECTED   Respiratory Syncytial Virus NOT DETECTED NOT DETECTED   Bordetella pertussis NOT DETECTED NOT DETECTED   Bordetella Parapertussis NOT DETECTED NOT DETECTED   Chlamydophila pneumoniae NOT DETECTED NOT DETECTED   Mycoplasma pneumoniae NOT DETECTED NOT DETECTED  CBC with Differential  Result Value Ref Range   WBC 7.9 4.0 - 10.5 K/uL   RBC 4.04 3.87 - 5.11 MIL/uL   Hemoglobin 13.0 12.0 - 15.0 g/dL   HCT 78.2 95.6 - 21.3 %   MCV 99.8 80.0 - 100.0 fL   MCH 32.2 26.0 - 34.0 pg   MCHC 32.3 30.0 - 36.0 g/dL   RDW 08.6 57.8 - 46.9 %   Platelets 193 150 - 400 K/uL   nRBC 0.0 0.0 - 0.2 %   Neutrophils Relative % 55 %   Neutro Abs 4.3 1.7 - 7.7 K/uL   Lymphocytes Relative 20 %   Lymphs Abs 1.6 0.7 - 4.0 K/uL   Monocytes Relative 20 %   Monocytes Absolute 1.6 (H) 0.1 - 1.0 K/uL   Eosinophils Relative 2 %   Eosinophils Absolute 0.2 0.0 - 0.5 K/uL   Basophils Relative 1 %   Basophils Absolute 0.1 0.0 - 0.1 K/uL   Immature Granulocytes 2 %   Abs Immature Granulocytes 0.12 (H) 0.00 - 0.07 K/uL  Comprehensive metabolic panel  Result Value Ref Range   Sodium 135 135 - 145 mmol/L   Potassium 3.8 3.5 - 5.1 mmol/L   Chloride 103 98 - 111 mmol/L   CO2 24 22 - 32 mmol/L   Glucose, Bld 156 (H) 70 - 99 mg/dL   BUN 17 8 - 23 mg/dL   Creatinine, Ser 6.29 0.44 - 1.00 mg/dL   Calcium 8.4 (L) 8.9 - 10.3 mg/dL   Total Protein 7.2 6.5 - 8.1 g/dL   Albumin 3.4 (L) 3.5 - 5.0 g/dL   AST 41 15 - 41 U/L   ALT 28 0 - 44 U/L  Alkaline Phosphatase 65 38 - 126 U/L   Total Bilirubin 0.5 0.3 - 1.2 mg/dL   GFR, Estimated >11 >91 mL/min   Anion gap 8 5 - 15  Troponin I  (High Sensitivity)  Result Value Ref Range   Troponin I (High Sensitivity) 7 <18 ng/L  Troponin I (High Sensitivity)  Result Value Ref Range   Troponin I (High Sensitivity) 8 <18 ng/L      Assessment & Plan:   Problem List Items Addressed This Visit   None Visit Diagnoses     Atypical pneumonia    -  Primary   Resolved. Lungs clear. Call with any cocnerns. Continue to monitor.        Follow up plan: Return for As scheduled.

## 2023-07-31 DIAGNOSIS — M5416 Radiculopathy, lumbar region: Secondary | ICD-10-CM | POA: Diagnosis not present

## 2023-07-31 DIAGNOSIS — M48062 Spinal stenosis, lumbar region with neurogenic claudication: Secondary | ICD-10-CM | POA: Diagnosis not present

## 2023-08-04 DIAGNOSIS — J209 Acute bronchitis, unspecified: Secondary | ICD-10-CM | POA: Diagnosis not present

## 2023-08-04 DIAGNOSIS — I10 Essential (primary) hypertension: Secondary | ICD-10-CM | POA: Diagnosis not present

## 2023-08-04 DIAGNOSIS — M4726 Other spondylosis with radiculopathy, lumbar region: Secondary | ICD-10-CM | POA: Diagnosis not present

## 2023-08-04 DIAGNOSIS — J0100 Acute maxillary sinusitis, unspecified: Secondary | ICD-10-CM | POA: Diagnosis not present

## 2023-08-04 DIAGNOSIS — S0083XD Contusion of other part of head, subsequent encounter: Secondary | ICD-10-CM | POA: Diagnosis not present

## 2023-08-04 DIAGNOSIS — J189 Pneumonia, unspecified organism: Secondary | ICD-10-CM | POA: Diagnosis not present

## 2023-08-07 ENCOUNTER — Telehealth: Payer: Self-pay | Admitting: Family Medicine

## 2023-08-07 NOTE — Telephone Encounter (Signed)
Copied from CRM [id number]. Topic: Medicare AWV >> Aug 07, 2023  1:42 PM Payton Doughty wrote: Reason for CRM: Called LVM 08/07/2023 to schedule Annual Wellness Visit  Verlee Rossetti; Care Guide Ambulatory Clinical Support [place] [facility] Direct Dial: [phone]

## 2023-08-08 ENCOUNTER — Other Ambulatory Visit: Payer: Self-pay | Admitting: Family Medicine

## 2023-08-08 NOTE — Telephone Encounter (Signed)
Requested medications are due for refill today.  yes  Requested medications are on the active medications list.  yes  Last refill. 05/02/2023 #60 1 rf  Future visit scheduled.   yes  Notes to clinic.  Refill not delegated.    Requested Prescriptions  Pending Prescriptions Disp Refills   traMADol (ULTRAM) 50 MG tablet [Pharmacy Med Name: TRAMADOL 50MG  TABLETS] 60 tablet     Sig: TAKE 1 TABLET(50 MG) BY MOUTH TWICE DAILY AS NEEDED     Not Delegated - Analgesics:  Opioid Agonists Failed - 08/08/2023  4:31 PM      Failed - This refill cannot be delegated      Failed - Urine Drug Screen completed in last 360 days      Passed - Valid encounter within last 3 months    Recent Outpatient Visits           1 week ago Atypical pneumonia   [facility] [place], Megan P, DO   1 month ago Abnormal lung sounds   [facility] [place], Megan P, DO   1 month ago Osteoarthritis of spine with radiculopathy, lumbar region   [facility] [place], [place], DO   3 months ago Anxiety   [place] [facility] [place], Megan P, DO   6 months ago Osteoarthritis of spine with radiculopathy, lumbar region   [facility] Dorcas [place], DO       Future Appointments             In 1 month Laural Benes, Oralia Rud, DO [facility] [place]   In 11 months Willeen Niece, MD [facility]

## 2023-08-09 DIAGNOSIS — S0083XD Contusion of other part of head, subsequent encounter: Secondary | ICD-10-CM | POA: Diagnosis not present

## 2023-08-09 DIAGNOSIS — M4726 Other spondylosis with radiculopathy, lumbar region: Secondary | ICD-10-CM | POA: Diagnosis not present

## 2023-08-09 DIAGNOSIS — I10 Essential (primary) hypertension: Secondary | ICD-10-CM | POA: Diagnosis not present

## 2023-08-09 DIAGNOSIS — J189 Pneumonia, unspecified organism: Secondary | ICD-10-CM | POA: Diagnosis not present

## 2023-08-09 DIAGNOSIS — J209 Acute bronchitis, unspecified: Secondary | ICD-10-CM | POA: Diagnosis not present

## 2023-08-09 DIAGNOSIS — J0100 Acute maxillary sinusitis, unspecified: Secondary | ICD-10-CM | POA: Diagnosis not present

## 2023-08-11 ENCOUNTER — Encounter: Payer: Self-pay | Admitting: Family Medicine

## 2023-08-14 MED ORDER — TRAMADOL HCL 50 MG PO TABS: 50.0000 mg | ORAL_TABLET | Freq: Two times a day (BID) | ORAL | 0 refills | Status: DC | PRN

## 2023-08-22 DIAGNOSIS — J0100 Acute maxillary sinusitis, unspecified: Secondary | ICD-10-CM | POA: Diagnosis not present

## 2023-08-22 DIAGNOSIS — J189 Pneumonia, unspecified organism: Secondary | ICD-10-CM | POA: Diagnosis not present

## 2023-08-22 DIAGNOSIS — S0083XD Contusion of other part of head, subsequent encounter: Secondary | ICD-10-CM | POA: Diagnosis not present

## 2023-08-22 DIAGNOSIS — M4726 Other spondylosis with radiculopathy, lumbar region: Secondary | ICD-10-CM | POA: Diagnosis not present

## 2023-08-22 DIAGNOSIS — I10 Essential (primary) hypertension: Secondary | ICD-10-CM | POA: Diagnosis not present

## 2023-08-22 DIAGNOSIS — J209 Acute bronchitis, unspecified: Secondary | ICD-10-CM | POA: Diagnosis not present

## 2023-08-26 DIAGNOSIS — F0394 Unspecified dementia, unspecified severity, with anxiety: Secondary | ICD-10-CM | POA: Diagnosis not present

## 2023-08-26 DIAGNOSIS — I10 Essential (primary) hypertension: Secondary | ICD-10-CM | POA: Diagnosis not present

## 2023-08-26 DIAGNOSIS — E785 Hyperlipidemia, unspecified: Secondary | ICD-10-CM | POA: Diagnosis not present

## 2023-08-26 DIAGNOSIS — M4726 Other spondylosis with radiculopathy, lumbar region: Secondary | ICD-10-CM | POA: Diagnosis not present

## 2023-08-26 DIAGNOSIS — H109 Unspecified conjunctivitis: Secondary | ICD-10-CM | POA: Diagnosis not present

## 2023-08-26 DIAGNOSIS — R531 Weakness: Secondary | ICD-10-CM | POA: Diagnosis not present

## 2023-08-26 DIAGNOSIS — F329 Major depressive disorder, single episode, unspecified: Secondary | ICD-10-CM | POA: Diagnosis not present

## 2023-08-26 DIAGNOSIS — F0393 Unspecified dementia, unspecified severity, with mood disturbance: Secondary | ICD-10-CM | POA: Diagnosis not present

## 2023-08-26 DIAGNOSIS — Z8701 Personal history of pneumonia (recurrent): Secondary | ICD-10-CM | POA: Diagnosis not present

## 2023-08-26 DIAGNOSIS — Z9181 History of falling: Secondary | ICD-10-CM | POA: Diagnosis not present

## 2023-08-26 DIAGNOSIS — J209 Acute bronchitis, unspecified: Secondary | ICD-10-CM | POA: Diagnosis not present

## 2023-08-29 DIAGNOSIS — I10 Essential (primary) hypertension: Secondary | ICD-10-CM | POA: Diagnosis not present

## 2023-08-29 DIAGNOSIS — F0393 Unspecified dementia, unspecified severity, with mood disturbance: Secondary | ICD-10-CM | POA: Diagnosis not present

## 2023-08-29 DIAGNOSIS — M4726 Other spondylosis with radiculopathy, lumbar region: Secondary | ICD-10-CM | POA: Diagnosis not present

## 2023-08-29 DIAGNOSIS — R531 Weakness: Secondary | ICD-10-CM | POA: Diagnosis not present

## 2023-08-29 DIAGNOSIS — F0394 Unspecified dementia, unspecified severity, with anxiety: Secondary | ICD-10-CM | POA: Diagnosis not present

## 2023-08-29 DIAGNOSIS — J209 Acute bronchitis, unspecified: Secondary | ICD-10-CM | POA: Diagnosis not present

## 2023-08-30 DIAGNOSIS — M4313 Spondylolisthesis, cervicothoracic region: Secondary | ICD-10-CM | POA: Diagnosis not present

## 2023-08-30 DIAGNOSIS — M47812 Spondylosis without myelopathy or radiculopathy, cervical region: Secondary | ICD-10-CM | POA: Diagnosis not present

## 2023-08-30 DIAGNOSIS — M4807 Spinal stenosis, lumbosacral region: Secondary | ICD-10-CM | POA: Diagnosis not present

## 2023-08-30 DIAGNOSIS — M5412 Radiculopathy, cervical region: Secondary | ICD-10-CM | POA: Diagnosis not present

## 2023-08-30 DIAGNOSIS — M5416 Radiculopathy, lumbar region: Secondary | ICD-10-CM | POA: Diagnosis not present

## 2023-08-30 DIAGNOSIS — M5380 Other specified dorsopathies, site unspecified: Secondary | ICD-10-CM | POA: Diagnosis not present

## 2023-09-04 DIAGNOSIS — I10 Essential (primary) hypertension: Secondary | ICD-10-CM | POA: Diagnosis not present

## 2023-09-04 DIAGNOSIS — M4726 Other spondylosis with radiculopathy, lumbar region: Secondary | ICD-10-CM | POA: Diagnosis not present

## 2023-09-04 DIAGNOSIS — J209 Acute bronchitis, unspecified: Secondary | ICD-10-CM | POA: Diagnosis not present

## 2023-09-04 DIAGNOSIS — Z9181 History of falling: Secondary | ICD-10-CM | POA: Diagnosis not present

## 2023-09-04 DIAGNOSIS — F0393 Unspecified dementia, unspecified severity, with mood disturbance: Secondary | ICD-10-CM | POA: Diagnosis not present

## 2023-09-04 DIAGNOSIS — F0394 Unspecified dementia, unspecified severity, with anxiety: Secondary | ICD-10-CM | POA: Diagnosis not present

## 2023-09-04 DIAGNOSIS — Z8701 Personal history of pneumonia (recurrent): Secondary | ICD-10-CM | POA: Diagnosis not present

## 2023-09-04 DIAGNOSIS — F329 Major depressive disorder, single episode, unspecified: Secondary | ICD-10-CM | POA: Diagnosis not present

## 2023-09-04 DIAGNOSIS — H109 Unspecified conjunctivitis: Secondary | ICD-10-CM | POA: Diagnosis not present

## 2023-09-04 DIAGNOSIS — R531 Weakness: Secondary | ICD-10-CM | POA: Diagnosis not present

## 2023-09-04 DIAGNOSIS — E785 Hyperlipidemia, unspecified: Secondary | ICD-10-CM | POA: Diagnosis not present

## 2023-09-14 ENCOUNTER — Encounter: Payer: Self-pay | Admitting: Family Medicine

## 2023-09-14 ENCOUNTER — Ambulatory Visit (INDEPENDENT_AMBULATORY_CARE_PROVIDER_SITE_OTHER): Payer: Medicare Other | Admitting: Family Medicine

## 2023-09-14 VITALS — BP 130/72 | HR 97 | Temp 97.9°F | Wt 184.6 lb

## 2023-09-14 DIAGNOSIS — F331 Major depressive disorder, recurrent, moderate: Secondary | ICD-10-CM

## 2023-09-14 DIAGNOSIS — F419 Anxiety disorder, unspecified: Secondary | ICD-10-CM

## 2023-09-14 DIAGNOSIS — G894 Chronic pain syndrome: Secondary | ICD-10-CM | POA: Insufficient documentation

## 2023-09-14 MED ORDER — TRAMADOL HCL 50 MG PO TABS: 50.0000 mg | ORAL_TABLET | Freq: Two times a day (BID) | ORAL | 2 refills | Status: DC | PRN

## 2023-09-14 MED ORDER — OMEPRAZOLE 20 MG PO CPDR: 20.0000 mg | DELAYED_RELEASE_CAPSULE | Freq: Every day | ORAL | 0 refills | Status: DC

## 2023-09-14 MED ORDER — AMLODIPINE BESYLATE 2.5 MG PO TABS: 2.5000 mg | ORAL_TABLET | Freq: Every day | ORAL | 0 refills | Status: DC

## 2023-09-14 MED ORDER — QUETIAPINE FUMARATE 25 MG PO TABS: 25.0000 mg | ORAL_TABLET | Freq: Every day | ORAL | 0 refills | Status: DC

## 2023-09-14 MED ORDER — LOSARTAN POTASSIUM 50 MG PO TABS: 50.0000 mg | ORAL_TABLET | Freq: Every day | ORAL | 0 refills | Status: DC

## 2023-09-14 MED ORDER — ATORVASTATIN CALCIUM 40 MG PO TABS: 40.0000 mg | ORAL_TABLET | Freq: Every day | ORAL | 0 refills | Status: DC

## 2023-09-14 MED ORDER — DULOXETINE HCL 60 MG PO CPEP: 60.0000 mg | ORAL_CAPSULE | Freq: Every day | ORAL | 0 refills | Status: DC

## 2023-09-14 MED ORDER — MIRTAZAPINE 15 MG PO TABS: 15.0000 mg | ORAL_TABLET | Freq: Every day | ORAL | 1 refills | Status: DC

## 2023-09-14 NOTE — Assessment & Plan Note (Signed)
Under good control on current regimen. Continue current regimen. Continue to monitor. Call with any concerns. Refills given.   

## 2023-09-14 NOTE — Assessment & Plan Note (Signed)
Using tramadol occasionally 1-2x a day. Refills given. Follow up 3 months.

## 2023-09-14 NOTE — Progress Notes (Signed)
BP 130/72   Pulse 97   Temp 97.9 F (36.6 C) (Oral)   Wt 184 lb 9.6 oz (83.7 kg)   LMP  (LMP Unknown)   SpO2 95%   BMI 34.88 kg/m    Subjective:    Patient ID: Cynthia Vaughan, female    DOB: Dec 30, 1940, 83 y.o.   MRN: [id number]  HPI: Cynthia Vaughan is a 83 y.o. female  Chief Complaint  Patient presents with   Depression   Anxiety   Pain   ANXIETY/DEPRESSION Duration: chronic Status:better Anxious mood: yes  Excessive worrying: no Irritability: no  Sweating: no Nausea: no Palpitations:no Hyperventilation: no Panic attacks: no Agoraphobia: no  Obscessions/compulsions: no Depressed mood: yes    09/14/2023    1:46 PM 07/28/2023    2:36 PM 07/07/2023    1:14 PM 06/13/2023    1:47 PM 05/02/2023    3:33 PM  Depression screen PHQ 2/9  Decreased Interest 2 2 2 2 3   Down, Depressed, Hopeless 2 1 1 1 2   PHQ - 2 Score 4 3 3 3 5   Altered sleeping 2 1 1 1 3   Tired, decreased energy 2 2 3 3 3   Change in appetite 2 1 2 2 3   Feeling bad or failure about yourself  1 0 1 1 1   Trouble concentrating 1 1 1 1 1   Moving slowly or fidgety/restless 0 0 0 0 1  Suicidal thoughts 0 0 0 0 0  PHQ-9 Score 12 8 11 11 17   Difficult doing work/chores Very difficult Somewhat difficult Very difficult Somewhat difficult Very difficult      09/14/2023    1:47 PM 07/28/2023    2:36 PM 07/07/2023    1:14 PM 06/13/2023    1:47 PM  GAD 7 : Generalized Anxiety Score  Nervous, Anxious, on Edge 2 1 1 1   Control/stop worrying 1 1 1 1   Worry too much - different things 1 1 1  0  Trouble relaxing 1 0 1 1  Restless 0 0 0 0  Easily annoyed or irritable 0 0 1 1  Afraid - awful might happen 1 1 1  0  Total GAD 7 Score 6 4 6 4   Anxiety Difficulty  Somewhat difficult Somewhat difficult Not difficult at all   Anhedonia: no Weight changes: no Insomnia: no   Hypersomnia: no Fatigue/loss of energy: no Feelings of worthlessness: no Feelings of guilt: no Impaired concentration/indecisiveness:  no Suicidal ideations: no  Crying spells: no Recent Stressors/Life Changes: no   Relationship problems: no   Family stress: no     Financial stress: no    Job stress: no    Recent death/loss: yes  CHRONIC PAIN  Present dose: 10 Morphine equivalents Pain control status: stable Duration: chronic Location: low back, legs, joints Quality: aching and sore Current Pain Level: moderate Previous Pain Level: moderate Breakthrough pain: yes Benefit from narcotic medications: yes What Activities task can be accomplished with current medication? Able to go out of her house Interested in weaning off narcotics:no   Stool softners/OTC fiber: no  Previous pain specialty evaluation: no Non-narcotic analgesic meds: yes Narcotic contract: no   Relevant past medical, surgical, family and social history reviewed and updated as indicated. Interim medical history since our last visit reviewed. Allergies and medications reviewed and updated.  Review of Systems  Constitutional: Negative.   Respiratory: Negative.    Cardiovascular: Negative.   Musculoskeletal:  Positive for arthralgias, back pain, gait problem and myalgias. Negative for  joint swelling, neck pain and neck stiffness.  Skin: Negative.   Psychiatric/Behavioral: Negative.      Per HPI unless specifically indicated above     Objective:    BP 130/72   Pulse 97   Temp 97.9 F (36.6 C) (Oral)   Wt 184 lb 9.6 oz (83.7 kg)   LMP  (LMP Unknown)   SpO2 95%   BMI 34.88 kg/m   Wt Readings from Last 3 Encounters:  09/14/23 184 lb 9.6 oz (83.7 kg)  07/28/23 179 lb 3.2 oz (81.3 kg)  07/07/23 179 lb 3.2 oz (81.3 kg)    Physical Exam Vitals and nursing note reviewed.  Constitutional:      General: She is not in acute distress.    Appearance: Normal appearance. She is obese. She is not ill-appearing, toxic-appearing or diaphoretic.  HENT:     Head: Normocephalic and atraumatic.     Right Ear: External ear normal.     Left Ear:  External ear normal.     Nose: Nose normal.     Mouth/Throat:     Mouth: Mucous membranes are moist.     Pharynx: Oropharynx is clear.  Eyes:     General: No scleral icterus.       Right eye: No discharge.        Left eye: No discharge.     Extraocular Movements: Extraocular movements intact.     Conjunctiva/sclera: Conjunctivae normal.     Pupils: Pupils are equal, round, and reactive to light.  Cardiovascular:     Rate and Rhythm: Normal rate and regular rhythm.     Pulses: Normal pulses.     Heart sounds: Normal heart sounds. No murmur heard.    No friction rub. No gallop.  Pulmonary:     Effort: Pulmonary effort is normal. No respiratory distress.     Breath sounds: Normal breath sounds. No stridor. No wheezing, rhonchi or rales.  Chest:     Chest wall: No tenderness.  Musculoskeletal:        General: Normal range of motion.     Cervical back: Normal range of motion and neck supple.  Skin:    General: Skin is warm and dry.     Capillary Refill: Capillary refill takes less than 2 seconds.     Coloration: Skin is not jaundiced or pale.     Findings: No bruising, erythema, lesion or rash.  Neurological:     General: No focal deficit present.     Mental Status: She is alert and oriented to person, place, and time. Mental status is at baseline.  Psychiatric:        Mood and Affect: Mood normal.        Behavior: Behavior normal.        Thought Content: Thought content normal.        Judgment: Judgment normal.     Results for orders placed or performed during the hospital encounter of 06/22/23  Resp panel by RT-PCR (RSV, Flu A&B, Covid) Anterior Nasal Swab   Collection Time: 06/21/23 11:46 PM   Specimen: Anterior Nasal Swab  Result Value Ref Range   SARS Coronavirus 2 by RT PCR NEGATIVE NEGATIVE   Influenza A by PCR NEGATIVE NEGATIVE   Influenza B by PCR NEGATIVE NEGATIVE   Resp Syncytial Virus by PCR NEGATIVE NEGATIVE  CBC with Differential   Collection Time: 06/22/23   1:58 AM  Result Value Ref Range   WBC 7.9 4.0 - 10.5 K/uL  RBC 4.04 3.87 - 5.11 MIL/uL   Hemoglobin 13.0 12.0 - 15.0 g/dL   HCT 52.8 41.3 - 24.4 %   MCV 99.8 80.0 - 100.0 fL   MCH 32.2 26.0 - 34.0 pg   MCHC 32.3 30.0 - 36.0 g/dL   RDW 01.0 27.2 - 53.6 %   Platelets 193 150 - 400 K/uL   nRBC 0.0 0.0 - 0.2 %   Neutrophils Relative % 55 %   Neutro Abs 4.3 1.7 - 7.7 K/uL   Lymphocytes Relative 20 %   Lymphs Abs 1.6 0.7 - 4.0 K/uL   Monocytes Relative 20 %   Monocytes Absolute 1.6 (H) 0.1 - 1.0 K/uL   Eosinophils Relative 2 %   Eosinophils Absolute 0.2 0.0 - 0.5 K/uL   Basophils Relative 1 %   Basophils Absolute 0.1 0.0 - 0.1 K/uL   Immature Granulocytes 2 %   Abs Immature Granulocytes 0.12 (H) 0.00 - 0.07 K/uL  Comprehensive metabolic panel   Collection Time: 06/22/23  1:58 AM  Result Value Ref Range   Sodium 135 135 - 145 mmol/L   Potassium 3.8 3.5 - 5.1 mmol/L   Chloride 103 98 - 111 mmol/L   CO2 24 22 - 32 mmol/L   Glucose, Bld 156 (H) 70 - 99 mg/dL   BUN 17 8 - 23 mg/dL   Creatinine, Ser 6.44 0.44 - 1.00 mg/dL   Calcium 8.4 (L) 8.9 - 10.3 mg/dL   Total Protein 7.2 6.5 - 8.1 g/dL   Albumin 3.4 (L) 3.5 - 5.0 g/dL   AST 41 15 - 41 U/L   ALT 28 0 - 44 U/L   Alkaline Phosphatase 65 38 - 126 U/L   Total Bilirubin 0.5 0.3 - 1.2 mg/dL   GFR, Estimated >03 >47 mL/min   Anion gap 8 5 - 15  Troponin I (High Sensitivity)   Collection Time: 06/22/23  1:58 AM  Result Value Ref Range   Troponin I (High Sensitivity) 7 <18 ng/L  Troponin I (High Sensitivity)   Collection Time: 06/22/23  5:36 AM  Result Value Ref Range   Troponin I (High Sensitivity) 8 <18 ng/L  Respiratory (~20 pathogens) panel by PCR   Collection Time: 06/22/23 11:50 AM   Specimen: Nasopharyngeal Swab; Respiratory  Result Value Ref Range   Adenovirus NOT DETECTED NOT DETECTED   Coronavirus 229E NOT DETECTED NOT DETECTED   Coronavirus HKU1 NOT DETECTED NOT DETECTED   Coronavirus NL63 NOT DETECTED NOT  DETECTED   Coronavirus OC43 NOT DETECTED NOT DETECTED   Metapneumovirus NOT DETECTED NOT DETECTED   Rhinovirus / Enterovirus NOT DETECTED NOT DETECTED   Influenza A NOT DETECTED NOT DETECTED   Influenza B NOT DETECTED NOT DETECTED   Parainfluenza Virus 1 NOT DETECTED NOT DETECTED   Parainfluenza Virus 2 NOT DETECTED NOT DETECTED   Parainfluenza Virus 3 NOT DETECTED NOT DETECTED   Parainfluenza Virus 4 NOT DETECTED NOT DETECTED   Respiratory Syncytial Virus NOT DETECTED NOT DETECTED   Bordetella pertussis NOT DETECTED NOT DETECTED   Bordetella Parapertussis NOT DETECTED NOT DETECTED   Chlamydophila pneumoniae NOT DETECTED NOT DETECTED   Mycoplasma pneumoniae NOT DETECTED NOT DETECTED      Assessment & Plan:   Problem List Items Addressed This Visit       Other   Anxiety - Primary   Under good control on current regimen. Continue current regimen. Continue to monitor. Call with any concerns. Refills given.        Relevant  Medications   mirtazapine (REMERON) 15 MG tablet   DULoxetine (CYMBALTA) 60 MG capsule   MDD (major depressive disorder), recurrent episode, moderate (HCC)   Under good control on current regimen. Continue current regimen. Continue to monitor. Call with any concerns. Refills given.        Relevant Medications   mirtazapine (REMERON) 15 MG tablet   DULoxetine (CYMBALTA) 60 MG capsule   Chronic pain syndrome   Using tramadol occasionally 1-2x a day. Refills given. Follow up 3 months.       Relevant Medications   mirtazapine (REMERON) 15 MG tablet   DULoxetine (CYMBALTA) 60 MG capsule   traMADol (ULTRAM) 50 MG tablet     Follow up plan: Return in about 3 months (around 12/13/2023) for physical.

## 2023-09-22 DIAGNOSIS — J209 Acute bronchitis, unspecified: Secondary | ICD-10-CM | POA: Diagnosis not present

## 2023-09-22 DIAGNOSIS — R531 Weakness: Secondary | ICD-10-CM | POA: Diagnosis not present

## 2023-09-22 DIAGNOSIS — F0394 Unspecified dementia, unspecified severity, with anxiety: Secondary | ICD-10-CM | POA: Diagnosis not present

## 2023-09-22 DIAGNOSIS — F0393 Unspecified dementia, unspecified severity, with mood disturbance: Secondary | ICD-10-CM | POA: Diagnosis not present

## 2023-09-22 DIAGNOSIS — M4726 Other spondylosis with radiculopathy, lumbar region: Secondary | ICD-10-CM | POA: Diagnosis not present

## 2023-09-22 DIAGNOSIS — I10 Essential (primary) hypertension: Secondary | ICD-10-CM | POA: Diagnosis not present

## 2023-09-25 DIAGNOSIS — I10 Essential (primary) hypertension: Secondary | ICD-10-CM | POA: Diagnosis not present

## 2023-09-25 DIAGNOSIS — J209 Acute bronchitis, unspecified: Secondary | ICD-10-CM | POA: Diagnosis not present

## 2023-09-25 DIAGNOSIS — F0393 Unspecified dementia, unspecified severity, with mood disturbance: Secondary | ICD-10-CM | POA: Diagnosis not present

## 2023-09-25 DIAGNOSIS — F0394 Unspecified dementia, unspecified severity, with anxiety: Secondary | ICD-10-CM | POA: Diagnosis not present

## 2023-09-25 DIAGNOSIS — Z9181 History of falling: Secondary | ICD-10-CM | POA: Diagnosis not present

## 2023-09-25 DIAGNOSIS — R531 Weakness: Secondary | ICD-10-CM | POA: Diagnosis not present

## 2023-09-25 DIAGNOSIS — H109 Unspecified conjunctivitis: Secondary | ICD-10-CM | POA: Diagnosis not present

## 2023-09-25 DIAGNOSIS — F329 Major depressive disorder, single episode, unspecified: Secondary | ICD-10-CM | POA: Diagnosis not present

## 2023-09-25 DIAGNOSIS — M4726 Other spondylosis with radiculopathy, lumbar region: Secondary | ICD-10-CM | POA: Diagnosis not present

## 2023-09-25 DIAGNOSIS — Z8701 Personal history of pneumonia (recurrent): Secondary | ICD-10-CM | POA: Diagnosis not present

## 2023-09-25 DIAGNOSIS — E785 Hyperlipidemia, unspecified: Secondary | ICD-10-CM | POA: Diagnosis not present

## 2023-09-28 ENCOUNTER — Telehealth: Payer: Self-pay

## 2023-09-28 NOTE — Telephone Encounter (Signed)
 FYI to provider.    Copied from CRM [id number]. Topic: General - Other >> Sep 21, 2023  3:32 PM Turkey B wrote: Reason for CRM: Athena Bland from [place], states pt missed pt appt on Jan 20.

## 2023-09-28 NOTE — Telephone Encounter (Signed)
 Noted.

## 2023-10-16 DIAGNOSIS — M5380 Other specified dorsopathies, site unspecified: Secondary | ICD-10-CM | POA: Diagnosis not present

## 2023-10-16 DIAGNOSIS — M48062 Spinal stenosis, lumbar region with neurogenic claudication: Secondary | ICD-10-CM | POA: Diagnosis not present

## 2023-10-16 DIAGNOSIS — M5416 Radiculopathy, lumbar region: Secondary | ICD-10-CM | POA: Diagnosis not present

## 2023-10-23 DIAGNOSIS — I10 Essential (primary) hypertension: Secondary | ICD-10-CM | POA: Diagnosis not present

## 2023-10-23 DIAGNOSIS — M4726 Other spondylosis with radiculopathy, lumbar region: Secondary | ICD-10-CM | POA: Diagnosis not present

## 2023-10-23 DIAGNOSIS — R531 Weakness: Secondary | ICD-10-CM | POA: Diagnosis not present

## 2023-10-23 DIAGNOSIS — F0393 Unspecified dementia, unspecified severity, with mood disturbance: Secondary | ICD-10-CM | POA: Diagnosis not present

## 2023-10-23 DIAGNOSIS — F0394 Unspecified dementia, unspecified severity, with anxiety: Secondary | ICD-10-CM | POA: Diagnosis not present

## 2023-10-23 DIAGNOSIS — J209 Acute bronchitis, unspecified: Secondary | ICD-10-CM | POA: Diagnosis not present

## 2023-10-25 ENCOUNTER — Telehealth: Payer: Self-pay

## 2023-10-25 NOTE — Telephone Encounter (Signed)
 FYI to provider.   Copied from CRM [id number]. Topic: Clinical - Home Health Verbal Orders >> Oct 25, 2023  1:30 PM Franchot Heidelberg wrote: Caller/Agency: Shepp[facility] Evens Number: [phone] Service Requested: Physical Therapy Frequency: Missed a visit last week 10/20/2023 Any new concerns about the patient?   Pt and husband said they are not interested in home health services so they are now discharged.

## 2023-10-25 NOTE — Telephone Encounter (Signed)
 Noted.

## 2023-11-08 ENCOUNTER — Other Ambulatory Visit: Payer: Self-pay | Admitting: Family Medicine

## 2023-11-09 NOTE — Telephone Encounter (Signed)
 Requested Prescriptions  Pending Prescriptions Disp Refills   fluticasone (FLONASE) 50 MCG/ACT nasal spray [Pharmacy Med Name: Fluticasone Propionate 61mcg/actuation Nasal Spray] 48 g 0    Sig: Instill 2 sprays into each nostril daily.     Ear, Nose, and Throat: Nasal Preparations - Corticosteroids Passed - 11/09/2023 10:32 AM      Passed - Valid encounter within last 12 months    Recent Outpatient Visits           1 month ago Anxiety   [place] [facility] [place], Megan P, DO   3 months ago Atypical pneumonia   [place] [facility] [place], Megan P, DO   4 months ago Abnormal lung sounds   [place] [facility] [place], Megan P, DO   4 months ago Osteoarthritis of spine with radiculopathy, lumbar region   [facility] [place], [place], DO   6 months ago Anxiety   [facility] [place], Oralia Rud, DO       Future Appointments             In 1 month Laural Benes, Oralia Rud, DO [facility]   In 8 months Willeen Niece, MD [facility]

## 2023-11-14 ENCOUNTER — Other Ambulatory Visit: Payer: Self-pay

## 2023-11-14 MED ORDER — FLUTICASONE PROPIONATE 50 MCG/ACT NA SUSP: 2.0000 | Freq: Every day | NASAL | 0 refills | Status: DC

## 2023-12-08 ENCOUNTER — Other Ambulatory Visit: Payer: Self-pay | Admitting: Family Medicine

## 2023-12-08 NOTE — Telephone Encounter (Signed)
 Requested Prescriptions  Pending Prescriptions Disp Refills   buPROPion  (WELLBUTRIN  XL) 150 MG 24 hr tablet [Pharmacy Med Name: Bupropion  Hydrochloride 150mg  Extended-Release (XL) Tablet] 90 tablet 0    Sig: Take 1 tablet by mouth daily.     Psychiatry: Antidepressants - bupropion  Failed - 12/08/2023  1:56 PM      Failed - Valid encounter within last 6 months    Recent Outpatient Visits   None     Future Appointments             In 1 week Vicci Duwaine SQUIBB, DO [facility]   In 7 months Jackquline Sawyer, MD [facility]            Passed - Cr in normal range and within 360 days    Creatinine  Date Value Ref Range Status  06/08/2014 0.73 0.60 - 1.30 mg/dL Final   Creatinine, Ser  Date Value Ref Range Status  06/22/2023 0.74 0.44 - 1.00 mg/dL Final         Passed - AST in normal range and within 360 days    AST  Date Value Ref Range Status  06/22/2023 41 15 - 41 U/L Final   SGOT(AST)  Date Value Ref Range Status  06/08/2014 31 15 - 37 Unit/L Final         Passed - ALT in normal range and within 360 days    ALT  Date Value Ref Range Status  06/22/2023 28 0 - 44 U/L Final   SGPT (ALT)  Date Value Ref Range Status  06/08/2014 38 U/L Final    Comment:    14-63 NOTE: New Reference Range 03/11/14          Passed - Completed PHQ-2 or PHQ-9 in the last 360 days      Passed - Last BP in normal range    BP Readings from Last 1 Encounters:  09/14/23 130/72

## 2023-12-19 ENCOUNTER — Encounter: Payer: Self-pay | Admitting: Family Medicine

## 2023-12-19 ENCOUNTER — Ambulatory Visit (INDEPENDENT_AMBULATORY_CARE_PROVIDER_SITE_OTHER): Payer: Self-pay | Admitting: Family Medicine

## 2023-12-19 VITALS — BP 116/72 | HR 93 | Ht 61.0 in | Wt 187.8 lb

## 2023-12-19 DIAGNOSIS — M4726 Other spondylosis with radiculopathy, lumbar region: Secondary | ICD-10-CM | POA: Diagnosis not present

## 2023-12-19 DIAGNOSIS — F419 Anxiety disorder, unspecified: Secondary | ICD-10-CM

## 2023-12-19 DIAGNOSIS — I129 Hypertensive chronic kidney disease with stage 1 through stage 4 chronic kidney disease, or unspecified chronic kidney disease: Secondary | ICD-10-CM

## 2023-12-19 DIAGNOSIS — E782 Mixed hyperlipidemia: Secondary | ICD-10-CM

## 2023-12-19 DIAGNOSIS — F0390 Unspecified dementia without behavioral disturbance: Secondary | ICD-10-CM | POA: Diagnosis not present

## 2023-12-19 DIAGNOSIS — Z0000 Encounter for general adult medical examination without abnormal findings: Secondary | ICD-10-CM

## 2023-12-19 DIAGNOSIS — F331 Major depressive disorder, recurrent, moderate: Secondary | ICD-10-CM

## 2023-12-19 DIAGNOSIS — G20A1 Parkinson's disease without dyskinesia, without mention of fluctuations: Secondary | ICD-10-CM

## 2023-12-19 DIAGNOSIS — E559 Vitamin D deficiency, unspecified: Secondary | ICD-10-CM | POA: Diagnosis not present

## 2023-12-19 DIAGNOSIS — R739 Hyperglycemia, unspecified: Secondary | ICD-10-CM | POA: Diagnosis not present

## 2023-12-19 LAB — BAYER DCA HB A1C WAIVED: HB A1C (BAYER DCA - WAIVED): 6.3 % — ABNORMAL HIGH (ref 4.8–5.6)

## 2023-12-19 MED ORDER — ATORVASTATIN CALCIUM 40 MG PO TABS: 40.0000 mg | ORAL_TABLET | Freq: Every day | ORAL | 1 refills | Status: DC

## 2023-12-19 MED ORDER — AMLODIPINE BESYLATE 2.5 MG PO TABS: 2.5000 mg | ORAL_TABLET | Freq: Every day | ORAL | 1 refills | Status: DC

## 2023-12-19 MED ORDER — TRAMADOL HCL 50 MG PO TABS: 50.0000 mg | ORAL_TABLET | Freq: Two times a day (BID) | ORAL | 2 refills | Status: DC | PRN

## 2023-12-19 MED ORDER — DULOXETINE HCL 60 MG PO CPEP: 60.0000 mg | ORAL_CAPSULE | Freq: Every day | ORAL | 1 refills | Status: DC

## 2023-12-19 MED ORDER — LOSARTAN POTASSIUM 50 MG PO TABS: 50.0000 mg | ORAL_TABLET | Freq: Every day | ORAL | 1 refills | Status: DC

## 2023-12-19 MED ORDER — FLUTICASONE PROPIONATE 50 MCG/ACT NA SUSP: 2.0000 | Freq: Every day | NASAL | 4 refills | Status: AC

## 2023-12-19 MED ORDER — LORATADINE 10 MG PO TABS: 10.0000 mg | ORAL_TABLET | Freq: Every day | ORAL | 1 refills | Status: DC

## 2023-12-19 MED ORDER — BUPROPION HCL ER (XL) 150 MG PO TB24: 150.0000 mg | ORAL_TABLET | Freq: Every day | ORAL | 1 refills | Status: DC

## 2023-12-19 MED ORDER — OMEPRAZOLE 20 MG PO CPDR: 20.0000 mg | DELAYED_RELEASE_CAPSULE | Freq: Every day | ORAL | 1 refills | Status: DC

## 2023-12-19 MED ORDER — NYSTATIN 100000 UNIT/GM EX POWD: 1.0000 | Freq: Three times a day (TID) | CUTANEOUS | 0 refills | Status: AC

## 2023-12-19 MED ORDER — TRAZODONE HCL 50 MG PO TABS: 25.0000 mg | ORAL_TABLET | Freq: Every evening | ORAL | 1 refills | Status: DC | PRN

## 2023-12-19 MED ORDER — QUETIAPINE FUMARATE 25 MG PO TABS: 25.0000 mg | ORAL_TABLET | Freq: Every day | ORAL | 1 refills | Status: DC

## 2023-12-19 MED ORDER — MIRTAZAPINE 15 MG PO TABS: 15.0000 mg | ORAL_TABLET | Freq: Every day | ORAL | 1 refills | Status: DC

## 2023-12-19 NOTE — Patient Instructions (Signed)
 Cynthia Vaughan , Thank you for taking time to come for your Medicare Wellness Visit. I appreciate your ongoing commitment to your health goals. Please review the following plan we discussed and let me know if I can assist you in the future.   These are the goals we discussed:  Goals       "She might need help managing her medications" (pt-stated)      Current Barriers:  Diminished mood/flat affect, as well as "sleepiness" over the past few months. Additionally, patient struggling with tremor that inhibits some activities (husband notes she cannot hold a cup of coffee) Established with psychiatry on 02/18/2019 - d/c aripiprazole  and reduced duloxetine , started bupropion . At follow up appointment on 04/01/2019, it was noted that patient was improved; reported improvement in energy/motivation, as well as improvement in mood. Denies trouble sleeping. Today, patient's husband, Cynthia Vaughan, confirms the above information. Also notes possible improvement in tremor. Confirms neurology follow up on 05/01/2019 with Dr. Walden Guise Does note that he picked up a prescription for donepezil  10 mg daily from [facility] that was prescribed by neurology. Somewhat frustrated, as they prefer all medications come from [facility]. This was how patient accidentally took 20 mg donepezil  for a while; they filled prescriptions at both [facility] and [facility] (unsure how insurance allowed this)   Pharmacist Clinical Goal(s):  Over the next 90 days, patient will work with primary care provider and CCM team to address needs related to optimized medication management  Interventions: Husband knows to look out at upcoming [facility] fill and if donepezil  is included, to not give an extra donepezil  from the [facility] fill. Encouraged him to update the patient's preferred pharmacy at [facility] when they go next month  Patient Self Care Activities:  Currently UNABLE TO independently manage health  Please see past updates related to this  goal by clicking on the "Past Updates" button in the selected goal        I am just so sleepy and I don't know why (pt-stated)      Current Barriers:  Knowledge Deficits related to recent symptom of increased letharygy   Nurse Case Manager Clinical Goal(s):  Over the next 90 days, patient will work with [facility] to address needs related to incresed letharygy  Interventions:  Provided education to patient re: on CCM and what resources CCM is availible to provide    Met with patient and her spouse in exam room. 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  Spouse states patient continues to extremely lethargic Spouse states Home Health has started and is helpful Spouse states Neurology ordered Psych referral but he has not heard from them,  Left a Vm with Psych referral coordinator Inge Mangle Referral coordinator called and confirmed she was able to reach spouse and schedule appointment- July 9 with Dr. Herman Longs @ [facility]    Patient Self Care Activities:  Currently UNABLE TO independently maintain activities of daily living realted to increased lethargy  Please see past updates related to this goal by clicking on the "Past Updates" button in the selected goal        Increase water intake       Recommend drinking at least 3-4 glasses of water a day       Patient Stated  06/15/2020, no goals      Patient Stated      06/18/2021, no goals      Patient Stated      No goals        This is a list of the screening recommended for you and due dates:  Health Maintenance  Topic Date Due   Zoster (Shingles) Vaccine (1 of 2) 07/06/2024*   Flu Shot  03/22/2024   Medicare Annual Wellness Visit  12/18/2024   DTaP/Tdap/Td vaccine (3 - Tdap) 06/01/2028   Pneumonia Vaccine   Completed   DEXA scan (bone density measurement)  Addressed   HPV Vaccine  Aged Out   Meningitis B Vaccine  Aged Out   COVID-19 Vaccine  Discontinued   Hepatitis C Screening  Discontinued  *Topic was postponed. The date shown is not the original due date.

## 2023-12-19 NOTE — Assessment & Plan Note (Signed)
 Refill of tramadol  sent today. Call with any concerns. Recheck 3 months.

## 2023-12-19 NOTE — Assessment & Plan Note (Signed)
 Under good control on current regimen. Continue current regimen. Continue to monitor. Call with any concerns. Refills given. Labs drawn today.

## 2023-12-19 NOTE — Assessment & Plan Note (Signed)
 Continues to have help. Continue to follow with neurology. Call with any concerns.

## 2023-12-19 NOTE — Assessment & Plan Note (Signed)
 Under good control on current regimen. Continue current regimen. Continue to monitor. Call with any concerns. Refills given.

## 2023-12-19 NOTE — Progress Notes (Signed)
 BP 116/72   Pulse 93   Ht 5\' 1"  (1.549 m)   Wt 187 lb 12.8 oz (85.2 kg)   LMP  (LMP Unknown)   SpO2 96%   BMI 35.48 kg/m    Subjective:    Patient ID: Cynthia Vaughan, female    DOB: 1941-05-26, 83 y.o.   MRN: [id number]  HPI: Cynthia Vaughan is a 83 y.o. female  Chief Complaint  Patient presents with   Hypertension   Hyperlipidemia   Depression   HYPERTENSION / HYPERLIPIDEMIA Satisfied with current treatment? yes Duration of hypertension: chronic BP monitoring frequency: not checking BP medication side effects: no Past BP meds: amlodipine , losartan  Duration of hyperlipidemia: chronic Cholesterol medication side effects: no Cholesterol supplements: none Past cholesterol medications: atorvastatin  Medication compliance: excellent compliance Aspirin : yes Recent stressors: no Recurrent headaches: no Visual changes: no Palpitations: no Dyspnea: no Chest pain: no Lower extremity edema: no Dizzy/lightheaded: no  DEPRESSION Mood status: stable Satisfied with current treatment?: yes Symptom severity: mild  Duration of current treatment : chronic Side effects: no Medication compliance: excellent compliance Psychotherapy/counseling: no  Previous psychiatric medications: wellbutrin , cymbalta , remeron , seroquel  Depressed mood: no Anxious mood: no Anhedonia: no Significant weight loss or gain: no Insomnia: no  Fatigue: yes Feelings of worthlessness or guilt: no Impaired concentration/indecisiveness: no Suicidal ideations: no Hopelessness: no Crying spells: no    12/19/2023    1:26 PM 09/14/2023    1:46 PM 07/28/2023    2:36 PM 07/07/2023    1:14 PM 06/13/2023    1:47 PM  Depression screen PHQ 2/9  Decreased Interest 2 2 2 2 2   Down, Depressed, Hopeless 1 2 1 1 1   PHQ - 2 Score 3 4 3 3 3   Altered sleeping 1 2 1 1 1   Tired, decreased energy 2 2 2 3 3   Change in appetite 2 2 1 2 2   Feeling bad or failure about yourself  1 1 0 1 1  Trouble concentrating 1 1  1 1 1   Moving slowly or fidgety/restless 1 0 0 0 0  Suicidal thoughts 1 0 0 0 0  PHQ-9 Score 12 12 8 11 11   Difficult doing work/chores Very difficult Very difficult Somewhat difficult Very difficult Somewhat difficult   CHRONIC PAIN  Present dose: 10  Morphine equivalents Pain control status: controlled Duration: chronic Location: low back Quality: aching and sore Current Pain Level: moderate Previous Pain Level: severe Breakthrough pain: no Benefit from narcotic medications: yes What Activities task can be accomplished with current medication? Able to remain comfortable Interested in weaning off narcotics:no   Stool softners/OTC fiber: yes  Previous pain specialty evaluation: yes Non-narcotic analgesic meds: yes Narcotic contract: yes   Relevant past medical, surgical, family and social history reviewed and updated as indicated. Interim medical history since our last visit reviewed. Allergies and medications reviewed and updated.  Review of Systems  Constitutional: Negative.   HENT: Negative.    Eyes: Negative.   Respiratory: Negative.    Cardiovascular: Negative.   Gastrointestinal: Negative.   Endocrine: Negative.   Genitourinary: Negative.   Musculoskeletal:  Positive for back pain, gait problem and myalgias. Negative for arthralgias, joint swelling, neck pain and neck stiffness.  Skin:  Positive for rash. Negative for color change, pallor and wound.  Allergic/Immunologic: Positive for environmental allergies. Negative for food allergies and immunocompromised state.  Neurological:  Negative for dizziness, tremors, seizures, syncope, facial asymmetry, speech difficulty, weakness, light-headedness, numbness and headaches.  Hematological: Negative.  Psychiatric/Behavioral: Negative.      Per HPI unless specifically indicated above     Objective:    BP 116/72   Pulse 93   Ht 5\' 1"  (1.549 m)   Wt 187 lb 12.8 oz (85.2 kg)   LMP  (LMP Unknown)   SpO2 96%   BMI  35.48 kg/m   Wt Readings from Last 3 Encounters:  12/19/23 187 lb 12.8 oz (85.2 kg)  09/14/23 184 lb 9.6 oz (83.7 kg)  07/28/23 179 lb 3.2 oz (81.3 kg)    Physical Exam Vitals and nursing note reviewed.  Constitutional:      General: She is not in acute distress.    Appearance: Normal appearance. She is obese. She is not ill-appearing, toxic-appearing or diaphoretic.  HENT:     Head: Normocephalic and atraumatic.     Right Ear: Tympanic membrane, ear canal and external ear normal. There is no impacted cerumen.     Left Ear: Tympanic membrane, ear canal and external ear normal. There is no impacted cerumen.     Nose: Nose normal. No congestion or rhinorrhea.     Mouth/Throat:     Mouth: Mucous membranes are moist.     Pharynx: Oropharynx is clear. No oropharyngeal exudate or posterior oropharyngeal erythema.  Eyes:     General: No scleral icterus.       Right eye: No discharge.        Left eye: No discharge.     Extraocular Movements: Extraocular movements intact.     Conjunctiva/sclera: Conjunctivae normal.     Pupils: Pupils are equal, round, and reactive to light.  Neck:     Vascular: No carotid bruit.  Cardiovascular:     Rate and Rhythm: Normal rate and regular rhythm.     Pulses: Normal pulses.     Heart sounds: Murmur heard.     No friction rub. No gallop.  Pulmonary:     Effort: Pulmonary effort is normal. No respiratory distress.     Breath sounds: Normal breath sounds. No stridor. No wheezing, rhonchi or rales.  Chest:     Chest wall: No tenderness.  Abdominal:     General: Abdomen is flat. Bowel sounds are normal. There is no distension.     Palpations: Abdomen is soft. There is no mass.     Tenderness: There is no abdominal tenderness. There is no right CVA tenderness, left CVA tenderness, guarding or rebound.     Hernia: No hernia is present.  Genitourinary:    Comments: Breast and pelvic exams deferred with shared decision making Musculoskeletal:         General: No swelling, tenderness, deformity or signs of injury.     Cervical back: Normal range of motion and neck supple. No rigidity. No muscular tenderness.     Right lower leg: No edema.     Left lower leg: No edema.  Lymphadenopathy:     Cervical: No cervical adenopathy.  Skin:    General: Skin is warm and dry.     Capillary Refill: Capillary refill takes less than 2 seconds.     Coloration: Skin is not jaundiced or pale.     Findings: No bruising, erythema, lesion or rash.  Neurological:     General: No focal deficit present.     Mental Status: She is alert and oriented to person, place, and time. Mental status is at baseline.     Cranial Nerves: No cranial nerve deficit.     Sensory:  No sensory deficit.     Motor: No weakness.     Coordination: Coordination normal.     Gait: Gait normal.     Deep Tendon Reflexes: Reflexes normal.  Psychiatric:        Mood and Affect: Mood normal.        Behavior: Behavior normal.        Thought Content: Thought content normal.        Judgment: Judgment normal.     Results for orders placed or performed in visit on 12/19/23  Bayer DCA Hb A1c Waived   Collection Time: 12/19/23  1:54 PM  Result Value Ref Range   HB A1C (BAYER DCA - WAIVED) 6.3 (H) 4.8 - 5.6 %      Assessment & Plan:   Problem List Items Addressed This Visit       Nervous and Auditory   Parkinson disease (HCC)   Continues to have help. Continue to follow with neurology. Call with any concerns.       Dementia without behavioral disturbance (HCC)   Continues to have help. Continue to follow with neurology. Call with any concerns.       Relevant Medications   buPROPion  (WELLBUTRIN  XL) 150 MG 24 hr tablet   DULoxetine  (CYMBALTA ) 60 MG capsule   mirtazapine  (REMERON ) 15 MG tablet   QUEtiapine  (SEROQUEL ) 25 MG tablet   traZODone  (DESYREL ) 50 MG tablet     Musculoskeletal and Integument   Degenerative joint disease (DJD) of lumbar spine   Refill of tramadol  sent  today. Call with any concerns. Recheck 3 months.         Genitourinary   Benign hypertensive renal disease   Under good control on current regimen. Continue current regimen. Continue to monitor. Call with any concerns. Refills given. Labs drawn today.       Relevant Orders   Microalbumin, Urine Waived   CBC with Differential/Platelet   TSH     Other   Anxiety   Stable. Continue current regimen. Continue to monitor.       Relevant Medications   buPROPion  (WELLBUTRIN  XL) 150 MG 24 hr tablet   DULoxetine  (CYMBALTA ) 60 MG capsule   mirtazapine  (REMERON ) 15 MG tablet   traZODone  (DESYREL ) 50 MG tablet   Other Relevant Orders   CBC with Differential/Platelet   TSH   Hyperlipidemia   Under good control on current regimen. Continue current regimen. Continue to monitor. Call with any concerns. Refills given. Labs drawn today.        Relevant Medications   amLODipine  (NORVASC ) 2.5 MG tablet   atorvastatin  (LIPITOR) 40 MG tablet   losartan  (COZAAR ) 50 MG tablet   Other Relevant Orders   CBC with Differential/Platelet   Comprehensive metabolic panel with GFR   Lipid Panel w/o Chol/HDL Ratio   Vitamin D  deficiency disease   Under good control on current regimen. Continue current regimen. Continue to monitor. Call with any concerns. Refills given. Labs drawn today.       Relevant Orders   CBC with Differential/Platelet   VITAMIN D  25 Hydroxy (Vit-D Deficiency, Fractures)   MDD (major depressive disorder), recurrent episode, moderate (HCC)   Under good control on current regimen. Continue current regimen. Continue to monitor. Call with any concerns. Refills given.       Relevant Medications   buPROPion  (WELLBUTRIN  XL) 150 MG 24 hr tablet   DULoxetine  (CYMBALTA ) 60 MG capsule   mirtazapine  (REMERON ) 15 MG tablet   traZODone  (DESYREL ) 50  MG tablet   Other Relevant Orders   CBC with Differential/Platelet   TSH   Other Visit Diagnoses       Encounter for Medicare annual  wellness exam    -  Primary   Preventative care discussed today as below.     Hyperglycemia       Will check A1c. Await results.   Relevant Orders   Bayer DCA Hb A1c Waived (Completed)        Follow up plan: Return in about 3 months (around 03/19/2024).

## 2023-12-19 NOTE — Assessment & Plan Note (Signed)
Stable. Continue current regimen. Continue to monitor.  

## 2023-12-19 NOTE — Progress Notes (Signed)
 Subjective:   Cynthia Vaughan is a 83 y.o. female who presents for Medicare Annual (Subsequent) preventive examination.  Visit Complete: In person  Patient Medicare AWV questionnaire was completed by the patient on 12/09/23; I have confirmed that all information answered by patient is correct and no changes since this date.        Objective:    Today's Vitals   12/19/23 1328 12/19/23 1349  BP: (!) 147/87 116/72  Pulse: 93   SpO2: 96%   Weight: 187 lb 12.8 oz (85.2 kg)   Height: 5\' 1"  (1.549 m)    Body mass index is 35.48 kg/m.     06/22/2023    5:00 PM 06/21/2023   11:45 PM 06/20/2022   12:23 PM 06/18/2021   11:19 AM 06/15/2020    2:32 PM 05/12/2020   10:33 AM 06/13/2019    2:50 PM  Advanced Directives  Does Patient Have a Medical Advance Directive? No No No No No No No  Does patient want to make changes to medical advance directive?       Yes (MAU/Ambulatory/Procedural Areas - Information given)  Would patient like information on creating a medical advance directive? No - Patient declined  No - Patient declined   No - Patient declined     Current Medications (verified) Outpatient Encounter Medications as of 12/19/2023  Medication Sig   acetaminophen  (TYLENOL ) 500 MG tablet Take 500 mg by mouth every 6 (six) hours as needed.   aspirin  EC 81 MG tablet Take 81 mg by mouth daily.   Calcium  Carb-Cholecalciferol  (CALCIUM  600 + D PO) Take by mouth daily.   donepezil  (ARICEPT ) 10 MG tablet Take 10 mg by mouth at bedtime.   gabapentin  (NEURONTIN ) 100 MG capsule Take 100 mg in the morning and 100 mg in the afternoon and 300 mg at night   loperamide  (IMODIUM ) 2 MG capsule Take 1 capsule (2 mg total) by mouth every 6 (six) hours as needed for diarrhea or loose stools.   memantine  (NAMENDA ) 5 MG tablet Take 5 mg by mouth daily.   nystatin (MYCOSTATIN/NYSTOP) powder Apply 1 Application topically 3 (three) times daily.   [DISCONTINUED] amLODipine  (NORVASC ) 2.5 MG tablet Take 1  tablet (2.5 mg total) by mouth daily.   [DISCONTINUED] atorvastatin  (LIPITOR) 40 MG tablet Take 1 tablet (40 mg total) by mouth daily.   [DISCONTINUED] buPROPion  (WELLBUTRIN  XL) 150 MG 24 hr tablet Take 1 tablet by mouth daily.   [DISCONTINUED] DULoxetine  (CYMBALTA ) 60 MG capsule Take 1 capsule (60 mg total) by mouth daily.   [DISCONTINUED] fluticasone  (FLONASE ) 50 MCG/ACT nasal spray Place 2 sprays into both nostrils daily.   [DISCONTINUED] loratadine  (CLARITIN ) 10 MG tablet Take 1 tablet (10 mg total) by mouth daily.   [DISCONTINUED] losartan  (COZAAR ) 50 MG tablet Take 1 tablet (50 mg total) by mouth daily.   [DISCONTINUED] mirtazapine  (REMERON ) 15 MG tablet Take 1 tablet (15 mg total) by mouth at bedtime.   [DISCONTINUED] omeprazole  (PRILOSEC) 20 MG capsule Take 1 capsule (20 mg total) by mouth daily.   [DISCONTINUED] QUEtiapine  (SEROQUEL ) 25 MG tablet Take 1 tablet (25 mg total) by mouth at bedtime.   [DISCONTINUED] traMADol  (ULTRAM ) 50 MG tablet Take 1 tablet (50 mg total) by mouth 2 (two) times daily as needed.   [DISCONTINUED] traZODone  (DESYREL ) 50 MG tablet Take 25 mg by mouth at bedtime as needed.   amLODipine  (NORVASC ) 2.5 MG tablet Take 1 tablet (2.5 mg total) by mouth daily.   atorvastatin  (LIPITOR)  40 MG tablet Take 1 tablet (40 mg total) by mouth daily.   buPROPion  (WELLBUTRIN  XL) 150 MG 24 hr tablet Take 1 tablet (150 mg total) by mouth daily.   DULoxetine  (CYMBALTA ) 60 MG capsule Take 1 capsule (60 mg total) by mouth daily.   fluticasone  (FLONASE ) 50 MCG/ACT nasal spray Place 2 sprays into both nostrils daily.   loratadine  (CLARITIN ) 10 MG tablet Take 1 tablet (10 mg total) by mouth daily.   losartan  (COZAAR ) 50 MG tablet Take 1 tablet (50 mg total) by mouth daily.   mirtazapine  (REMERON ) 15 MG tablet Take 1 tablet (15 mg total) by mouth at bedtime.   omeprazole  (PRILOSEC) 20 MG capsule Take 1 capsule (20 mg total) by mouth daily.   QUEtiapine  (SEROQUEL ) 25 MG tablet Take 1  tablet (25 mg total) by mouth at bedtime.   traMADol  (ULTRAM ) 50 MG tablet Take 1 tablet (50 mg total) by mouth 2 (two) times daily as needed.   traZODone  (DESYREL ) 50 MG tablet Take 0.5 tablets (25 mg total) by mouth at bedtime as needed.   No facility-administered encounter medications on file as of 12/19/2023.    Allergies (verified) Codeine and Lisinopril   History: Past Medical History:  Diagnosis Date   Actinic keratosis    Allergy    Anxiety    Atypical mole 04/14/2021   ATYPICAL JUNCTIONAL LENTIGINOUS MELANOCYTIC PROLIFERATION, R lower elbow, exc 07/05/2021   Cancer (HCC)    SKIN   CKD (chronic kidney disease) stage 2, GFR 60-89 ml/min    Depression    Edema    LEGS/FEET   GERD (gastroesophageal reflux disease)    Heart murmur    Hx of basal cell carcinoma 10/20/2009   R lateral infraorbital   Hx of basal cell carcinoma 06/30/2015   R infra orbital scar   Hyperlipidemia    Hypertension    Hypopotassemia    Microalbuminuria    Osteoarthritis    feet, legs   Osteopenia    [facility] spotted fever) 05/10/2016   Urinary incontinence    Vitamin D  deficiency disease    Wears dentures    partial lower   Wheezing    Past Surgical History:  Procedure Laterality Date   BLADDER SURGERY     x 2   BUNIONECTOMY     CATARACT EXTRACTION W/PHACO Right 11/23/2015   Procedure: CATARACT EXTRACTION PHACO AND INTRAOCULAR LENS PLACEMENT (IOC);  Surgeon: Steven Dingeldein, MD;  Location: [facility] ORS;  Service: Ophthalmology;  Laterality: Right;  US     1:30.9 AP%  26.9 CDE   42.29 flud casette lot # [id number]  exp05/31/2018   CATARACT EXTRACTION W/PHACO Left 05/12/2020   Procedure: CATARACT EXTRACTION PHACO AND INTRAOCULAR LENS PLACEMENT (IOC) LEFT 15.24 01:28.8;  Surgeon: Clair Crews, MD;  Location: [facility];  Service: Ophthalmology;  Laterality: Left;   COLONOSCOPY     TOTAL ABDOMINAL HYSTERECTOMY  08/22/2002   Total (due to bladder surgery)   Family  History  Problem Relation Age of Onset   AAA (abdominal aortic aneurysm) Mother    Arthritis Brother    Heart disease Brother    Stroke Daughter    Diabetes Son    Stroke Son    Seizures Son    Diabetes Brother    Heart disease Brother        7 total heart attacks   Hyperlipidemia Brother    Hypertension Brother    Dementia Brother    AAA (abdominal aortic aneurysm) Brother  Stroke Son    Heart disease Brother    Dementia Sister    Social History   Socioeconomic History   Marital status: Married    Spouse name: hal   Number of children: 3   Years of education: Not on file   Highest education level: GED or equivalent  Occupational History   Occupation: retired   Tobacco Use   Smoking status: Former    Current packs/day: 0.00    Types: Cigarettes    Quit date: 1990    Years since quitting: 35.3   Smokeless tobacco: Never   Tobacco comments:       Vaping Use   Vaping status: Never Used  Substance and Sexual Activity   Alcohol use: Never   Drug use: No   Sexual activity: Not Currently  Other Topics Concern   Not on file  Social History Narrative   Not on file   Social Drivers of Health   Financial Resource Strain: Low Risk  (06/13/2023)   Overall Financial Resource Strain (CARDIA)    Difficulty of Paying Living Expenses: Not hard at all  Food Insecurity: No Food Insecurity (12/19/2023)   Hunger Vital Sign    Worried About Running Out of Food in the Last Year: Never true    Ran Out of Food in the Last Year: Never true  Transportation Needs: No Transportation Needs (12/19/2023)   PRAPARE - Administrator, Civil Service (Medical): No    Lack of Transportation (Non-Medical): No  Physical Activity: Unknown (06/13/2023)   Exercise Vital Sign    Days of Exercise per Week: 0 days    Minutes of Exercise per Session: Not on file  Recent Concern: Physical Activity - Inactive (06/13/2023)   Exercise Vital Sign    Days of Exercise per Week: 0 days     Minutes of Exercise per Session: 0 min  Stress: No Stress Concern Present (06/13/2023)   [employer] of Occupational Health - Occupational Stress Questionnaire    Feeling of Stress : Only a little  Social Connections: Moderately Isolated (06/13/2023)   Social Connection and Isolation Panel [NHANES]    Frequency of Communication with Friends and Family: Once a week    Frequency of Social Gatherings with Friends and Family: Never    Attends Religious Services: More than 4 times per year    Active Member of [employer] or Organizations: No    Attends Engineer, structural: Not on file    Marital Status: Living with partner    Tobacco Counseling Counseling given: Not Answered Tobacco comments:     Clinical Intake:                        Activities of Daily Living    12/19/2023    1:41 PM 06/22/2023    5:00 PM  In your present state of health, do you have any difficulty performing the following activities:  Hearing? 0 0  Vision? 0 0  Difficulty concentrating or making decisions? 1 0  Walking or climbing stairs? 1   Dressing or bathing? 1   Doing errands, shopping? 1 1    Patient Care Team: Solomon Dupre, DO as PCP - General (Family Medicine) Grafton Lawrence, RN (Inactive) as [facility]BCI Care Management  Indicate any recent Medical Services you may have received from other than [facility] providers in the past year (date may be approximate).     Assessment:   This is a  routine wellness examination for Danel.  Hearing/Vision screen No results found.   Goals Addressed   None   Depression Screen    12/19/2023    1:26 PM 09/14/2023    1:46 PM 07/28/2023    2:36 PM 07/07/2023    1:14 PM 06/13/2023    1:47 PM 05/02/2023    3:33 PM 01/18/2023   11:03 AM  PHQ 2/9 Scores  PHQ - 2 Score 3 4 3 3 3 5 3   PHQ- 9 Score 12 12 8 11 11 17 10     Fall Risk    12/19/2023    1:42 PM 09/14/2023    1:46 PM 07/28/2023    2:36 PM 06/13/2023    1:46 PM 05/02/2023    3:33 PM   Fall Risk   Falls in the past year? 1 1 1 1  0  Number falls in past yr: 0 1 1 0 0  Injury with Fall? 0 0 0 0 0  Risk for fall due to : Impaired balance/gait History of fall(s) History of fall(s) No Fall Risks No Fall Risks  Follow up Falls evaluation completed Falls evaluation completed Falls evaluation completed Falls evaluation completed Falls evaluation completed    MEDICARE RISK AT HOME: Medicare Risk at Home Any stairs in or around the home?: No If so, are there any without handrails?: Yes Home free of loose throw rugs in walkways, pet beds, electrical cords, etc?: No Adequate lighting in your home to reduce risk of falls?: Yes Life alert?: No Use of a cane, walker or w/c?: No Grab bars in the bathroom?: Yes Shower chair or bench in shower?: Yes Elevated toilet seat or a handicapped toilet?: No  TIMED UP AND GO:  Was the test performed?  Yes  Length of time to ambulate 10 feet: 30 sec Gait unsteady with use of assistive device, provider informed and education provided.     Cognitive Function:    12/04/2018    1:17 PM 12/18/2017    2:12 PM  MMSE - Mini Mental State Exam  Orientation to time 3 3  Orientation to Place 5 4  Registration 3 3  Attention/ Calculation 2 5  Recall 0 0  Language- name 2 objects 2 2  Language- repeat 1 1  Language- follow 3 step command 3 3  Language- read & follow direction 1 1  Write a sentence 1 1  Copy design 1 1  Total score 22 24        12/19/2023    1:42 PM 06/20/2022   11:36 AM 06/18/2021   11:22 AM 06/15/2020    2:36 PM 06/13/2019    2:32 PM  6CIT Screen  What Year? 4 points 4 points 0 points 0 points 0 points  What month? 3 points 3 points 3 points 0 points 0 points  What time? 3 points 3 points 0 points 0 points 0 points  Count back from 20 0 points 4 points 0 points 0 points 0 points  Months in reverse 4 points 4 points 4 points 4 points 0 points  Repeat phrase 10 points 10 points 10 points 6 points 4 points  Total  Score 24 points 28 points 17 points 10 points 4 points    Immunizations Immunization History  Administered Date(s) Administered   Fluad Quad(high Dose 65+) 05/16/2019, 06/15/2020, 10/01/2021   Fluad Trivalent(High Dose 65+) 05/02/2023   Influenza, High Dose Seasonal PF 06/21/2017, 06/01/2018   Influenza,inj,Quad PF,6+ Mos 05/21/2015   Influenza,inj,quad, With Preservative  06/22/2016   Influenza-Unspecified 06/11/2014, 05/21/2015, 07/04/2016, 06/10/2022   PFIZER(Purple Top)SARS-COV-2 Vaccination 10/14/2019, 11/04/2019, 08/25/2020   Pneumococcal Conjugate-13 07/07/2015   Pneumococcal Polysaccharide-23 02/15/2006, 11/04/2010   Td 02/15/2006, 06/01/2018   Zoster, Live 02/05/2008    TDAP status: Up to date  Flu Vaccine status: Up to date  Pneumococcal vaccine status: Up to date  Covid-19 vaccine status: Declined, Education has been provided regarding the importance of this vaccine but patient still declined. Advised may receive this vaccine at local pharmacy or Health Dept.or vaccine clinic. Aware to provide a copy of the vaccination record if obtained from local pharmacy or Health Dept. Verbalized acceptance and understanding.  Qualifies for Shingles Vaccine? Yes  Zostavax completed No     Screening Tests Health Maintenance  Topic Date Due   Zoster Vaccines- Shingrix (1 of 2) 07/06/2024 (Originally 01/22/1960)   INFLUENZA VACCINE  03/22/2024   Medicare Annual Wellness (AWV)  12/18/2024   DTaP/Tdap/Td (3 - Tdap) 06/01/2028   Pneumonia Vaccine 29+ Years old  Completed   DEXA SCAN  Addressed   HPV VACCINES  Aged Out   Meningococcal B Vaccine  Aged Out   COVID-19 Vaccine  Discontinued   Hepatitis C Screening  Discontinued    Health Maintenance  There are no preventive care reminders to display for this patient.   Colorectal cancer screening: No longer required.   Mammogram status: No longer required due to Age.  Bone Density status: Completed 04/24/14. Results reflect: Bone  density results: NORMAL. Repeat every PRN years.  Lung Cancer Screening: (Low Dose CT Chest recommended if Age 49-80 years, 20 pack-year currently smoking OR have quit w/in 15years.) does not qualify.    Additional Screening:  Hepatitis C Screening: does not qualify;  Dental Screening: Recommended annual dental exams for proper oral hygiene   Community Resource Referral / Chronic Care Management: CRR required this visit?  No   CCM required this visit?  No     Plan:     I have personally reviewed and noted the following in the patient's chart:   Medical and social history Use of alcohol, tobacco or illicit drugs  Current medications and supplements including opioid prescriptions. Patient is currently taking opioid prescriptions. Information provided to patient regarding non-opioid alternatives. Patient advised to discuss non-opioid treatment plan with their provider. Functional ability and status Nutritional status Physical activity Advanced directives List of other physicians Hospitalizations, surgeries, and ER visits in previous 12 months Vitals Screenings to include cognitive, depression, and falls Referrals and appointments  In addition, I have reviewed and discussed with patient certain preventive protocols, quality metrics, and best practice recommendations. A written personalized care plan for preventive services as well as general preventive health recommendations were provided to patient.     Terre Ferri, DO   12/19/2023   After Visit Summary: (In Person-Printed) AVS printed and given to the patient  Nurse Notes: N/A

## 2023-12-20 ENCOUNTER — Encounter: Payer: Self-pay | Admitting: Family Medicine

## 2023-12-20 LAB — COMPREHENSIVE METABOLIC PANEL WITH GFR
ALT: 33 IU/L — ABNORMAL HIGH (ref 0–32)
AST: 32 IU/L (ref 0–40)
Albumin: 4.3 g/dL (ref 3.7–4.7)
Alkaline Phosphatase: 76 IU/L (ref 44–121)
BUN/Creatinine Ratio: 18 (ref 12–28)
BUN: 16 mg/dL (ref 8–27)
Bilirubin Total: 0.2 mg/dL (ref 0.0–1.2)
CO2: 21 mmol/L (ref 20–29)
Calcium: 9.5 mg/dL (ref 8.7–10.3)
Chloride: 102 mmol/L (ref 96–106)
Creatinine, Ser: 0.87 mg/dL (ref 0.57–1.00)
Globulin, Total: 2.5 g/dL (ref 1.5–4.5)
Glucose: 211 mg/dL — ABNORMAL HIGH (ref 70–99)
Potassium: 4.4 mmol/L (ref 3.5–5.2)
Sodium: 140 mmol/L (ref 134–144)
Total Protein: 6.8 g/dL (ref 6.0–8.5)
eGFR: 67 mL/min/{1.73_m2} (ref >59)

## 2023-12-20 LAB — CBC WITH DIFFERENTIAL/PLATELET
Basophils Absolute: 0 10*3/uL (ref 0.0–0.2)
Basos: 1 %
EOS (ABSOLUTE): 0.2 10*3/uL (ref 0.0–0.4)
Eos: 4 %
Hematocrit: 42.9 % (ref 34.0–46.6)
Hemoglobin: 14.2 g/dL (ref 11.1–15.9)
Immature Grans (Abs): 0 10*3/uL (ref 0.0–0.1)
Immature Granulocytes: 1 %
Lymphocytes Absolute: 1.7 10*3/uL (ref 0.7–3.1)
Lymphs: 27 %
MCH: 31.7 pg (ref 26.6–33.0)
MCHC: 33.1 g/dL (ref 31.5–35.7)
MCV: 96 fL (ref 79–97)
Monocytes Absolute: 0.5 10*3/uL (ref 0.1–0.9)
Monocytes: 8 %
Neutrophils Absolute: 3.7 10*3/uL (ref 1.4–7.0)
Neutrophils: 59 %
Platelets: 215 10*3/uL (ref 150–450)
RBC: 4.48 x10E6/uL (ref 3.77–5.28)
RDW: 12.6 % (ref 11.7–15.4)
WBC: 6.1 10*3/uL (ref 3.4–10.8)

## 2023-12-20 LAB — VITAMIN D 25 HYDROXY (VIT D DEFICIENCY, FRACTURES): Vit D, 25-Hydroxy: 39.5 ng/mL (ref 30.0–100.0)

## 2023-12-20 LAB — LIPID PANEL W/O CHOL/HDL RATIO
Cholesterol, Total: 170 mg/dL (ref 100–199)
HDL: 64 mg/dL (ref >39)
LDL Chol Calc (NIH): 73 mg/dL (ref 0–99)
Triglycerides: 198 mg/dL — ABNORMAL HIGH (ref 0–149)
VLDL Cholesterol Cal: 33 mg/dL (ref 5–40)

## 2023-12-20 LAB — TSH: TSH: 5.27 u[IU]/mL — ABNORMAL HIGH (ref 0.450–4.500)

## 2023-12-21 DIAGNOSIS — R739 Hyperglycemia, unspecified: Secondary | ICD-10-CM | POA: Diagnosis not present

## 2023-12-21 LAB — MICROALBUMIN, URINE WAIVED
Creatinine, Urine Waived: 50 mg/dL (ref 10–300)
Microalb, Ur Waived: 150 mg/L — ABNORMAL HIGH (ref 0–19)
Microalb/Creat Ratio: >300 mg/g — ABNORMAL HIGH (ref <30)

## 2023-12-21 NOTE — Addendum Note (Signed)
 Addended by: Delphia Ficks on: 12/21/2023 01:37 PM   Modules accepted: Orders

## 2023-12-21 NOTE — Addendum Note (Signed)
 Addended by: Delphia Ficks on: 12/21/2023 01:45 PM   Modules accepted: Orders

## 2023-12-25 DIAGNOSIS — H26491 Other secondary cataract, right eye: Secondary | ICD-10-CM | POA: Diagnosis not present

## 2023-12-25 DIAGNOSIS — H43813 Vitreous degeneration, bilateral: Secondary | ICD-10-CM | POA: Diagnosis not present

## 2023-12-25 DIAGNOSIS — Z961 Presence of intraocular lens: Secondary | ICD-10-CM | POA: Diagnosis not present

## 2024-03-20 ENCOUNTER — Ambulatory Visit (INDEPENDENT_AMBULATORY_CARE_PROVIDER_SITE_OTHER): Admitting: Family Medicine

## 2024-03-20 ENCOUNTER — Encounter: Payer: Self-pay | Admitting: Family Medicine

## 2024-03-20 VITALS — BP 132/84 | HR 94 | Temp 98.1°F | Resp 16 | Ht 60.98 in | Wt 182.8 lb

## 2024-03-20 DIAGNOSIS — M4726 Other spondylosis with radiculopathy, lumbar region: Secondary | ICD-10-CM | POA: Diagnosis not present

## 2024-03-20 DIAGNOSIS — F419 Anxiety disorder, unspecified: Secondary | ICD-10-CM | POA: Diagnosis not present

## 2024-03-20 MED ORDER — ALBUTEROL SULFATE HFA 108 (90 BASE) MCG/ACT IN AERS: 2.0000 | INHALATION_SPRAY | Freq: Every evening | RESPIRATORY_TRACT | 0 refills | Status: DC | PRN

## 2024-03-20 NOTE — Assessment & Plan Note (Signed)
Under good control on current regimen. Continue current regimen. Continue to monitor. Call with any concerns. Refills up to date.   

## 2024-03-20 NOTE — Progress Notes (Signed)
 BP 132/84 (BP Location: Left Arm, Patient Position: Sitting, Cuff Size: Large)   Pulse 94   Temp 98.1 F (36.7 C) (Oral)   Resp 16   Ht 5' 0.98 (1.549 m)   Wt 182 lb 12.8 oz (82.9 kg)   LMP  (LMP Unknown)   SpO2 95%   BMI 34.56 kg/m    Subjective:    Patient ID: Cynthia Vaughan, female    DOB: 1941/02/25, 83 y.o.   MRN: [id number]  HPI: Cynthia Vaughan is a 83 y.o. female  Chief Complaint  Patient presents with   Anxiety   Pain   ANXIETY/STRESS Duration: chronic Status:stable Anxious mood: yes  Excessive worrying: no Irritability: yes  Sweating: no Nausea: no Palpitations:no Hyperventilation: no Panic attacks: no Agoraphobia: no  Obscessions/compulsions: no Depressed mood: yes    03/20/2024    3:59 PM 12/19/2023    1:26 PM 09/14/2023    1:46 PM 07/28/2023    2:36 PM 07/07/2023    1:14 PM  Depression screen PHQ 2/9  Decreased Interest 3 2 2 2 2   Down, Depressed, Hopeless 2 1 2 1 1   PHQ - 2 Score 5 3 4 3 3   Altered sleeping 1 1 2 1 1   Tired, decreased energy 2 2 2 2 3   Change in appetite 2 2 2 1 2   Feeling bad or failure about yourself  1 1 1  0 1  Trouble concentrating 1 1 1 1 1   Moving slowly or fidgety/restless 0 1 0 0 0  Suicidal thoughts 1 1 0 0 0  PHQ-9 Score 13 12 12 8 11   Difficult doing work/chores Very difficult Very difficult Very difficult Somewhat difficult Very difficult      03/20/2024    4:01 PM 12/19/2023    1:27 PM 09/14/2023    1:47 PM 07/28/2023    2:36 PM  GAD 7 : Generalized Anxiety Score  Nervous, Anxious, on Edge 1 1 2 1   Control/stop worrying 1 1 1 1   Worry too much - different things 1 1 1 1   Trouble relaxing 1 1 1  0  Restless 0 1 0 0  Easily annoyed or irritable 1 1 0 0  Afraid - awful might happen 1 1 1 1   Total GAD 7 Score 6 7 6 4   Anxiety Difficulty Somewhat difficult Somewhat difficult  Somewhat difficult   Anhedonia: no Weight changes: no Insomnia: no   Hypersomnia: yes Fatigue/loss of energy: yes Feelings of  worthlessness: no Feelings of guilt: no Impaired concentration/indecisiveness: no Suicidal ideations: no  Crying spells: no Recent Stressors/Life Changes: no   Relationship problems: no   Family stress: no     Financial stress: no    Job stress: no    Recent death/loss: no  CHRONIC PAIN- just taking the tramadol  when she's going over to see her daughter at the rest home or if she's walking a lot. Taking occasionally Present dose: 10 Morphine equivalents Pain control status: stable Duration: chronic Location: low back and hip pain Quality: aching Current Pain Level: mild Previous Pain Level: moderate Breakthrough pain: no Benefit from narcotic medications: yes What Activities task can be accomplished with current medication? Able to go out and visit her daughter Interested in weaning off narcotics:no   Stool softners/OTC fiber: yes  Previous pain specialty evaluation: no Non-narcotic analgesic meds: yes Narcotic contract: yes  Relevant past medical, surgical, family and social history reviewed and updated as indicated. Interim medical history since our last visit  reviewed. Allergies and medications reviewed and updated.  Review of Systems  Constitutional: Negative.   Respiratory: Negative.    Cardiovascular: Negative.   Musculoskeletal: Negative.   Neurological: Negative.   Psychiatric/Behavioral: Negative.      Per HPI unless specifically indicated above     Objective:    BP 132/84 (BP Location: Left Arm, Patient Position: Sitting, Cuff Size: Large)   Pulse 94   Temp 98.1 F (36.7 C) (Oral)   Resp 16   Ht 5' 0.98 (1.549 m)   Wt 182 lb 12.8 oz (82.9 kg)   LMP  (LMP Unknown)   SpO2 95%   BMI 34.56 kg/m   Wt Readings from Last 3 Encounters:  03/20/24 182 lb 12.8 oz (82.9 kg)  12/19/23 187 lb 12.8 oz (85.2 kg)  09/14/23 184 lb 9.6 oz (83.7 kg)    Physical Exam Vitals and nursing note reviewed.  Constitutional:      General: She is not in acute distress.     Appearance: Normal appearance. She is not ill-appearing, toxic-appearing or diaphoretic.  HENT:     Head: Normocephalic and atraumatic.     Right Ear: External ear normal.     Left Ear: External ear normal.     Nose: Nose normal.     Mouth/Throat:     Mouth: Mucous membranes are moist.     Pharynx: Oropharynx is clear.  Eyes:     General: No scleral icterus.       Right eye: No discharge.        Left eye: No discharge.     Extraocular Movements: Extraocular movements intact.     Conjunctiva/sclera: Conjunctivae normal.     Pupils: Pupils are equal, round, and reactive to light.  Cardiovascular:     Rate and Rhythm: Normal rate and regular rhythm.     Pulses: Normal pulses.     Heart sounds: Murmur heard.     No friction rub. No gallop.  Pulmonary:     Effort: Pulmonary effort is normal. No respiratory distress.     Breath sounds: Normal breath sounds. No stridor. No wheezing, rhonchi or rales.  Chest:     Chest wall: No tenderness.  Musculoskeletal:        General: Normal range of motion.     Cervical back: Normal range of motion and neck supple.  Skin:    General: Skin is warm and dry.     Capillary Refill: Capillary refill takes less than 2 seconds.     Coloration: Skin is not jaundiced or pale.     Findings: No bruising, erythema, lesion or rash.  Neurological:     General: No focal deficit present.     Mental Status: She is alert and oriented to person, place, and time. Mental status is at baseline.  Psychiatric:        Mood and Affect: Mood normal.        Behavior: Behavior normal.        Thought Content: Thought content normal.        Judgment: Judgment normal.     Results for orders placed or performed in visit on 12/19/23  Bayer DCA Hb A1c Waived   Collection Time: 12/19/23  1:54 PM  Result Value Ref Range   HB A1C (BAYER DCA - WAIVED) 6.3 (H) 4.8 - 5.6 %  CBC with Differential/Platelet   Collection Time: 12/19/23  1:55 PM  Result Value Ref Range   WBC  6.1 3.4 -  10.8 x10E3/uL   RBC 4.48 3.77 - 5.28 x10E6/uL   Hemoglobin 14.2 11.1 - 15.9 g/dL   Hematocrit 57.0 65.9 - 46.6 %   MCV 96 79 - 97 fL   MCH 31.7 26.6 - 33.0 pg   MCHC 33.1 31.5 - 35.7 g/dL   RDW 87.3 88.2 - 84.5 %   Platelets 215 150 - 450 x10E3/uL   Neutrophils 59 Not Estab. %   Lymphs 27 Not Estab. %   Monocytes 8 Not Estab. %   Eos 4 Not Estab. %   Basos 1 Not Estab. %   Neutrophils Absolute 3.7 1.4 - 7.0 x10E3/uL   Lymphocytes Absolute 1.7 0.7 - 3.1 x10E3/uL   Monocytes Absolute 0.5 0.1 - 0.9 x10E3/uL   EOS (ABSOLUTE) 0.2 0.0 - 0.4 x10E3/uL   Basophils Absolute 0.0 0.0 - 0.2 x10E3/uL   Immature Granulocytes 1 Not Estab. %   Immature Grans (Abs) 0.0 0.0 - 0.1 x10E3/uL  Comprehensive metabolic panel with GFR   Collection Time: 12/19/23  1:55 PM  Result Value Ref Range   Glucose 211 (H) 70 - 99 mg/dL   BUN 16 8 - 27 mg/dL   Creatinine, Ser 9.12 0.57 - 1.00 mg/dL   eGFR 67 >40 fO/fpw/8.26   BUN/Creatinine Ratio 18 12 - 28   Sodium 140 134 - 144 mmol/L   Potassium 4.4 3.5 - 5.2 mmol/L   Chloride 102 96 - 106 mmol/L   CO2 21 20 - 29 mmol/L   Calcium  9.5 8.7 - 10.3 mg/dL   Total Protein 6.8 6.0 - 8.5 g/dL   Albumin 4.3 3.7 - 4.7 g/dL   Globulin, Total 2.5 1.5 - 4.5 g/dL   Bilirubin Total 0.2 0.0 - 1.2 mg/dL   Alkaline Phosphatase 76 44 - 121 IU/L   AST 32 0 - 40 IU/L   ALT 33 (H) 0 - 32 IU/L  Lipid Panel w/o Chol/HDL Ratio   Collection Time: 12/19/23  1:55 PM  Result Value Ref Range   Cholesterol, Total 170 100 - 199 mg/dL   Triglycerides 801 (H) 0 - 149 mg/dL   HDL 64 >60 mg/dL   VLDL Cholesterol Cal 33 5 - 40 mg/dL   LDL Chol Calc (NIH) 73 0 - 99 mg/dL  TSH   Collection Time: 12/19/23  1:55 PM  Result Value Ref Range   TSH 5.270 (H) 0.450 - 4.500 uIU/mL  VITAMIN D  25 Hydroxy (Vit-D Deficiency, Fractures)   Collection Time: 12/19/23  1:55 PM  Result Value Ref Range   Vit D, 25-Hydroxy 39.5 30.0 - 100.0 ng/mL  Microalbumin, Urine Waived (STAT)    Collection Time: 12/21/23  1:46 PM  Result Value Ref Range   Microalb, Ur Waived 150 (H) 0 - 19 mg/L   Creatinine, Urine Waived 50 10 - 300 mg/dL   Microalb/Creat Ratio >300 (H) <30 mg/g      Assessment & Plan:   Problem List Items Addressed This Visit       Musculoskeletal and Integument   Degenerative joint disease (DJD) of lumbar spine   Has 2 refills of her tramadol  on file. Using it just for going out. Continues to be beneficial. Continue current regimen. Call with any concerns. Follow up 3 months.         Other   Anxiety - Primary   Under good control on current regimen. Continue current regimen. Continue to monitor. Call with any concerns. Refills up to date.  Follow up plan: Return in about 3 months (around 06/20/2024).

## 2024-03-20 NOTE — Assessment & Plan Note (Signed)
 Has 2 refills of her tramadol  on file. Using it just for going out. Continues to be beneficial. Continue current regimen. Call with any concerns. Follow up 3 months.

## 2024-04-12 ENCOUNTER — Other Ambulatory Visit: Payer: Self-pay | Admitting: Family Medicine

## 2024-04-12 NOTE — Telephone Encounter (Signed)
 Requested medications are due for refill today.  yes  Requested medications are on the active medications list.  yes  Last refill. 03/20/2024 8 0 rf  Future visit scheduled.   yes  Notes to clinic.  New medication to this pt.    Requested Prescriptions  Pending Prescriptions Disp Refills   albuterol  (VENTOLIN  HFA) 108 (90 Base) MCG/ACT inhaler [Pharmacy Med Name: ALBUTEROL  HFA (VENTOLIN ) INH] 18 each     Sig: Inhale 2 puffs into the lungs at bedtime as needed for wheezing or shortness of breath.     Pulmonology:  Beta Agonists 2 Passed - 04/12/2024  3:42 PM      Passed - Last BP in normal range    BP Readings from Last 1 Encounters:  03/20/24 132/84         Passed - Last Heart Rate in normal range    Pulse Readings from Last 1 Encounters:  03/20/24 94         Passed - Valid encounter within last 12 months    Recent Outpatient Visits           3 weeks ago Anxiety   [facility] [place], Megan P, DO   3 months ago Encounter for [employer] annual wellness exam   [facility] Vicci Duwaine SQUIBB, DO       Future Appointments             In 3 months Jackquline Sawyer, MD [facility]

## 2024-05-20 ENCOUNTER — Encounter: Payer: Self-pay | Admitting: Podiatry

## 2024-05-20 ENCOUNTER — Ambulatory Visit (INDEPENDENT_AMBULATORY_CARE_PROVIDER_SITE_OTHER): Admitting: Podiatry

## 2024-05-20 VITALS — Ht 61.0 in | Wt 182.8 lb

## 2024-05-20 DIAGNOSIS — L600 Ingrowing nail: Secondary | ICD-10-CM

## 2024-05-21 NOTE — Progress Notes (Signed)
 Chief Complaint  Patient presents with   Nail Problem    Pt is here due to right great toenail issue, husband states while clipping her toenails the big toenail lifted up, thinks the toenail needs to come off.    HPI: 83 y.o. female presenting today for evaluation of a partially detached toenail to the right great toe.  The husband was concerned because the toenail was lifting off.  No history of injury.  Past Medical History:  Diagnosis Date   Actinic keratosis    Allergy    Anxiety    Atypical mole 04/14/2021   ATYPICAL JUNCTIONAL LENTIGINOUS MELANOCYTIC PROLIFERATION, R lower elbow, exc 07/05/2021   Cancer (HCC)    SKIN   CKD (chronic kidney disease) stage 2, GFR 60-89 ml/min    Depression    Edema    LEGS/FEET   GERD (gastroesophageal reflux disease)    Heart murmur    Hx of basal cell carcinoma 10/20/2009   R lateral infraorbital   Hx of basal cell carcinoma 06/30/2015   R infra orbital scar   Hyperlipidemia    Hypertension    Hypopotassemia    Microalbuminuria    Osteoarthritis    feet, legs   Osteopenia    [facility] spotted fever) 05/10/2016   Urinary incontinence    Vitamin D  deficiency disease    Wears dentures    partial lower   Wheezing     Past Surgical History:  Procedure Laterality Date   BLADDER SURGERY     x 2   BUNIONECTOMY     CATARACT EXTRACTION W/PHACO Right 11/23/2015   Procedure: CATARACT EXTRACTION PHACO AND INTRAOCULAR LENS PLACEMENT (IOC);  Surgeon: Steven Dingeldein, MD;  Location: [facility] ORS;  Service: Ophthalmology;  Laterality: Right;  US     1:30.9 AP%  26.9 CDE   42.29 flud casette lot # [id number]  exp05/31/2018   CATARACT EXTRACTION W/PHACO Left 05/12/2020   Procedure: CATARACT EXTRACTION PHACO AND INTRAOCULAR LENS PLACEMENT (IOC) LEFT 15.24 01:28.8;  Surgeon: Jaye Fallow, MD;  Location: [facility];  Service: Ophthalmology;  Laterality: Left;   COLONOSCOPY     TOTAL ABDOMINAL HYSTERECTOMY  08/22/2002    Total (due to bladder surgery)    Allergies  Allergen Reactions   Codeine Itching   Lisinopril Cough     Physical Exam: General: The patient is alert and oriented x3 in no acute distress.  Dermatology: Skin is warm, dry and supple bilateral lower extremities.  Partially detached but stable nail plate noted to the right hallux with associated tenderness.  A portion of the nail appears to be ingrowing no drainage.  No concern for infection.  Vascular: Palpable pedal pulses bilaterally. Capillary refill within normal limits.  No appreciable edema.  No erythema.  Neurological: Grossly intact via light touch  Musculoskeletal Exam: No pedal deformities noted   Assessment/Plan of Care: 1. Partially detached ingrown toenail right great toe  -Patient evaluated -Decision made to completely avulse the nail plate and allow a new nail to grow in which was explained to the patient in detail. Agrees for total temporary nail avulsion.   -The toe was prepped in aseptic manner and digital block performed using 3 mL of 2% lidocaine  plain.  The nail was avulsed in its entirety followed by dressings which were applied.  Post care instructions were also provided -Return to clinic PRN        Thresa EMERSON Sar, DPM [facility]  Dr. Thresa HERO.  Janit, DPM    [address] [place], [place] [address]                Office [phone]  Fax [phone]

## 2024-06-11 ENCOUNTER — Other Ambulatory Visit: Payer: Self-pay

## 2024-06-11 MED ORDER — TRAZODONE HCL 50 MG PO TABS: 25.0000 mg | ORAL_TABLET | Freq: Every evening | ORAL | 0 refills | Status: DC | PRN

## 2024-06-21 ENCOUNTER — Ambulatory Visit (INDEPENDENT_AMBULATORY_CARE_PROVIDER_SITE_OTHER): Admitting: Family Medicine

## 2024-06-21 ENCOUNTER — Encounter: Payer: Self-pay | Admitting: Family Medicine

## 2024-06-21 VITALS — BP 134/76 | HR 93 | Temp 98.0°F | Ht 61.0 in

## 2024-06-21 DIAGNOSIS — E782 Mixed hyperlipidemia: Secondary | ICD-10-CM | POA: Diagnosis not present

## 2024-06-21 DIAGNOSIS — M150 Primary generalized (osteo)arthritis: Secondary | ICD-10-CM | POA: Diagnosis not present

## 2024-06-21 DIAGNOSIS — F419 Anxiety disorder, unspecified: Secondary | ICD-10-CM | POA: Diagnosis not present

## 2024-06-21 DIAGNOSIS — E559 Vitamin D deficiency, unspecified: Secondary | ICD-10-CM | POA: Diagnosis not present

## 2024-06-21 DIAGNOSIS — Z23 Encounter for immunization: Secondary | ICD-10-CM

## 2024-06-21 DIAGNOSIS — F331 Major depressive disorder, recurrent, moderate: Secondary | ICD-10-CM

## 2024-06-21 DIAGNOSIS — I129 Hypertensive chronic kidney disease with stage 1 through stage 4 chronic kidney disease, or unspecified chronic kidney disease: Secondary | ICD-10-CM | POA: Diagnosis not present

## 2024-06-21 MED ORDER — LOSARTAN POTASSIUM 50 MG PO TABS: 50.0000 mg | ORAL_TABLET | Freq: Every day | ORAL | 1 refills | Status: AC

## 2024-06-21 MED ORDER — BUPROPION HCL ER (XL) 150 MG PO TB24: 150.0000 mg | ORAL_TABLET | Freq: Every day | ORAL | 1 refills | Status: AC

## 2024-06-21 MED ORDER — MIRTAZAPINE 15 MG PO TABS: 15.0000 mg | ORAL_TABLET | Freq: Every day | ORAL | 1 refills | Status: AC

## 2024-06-21 MED ORDER — ATORVASTATIN CALCIUM 40 MG PO TABS: 40.0000 mg | ORAL_TABLET | Freq: Every day | ORAL | 1 refills | Status: AC

## 2024-06-21 MED ORDER — TRAMADOL HCL 50 MG PO TABS: 50.0000 mg | ORAL_TABLET | Freq: Two times a day (BID) | ORAL | 2 refills | Status: AC | PRN

## 2024-06-21 MED ORDER — OMEPRAZOLE 20 MG PO CPDR: 20.0000 mg | DELAYED_RELEASE_CAPSULE | Freq: Every day | ORAL | 1 refills | Status: AC

## 2024-06-21 MED ORDER — QUETIAPINE FUMARATE 25 MG PO TABS: 25.0000 mg | ORAL_TABLET | Freq: Every day | ORAL | 1 refills | Status: AC

## 2024-06-21 MED ORDER — LORATADINE 10 MG PO TABS: 10.0000 mg | ORAL_TABLET | Freq: Every day | ORAL | 1 refills | Status: AC

## 2024-06-21 MED ORDER — TRAZODONE HCL 50 MG PO TABS: 25.0000 mg | ORAL_TABLET | Freq: Every evening | ORAL | 1 refills | Status: AC | PRN

## 2024-06-21 MED ORDER — AMLODIPINE BESYLATE 2.5 MG PO TABS: 2.5000 mg | ORAL_TABLET | Freq: Every day | ORAL | 1 refills | Status: AC

## 2024-06-21 MED ORDER — DULOXETINE HCL 60 MG PO CPEP: 60.0000 mg | ORAL_CAPSULE | Freq: Every day | ORAL | 1 refills | Status: AC

## 2024-06-21 NOTE — Assessment & Plan Note (Signed)
 Under good control on current regimen. Continue current regimen. Continue to monitor. Call with any concerns. Refills given. Labs drawn today.

## 2024-06-21 NOTE — Assessment & Plan Note (Signed)
 Rechecking labs today. Await results. Treat as needed.

## 2024-06-21 NOTE — Progress Notes (Signed)
 BP 134/76   Pulse 93   Temp 98 F (36.7 C) (Oral)   Ht 5' 1 (1.549 m)   LMP  (LMP Unknown)   SpO2 94%   BMI 34.54 kg/m    Subjective:    Patient ID: Cynthia Vaughan, female    DOB: 1941-03-19, 83 y.o.   MRN: [id number]  HPI: Cynthia Vaughan is a 83 y.o. female  Chief Complaint  Patient presents with   Anxiety   HYPERTENSION / HYPERLIPIDEMIA Satisfied with current treatment? yes Duration of hypertension: chronic BP monitoring frequency: not checking BP medication side effects: no Past BP meds: amlodipine , losartan  Duration of hyperlipidemia: chronic Cholesterol medication side effects: no Cholesterol supplements: none Past cholesterol medications: atorvastatin  Medication compliance: excellent Aspirin : yes Recent stressors: no Recurrent headaches: no Visual changes: no Palpitations: no Dyspnea: no Chest pain: no Lower extremity edema: no Dizzy/lightheaded: no  Chronic pain is about the same. Gait is worse. Has not seen neurology in about 18 months- will try to get back into see them  ANXIETY/DEPRESSION Duration: chronic Status:stable Anxious mood: yes  Excessive worrying: yes Irritability: yes  Sweating: no Nausea: no Palpitations:no Hyperventilation: no Panic attacks: no Agoraphobia: no  Obscessions/compulsions: no Depressed mood: no    06/21/2024    3:12 PM 03/20/2024    3:59 PM 12/19/2023    1:26 PM 09/14/2023    1:46 PM 07/28/2023    2:36 PM  Depression screen PHQ 2/9  Decreased Interest 2 3 2 2 2   Down, Depressed, Hopeless 1 2 1 2 1   PHQ - 2 Score 3 5 3 4 3   Altered sleeping 2 1 1 2 1   Tired, decreased energy 2 2 2 2 2   Change in appetite 2 2 2 2 1   Feeling bad or failure about yourself  2 1 1 1  0  Trouble concentrating 2 1 1 1 1   Moving slowly or fidgety/restless 0 0 1 0 0  Suicidal thoughts 0 1 1 0 0  PHQ-9 Score 13 13 12 12 8   Difficult doing work/chores Somewhat difficult Very difficult Very difficult Very difficult Somewhat  difficult   Anhedonia: no Weight changes: no Insomnia: no   Hypersomnia: no Fatigue/loss of energy: yes Feelings of worthlessness: no Feelings of guilt: no Impaired concentration/indecisiveness: no Suicidal ideations: no  Crying spells: no Recent Stressors/Life Changes: no   Relationship problems: no   Family stress: no     Financial stress: no    Job stress: no    Recent death/loss: no  Relevant past medical, surgical, family and social history reviewed and updated as indicated. Interim medical history since our last visit reviewed. Allergies and medications reviewed and updated.  Review of Systems  Constitutional: Negative.   Respiratory: Negative.    Cardiovascular: Negative.   Gastrointestinal: Negative.   Musculoskeletal:  Positive for arthralgias, back pain and gait problem. Negative for joint swelling, myalgias, neck pain and neck stiffness.  Skin: Negative.   Psychiatric/Behavioral: Negative.      Per HPI unless specifically indicated above     Objective:    BP 134/76   Pulse 93   Temp 98 F (36.7 C) (Oral)   Ht 5' 1 (1.549 m)   LMP  (LMP Unknown)   SpO2 94%   BMI 34.54 kg/m   Wt Readings from Last 3 Encounters:  05/20/24 182 lb 12.8 oz (82.9 kg)  03/20/24 182 lb 12.8 oz (82.9 kg)  12/19/23 187 lb 12.8 oz (85.2 kg)  Physical Exam Vitals and nursing note reviewed.  Constitutional:      General: She is not in acute distress.    Appearance: Normal appearance. She is not ill-appearing, toxic-appearing or diaphoretic.  HENT:     Head: Normocephalic and atraumatic.     Right Ear: External ear normal.     Left Ear: External ear normal.     Nose: Nose normal.     Mouth/Throat:     Mouth: Mucous membranes are moist.     Pharynx: Oropharynx is clear.  Eyes:     General: No scleral icterus.       Right eye: No discharge.        Left eye: No discharge.     Extraocular Movements: Extraocular movements intact.     Conjunctiva/sclera: Conjunctivae  normal.     Pupils: Pupils are equal, round, and reactive to light.  Cardiovascular:     Rate and Rhythm: Normal rate and regular rhythm.     Pulses: Normal pulses.     Heart sounds: Normal heart sounds. No murmur heard.    No friction rub. No gallop.  Pulmonary:     Effort: Pulmonary effort is normal. No respiratory distress.     Breath sounds: Normal breath sounds. No stridor. No wheezing, rhonchi or rales.  Chest:     Chest wall: No tenderness.  Musculoskeletal:        General: Normal range of motion.     Cervical back: Normal range of motion and neck supple.  Skin:    General: Skin is warm and dry.     Capillary Refill: Capillary refill takes less than 2 seconds.     Coloration: Skin is not jaundiced or pale.     Findings: No bruising, erythema, lesion or rash.  Neurological:     General: No focal deficit present.     Mental Status: She is alert and oriented to person, place, and time. Mental status is at baseline.  Psychiatric:        Mood and Affect: Mood normal.        Behavior: Behavior normal.        Thought Content: Thought content normal.        Judgment: Judgment normal.     Results for orders placed or performed in visit on 12/19/23  Bayer DCA Hb A1c Waived   Collection Time: 12/19/23  1:54 PM  Result Value Ref Range   HB A1C (BAYER DCA - WAIVED) 6.3 (H) 4.8 - 5.6 %  CBC with Differential/Platelet   Collection Time: 12/19/23  1:55 PM  Result Value Ref Range   WBC 6.1 3.4 - 10.8 x10E3/uL   RBC 4.48 3.77 - 5.28 x10E6/uL   Hemoglobin 14.2 11.1 - 15.9 g/dL   Hematocrit 57.0 65.9 - 46.6 %   MCV 96 79 - 97 fL   MCH 31.7 26.6 - 33.0 pg   MCHC 33.1 31.5 - 35.7 g/dL   RDW 87.3 88.2 - 84.5 %   Platelets 215 150 - 450 x10E3/uL   Neutrophils 59 Not Estab. %   Lymphs 27 Not Estab. %   Monocytes 8 Not Estab. %   Eos 4 Not Estab. %   Basos 1 Not Estab. %   Neutrophils Absolute 3.7 1.4 - 7.0 x10E3/uL   Lymphocytes Absolute 1.7 0.7 - 3.1 x10E3/uL   Monocytes  Absolute 0.5 0.1 - 0.9 x10E3/uL   EOS (ABSOLUTE) 0.2 0.0 - 0.4 x10E3/uL   Basophils Absolute 0.0 0.0 - 0.2  x10E3/uL   Immature Granulocytes 1 Not Estab. %   Immature Grans (Abs) 0.0 0.0 - 0.1 x10E3/uL  Comprehensive metabolic panel with GFR   Collection Time: 12/19/23  1:55 PM  Result Value Ref Range   Glucose 211 (H) 70 - 99 mg/dL   BUN 16 8 - 27 mg/dL   Creatinine, Ser 9.12 0.57 - 1.00 mg/dL   eGFR 67 >40 fO/fpw/8.26   BUN/Creatinine Ratio 18 12 - 28   Sodium 140 134 - 144 mmol/L   Potassium 4.4 3.5 - 5.2 mmol/L   Chloride 102 96 - 106 mmol/L   CO2 21 20 - 29 mmol/L   Calcium  9.5 8.7 - 10.3 mg/dL   Total Protein 6.8 6.0 - 8.5 g/dL   Albumin 4.3 3.7 - 4.7 g/dL   Globulin, Total 2.5 1.5 - 4.5 g/dL   Bilirubin Total 0.2 0.0 - 1.2 mg/dL   Alkaline Phosphatase 76 44 - 121 IU/L   AST 32 0 - 40 IU/L   ALT 33 (H) 0 - 32 IU/L  Lipid Panel w/o Chol/HDL Ratio   Collection Time: 12/19/23  1:55 PM  Result Value Ref Range   Cholesterol, Total 170 100 - 199 mg/dL   Triglycerides 801 (H) 0 - 149 mg/dL   HDL 64 >60 mg/dL   VLDL Cholesterol Cal 33 5 - 40 mg/dL   LDL Chol Calc (NIH) 73 0 - 99 mg/dL  TSH   Collection Time: 12/19/23  1:55 PM  Result Value Ref Range   TSH 5.270 (H) 0.450 - 4.500 uIU/mL  VITAMIN D  25 Hydroxy (Vit-D Deficiency, Fractures)   Collection Time: 12/19/23  1:55 PM  Result Value Ref Range   Vit D, 25-Hydroxy 39.5 30.0 - 100.0 ng/mL  Microalbumin, Urine Waived (STAT)   Collection Time: 12/21/23  1:46 PM  Result Value Ref Range   Microalb, Ur Waived 150 (H) 0 - 19 mg/L   Creatinine, Urine Waived 50 10 - 300 mg/dL   Microalb/Creat Ratio >300 (H) <30 mg/g      Assessment & Plan:   Problem List Items Addressed This Visit       Musculoskeletal and Integument   Osteoarthritis   Under good control on current regimen. Continue current regimen. Continue to monitor. Call with any concerns. Refills given today. Rx expected to last until next appt.       Relevant  Medications   traMADol  (ULTRAM ) 50 MG tablet     Genitourinary   Benign hypertensive renal disease   Under good control on current regimen. Continue current regimen. Continue to monitor. Call with any concerns. Refills given. Labs drawn today.       Relevant Orders   CBC with Differential/Platelet   Comprehensive metabolic panel with GFR     Other   Anxiety - Primary   Under good control on current regimen. Continue current regimen. Continue to monitor. Call with any concerns. Refills given. Labs drawn today.        Relevant Medications   traZODone  (DESYREL ) 50 MG tablet   buPROPion  (WELLBUTRIN  XL) 150 MG 24 hr tablet   DULoxetine  (CYMBALTA ) 60 MG capsule   mirtazapine  (REMERON ) 15 MG tablet   Other Relevant Orders   CBC with Differential/Platelet   Comprehensive metabolic panel with GFR   Hyperlipidemia   Under good control on current regimen. Continue current regimen. Continue to monitor. Call with any concerns. Refills given. Labs drawn today.       Relevant Medications   amLODipine  (NORVASC ) 2.5  MG tablet   atorvastatin  (LIPITOR) 40 MG tablet   losartan  (COZAAR ) 50 MG tablet   Other Relevant Orders   CBC with Differential/Platelet   Comprehensive metabolic panel with GFR   Lipid Panel w/o Chol/HDL Ratio   Vitamin D  deficiency disease   Rechecking labs today. Await results. Treat as needed.       Relevant Orders   CBC with Differential/Platelet   Comprehensive metabolic panel with GFR   MDD (major depressive disorder), recurrent episode, moderate (HCC)   Under good control on current regimen. Continue current regimen. Continue to monitor. Call with any concerns. Refills given. Labs drawn today.       Relevant Medications   traZODone  (DESYREL ) 50 MG tablet   buPROPion  (WELLBUTRIN  XL) 150 MG 24 hr tablet   DULoxetine  (CYMBALTA ) 60 MG capsule   mirtazapine  (REMERON ) 15 MG tablet   Other Relevant Orders   CBC with Differential/Platelet   Comprehensive metabolic  panel with GFR     Follow up plan: Return in about 4 months (around 10/19/2024).

## 2024-06-21 NOTE — Assessment & Plan Note (Signed)
 Under good control on current regimen. Continue current regimen. Continue to monitor. Call with any concerns. Refills given today. Rx expected to last until next appt.

## 2024-06-22 LAB — COMPREHENSIVE METABOLIC PANEL WITH GFR
ALT: 42 IU/L — ABNORMAL HIGH (ref 0–32)
AST: 39 IU/L (ref 0–40)
Albumin: 4.2 g/dL (ref 3.7–4.7)
Alkaline Phosphatase: 74 IU/L (ref 48–129)
BUN/Creatinine Ratio: 21 (ref 12–28)
BUN: 17 mg/dL (ref 8–27)
Bilirubin Total: 0.3 mg/dL (ref 0.0–1.2)
CO2: 22 mmol/L (ref 20–29)
Calcium: 9.5 mg/dL (ref 8.7–10.3)
Chloride: 102 mmol/L (ref 96–106)
Creatinine, Ser: 0.82 mg/dL (ref 0.57–1.00)
Globulin, Total: 2.8 g/dL (ref 1.5–4.5)
Glucose: 174 mg/dL — ABNORMAL HIGH (ref 70–99)
Potassium: 4.3 mmol/L (ref 3.5–5.2)
Sodium: 138 mmol/L (ref 134–144)
Total Protein: 7 g/dL (ref 6.0–8.5)
eGFR: 71 mL/min/1.73 (ref >59)

## 2024-06-22 LAB — CBC WITH DIFFERENTIAL/PLATELET
Basophils Absolute: 0 x10E3/uL (ref 0.0–0.2)
Basos: 1 %
EOS (ABSOLUTE): 0.3 x10E3/uL (ref 0.0–0.4)
Eos: 5 %
Hematocrit: 41.8 % (ref 34.0–46.6)
Hemoglobin: 14.1 g/dL (ref 11.1–15.9)
Immature Grans (Abs): 0.1 x10E3/uL (ref 0.0–0.1)
Immature Granulocytes: 1 %
Lymphocytes Absolute: 1.8 x10E3/uL (ref 0.7–3.1)
Lymphs: 29 %
MCH: 33.2 pg — ABNORMAL HIGH (ref 26.6–33.0)
MCHC: 33.7 g/dL (ref 31.5–35.7)
MCV: 98 fL — ABNORMAL HIGH (ref 79–97)
Monocytes Absolute: 0.6 x10E3/uL (ref 0.1–0.9)
Monocytes: 10 %
Neutrophils Absolute: 3.3 x10E3/uL (ref 1.4–7.0)
Neutrophils: 54 %
Platelets: 192 x10E3/uL (ref 150–450)
RBC: 4.25 x10E6/uL (ref 3.77–5.28)
RDW: 11.7 % (ref 11.7–15.4)
WBC: 6 x10E3/uL (ref 3.4–10.8)

## 2024-06-22 LAB — LIPID PANEL W/O CHOL/HDL RATIO
Cholesterol, Total: 168 mg/dL (ref 100–199)
HDL: 68 mg/dL (ref >39)
LDL Chol Calc (NIH): 67 mg/dL (ref 0–99)
Triglycerides: 207 mg/dL — ABNORMAL HIGH (ref 0–149)
VLDL Cholesterol Cal: 33 mg/dL (ref 5–40)

## 2024-06-27 ENCOUNTER — Ambulatory Visit: Payer: Self-pay | Admitting: Family Medicine

## 2024-07-10 DIAGNOSIS — G20A1 Parkinson's disease without dyskinesia, without mention of fluctuations: Secondary | ICD-10-CM | POA: Diagnosis not present

## 2024-07-10 DIAGNOSIS — R413 Other amnesia: Secondary | ICD-10-CM | POA: Diagnosis not present

## 2024-07-10 DIAGNOSIS — R2689 Other abnormalities of gait and mobility: Secondary | ICD-10-CM | POA: Diagnosis not present

## 2024-07-10 DIAGNOSIS — G479 Sleep disorder, unspecified: Secondary | ICD-10-CM | POA: Diagnosis not present

## 2024-07-10 DIAGNOSIS — F0390 Unspecified dementia without behavioral disturbance: Secondary | ICD-10-CM | POA: Diagnosis not present

## 2024-07-30 ENCOUNTER — Encounter: Payer: Self-pay | Admitting: Dermatology

## 2024-07-30 ENCOUNTER — Ambulatory Visit: Payer: Medicare Other | Admitting: Dermatology

## 2024-07-30 DIAGNOSIS — S50312A Abrasion of left elbow, initial encounter: Secondary | ICD-10-CM | POA: Diagnosis not present

## 2024-07-30 DIAGNOSIS — W908XXA Exposure to other nonionizing radiation, initial encounter: Secondary | ICD-10-CM | POA: Diagnosis not present

## 2024-07-30 DIAGNOSIS — L821 Other seborrheic keratosis: Secondary | ICD-10-CM | POA: Diagnosis not present

## 2024-07-30 DIAGNOSIS — L738 Other specified follicular disorders: Secondary | ICD-10-CM

## 2024-07-30 DIAGNOSIS — L820 Inflamed seborrheic keratosis: Secondary | ICD-10-CM

## 2024-07-30 DIAGNOSIS — L814 Other melanin hyperpigmentation: Secondary | ICD-10-CM

## 2024-07-30 DIAGNOSIS — L578 Other skin changes due to chronic exposure to nonionizing radiation: Secondary | ICD-10-CM | POA: Diagnosis not present

## 2024-07-30 DIAGNOSIS — L300 Nummular dermatitis: Secondary | ICD-10-CM | POA: Diagnosis not present

## 2024-07-30 DIAGNOSIS — Z86018 Personal history of other benign neoplasm: Secondary | ICD-10-CM

## 2024-07-30 DIAGNOSIS — D229 Melanocytic nevi, unspecified: Secondary | ICD-10-CM

## 2024-07-30 DIAGNOSIS — D3612 Benign neoplasm of peripheral nerves and autonomic nervous system, upper limb, including shoulder: Secondary | ICD-10-CM | POA: Diagnosis not present

## 2024-07-30 DIAGNOSIS — D1801 Hemangioma of skin and subcutaneous tissue: Secondary | ICD-10-CM

## 2024-07-30 DIAGNOSIS — Z85828 Personal history of other malignant neoplasm of skin: Secondary | ICD-10-CM

## 2024-07-30 DIAGNOSIS — Z1283 Encounter for screening for malignant neoplasm of skin: Secondary | ICD-10-CM | POA: Diagnosis not present

## 2024-07-30 DIAGNOSIS — D3610 Benign neoplasm of peripheral nerves and autonomic nervous system, unspecified: Secondary | ICD-10-CM

## 2024-07-30 MED ORDER — MOMETASONE FUROATE 0.1 % EX CREA: TOPICAL_CREAM | CUTANEOUS | 2 refills | Status: AC

## 2024-07-30 NOTE — Patient Instructions (Addendum)
 Start Mometasone  cream once or twice daily to affected areas on back and chest as needed for itching. Avoid applying to face, groin, and axilla. Use as directed. Long-term use can cause thinning of the skin.  Topical steroids (such as triamcinolone , fluocinolone, fluocinonide, mometasone , clobetasol, halobetasol, betamethasone , hydrocortisone) can cause thinning and lightening of the skin if they are used for too long in the same area. Your physician has selected the right strength medicine for your problem and area affected on the body. Please use your medication only as directed by your physician to prevent side effects.      Recommend daily broad spectrum sunscreen SPF 30+ to sun-exposed areas, reapply every 2 hours as needed. Call for new or changing lesions.  Staying in the shade or wearing long sleeves, sun glasses (UVA+UVB protection) and wide brim hats (4-inch brim around the entire circumference of the hat) are also recommended for sun protection.      Melanoma ABCDEs  Melanoma is the most dangerous type of skin cancer, and is the leading cause of death from skin disease.  You are more likely to develop melanoma if you: Have light-colored skin, light-colored eyes, or red or blond hair Spend a lot of time in the sun Tan regularly, either outdoors or in a tanning bed Have had blistering sunburns, especially during childhood Have a close family member who has had a melanoma Have atypical moles or large birthmarks  Early detection of melanoma is key since treatment is typically straightforward and cure rates are extremely high if we catch it early.   The first sign of melanoma is often a change in a mole or a new dark spot.  The ABCDE system is a way of remembering the signs of melanoma.  A for asymmetry:  The two halves do not match. B for border:  The edges of the growth are irregular. C for color:  A mixture of colors are present instead of an even brown color. D for diameter:   Melanomas are usually (but not always) greater than 6mm - the size of a pencil eraser. E for evolution:  The spot keeps changing in size, shape, and color.  Please check your skin once per month between visits. You can use a small mirror in front and a large mirror behind you to keep an eye on the back side or your body.   If you see any new or changing lesions before your next follow-up, please call to schedule a visit.  Please continue daily skin protection including broad spectrum sunscreen SPF 30+ to sun-exposed areas, reapplying every 2 hours as needed when you're outdoors.   Staying in the shade or wearing long sleeves, sun glasses (UVA+UVB protection) and wide brim hats (4-inch brim around the entire circumference of the hat) are also recommended for sun protection.      Due to recent changes in healthcare laws, you may see results of your pathology and/or laboratory studies on MyChart before the doctors have had a chance to review them. We understand that in some cases there may be results that are confusing or concerning to you. Please understand that not all results are received at the same time and often the doctors may need to interpret multiple results in order to provide you with the best plan of care or course of treatment. Therefore, we ask that you please give us  2 business days to thoroughly review all your results before contacting the office for clarification. Should we see a critical  lab result, you will be contacted sooner.   If You Need Anything After Your Visit  If you have any questions or concerns for your doctor, please call our main line at [phone] and press option 4 to reach your doctor's medical assistant. If no one answers, please leave a voicemail as directed and we will return your call as soon as possible. Messages left after 4 pm will be answered the following business day.   You may also send us  a message via MyChart. We typically respond to MyChart  messages within 1-2 business days.  For prescription refills, please ask your pharmacy to contact our office. Our fax number is [phone].  If you have an urgent issue when the clinic is closed that cannot wait until the next business day, you can page your doctor at the number below.    Please note that while we do our best to be available for urgent issues outside of office hours, we are not available 24/7.   If you have an urgent issue and are unable to reach us , you may choose to seek medical care at your doctor's office, retail clinic, urgent care center, or emergency room.  If you have a medical emergency, please immediately call 911 or go to the emergency department.  Pager Numbers  - Dr. Hester: [phone]  - Dr. Jackquline: [phone]  - Dr. Claudene: [phone]   - Dr. Raymund: [phone]  In the event of inclement weather, please call our main line at [phone] for an update on the status of any delays or closures.  Dermatology Medication Tips: Please keep the boxes that topical medications come in in order to help keep track of the instructions about where and how to use these. Pharmacies typically print the medication instructions only on the boxes and not directly on the medication tubes.   If your medication is too expensive, please contact our office at [phone] option 4 or send us  a message through MyChart.   We are unable to tell what your co-pay for medications will be in advance as this is different depending on your insurance coverage. However, we may be able to find a substitute medication at lower cost or fill out paperwork to get insurance to cover a needed medication.   If a prior authorization is required to get your medication covered by your insurance company, please allow us  1-2 business days to complete this process.  Drug prices often vary depending on where the prescription is filled and some pharmacies may offer cheaper prices.  The  website www.goodrx.com contains coupons for medications through different pharmacies. The prices here do not account for what the cost may be with help from insurance (it may be cheaper with your insurance), but the website can give you the price if you did not use any insurance.  - You can print the associated coupon and take it with your prescription to the pharmacy.  - You may also stop by our office during regular business hours and pick up a GoodRx coupon card.  - If you need your prescription sent electronically to a different pharmacy, notify our office through [facility] or by phone at [phone] option 4.     Si Usted Necesita Algo Despus de Su Visita  Tambin puede enviarnos un mensaje a travs de Clinical Cytogeneticist. Por lo general respondemos a los mensajes de MyChart en el transcurso de 1 a 2 das hbiles.  Para renovar recetas, por favor pida a su farmacia que se  ponga en contacto con nuestra oficina. Randi lakes de fax es [place] [phone].  Si tiene un asunto urgente cuando la clnica est cerrada y que no puede esperar hasta el siguiente da hbil, puede llamar/localizar a su doctor(a) al nmero que aparece a continuacin.   Por favor, tenga en cuenta que aunque hacemos todo lo posible para estar disponibles para asuntos urgentes fuera del horario de [place], no estamos disponibles las 24 horas del da, los 7 [address]  [phone] de la [place].   Si tiene un problema urgente y no puede comunicarse con nosotros, puede optar por buscar atencin mdica  en el consultorio de su doctor(a), en una clnica privada, en un centro de atencin urgente o en una sala de emergencias.  Si tiene engineer, drilling, por favor llame inmediatamente al 911 o vaya a la sala de emergencias.  Nmeros de bper  - Dr. Hester: [phone]  - Dra. Jackquline: [phone]  - Dr. Claudene: [phone]  - Dra. Kitts: [phone]  En caso de inclemencias del [place], por favor llame a nuestra lnea principal al  [phone] para una actualizacin sobre el estado de cualquier retraso o cierre.  Consejos para la medicacin en dermatologa: Por favor, guarde las cajas en las que vienen los medicamentos de uso tpico para ayudarle a seguir las instrucciones sobre dnde y cmo usarlos. Las farmacias generalmente imprimen las instrucciones del medicamento slo en las cajas y no directamente en los tubos del [place].   Si su medicamento es muy caro, por favor, pngase en contacto con landry rieger llamando al [phone] y presione la opcin 4 o envenos un mensaje a travs de Clinical Cytogeneticist.   No podemos decirle cul ser su copago por los medicamentos por adelantado ya que esto es diferente dependiendo de la cobertura de su seguro. Sin embargo, es posible que podamos encontrar un medicamento sustituto a audiological scientist un formulario para que el seguro cubra el medicamento que se considera necesario.   Si se requiere una autorizacin previa para que su compaa de seguros cubra su medicamento, por favor permtanos de 1 a 2 das hbiles para completar este proceso.  Los precios de los medicamentos varan con frecuencia dependiendo del environmental consultant de dnde se surte la receta y alguna farmacias pueden ofrecer precios ms baratos.  El sitio web www.goodrx.com tiene cupones para medicamentos de health and safety inspector. Los precios aqu no tienen en cuenta lo que podra costar con la ayuda del seguro (puede ser ms barato con su seguro), pero el sitio web puede darle el precio si no utiliz tourist information centre manager.  - Puede imprimir el cupn correspondiente y llevarlo con su receta a la farmacia.  - Tambin puede pasar por nuestra oficina durante el horario de atencin regular y education officer, museum una tarjeta de cupones de GoodRx.  - Si necesita que su receta se enve electrnicamente a una farmacia diferente, informe a nuestra oficina a travs de MyChart de [place] o por telfono llamando al [phone] y presione la opcin 4.

## 2024-07-30 NOTE — Progress Notes (Signed)
 Follow-Up Visit   Subjective  Cynthia Vaughan is a 83 y.o. female who presents for the following: Skin Cancer Screening and Upper Body Skin Exam. Hx of AMP. Hx of BCCs.   C/O itching on back and chest.   The patient presents for Upper Body Skin Exam (UBSE) for skin cancer screening and mole check. The patient has spots, moles and lesions to be evaluated, some may be new or changing and the patient may have concern these could be cancer.  Husband, Cynthia Vaughan, is with patient and contributes to history.   The following portions of the chart were reviewed this encounter and updated as appropriate: medications, allergies, medical history  Review of Systems:  No other skin or systemic complaints except as noted in HPI or Assessment and Plan.  Objective  Well appearing patient in no apparent distress; mood and affect are within normal limits.  All skin waist up examined. Relevant physical exam findings are noted in the Assessment and Plan.  Right Lower Back Erythematous keratotic or waxy stuck-on papule or plaque.  Assessment & Plan   INFLAMED SEBORRHEIC KERATOSIS Right Lower Back Discussed cryotherapy.  Patient deferred treatment at this time.  Skin cancer screening performed today.  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  - Call if any new or changing lesions are noted between office visits   History of Basal Cell Carcinoma of the Skin - No evidence of recurrence today- R infra orbital, R lat infraorbital - Recommend regular full body skin exams - Recommend daily broad spectrum sunscreen SPF 30+ to sun-exposed areas, reapply every 2 hours as needed.  - Call if any new or changing lesions are noted between office visits   Actinic Damage - Chronic condition,  secondary to cumulative UV/sun exposure - diffuse scaly erythematous macules with underlying dyspigmentation - Recommend daily broad spectrum sunscreen SPF 30+ to sun-exposed areas, reapply every 2 hours as needed.  - Staying in the shade or wearing long sleeves, sun glasses (UVA+UVB protection) and wide brim hats (4-inch brim around the entire circumference of the hat) are also recommended for sun protection.  - Call for new or changing lesions.  Lentigines, Seborrheic Keratoses, Hemangiomas - Benign normal skin lesions - Benign-appearing - Call for any changes  Melanocytic Nevi - Tan-brown and/or pink-flesh-colored symmetric macules and papules - Benign appearing on exam today - Observation - Call clinic for new or changing moles - Recommend daily use of broad spectrum spf 30+ sunscreen to sun-exposed areas.   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 Condition is bothersome/symptomatic for patient. Currently flared.   Nummular dermatitis (eczema) is a chronic, relapsing, pruritic condition that can significantly affect quality of life. It is often associated with dry skin can require treatment with prescription topical medications, in addition to dry skin care.  If there is underlying atopic dermatitis and topicals are not working, then biologic injections may be necessary to control symptoms.   Treatment Plan: Start Mometasone  cream once or twice daily to affected areas on back and chest as needed for itching. Avoid applying to face, groin, and axilla. Use as directed. Long-term use can cause thinning of the skin.  Topical steroids (such as triamcinolone , fluocinolone, fluocinonide, mometasone , clobetasol, halobetasol, betamethasone , hydrocortisone) can cause thinning and lightening of the skin if they are used for  too long in the same area. Your physician has selected the right strength medicine for your problem and area affected on the body. Please use your medication only as directed by your physician to prevent side effects.   Recommend mild soap and moisturizing cream 1-2 times daily.  Gentle skin care handout provided.    EXCORIATION Exam: Excoriation at left upper elbow  Treatment Plan: Recommend vaseline. Call if not resolving in 6-8 weeks.  Sebaceous Hyperplasia - Small yellow papules with a central dell face - Benign-appearing - Observe. Call for changes.   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   Treatment: Benign-appearing.  Observation.  Call clinic for new or changing lesions.  Recommend daily use of broad spectrum spf 30+ sunscreen to sun-exposed areas.     Return in about 1 year (around 07/30/2025) for UBSE, HxAMP, HxBCC.  I, Jill Parcell, CMA, am acting as scribe for Rexene Rattler, MD.   Documentation: I have reviewed the above documentation for accuracy and completeness, and I agree with the above.  Rexene Rattler, MD

## 2024-10-21 ENCOUNTER — Ambulatory Visit: Admitting: Family Medicine

## 2025-08-05 ENCOUNTER — Ambulatory Visit: Admitting: Dermatology
